# Patient Record
Sex: Male | Born: 1940 | Race: White | Hispanic: No | Marital: Married | Smoking: Former smoker
Health system: Southern US, Community
[De-identification: ages and names within clinical notes are randomized; demographics above are authoritative.]

## PROBLEM LIST (undated history)

## (undated) DIAGNOSIS — G8929 Other chronic pain: Secondary | ICD-10-CM

## (undated) DIAGNOSIS — R269 Unspecified abnormalities of gait and mobility: Secondary | ICD-10-CM

## (undated) DIAGNOSIS — G2581 Restless legs syndrome: Secondary | ICD-10-CM

## (undated) DIAGNOSIS — R413 Other amnesia: Secondary | ICD-10-CM

## (undated) DIAGNOSIS — I499 Cardiac arrhythmia, unspecified: Secondary | ICD-10-CM

## (undated) DIAGNOSIS — F0390 Unspecified dementia without behavioral disturbance: Secondary | ICD-10-CM

## (undated) DIAGNOSIS — E7800 Pure hypercholesterolemia, unspecified: Secondary | ICD-10-CM

## (undated) DIAGNOSIS — C9590 Leukemia, unspecified not having achieved remission: Secondary | ICD-10-CM

## (undated) DIAGNOSIS — K635 Polyp of colon: Secondary | ICD-10-CM

## (undated) DIAGNOSIS — I4891 Unspecified atrial fibrillation: Secondary | ICD-10-CM

## (undated) DIAGNOSIS — K219 Gastro-esophageal reflux disease without esophagitis: Secondary | ICD-10-CM

## (undated) DIAGNOSIS — J449 Chronic obstructive pulmonary disease, unspecified: Secondary | ICD-10-CM

## (undated) DIAGNOSIS — I10 Essential (primary) hypertension: Secondary | ICD-10-CM

## (undated) DIAGNOSIS — M545 Low back pain: Secondary | ICD-10-CM

## (undated) DIAGNOSIS — G629 Polyneuropathy, unspecified: Secondary | ICD-10-CM

## (undated) HISTORY — DX: Polyneuropathy, unspecified: G62.9

## (undated) HISTORY — PX: OTHER SURGICAL HISTORY: SHX169

## (undated) HISTORY — DX: Other chronic pain: G89.29

## (undated) HISTORY — DX: Low back pain: M54.5

## (undated) HISTORY — PX: HERNIA REPAIR: SHX51

## (undated) HISTORY — DX: Unspecified abnormalities of gait and mobility: R26.9

## (undated) HISTORY — DX: Unspecified atrial fibrillation: I48.91

## (undated) HISTORY — PX: COLONOSCOPY W/ BIOPSIES: SHX1374

## (undated) HISTORY — PX: APPENDECTOMY: SHX54

## (undated) HISTORY — DX: Restless legs syndrome: G25.81

## (undated) HISTORY — PX: CATARACT EXTRACTION: SUR2

## (undated) HISTORY — DX: Polyp of colon: K63.5

## (undated) HISTORY — PX: TONSILLECTOMY: SUR1361

## (undated) HISTORY — DX: Other amnesia: R41.3

---

## 1970-05-22 HISTORY — PX: OTHER SURGICAL HISTORY: SHX169

## 1988-05-22 HISTORY — PX: BONE MARROW TRANSPLANT: SHX200

## 1998-05-03 ENCOUNTER — Ambulatory Visit (HOSPITAL_BASED_OUTPATIENT_CLINIC_OR_DEPARTMENT_OTHER): Admission: RE | Admit: 1998-05-03 | Discharge: 1998-05-03 | Payer: Self-pay | Admitting: General Surgery

## 1999-09-22 ENCOUNTER — Encounter: Payer: Self-pay | Admitting: Oncology

## 1999-09-22 ENCOUNTER — Inpatient Hospital Stay (HOSPITAL_COMMUNITY): Admission: AD | Admit: 1999-09-22 | Discharge: 1999-09-27 | Payer: Self-pay | Admitting: Oncology

## 1999-09-22 ENCOUNTER — Encounter: Admission: RE | Admit: 1999-09-22 | Discharge: 1999-09-22 | Payer: Self-pay | Admitting: Oncology

## 1999-09-26 ENCOUNTER — Encounter: Payer: Self-pay | Admitting: Oncology

## 1999-11-06 ENCOUNTER — Encounter: Payer: Self-pay | Admitting: Emergency Medicine

## 1999-11-06 ENCOUNTER — Emergency Department (HOSPITAL_COMMUNITY): Admission: EM | Admit: 1999-11-06 | Discharge: 1999-11-06 | Payer: Self-pay | Admitting: Emergency Medicine

## 2000-01-01 ENCOUNTER — Inpatient Hospital Stay (HOSPITAL_COMMUNITY): Admission: EM | Admit: 2000-01-01 | Discharge: 2000-01-06 | Payer: Self-pay | Admitting: Emergency Medicine

## 2000-01-01 ENCOUNTER — Encounter: Payer: Self-pay | Admitting: Emergency Medicine

## 2000-01-02 ENCOUNTER — Encounter: Payer: Self-pay | Admitting: Oncology

## 2000-01-05 ENCOUNTER — Encounter: Payer: Self-pay | Admitting: Oncology

## 2000-01-18 ENCOUNTER — Ambulatory Visit (HOSPITAL_COMMUNITY): Admission: RE | Admit: 2000-01-18 | Discharge: 2000-01-18 | Payer: Self-pay | Admitting: Oncology

## 2000-01-18 ENCOUNTER — Encounter: Payer: Self-pay | Admitting: Emergency Medicine

## 2000-01-18 ENCOUNTER — Observation Stay (HOSPITAL_COMMUNITY): Admission: EM | Admit: 2000-01-18 | Discharge: 2000-01-20 | Payer: Self-pay | Admitting: Emergency Medicine

## 2000-01-18 ENCOUNTER — Encounter: Payer: Self-pay | Admitting: Oncology

## 2000-01-20 ENCOUNTER — Encounter: Payer: Self-pay | Admitting: Oncology

## 2000-02-02 ENCOUNTER — Encounter: Payer: Self-pay | Admitting: Oncology

## 2000-02-02 ENCOUNTER — Ambulatory Visit (HOSPITAL_COMMUNITY): Admission: RE | Admit: 2000-02-02 | Discharge: 2000-02-02 | Payer: Self-pay | Admitting: Oncology

## 2000-08-28 ENCOUNTER — Ambulatory Visit (HOSPITAL_COMMUNITY): Admission: RE | Admit: 2000-08-28 | Discharge: 2000-08-28 | Payer: Self-pay | Admitting: Oncology

## 2000-08-28 ENCOUNTER — Encounter: Payer: Self-pay | Admitting: Oncology

## 2000-12-05 ENCOUNTER — Ambulatory Visit (HOSPITAL_COMMUNITY): Admission: RE | Admit: 2000-12-05 | Discharge: 2000-12-05 | Payer: Self-pay | Admitting: Neurosurgery

## 2000-12-05 ENCOUNTER — Encounter: Payer: Self-pay | Admitting: Neurosurgery

## 2000-12-19 ENCOUNTER — Encounter: Admission: RE | Admit: 2000-12-19 | Discharge: 2000-12-19 | Payer: Self-pay | Admitting: Neurosurgery

## 2000-12-19 ENCOUNTER — Encounter: Payer: Self-pay | Admitting: Neurosurgery

## 2001-01-16 ENCOUNTER — Encounter: Payer: Self-pay | Admitting: Neurosurgery

## 2001-01-16 ENCOUNTER — Encounter: Admission: RE | Admit: 2001-01-16 | Discharge: 2001-01-16 | Payer: Self-pay | Admitting: Neurosurgery

## 2002-06-20 ENCOUNTER — Emergency Department (HOSPITAL_COMMUNITY): Admission: EM | Admit: 2002-06-20 | Discharge: 2002-06-20 | Payer: Self-pay

## 2002-11-27 ENCOUNTER — Ambulatory Visit (HOSPITAL_BASED_OUTPATIENT_CLINIC_OR_DEPARTMENT_OTHER): Admission: RE | Admit: 2002-11-27 | Discharge: 2002-11-27 | Payer: Self-pay | Admitting: General Surgery

## 2003-02-12 ENCOUNTER — Encounter: Payer: Self-pay | Admitting: Cardiology

## 2003-02-12 ENCOUNTER — Encounter: Admission: RE | Admit: 2003-02-12 | Discharge: 2003-02-12 | Payer: Self-pay | Admitting: Cardiology

## 2003-04-20 ENCOUNTER — Inpatient Hospital Stay (HOSPITAL_COMMUNITY): Admission: EM | Admit: 2003-04-20 | Discharge: 2003-04-24 | Payer: Self-pay | Admitting: Emergency Medicine

## 2004-08-29 ENCOUNTER — Ambulatory Visit: Payer: Self-pay | Admitting: Oncology

## 2010-09-20 ENCOUNTER — Other Ambulatory Visit: Payer: Self-pay | Admitting: Cardiology

## 2010-09-22 ENCOUNTER — Ambulatory Visit
Admission: RE | Admit: 2010-09-22 | Discharge: 2010-09-22 | Disposition: A | Discharge status: Home / Self Care | Payer: Medicare Other | Source: Ambulatory Visit | Attending: Cardiology | Admitting: Cardiology

## 2011-07-14 DIAGNOSIS — L723 Sebaceous cyst: Secondary | ICD-10-CM | POA: Diagnosis not present

## 2011-07-14 DIAGNOSIS — D239 Other benign neoplasm of skin, unspecified: Secondary | ICD-10-CM | POA: Diagnosis not present

## 2011-07-14 DIAGNOSIS — Z85828 Personal history of other malignant neoplasm of skin: Secondary | ICD-10-CM | POA: Diagnosis not present

## 2011-07-26 DIAGNOSIS — I10 Essential (primary) hypertension: Secondary | ICD-10-CM | POA: Diagnosis not present

## 2011-07-26 DIAGNOSIS — N401 Enlarged prostate with lower urinary tract symptoms: Secondary | ICD-10-CM | POA: Diagnosis not present

## 2011-07-26 DIAGNOSIS — M542 Cervicalgia: Secondary | ICD-10-CM | POA: Diagnosis not present

## 2011-07-26 DIAGNOSIS — R413 Other amnesia: Secondary | ICD-10-CM | POA: Diagnosis not present

## 2011-08-15 DIAGNOSIS — M542 Cervicalgia: Secondary | ICD-10-CM | POA: Diagnosis not present

## 2011-08-15 DIAGNOSIS — M5412 Radiculopathy, cervical region: Secondary | ICD-10-CM | POA: Diagnosis not present

## 2011-08-16 ENCOUNTER — Other Ambulatory Visit (HOSPITAL_COMMUNITY): Payer: Self-pay | Admitting: Neurosurgery

## 2011-08-16 ENCOUNTER — Other Ambulatory Visit: Payer: Self-pay | Admitting: Neurosurgery

## 2011-08-16 DIAGNOSIS — M542 Cervicalgia: Secondary | ICD-10-CM

## 2011-08-28 DIAGNOSIS — E785 Hyperlipidemia, unspecified: Secondary | ICD-10-CM | POA: Diagnosis not present

## 2011-08-28 DIAGNOSIS — I4891 Unspecified atrial fibrillation: Secondary | ICD-10-CM | POA: Diagnosis not present

## 2011-08-28 DIAGNOSIS — J449 Chronic obstructive pulmonary disease, unspecified: Secondary | ICD-10-CM | POA: Diagnosis not present

## 2011-08-28 DIAGNOSIS — I10 Essential (primary) hypertension: Secondary | ICD-10-CM | POA: Diagnosis not present

## 2011-08-28 DIAGNOSIS — M542 Cervicalgia: Secondary | ICD-10-CM | POA: Diagnosis not present

## 2011-08-30 ENCOUNTER — Ambulatory Visit (HOSPITAL_COMMUNITY)
Admission: RE | Admit: 2011-08-30 | Discharge: 2011-08-30 | Disposition: A | Discharge status: Home / Self Care | Payer: Medicare Other | Source: Ambulatory Visit | Attending: Neurosurgery | Admitting: Neurosurgery

## 2011-08-30 DIAGNOSIS — M5412 Radiculopathy, cervical region: Secondary | ICD-10-CM | POA: Diagnosis not present

## 2011-08-30 DIAGNOSIS — M542 Cervicalgia: Secondary | ICD-10-CM | POA: Diagnosis not present

## 2011-08-30 DIAGNOSIS — M47812 Spondylosis without myelopathy or radiculopathy, cervical region: Secondary | ICD-10-CM | POA: Insufficient documentation

## 2011-08-30 DIAGNOSIS — M5030 Other cervical disc degeneration, unspecified cervical region: Secondary | ICD-10-CM | POA: Diagnosis not present

## 2011-08-30 DIAGNOSIS — R209 Unspecified disturbances of skin sensation: Secondary | ICD-10-CM | POA: Diagnosis not present

## 2011-08-30 MED ORDER — IOHEXOL 300 MG/ML  SOLN
10.0000 mL | Freq: Once | INTRAMUSCULAR | Status: AC | PRN
  Administered 2011-08-30: 10 mL via INTRATHECAL

## 2011-08-30 MED ORDER — OXYCODONE-ACETAMINOPHEN 5-325 MG PO TABS
ORAL_TABLET | ORAL | Status: AC
  Filled 2011-08-30: qty 1

## 2011-08-30 MED ORDER — ONDANSETRON HCL 4 MG/2ML IJ SOLN: 4.0000 mg | Freq: Four times a day (QID) | INTRAMUSCULAR | Status: DC | PRN

## 2011-08-30 MED ORDER — DIAZEPAM 5 MG PO TABS
10.0000 mg | ORAL_TABLET | Freq: Once | ORAL | Status: AC
  Administered 2011-08-30: 10 mg via ORAL

## 2011-08-30 MED ORDER — OXYCODONE-ACETAMINOPHEN 5-325 MG PO TABS
1.0000 | ORAL_TABLET | ORAL | Status: DC | PRN
  Administered 2011-08-30: 1 via ORAL

## 2011-08-30 MED ORDER — DIAZEPAM 5 MG PO TABS
ORAL_TABLET | ORAL | Status: AC
  Filled 2011-08-30: qty 2

## 2011-08-30 MED ORDER — ALBUTEROL SULFATE HFA 108 (90 BASE) MCG/ACT IN AERS: 2.0000 | INHALATION_SPRAY | Freq: Four times a day (QID) | RESPIRATORY_TRACT | Status: DC | PRN
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
## 2011-08-30 NOTE — Discharge Instructions (Signed)
Myelography Care After These instructions give you information on caring for yourself after your procedure. Your doctor may also give you specific instructions. Call your doctor if you have any problems or questions after your procedure. HOME CARE  Lie down for 24 hours. Lie in any position with 1 pillow under your head.   For 24 hours, get up only to eat or use the bathroom. Take only 10 minutes to eat.   For 24 hours, drink enough fluids to keep your pee (urine) clear or pale yellow. No alcohol.   Take all medicine as told by your doctor.   Avoid heavy lifting and activity for 48 hours.   You may take the bandage off the day after your myelography.   Do not take a bath for 24 hours. Ask your doctor if it is okay to take a shower.  Finding out the results of your test Ask your doctor when your test results will be ready. Make sure you follow up and get the test results. GET HELP RIGHT AWAY IF:   Any of the places where the needles were put in:   Are puffy (swollen) or red.   Are sore or hot to the touch.   Are draining yellowish-white fluid (pus).   Are bleeding after 10 minutes of pressing down on the site. Have someone press on any place that is bleeding until seen by a doctor.   You have a lasting headache that is not helped by medicine.   You have a bad headache with a stiff neck or fever.   You have trouble breathing.   You feel sick to your stomach (nauseous) or throw up (vomit).   You have pain or cramping in your belly (abdomen).   You have a fever.  If you go to the emergency room, tell the doctor you had a myelogram. Take this paper with you to show the doctor. MAKE SURE YOU:  Understand these instructions.   Will watch your condition.   Will get help right away if you are not doing well or get worse.  Document Released: 02/15/2008 Document Revised: 04/27/2011 Document Reviewed: 02/15/2008 Exit[employer] Patient Information 2012 [employer]. 

## 2011-08-31 ENCOUNTER — Telehealth (HOSPITAL_COMMUNITY): Payer: Self-pay

## 2011-09-07 DIAGNOSIS — M542 Cervicalgia: Secondary | ICD-10-CM | POA: Diagnosis not present

## 2011-09-15 DIAGNOSIS — M7520 Bicipital tendinitis, unspecified shoulder: Secondary | ICD-10-CM | POA: Diagnosis not present

## 2011-09-19 DIAGNOSIS — E785 Hyperlipidemia, unspecified: Secondary | ICD-10-CM | POA: Diagnosis not present

## 2011-09-19 DIAGNOSIS — I4891 Unspecified atrial fibrillation: Secondary | ICD-10-CM | POA: Diagnosis not present

## 2011-09-19 DIAGNOSIS — I2510 Atherosclerotic heart disease of native coronary artery without angina pectoris: Secondary | ICD-10-CM | POA: Diagnosis not present

## 2011-09-19 DIAGNOSIS — R0789 Other chest pain: Secondary | ICD-10-CM | POA: Diagnosis not present

## 2011-10-07 ENCOUNTER — Other Ambulatory Visit: Payer: Self-pay | Admitting: Cardiology

## 2011-10-09 DIAGNOSIS — M7520 Bicipital tendinitis, unspecified shoulder: Secondary | ICD-10-CM | POA: Diagnosis not present

## 2011-10-10 DIAGNOSIS — R05 Cough: Secondary | ICD-10-CM | POA: Diagnosis not present

## 2011-10-10 DIAGNOSIS — K219 Gastro-esophageal reflux disease without esophagitis: Secondary | ICD-10-CM | POA: Diagnosis not present

## 2011-10-10 DIAGNOSIS — I10 Essential (primary) hypertension: Secondary | ICD-10-CM | POA: Diagnosis not present

## 2011-10-10 DIAGNOSIS — J441 Chronic obstructive pulmonary disease with (acute) exacerbation: Secondary | ICD-10-CM | POA: Diagnosis not present

## 2011-10-20 DIAGNOSIS — M5412 Radiculopathy, cervical region: Secondary | ICD-10-CM | POA: Diagnosis not present

## 2011-11-02 DIAGNOSIS — H01009 Unspecified blepharitis unspecified eye, unspecified eyelid: Secondary | ICD-10-CM | POA: Diagnosis not present

## 2011-11-02 DIAGNOSIS — H31009 Unspecified chorioretinal scars, unspecified eye: Secondary | ICD-10-CM | POA: Diagnosis not present

## 2011-11-02 DIAGNOSIS — H3530 Unspecified macular degeneration: Secondary | ICD-10-CM | POA: Diagnosis not present

## 2011-11-02 DIAGNOSIS — H52209 Unspecified astigmatism, unspecified eye: Secondary | ICD-10-CM | POA: Diagnosis not present

## 2011-11-03 DIAGNOSIS — J441 Chronic obstructive pulmonary disease with (acute) exacerbation: Secondary | ICD-10-CM | POA: Diagnosis not present

## 2011-11-03 DIAGNOSIS — B029 Zoster without complications: Secondary | ICD-10-CM | POA: Diagnosis not present

## 2011-11-03 DIAGNOSIS — B0229 Other postherpetic nervous system involvement: Secondary | ICD-10-CM | POA: Diagnosis not present

## 2011-11-03 DIAGNOSIS — I10 Essential (primary) hypertension: Secondary | ICD-10-CM | POA: Diagnosis not present

## 2011-11-08 DIAGNOSIS — M79609 Pain in unspecified limb: Secondary | ICD-10-CM | POA: Diagnosis not present

## 2011-11-14 DIAGNOSIS — M25539 Pain in unspecified wrist: Secondary | ICD-10-CM | POA: Diagnosis not present

## 2012-01-17 DIAGNOSIS — I10 Essential (primary) hypertension: Secondary | ICD-10-CM | POA: Diagnosis not present

## 2012-01-17 DIAGNOSIS — J449 Chronic obstructive pulmonary disease, unspecified: Secondary | ICD-10-CM | POA: Diagnosis not present

## 2012-01-17 DIAGNOSIS — E785 Hyperlipidemia, unspecified: Secondary | ICD-10-CM | POA: Diagnosis not present

## 2012-01-17 DIAGNOSIS — N401 Enlarged prostate with lower urinary tract symptoms: Secondary | ICD-10-CM | POA: Diagnosis not present

## 2012-03-19 DIAGNOSIS — Z23 Encounter for immunization: Secondary | ICD-10-CM | POA: Diagnosis not present

## 2012-03-25 DIAGNOSIS — M542 Cervicalgia: Secondary | ICD-10-CM | POA: Diagnosis not present

## 2012-03-27 DIAGNOSIS — G5690 Unspecified mononeuropathy of unspecified upper limb: Secondary | ICD-10-CM | POA: Diagnosis not present

## 2012-04-02 DIAGNOSIS — M47812 Spondylosis without myelopathy or radiculopathy, cervical region: Secondary | ICD-10-CM | POA: Diagnosis not present

## 2012-04-02 DIAGNOSIS — M542 Cervicalgia: Secondary | ICD-10-CM | POA: Diagnosis not present

## 2012-04-10 DIAGNOSIS — M79609 Pain in unspecified limb: Secondary | ICD-10-CM | POA: Diagnosis not present

## 2012-04-12 DIAGNOSIS — M79609 Pain in unspecified limb: Secondary | ICD-10-CM | POA: Diagnosis not present

## 2012-04-12 DIAGNOSIS — M25519 Pain in unspecified shoulder: Secondary | ICD-10-CM | POA: Diagnosis not present

## 2012-04-24 DIAGNOSIS — E785 Hyperlipidemia, unspecified: Secondary | ICD-10-CM | POA: Diagnosis not present

## 2012-04-24 DIAGNOSIS — Z125 Encounter for screening for malignant neoplasm of prostate: Secondary | ICD-10-CM | POA: Diagnosis not present

## 2012-04-24 DIAGNOSIS — I10 Essential (primary) hypertension: Secondary | ICD-10-CM | POA: Diagnosis not present

## 2012-05-01 DIAGNOSIS — Z1212 Encounter for screening for malignant neoplasm of rectum: Secondary | ICD-10-CM | POA: Diagnosis not present

## 2012-05-01 DIAGNOSIS — J449 Chronic obstructive pulmonary disease, unspecified: Secondary | ICD-10-CM | POA: Diagnosis not present

## 2012-05-01 DIAGNOSIS — E785 Hyperlipidemia, unspecified: Secondary | ICD-10-CM | POA: Diagnosis not present

## 2012-05-01 DIAGNOSIS — I10 Essential (primary) hypertension: Secondary | ICD-10-CM | POA: Diagnosis not present

## 2012-05-01 DIAGNOSIS — Z0000 Encounter for general adult medical examination without abnormal findings: Secondary | ICD-10-CM | POA: Diagnosis not present

## 2012-05-01 DIAGNOSIS — Z125 Encounter for screening for malignant neoplasm of prostate: Secondary | ICD-10-CM | POA: Diagnosis not present

## 2012-05-03 DIAGNOSIS — M25519 Pain in unspecified shoulder: Secondary | ICD-10-CM | POA: Diagnosis not present

## 2012-05-03 DIAGNOSIS — M79609 Pain in unspecified limb: Secondary | ICD-10-CM | POA: Diagnosis not present

## 2012-05-29 DIAGNOSIS — N400 Enlarged prostate without lower urinary tract symptoms: Secondary | ICD-10-CM | POA: Diagnosis not present

## 2012-05-29 DIAGNOSIS — R350 Frequency of micturition: Secondary | ICD-10-CM | POA: Diagnosis not present

## 2012-05-29 DIAGNOSIS — N529 Male erectile dysfunction, unspecified: Secondary | ICD-10-CM | POA: Diagnosis not present

## 2012-07-08 DIAGNOSIS — L723 Sebaceous cyst: Secondary | ICD-10-CM | POA: Diagnosis not present

## 2012-07-08 DIAGNOSIS — L02219 Cutaneous abscess of trunk, unspecified: Secondary | ICD-10-CM | POA: Diagnosis not present

## 2012-07-08 DIAGNOSIS — L821 Other seborrheic keratosis: Secondary | ICD-10-CM | POA: Diagnosis not present

## 2012-07-08 DIAGNOSIS — D235 Other benign neoplasm of skin of trunk: Secondary | ICD-10-CM | POA: Diagnosis not present

## 2012-07-08 DIAGNOSIS — L578 Other skin changes due to chronic exposure to nonionizing radiation: Secondary | ICD-10-CM | POA: Diagnosis not present

## 2012-07-08 DIAGNOSIS — L820 Inflamed seborrheic keratosis: Secondary | ICD-10-CM | POA: Diagnosis not present

## 2012-07-08 DIAGNOSIS — D1801 Hemangioma of skin and subcutaneous tissue: Secondary | ICD-10-CM | POA: Diagnosis not present

## 2012-07-08 DIAGNOSIS — L819 Disorder of pigmentation, unspecified: Secondary | ICD-10-CM | POA: Diagnosis not present

## 2012-08-02 DIAGNOSIS — H6120 Impacted cerumen, unspecified ear: Secondary | ICD-10-CM | POA: Diagnosis not present

## 2012-08-02 DIAGNOSIS — H902 Conductive hearing loss, unspecified: Secondary | ICD-10-CM | POA: Diagnosis not present

## 2012-08-16 DIAGNOSIS — L821 Other seborrheic keratosis: Secondary | ICD-10-CM | POA: Diagnosis not present

## 2012-08-16 DIAGNOSIS — L910 Hypertrophic scar: Secondary | ICD-10-CM | POA: Diagnosis not present

## 2012-08-16 DIAGNOSIS — L578 Other skin changes due to chronic exposure to nonionizing radiation: Secondary | ICD-10-CM | POA: Diagnosis not present

## 2012-08-16 DIAGNOSIS — L723 Sebaceous cyst: Secondary | ICD-10-CM | POA: Diagnosis not present

## 2012-09-17 ENCOUNTER — Other Ambulatory Visit: Payer: Self-pay | Admitting: Cardiology

## 2012-09-17 ENCOUNTER — Ambulatory Visit
Admission: RE | Admit: 2012-09-17 | Discharge: 2012-09-17 | Disposition: A | Discharge status: Home / Self Care | Payer: Medicare Other | Source: Ambulatory Visit | Attending: Cardiology | Admitting: Cardiology

## 2012-09-17 DIAGNOSIS — E785 Hyperlipidemia, unspecified: Secondary | ICD-10-CM | POA: Diagnosis not present

## 2012-09-17 DIAGNOSIS — R0609 Other forms of dyspnea: Secondary | ICD-10-CM | POA: Diagnosis not present

## 2012-09-17 DIAGNOSIS — R0602 Shortness of breath: Secondary | ICD-10-CM

## 2012-09-17 DIAGNOSIS — R0989 Other specified symptoms and signs involving the circulatory and respiratory systems: Secondary | ICD-10-CM | POA: Diagnosis not present

## 2012-09-17 DIAGNOSIS — I4891 Unspecified atrial fibrillation: Secondary | ICD-10-CM | POA: Diagnosis not present

## 2012-09-17 DIAGNOSIS — I2510 Atherosclerotic heart disease of native coronary artery without angina pectoris: Secondary | ICD-10-CM | POA: Diagnosis not present

## 2012-09-19 DIAGNOSIS — I2510 Atherosclerotic heart disease of native coronary artery without angina pectoris: Secondary | ICD-10-CM | POA: Diagnosis not present

## 2012-09-19 DIAGNOSIS — I4891 Unspecified atrial fibrillation: Secondary | ICD-10-CM | POA: Diagnosis not present

## 2012-09-19 DIAGNOSIS — R0609 Other forms of dyspnea: Secondary | ICD-10-CM | POA: Diagnosis not present

## 2012-09-19 DIAGNOSIS — R0602 Shortness of breath: Secondary | ICD-10-CM | POA: Diagnosis not present

## 2012-09-19 DIAGNOSIS — E785 Hyperlipidemia, unspecified: Secondary | ICD-10-CM | POA: Diagnosis not present

## 2012-09-25 DIAGNOSIS — N400 Enlarged prostate without lower urinary tract symptoms: Secondary | ICD-10-CM | POA: Diagnosis not present

## 2012-09-25 DIAGNOSIS — N529 Male erectile dysfunction, unspecified: Secondary | ICD-10-CM | POA: Diagnosis not present

## 2012-09-25 DIAGNOSIS — R350 Frequency of micturition: Secondary | ICD-10-CM | POA: Diagnosis not present

## 2012-10-09 DIAGNOSIS — R0602 Shortness of breath: Secondary | ICD-10-CM | POA: Diagnosis not present

## 2012-10-30 DIAGNOSIS — N529 Male erectile dysfunction, unspecified: Secondary | ICD-10-CM | POA: Diagnosis not present

## 2012-10-30 DIAGNOSIS — J449 Chronic obstructive pulmonary disease, unspecified: Secondary | ICD-10-CM | POA: Diagnosis not present

## 2012-10-30 DIAGNOSIS — N401 Enlarged prostate with lower urinary tract symptoms: Secondary | ICD-10-CM | POA: Diagnosis not present

## 2012-10-30 DIAGNOSIS — K219 Gastro-esophageal reflux disease without esophagitis: Secondary | ICD-10-CM | POA: Diagnosis not present

## 2012-10-30 DIAGNOSIS — C9211 Chronic myeloid leukemia, BCR/ABL-positive, in remission: Secondary | ICD-10-CM | POA: Diagnosis not present

## 2012-10-30 DIAGNOSIS — Z1331 Encounter for screening for depression: Secondary | ICD-10-CM | POA: Diagnosis not present

## 2012-10-30 DIAGNOSIS — I10 Essential (primary) hypertension: Secondary | ICD-10-CM | POA: Diagnosis not present

## 2012-10-30 DIAGNOSIS — E785 Hyperlipidemia, unspecified: Secondary | ICD-10-CM | POA: Diagnosis not present

## 2012-10-30 DIAGNOSIS — R0602 Shortness of breath: Secondary | ICD-10-CM | POA: Diagnosis not present

## 2012-11-04 DIAGNOSIS — H01009 Unspecified blepharitis unspecified eye, unspecified eyelid: Secondary | ICD-10-CM | POA: Diagnosis not present

## 2012-11-04 DIAGNOSIS — Z961 Presence of intraocular lens: Secondary | ICD-10-CM | POA: Diagnosis not present

## 2012-11-04 DIAGNOSIS — H31009 Unspecified chorioretinal scars, unspecified eye: Secondary | ICD-10-CM | POA: Diagnosis not present

## 2012-11-04 DIAGNOSIS — H3530 Unspecified macular degeneration: Secondary | ICD-10-CM | POA: Diagnosis not present

## 2013-01-22 DIAGNOSIS — N400 Enlarged prostate without lower urinary tract symptoms: Secondary | ICD-10-CM | POA: Diagnosis not present

## 2013-01-22 DIAGNOSIS — N529 Male erectile dysfunction, unspecified: Secondary | ICD-10-CM | POA: Diagnosis not present

## 2013-01-22 DIAGNOSIS — R350 Frequency of micturition: Secondary | ICD-10-CM | POA: Diagnosis not present

## 2013-02-07 DIAGNOSIS — I10 Essential (primary) hypertension: Secondary | ICD-10-CM | POA: Diagnosis not present

## 2013-02-07 DIAGNOSIS — N401 Enlarged prostate with lower urinary tract symptoms: Secondary | ICD-10-CM | POA: Diagnosis not present

## 2013-02-07 DIAGNOSIS — Z23 Encounter for immunization: Secondary | ICD-10-CM | POA: Diagnosis not present

## 2013-02-07 DIAGNOSIS — E785 Hyperlipidemia, unspecified: Secondary | ICD-10-CM | POA: Diagnosis not present

## 2013-02-07 DIAGNOSIS — J449 Chronic obstructive pulmonary disease, unspecified: Secondary | ICD-10-CM | POA: Diagnosis not present

## 2013-02-07 DIAGNOSIS — N529 Male erectile dysfunction, unspecified: Secondary | ICD-10-CM | POA: Diagnosis not present

## 2013-02-07 DIAGNOSIS — C9211 Chronic myeloid leukemia, BCR/ABL-positive, in remission: Secondary | ICD-10-CM | POA: Diagnosis not present

## 2013-02-18 DIAGNOSIS — M47812 Spondylosis without myelopathy or radiculopathy, cervical region: Secondary | ICD-10-CM | POA: Diagnosis not present

## 2013-02-18 DIAGNOSIS — M542 Cervicalgia: Secondary | ICD-10-CM | POA: Diagnosis not present

## 2013-02-27 DIAGNOSIS — IMO0001 Reserved for inherently not codable concepts without codable children: Secondary | ICD-10-CM | POA: Diagnosis not present

## 2013-02-27 DIAGNOSIS — M47812 Spondylosis without myelopathy or radiculopathy, cervical region: Secondary | ICD-10-CM | POA: Diagnosis not present

## 2013-02-27 DIAGNOSIS — M542 Cervicalgia: Secondary | ICD-10-CM | POA: Diagnosis not present

## 2013-04-03 DIAGNOSIS — Z6825 Body mass index (BMI) 25.0-25.9, adult: Secondary | ICD-10-CM | POA: Diagnosis not present

## 2013-04-03 DIAGNOSIS — J029 Acute pharyngitis, unspecified: Secondary | ICD-10-CM | POA: Diagnosis not present

## 2013-04-03 DIAGNOSIS — J449 Chronic obstructive pulmonary disease, unspecified: Secondary | ICD-10-CM | POA: Diagnosis not present

## 2013-04-03 DIAGNOSIS — J111 Influenza due to unidentified influenza virus with other respiratory manifestations: Secondary | ICD-10-CM | POA: Diagnosis not present

## 2013-04-14 DIAGNOSIS — IMO0002 Reserved for concepts with insufficient information to code with codable children: Secondary | ICD-10-CM | POA: Diagnosis not present

## 2013-04-14 DIAGNOSIS — J449 Chronic obstructive pulmonary disease, unspecified: Secondary | ICD-10-CM | POA: Diagnosis not present

## 2013-04-14 DIAGNOSIS — I10 Essential (primary) hypertension: Secondary | ICD-10-CM | POA: Diagnosis not present

## 2013-04-14 DIAGNOSIS — J111 Influenza due to unidentified influenza virus with other respiratory manifestations: Secondary | ICD-10-CM | POA: Diagnosis not present

## 2013-04-14 DIAGNOSIS — R05 Cough: Secondary | ICD-10-CM | POA: Diagnosis not present

## 2013-04-14 DIAGNOSIS — R0602 Shortness of breath: Secondary | ICD-10-CM | POA: Diagnosis not present

## 2013-04-14 DIAGNOSIS — J321 Chronic frontal sinusitis: Secondary | ICD-10-CM | POA: Diagnosis not present

## 2013-04-30 DIAGNOSIS — Z125 Encounter for screening for malignant neoplasm of prostate: Secondary | ICD-10-CM | POA: Diagnosis not present

## 2013-04-30 DIAGNOSIS — I10 Essential (primary) hypertension: Secondary | ICD-10-CM | POA: Diagnosis not present

## 2013-04-30 DIAGNOSIS — E785 Hyperlipidemia, unspecified: Secondary | ICD-10-CM | POA: Diagnosis not present

## 2013-05-06 DIAGNOSIS — Z125 Encounter for screening for malignant neoplasm of prostate: Secondary | ICD-10-CM | POA: Diagnosis not present

## 2013-05-06 DIAGNOSIS — Z0000 Encounter for general adult medical examination without abnormal findings: Secondary | ICD-10-CM | POA: Diagnosis not present

## 2013-05-06 DIAGNOSIS — Z23 Encounter for immunization: Secondary | ICD-10-CM | POA: Diagnosis not present

## 2013-05-06 DIAGNOSIS — J449 Chronic obstructive pulmonary disease, unspecified: Secondary | ICD-10-CM | POA: Diagnosis not present

## 2013-05-06 DIAGNOSIS — C9211 Chronic myeloid leukemia, BCR/ABL-positive, in remission: Secondary | ICD-10-CM | POA: Diagnosis not present

## 2013-05-06 DIAGNOSIS — N401 Enlarged prostate with lower urinary tract symptoms: Secondary | ICD-10-CM | POA: Diagnosis not present

## 2013-05-06 DIAGNOSIS — N529 Male erectile dysfunction, unspecified: Secondary | ICD-10-CM | POA: Diagnosis not present

## 2013-05-06 DIAGNOSIS — E785 Hyperlipidemia, unspecified: Secondary | ICD-10-CM | POA: Diagnosis not present

## 2013-05-06 DIAGNOSIS — K219 Gastro-esophageal reflux disease without esophagitis: Secondary | ICD-10-CM | POA: Diagnosis not present

## 2013-05-06 DIAGNOSIS — I10 Essential (primary) hypertension: Secondary | ICD-10-CM | POA: Diagnosis not present

## 2013-05-07 DIAGNOSIS — Z1212 Encounter for screening for malignant neoplasm of rectum: Secondary | ICD-10-CM | POA: Diagnosis not present

## 2013-06-13 DIAGNOSIS — IMO0002 Reserved for concepts with insufficient information to code with codable children: Secondary | ICD-10-CM | POA: Diagnosis not present

## 2013-06-13 DIAGNOSIS — M545 Low back pain, unspecified: Secondary | ICD-10-CM | POA: Diagnosis not present

## 2013-06-16 DIAGNOSIS — J441 Chronic obstructive pulmonary disease with (acute) exacerbation: Secondary | ICD-10-CM | POA: Diagnosis not present

## 2013-06-16 DIAGNOSIS — R05 Cough: Secondary | ICD-10-CM | POA: Diagnosis not present

## 2013-06-16 DIAGNOSIS — R059 Cough, unspecified: Secondary | ICD-10-CM | POA: Diagnosis not present

## 2013-06-21 DIAGNOSIS — M47817 Spondylosis without myelopathy or radiculopathy, lumbosacral region: Secondary | ICD-10-CM | POA: Diagnosis not present

## 2013-06-23 DIAGNOSIS — M48061 Spinal stenosis, lumbar region without neurogenic claudication: Secondary | ICD-10-CM | POA: Diagnosis not present

## 2013-07-02 DIAGNOSIS — IMO0002 Reserved for concepts with insufficient information to code with codable children: Secondary | ICD-10-CM | POA: Diagnosis not present

## 2013-07-02 DIAGNOSIS — M47817 Spondylosis without myelopathy or radiculopathy, lumbosacral region: Secondary | ICD-10-CM | POA: Diagnosis not present

## 2013-08-06 DIAGNOSIS — J449 Chronic obstructive pulmonary disease, unspecified: Secondary | ICD-10-CM | POA: Diagnosis not present

## 2013-08-06 DIAGNOSIS — I10 Essential (primary) hypertension: Secondary | ICD-10-CM | POA: Diagnosis not present

## 2013-08-06 DIAGNOSIS — Z79899 Other long term (current) drug therapy: Secondary | ICD-10-CM | POA: Diagnosis not present

## 2013-08-06 DIAGNOSIS — K219 Gastro-esophageal reflux disease without esophagitis: Secondary | ICD-10-CM | POA: Diagnosis not present

## 2013-08-06 DIAGNOSIS — C9211 Chronic myeloid leukemia, BCR/ABL-positive, in remission: Secondary | ICD-10-CM | POA: Diagnosis not present

## 2013-08-06 DIAGNOSIS — Z1331 Encounter for screening for depression: Secondary | ICD-10-CM | POA: Diagnosis not present

## 2013-08-06 DIAGNOSIS — R413 Other amnesia: Secondary | ICD-10-CM | POA: Diagnosis not present

## 2013-08-06 DIAGNOSIS — N401 Enlarged prostate with lower urinary tract symptoms: Secondary | ICD-10-CM | POA: Diagnosis not present

## 2013-08-06 DIAGNOSIS — E785 Hyperlipidemia, unspecified: Secondary | ICD-10-CM | POA: Diagnosis not present

## 2013-08-06 DIAGNOSIS — M549 Dorsalgia, unspecified: Secondary | ICD-10-CM | POA: Diagnosis not present

## 2013-08-13 DIAGNOSIS — R350 Frequency of micturition: Secondary | ICD-10-CM | POA: Diagnosis not present

## 2013-08-13 DIAGNOSIS — N400 Enlarged prostate without lower urinary tract symptoms: Secondary | ICD-10-CM | POA: Diagnosis not present

## 2013-08-13 DIAGNOSIS — N529 Male erectile dysfunction, unspecified: Secondary | ICD-10-CM | POA: Diagnosis not present

## 2013-09-04 DIAGNOSIS — I2510 Atherosclerotic heart disease of native coronary artery without angina pectoris: Secondary | ICD-10-CM | POA: Diagnosis not present

## 2013-09-04 DIAGNOSIS — R0609 Other forms of dyspnea: Secondary | ICD-10-CM | POA: Diagnosis not present

## 2013-09-04 DIAGNOSIS — I4891 Unspecified atrial fibrillation: Secondary | ICD-10-CM | POA: Diagnosis not present

## 2013-09-04 DIAGNOSIS — E785 Hyperlipidemia, unspecified: Secondary | ICD-10-CM | POA: Diagnosis not present

## 2013-09-04 DIAGNOSIS — R0602 Shortness of breath: Secondary | ICD-10-CM | POA: Diagnosis not present

## 2013-10-15 DIAGNOSIS — M549 Dorsalgia, unspecified: Secondary | ICD-10-CM | POA: Diagnosis not present

## 2013-10-15 DIAGNOSIS — IMO0002 Reserved for concepts with insufficient information to code with codable children: Secondary | ICD-10-CM | POA: Diagnosis not present

## 2013-10-15 DIAGNOSIS — M169 Osteoarthritis of hip, unspecified: Secondary | ICD-10-CM | POA: Diagnosis not present

## 2013-10-15 DIAGNOSIS — S79919A Unspecified injury of unspecified hip, initial encounter: Secondary | ICD-10-CM | POA: Diagnosis not present

## 2013-10-15 DIAGNOSIS — M5137 Other intervertebral disc degeneration, lumbosacral region: Secondary | ICD-10-CM | POA: Diagnosis not present

## 2013-10-15 DIAGNOSIS — M949 Disorder of cartilage, unspecified: Secondary | ICD-10-CM | POA: Diagnosis not present

## 2013-10-15 DIAGNOSIS — M899 Disorder of bone, unspecified: Secondary | ICD-10-CM | POA: Diagnosis not present

## 2013-10-15 DIAGNOSIS — Z6825 Body mass index (BMI) 25.0-25.9, adult: Secondary | ICD-10-CM | POA: Diagnosis not present

## 2013-10-15 DIAGNOSIS — M1610 Unilateral primary osteoarthritis, unspecified hip: Secondary | ICD-10-CM | POA: Diagnosis not present

## 2013-11-10 DIAGNOSIS — Z961 Presence of intraocular lens: Secondary | ICD-10-CM | POA: Diagnosis not present

## 2013-11-10 DIAGNOSIS — H01009 Unspecified blepharitis unspecified eye, unspecified eyelid: Secondary | ICD-10-CM | POA: Diagnosis not present

## 2013-11-10 DIAGNOSIS — H31009 Unspecified chorioretinal scars, unspecified eye: Secondary | ICD-10-CM | POA: Diagnosis not present

## 2013-11-10 DIAGNOSIS — H3530 Unspecified macular degeneration: Secondary | ICD-10-CM | POA: Diagnosis not present

## 2013-11-11 ENCOUNTER — Encounter (HOSPITAL_COMMUNITY): Payer: Self-pay | Admitting: Emergency Medicine

## 2013-11-11 ENCOUNTER — Observation Stay (HOSPITAL_COMMUNITY)
Admission: EM | Admit: 2013-11-11 | Discharge: 2013-11-14 | Disposition: A | Discharge status: Home / Self Care | Payer: Medicare Other | Attending: Internal Medicine | Admitting: Internal Medicine

## 2013-11-11 ENCOUNTER — Emergency Department (HOSPITAL_COMMUNITY): Payer: Medicare Other

## 2013-11-11 DIAGNOSIS — K219 Gastro-esophageal reflux disease without esophagitis: Secondary | ICD-10-CM | POA: Insufficient documentation

## 2013-11-11 DIAGNOSIS — R270 Ataxia, unspecified: Secondary | ICD-10-CM

## 2013-11-11 DIAGNOSIS — Z79899 Other long term (current) drug therapy: Secondary | ICD-10-CM | POA: Diagnosis not present

## 2013-11-11 DIAGNOSIS — C9590 Leukemia, unspecified not having achieved remission: Secondary | ICD-10-CM | POA: Diagnosis not present

## 2013-11-11 DIAGNOSIS — E538 Deficiency of other specified B group vitamins: Secondary | ICD-10-CM | POA: Diagnosis present

## 2013-11-11 DIAGNOSIS — I10 Essential (primary) hypertension: Secondary | ICD-10-CM | POA: Insufficient documentation

## 2013-11-11 DIAGNOSIS — R269 Unspecified abnormalities of gait and mobility: Secondary | ICD-10-CM | POA: Diagnosis not present

## 2013-11-11 DIAGNOSIS — R5381 Other malaise: Secondary | ICD-10-CM | POA: Diagnosis not present

## 2013-11-11 DIAGNOSIS — J449 Chronic obstructive pulmonary disease, unspecified: Secondary | ICD-10-CM | POA: Diagnosis not present

## 2013-11-11 DIAGNOSIS — J4489 Other specified chronic obstructive pulmonary disease: Secondary | ICD-10-CM | POA: Insufficient documentation

## 2013-11-11 DIAGNOSIS — R5383 Other fatigue: Secondary | ICD-10-CM | POA: Diagnosis not present

## 2013-11-11 DIAGNOSIS — E7800 Pure hypercholesterolemia, unspecified: Secondary | ICD-10-CM | POA: Diagnosis not present

## 2013-11-11 DIAGNOSIS — Z87891 Personal history of nicotine dependence: Secondary | ICD-10-CM | POA: Insufficient documentation

## 2013-11-11 DIAGNOSIS — I499 Cardiac arrhythmia, unspecified: Secondary | ICD-10-CM | POA: Diagnosis not present

## 2013-11-11 DIAGNOSIS — G4700 Insomnia, unspecified: Secondary | ICD-10-CM | POA: Diagnosis present

## 2013-11-11 DIAGNOSIS — R42 Dizziness and giddiness: Secondary | ICD-10-CM

## 2013-11-11 DIAGNOSIS — I4891 Unspecified atrial fibrillation: Secondary | ICD-10-CM

## 2013-11-11 DIAGNOSIS — I482 Chronic atrial fibrillation, unspecified: Secondary | ICD-10-CM | POA: Diagnosis present

## 2013-11-11 DIAGNOSIS — R279 Unspecified lack of coordination: Secondary | ICD-10-CM | POA: Diagnosis not present

## 2013-11-11 HISTORY — DX: Chronic obstructive pulmonary disease, unspecified: J44.9

## 2013-11-11 HISTORY — DX: Pure hypercholesterolemia, unspecified: E78.00

## 2013-11-11 HISTORY — DX: Gastro-esophageal reflux disease without esophagitis: K21.9

## 2013-11-11 HISTORY — DX: Leukemia, unspecified not having achieved remission: C95.90

## 2013-11-11 HISTORY — DX: Essential (primary) hypertension: I10

## 2013-11-11 LAB — CBC WITH DIFFERENTIAL/PLATELET
BASOS ABS: 0 10*3/uL (ref 0.0–0.1)
BASOS PCT: 0 % (ref 0–1)
EOS PCT: 0 % (ref 0–5)
Eosinophils Absolute: 0 10*3/uL (ref 0.0–0.7)
HCT: 45.6 % (ref 39.0–52.0)
Hemoglobin: 15.6 g/dL (ref 13.0–17.0)
LYMPHS PCT: 8 % — AB (ref 12–46)
Lymphs Abs: 0.4 10*3/uL — ABNORMAL LOW (ref 0.7–4.0)
MCH: 32.6 pg (ref 26.0–34.0)
MCHC: 34.2 g/dL (ref 30.0–36.0)
MCV: 95.2 fL (ref 78.0–100.0)
Monocytes Absolute: 1 10*3/uL (ref 0.1–1.0)
Monocytes Relative: 17 % — ABNORMAL HIGH (ref 3–12)
Neutro Abs: 4.4 10*3/uL (ref 1.7–7.7)
Neutrophils Relative %: 75 % (ref 43–77)
PLATELETS: 149 10*3/uL — AB (ref 150–400)
RBC: 4.79 MIL/uL (ref 4.22–5.81)
RDW: 12.2 % (ref 11.5–15.5)
WBC: 5.9 10*3/uL (ref 4.0–10.5)

## 2013-11-11 LAB — COMPREHENSIVE METABOLIC PANEL
ALBUMIN: 4.1 g/dL (ref 3.5–5.2)
ALT: 19 U/L (ref 0–53)
AST: 24 U/L (ref 0–37)
Alkaline Phosphatase: 87 U/L (ref 39–117)
BUN: 14 mg/dL (ref 6–23)
CALCIUM: 10 mg/dL (ref 8.4–10.5)
CO2: 29 meq/L (ref 19–32)
Chloride: 97 mEq/L (ref 96–112)
Creatinine, Ser: 1.19 mg/dL (ref 0.50–1.35)
GFR calc Af Amer: 68 mL/min — ABNORMAL LOW (ref >90)
GFR calc non Af Amer: 59 mL/min — ABNORMAL LOW (ref >90)
Glucose, Bld: 117 mg/dL — ABNORMAL HIGH (ref 70–99)
Potassium: 4.7 mEq/L (ref 3.7–5.3)
SODIUM: 138 meq/L (ref 137–147)
Total Bilirubin: 0.6 mg/dL (ref 0.3–1.2)
Total Protein: 6.9 g/dL (ref 6.0–8.3)

## 2013-11-11 LAB — I-STAT TROPONIN, ED: Troponin i, poc: 0 ng/mL (ref 0.00–0.08)

## 2013-11-11 LAB — CBG MONITORING, ED: GLUCOSE-CAPILLARY: 121 mg/dL — AB (ref 70–99)

## 2013-11-11 NOTE — ED Notes (Signed)
Per patient's family patient has been falling 3 times today. Pt states that he has been stumbling ever since a fall off the ladder 2 weeks ago. Pt states also having abdominal pain around the umbilicus. Pt alert and oriented and able to follow commands and move extremities.

## 2013-11-11 NOTE — ED Provider Notes (Signed)
CSN: [id number]     Arrival date & time 11/11/13  2110 History   First MD Initiated Contact with Patient 11/11/13 2253     Chief Complaint  Patient presents with  . Weakness     (Consider location/radiation/quality/duration/timing/severity/associated sxs/prior Treatment) Patient is a 73 y.o. male presenting with neurologic complaint. The history is provided by the patient.  Neurologic Problem This is a recurrent problem. The current episode started yesterday. The problem occurs constantly. The problem has not changed since onset.Pertinent negatives include no chest pain, no abdominal pain and no shortness of breath. Nothing aggravates the symptoms. Nothing relieves the symptoms. He has tried nothing for the symptoms. The treatment provided no relief.    Past Medical History  Diagnosis Date  . Irregular heart rate   . Hypertension   . Acid reflux   . High cholesterol   . COPD (chronic obstructive pulmonary disease)   . Leukemia    Past Surgical History  Procedure Laterality Date  . Back surgery    . Abdominal surgery    . Cataract extraction     History reviewed. No pertinent family history. History  Substance Use Topics  . Smoking status: Former Research scientist (life sciences)  . Smokeless tobacco: Not on file  . Alcohol Use: No    Review of Systems  Constitutional: Negative for fever and chills.  Respiratory: Negative for cough and shortness of breath.   Cardiovascular: Negative for chest pain.  Gastrointestinal: Negative for vomiting and abdominal pain.  All other systems reviewed and are negative.     Allergies  Review of patient's allergies indicates no known allergies.  Home Medications   Prior to Admission medications   Medication Sig Start Date End Date Taking? Authorizing Provider  albuterol (PROVENTIL HFA;VENTOLIN HFA) 108 (90 BASE) MCG/ACT inhaler Inhale 2 puffs into the lungs every 6 (six) hours as needed. For shortness of breath   Yes Historical Provider, MD   budesonide-formoterol (SYMBICORT) 160-4.5 MCG/ACT inhaler Inhale 2 puffs into the lungs 2 (two) times daily.   Yes Historical Provider, MD  cyclobenzaprine (FLEXERIL) 10 MG tablet Take 10 mg by mouth daily.   Yes Historical Provider, MD  digoxin (LANOXIN) 0.25 MG tablet Take 250 mcg by mouth daily.   Yes Historical Provider, MD  donepezil (ARICEPT) 10 MG tablet Take 10 mg by mouth at bedtime.   Yes Historical Provider, MD  meloxicam (MOBIC) 7.5 MG tablet Take 7.5 mg by mouth 2 (two) times daily as needed for pain.   Yes Historical Provider, MD  mirabegron ER (MYRBETRIQ) 50 MG TB24 tablet Take 50 mg by mouth daily.   Yes Historical Provider, MD  nitroGLYCERIN (NITROSTAT) 0.4 MG SL tablet Place 0.4 mg under the tongue every 5 (five) minutes as needed for chest pain.   Yes Historical Provider, MD  omeprazole (PRILOSEC) 20 MG capsule Take 20 mg by mouth daily.   Yes Historical Provider, MD  simvastatin (ZOCOR) 80 MG tablet Take 40 mg by mouth at bedtime.   Yes Historical Provider, MD  tiotropium (SPIRIVA) 18 MCG inhalation capsule Place 18 mcg into inhaler and inhale daily.   Yes Historical Provider, MD  traZODone (DESYREL) 100 MG tablet Take 300 mg by mouth at bedtime.   Yes Historical Provider, MD   BP 139/89  Pulse 95  Temp(Src) 100 F (37.8 C) (Oral)  Resp 16  Ht 6' (1.829 m)  Wt 184 lb (83.462 kg)  BMI 24.95 kg/m2 Physical Exam  Nursing note and vitals reviewed. Constitutional: He is  oriented to person, place, and time. He appears well-developed and well-nourished. No distress.  HENT:  Head: Normocephalic and atraumatic.  Mouth/Throat: No oropharyngeal exudate.  Eyes: EOM are normal. Pupils are equal, round, and reactive to light.  Neck: Normal range of motion. Neck supple.  Cardiovascular: Normal rate and regular rhythm.  Exam reveals no friction rub.   No murmur heard. Pulmonary/Chest: Effort normal and breath sounds normal. No respiratory distress. He has no wheezes. He has no  rales.  Abdominal: He exhibits no distension. There is no tenderness. There is no rebound.  Musculoskeletal: Normal range of motion. He exhibits no edema.  Neurological: He is alert and oriented to person, place, and time. No cranial nerve deficit or sensory deficit. He exhibits normal muscle tone. Gait (unable to walk, falls backwards and towards the right) abnormal. Coordination normal. GCS eye subscore is 4. GCS verbal subscore is 5. GCS motor subscore is 6.  Skin: He is not diaphoretic.    ED Course  Procedures (including critical care time) Labs Review Labs Reviewed  CBC WITH DIFFERENTIAL - Abnormal; Notable for the following:    Platelets 149 (*)    Lymphocytes Relative 8 (*)    Lymphs Abs 0.4 (*)    Monocytes Relative 17 (*)    All other components within normal limits  COMPREHENSIVE METABOLIC PANEL - Abnormal; Notable for the following:    Glucose, Bld 117 (*)    GFR calc non Af Amer 59 (*)    GFR calc Af Amer 68 (*)    All other components within normal limits  CBG MONITORING, ED - Abnormal; Notable for the following:    Glucose-Capillary 121 (*)    All other components within normal limits  I-STAT TROPOININ, ED    Imaging Review Ct Head Wo Contrast  11/11/2013   CLINICAL DATA:  Weakness.  EXAM: CT HEAD WITHOUT CONTRAST  TECHNIQUE: Contiguous axial images were obtained from the base of the skull through the vertex without intravenous contrast.  COMPARISON:  None.  FINDINGS: Diffuse cerebral atrophy. Ventricular dilatation consistent with central atrophy. Low-attenuation changes in the deep white matter consistent with small vessel ischemia. No mass effect or midline shift. 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 No acute intracranial hemorrhage. Gray-white matter junctions are distinct. Basal cisterns are not effaced. Vascular calcifications. Postoperative changes in the globes. Visualized paranasal sinuses and mastoid air cells are not  opacified. No depressed skull fractures.  IMPRESSION: No evidence of acute intracranial abnormality. Chronic atrophy and small vessel ischemic changes.   Electronically Signed   By: Lucienne Capers M.D.   On: 11/11/2013 22:39     EKG Interpretation   Date/Time:  Tuesday November 11 2013 21:16:30 EDT Ventricular Rate:  114 PR Interval:    QRS Duration: 74 QT Interval:  290 QTC Calculation: 399 R Axis:   -5 Text Interpretation:  Atrial flutter with variable A-V block with  premature ventricular or aberrantly conducted complexes Marked ST  abnormality, possible inferior subendocardial injury Flutter new Confirmed  by Mingo Amber  MD, BLAIR ([id number]) on 11/11/2013 10:55:15 PM      MDM   Final diagnoses:  Ataxia    73 year old male with history of A. fib but not currently on anticoagulants, hypertension results with multiple falls and ataxia. Present for the past 2 days. No alleviating or exacerbating factors. He's feels like the swelling to the right most of the time. No chest pain, shortness of breath. No dizziness, but just feels  off-balance. Here vitals show low-grade temp, otherwise stable. On the monitor and on his EKG he is in atrial flutter with 4:1 conduction. He does not have a history of atrial flutter. On exam he has normal coordination, normal strength and sensation, normal cranial nerves. Walking, he is ataxic falling backwards and to the right. He tries to walk it is normal speed, he requires much assistance. This is new. I am concerned that his atrial flutter lead to clot formation and then he might have a stroke at this time. CT head normal. Labs normal. Will order MRI. Neuro consulted. Plan for admission. Dr. Doy Mince with Neurology stated to hold anticoagulation with heparin/lovenox at this time - in case this is a true posterior circulation stroke.  I spoke with Dr. Forde Dandy, with [facility], who refused to admit the patient since this is Neurologic problem. He's aware  that Neurology doesn't admit and asked for the hospitalist to admit.  Osvaldo Shipper, MD 11/12/13 [phone]

## 2013-11-12 ENCOUNTER — Encounter (HOSPITAL_COMMUNITY): Payer: Self-pay | Admitting: General Practice

## 2013-11-12 ENCOUNTER — Observation Stay (HOSPITAL_COMMUNITY): Payer: Medicare Other

## 2013-11-12 DIAGNOSIS — R42 Dizziness and giddiness: Secondary | ICD-10-CM

## 2013-11-12 DIAGNOSIS — R279 Unspecified lack of coordination: Secondary | ICD-10-CM | POA: Diagnosis not present

## 2013-11-12 DIAGNOSIS — S0990XA Unspecified injury of head, initial encounter: Secondary | ICD-10-CM | POA: Diagnosis not present

## 2013-11-12 DIAGNOSIS — I4891 Unspecified atrial fibrillation: Secondary | ICD-10-CM | POA: Diagnosis not present

## 2013-11-12 DIAGNOSIS — I482 Chronic atrial fibrillation, unspecified: Secondary | ICD-10-CM | POA: Diagnosis present

## 2013-11-12 LAB — PROTIME-INR
INR: 1.08 (ref 0.00–1.49)
Prothrombin Time: 13.8 seconds (ref 11.6–15.2)

## 2013-11-12 LAB — DIGOXIN LEVEL: DIGOXIN LVL: 1.4 ng/mL (ref 0.8–2.0)

## 2013-11-12 LAB — APTT: APTT: 29 s (ref 24–37)

## 2013-11-12 MED ORDER — ALBUTEROL SULFATE (2.5 MG/3ML) 0.083% IN NEBU: 2.5000 mg | INHALATION_SOLUTION | Freq: Four times a day (QID) | RESPIRATORY_TRACT | Status: DC | PRN

## 2013-11-12 MED ORDER — PANTOPRAZOLE SODIUM 40 MG PO TBEC
40.0000 mg | DELAYED_RELEASE_TABLET | Freq: Every day | ORAL | Status: DC
  Administered 2013-11-12 – 2013-11-14 (×3): 40 mg via ORAL
  Filled 2013-11-12 (×3): qty 1

## 2013-11-12 MED ORDER — TIOTROPIUM BROMIDE MONOHYDRATE 18 MCG IN CAPS
18.0000 ug | ORAL_CAPSULE | Freq: Every day | RESPIRATORY_TRACT | Status: DC
  Administered 2013-11-12 – 2013-11-14 (×3): 18 ug via RESPIRATORY_TRACT
  Filled 2013-11-12: qty 5

## 2013-11-12 MED ORDER — DIGOXIN 250 MCG PO TABS
250.0000 ug | ORAL_TABLET | Freq: Every day | ORAL | Status: DC
  Administered 2013-11-12 – 2013-11-14 (×3): 250 ug via ORAL
  Filled 2013-11-12 (×3): qty 1

## 2013-11-12 MED ORDER — CYCLOBENZAPRINE HCL 10 MG PO TABS
10.0000 mg | ORAL_TABLET | Freq: Every day | ORAL | Status: DC
  Administered 2013-11-12: 10 mg via ORAL
  Filled 2013-11-12 (×2): qty 1

## 2013-11-12 MED ORDER — MELOXICAM 7.5 MG PO TABS
7.5000 mg | ORAL_TABLET | Freq: Two times a day (BID) | ORAL | Status: DC | PRN
  Administered 2013-11-13: 7.5 mg via ORAL
  Filled 2013-11-12: qty 1

## 2013-11-12 MED ORDER — ACETAMINOPHEN 325 MG PO TABS: 650.0000 mg | ORAL_TABLET | Freq: Four times a day (QID) | ORAL | Status: DC | PRN

## 2013-11-12 MED ORDER — HEPARIN SODIUM (PORCINE) 5000 UNIT/ML IJ SOLN: 5000.0000 [IU] | Freq: Three times a day (TID) | INTRAMUSCULAR | Status: DC

## 2013-11-12 MED ORDER — MIRABEGRON ER 50 MG PO TB24
50.0000 mg | ORAL_TABLET | Freq: Every day | ORAL | Status: DC
  Administered 2013-11-12 – 2013-11-14 (×3): 50 mg via ORAL
  Filled 2013-11-12 (×3): qty 1

## 2013-11-12 MED ORDER — NITROGLYCERIN 0.4 MG SL SUBL: 0.4000 mg | SUBLINGUAL_TABLET | SUBLINGUAL | Status: DC | PRN

## 2013-11-12 MED ORDER — TRAZODONE HCL 100 MG PO TABS
200.0000 mg | ORAL_TABLET | Freq: Every day | ORAL | Status: DC
  Administered 2013-11-12: 200 mg via ORAL
  Filled 2013-11-12 (×2): qty 2

## 2013-11-12 MED ORDER — TRAZODONE HCL 150 MG PO TABS
300.0000 mg | ORAL_TABLET | Freq: Every day | ORAL | Status: DC
  Administered 2013-11-12: 300 mg via ORAL
  Filled 2013-11-12 (×2): qty 2

## 2013-11-12 MED ORDER — ACETAMINOPHEN 500 MG PO TABS
1000.0000 mg | ORAL_TABLET | Freq: Once | ORAL | Status: AC
  Administered 2013-11-12: 1000 mg via ORAL
  Filled 2013-11-12: qty 2

## 2013-11-12 MED ORDER — ATORVASTATIN CALCIUM 40 MG PO TABS
40.0000 mg | ORAL_TABLET | Freq: Every day | ORAL | Status: DC
  Administered 2013-11-12 – 2013-11-13 (×2): 40 mg via ORAL
  Filled 2013-11-12 (×3): qty 1

## 2013-11-12 MED ORDER — ALBUTEROL SULFATE HFA 108 (90 BASE) MCG/ACT IN AERS: 2.0000 | INHALATION_SPRAY | Freq: Four times a day (QID) | RESPIRATORY_TRACT | Status: DC | PRN

## 2013-11-12 MED ORDER — BUDESONIDE-FORMOTEROL FUMARATE 160-4.5 MCG/ACT IN AERO
2.0000 | INHALATION_SPRAY | Freq: Two times a day (BID) | RESPIRATORY_TRACT | Status: DC
  Administered 2013-11-12 – 2013-11-14 (×4): 2 via RESPIRATORY_TRACT
  Filled 2013-11-12: qty 6

## 2013-11-12 MED ORDER — SODIUM CHLORIDE 0.9 % IJ SOLN
3.0000 mL | Freq: Two times a day (BID) | INTRAMUSCULAR | Status: DC
  Administered 2013-11-12 – 2013-11-14 (×3): 3 mL via INTRAVENOUS

## 2013-11-12 MED ORDER — DONEPEZIL HCL 10 MG PO TABS
10.0000 mg | ORAL_TABLET | Freq: Every day | ORAL | Status: DC
  Administered 2013-11-12 – 2013-11-13 (×3): 10 mg via ORAL
  Filled 2013-11-12 (×4): qty 1

## 2013-11-12 NOTE — Progress Notes (Signed)
[facility] PROGRESS NOTE  Todd Lloyd ITG:[id number] DOB: May 19, 1941 DOA: 11/11/2013 PCP: Haywood Pao, MD I have seen and examined pt who is a 73 yo admitted this am by Todd Lloyd in with history of hypertension, COPD, GERD, irregular heart rate, presenting with dizziness, generalized weakness and falls worse over the past 2-3 days. Neuro was consulted and CVA workup initiated>> MRI done today negative for infarct. Patient today denies dizziness, still with increased generalized weakness. Patient's PCP is Todd. Osborne Lloyd I discussed patient with Todd Lloyd on call for [facility] given his negative MRI and he agrees for patient to be transferred back to Todd Lloyd. service beginning in a.m. 6/25.    [facility] Pager [phone]. If 7PM-7AM, please contact night-coverage at [email or web address], password [email or web address] 11/12/2013, 11:40 AM  LOS: 1 day

## 2013-11-12 NOTE — H&P (Signed)
[facility] History and Physical  GORMAN SAFI MWN:[id number] DOB: 04-19-1941 DOA: 11/11/2013  Referring physician: EDP PCP: Haywood Pao, MD   Chief Complaint: Falls   HPI: Todd Lloyd is a 73 y.o. male who presents with dizziness and falls worse over the past 2-3 days.  He has been stumbling ever since a fall off of a ladder 2 weeks ago.  He landed flat on his back striking his head during that fall.  No LOC though he states he was somewhat dazed.  Today it has progressed to dizziness such that patient has difficulty ambulating without assistance.  As a result he presented to the ED.  Review of Systems: Subjective chills and night sweats.  No cough, SOB is chronic and baseline he states from COPD.  Systems reviewed.  As above, otherwise negative  Past Medical History  Diagnosis Date  . Irregular heart rate   . Hypertension   . Acid reflux   . High cholesterol   . COPD (chronic obstructive pulmonary disease)   . Leukemia    Past Surgical History  Procedure Laterality Date  . Back surgery    . Abdominal surgery    . Cataract extraction     Social History:  reports that he has quit smoking. He does not have any smokeless tobacco history on file. He reports that he does not drink alcohol or use illicit drugs.  No Known Allergies  History reviewed. No pertinent family history.   Prior to Admission medications   Medication Sig Start Date End Date Taking? Authorizing Provider  albuterol (PROVENTIL HFA;VENTOLIN HFA) 108 (90 BASE) MCG/ACT inhaler Inhale 2 puffs into the lungs every 6 (six) hours as needed. For shortness of breath   Yes Historical Provider, MD  budesonide-formoterol (SYMBICORT) 160-4.5 MCG/ACT inhaler Inhale 2 puffs into the lungs 2 (two) times daily.   Yes Historical Provider, MD  cyclobenzaprine (FLEXERIL) 10 MG tablet Take 10 mg by mouth daily.   Yes Historical Provider, MD  digoxin (LANOXIN) 0.25 MG tablet Take 250 mcg by mouth daily.   Yes  Historical Provider, MD  donepezil (ARICEPT) 10 MG tablet Take 10 mg by mouth at bedtime.   Yes Historical Provider, MD  meloxicam (MOBIC) 7.5 MG tablet Take 7.5 mg by mouth 2 (two) times daily as needed for pain.   Yes Historical Provider, MD  mirabegron ER (MYRBETRIQ) 50 MG TB24 tablet Take 50 mg by mouth daily.   Yes Historical Provider, MD  nitroGLYCERIN (NITROSTAT) 0.4 MG SL tablet Place 0.4 mg under the tongue every 5 (five) minutes as needed for chest pain.   Yes Historical Provider, MD  omeprazole (PRILOSEC) 20 MG capsule Take 20 mg by mouth daily.   Yes Historical Provider, MD  simvastatin (ZOCOR) 80 MG tablet Take 40 mg by mouth at bedtime.   Yes Historical Provider, MD  tiotropium (SPIRIVA) 18 MCG inhalation capsule Place 18 mcg into inhaler and inhale daily.   Yes Historical Provider, MD  traZODone (DESYREL) 100 MG tablet Take 300 mg by mouth at bedtime.   Yes Historical Provider, MD   Physical Exam: Filed Vitals:   11/12/13 0045  BP: 148/81  Pulse:   Temp:   Resp: 15    BP 148/81  Pulse 95  Temp(Src) 100 F (37.8 C) (Oral)  Resp 15  Ht 6' (1.829 m)  Wt 83.462 kg (184 lb)  BMI 24.95 kg/m2  SpO2 94%  General Appearance:    Alert, oriented, no distress, appears stated age  Head:    Normocephalic, atraumatic  Eyes:    PERRL, EOMI, sclera non-icteric        Nose:   Nares without drainage or epistaxis. Mucosa, turbinates normal  Throat:   Moist mucous membranes. Oropharynx without erythema or exudate.  Neck:   Supple. No carotid bruits.  No thyromegaly.  No lymphadenopathy.   Back:     No CVA tenderness, no spinal tenderness  Lungs:     Clear to auscultation bilaterally, without wheezes, rhonchi or rales  Chest wall:    No tenderness to palpitation  Heart:    Regular rate and rhythm without murmurs, gallops, rubs  Abdomen:     Soft, non-tender, nondistended, normal bowel sounds, no organomegaly  Genitalia:    deferred  Rectal:    deferred  Extremities:   No clubbing,  cyanosis or edema.  Pulses:   2+ and symmetric all extremities  Skin:   Skin color, texture, turgor normal, no rashes or lesions  Lymph nodes:   Cervical, supraclavicular, and axillary nodes normal  Neurologic:   CNII-XII intact. Normal strength, sensation and reflexes      Throughout.  Gait abnormal, falls backwards and to the right.    Labs on Admission:  Basic Metabolic Panel:  Recent Labs Lab 11/11/13 2123  NA 138  K 4.7  CL 97  CO2 29  GLUCOSE 117*  BUN 14  CREATININE 1.19  CALCIUM 10.0   Liver Function Tests:  Recent Labs Lab 11/11/13 2123  AST 24  ALT 19  ALKPHOS 87  BILITOT 0.6  PROT 6.9  ALBUMIN 4.1   No results found for this basename: LIPASE, AMYLASE,  in the last 168 hours No results found for this basename: AMMONIA,  in the last 168 hours CBC:  Recent Labs Lab 11/11/13 2123  WBC 5.9  NEUTROABS 4.4  HGB 15.6  HCT 45.6  MCV 95.2  PLT 149*   Cardiac Enzymes: No results found for this basename: CKTOTAL, CKMB, CKMBINDEX, TROPONINI,  in the last 168 hours  BNP (last 3 results) No results found for this basename: PROBNP,  in the last 8760 hours CBG:  Recent Labs Lab 11/11/13 2118  GLUCAP 121*    Radiological Exams on Admission: Ct Head Wo Contrast  11/11/2013   CLINICAL DATA:  Weakness.  EXAM: CT HEAD WITHOUT CONTRAST  TECHNIQUE: Contiguous axial images were obtained from the base of the skull through the vertex without intravenous contrast.  COMPARISON:  None.  FINDINGS: Diffuse cerebral atrophy. Ventricular dilatation consistent with central atrophy. Low-attenuation changes in the deep white matter consistent with small vessel ischemia. No mass effect or midline shift. 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 No acute intracranial hemorrhage. Gray-white matter junctions are distinct. Basal cisterns are not effaced. Vascular calcifications. Postoperative changes in the globes. Visualized paranasal sinuses and  mastoid air cells are not opacified. No depressed skull fractures.  IMPRESSION: No evidence of acute intracranial abnormality. Chronic atrophy and small vessel ischemic changes.   Electronically Signed   By: Lucienne Capers M.D.   On: 11/11/2013 22:39    EKG: Independently reviewed.  Assessment/Plan Principal Problem:   Dizziness Active Problems:   Atrial fibrillation with controlled ventricular response   1. Dizziness - in for MRI to r/o stroke, neurology has seen patient.  Holding anticoagulation at this time per neurology request in case this is a true posterior circulation stroke.  Has h/o chronic A.Fib / Flutter which is rate controlled, but on  no anticoagulants for this.  Admit for obs and MRI for now, full stroke work up if he does indeed have evidence of stroke on MRI (likely source would be his A.fib).   Code Status: Full Code  Family Communication: Family at bedside Disposition Plan: Admit to obs   Time spent: 50 min  GARDNER, JARED M. [facility] Pager [phone]  If 7AM-7PM, please contact the day team taking care of the patient [email or web address] Password [email or web address] 11/12/2013, 1:11 AM

## 2013-11-12 NOTE — Consult Note (Signed)
Referring Physician: Mingo Amber    Chief Complaint: Frequent falls  HPI: Todd Lloyd is an 73 y.o. male who reports that about 3-4 weeks ago he began to notice that he was more off balance.  He was having a few falls but today has had multiple falls and feels that today has been the worst day ever.  He reports that whenever he gets up he has to hold on and is falling particularly to the right.  He does not report any dizziness or feeling of presyncope.  He does not describe any focal weakness or numbness. The patient did have a fall off a ladder about 3 weeks ago but reports that it was not due to feeling off balance.    Date last known well: Unable to determine Time last known well: Unable to determine tPA Given: No: Unable to determine LKW  Past Medical History  Diagnosis Date  . Irregular heart rate   . Hypertension   . Acid reflux   . High cholesterol   . COPD (chronic obstructive pulmonary disease)   . Leukemia     Past Surgical History  Procedure Laterality Date  . Back surgery    . Abdominal surgery    . Cataract extraction     Family history:  Father deceased from a MI.  His mother died today of Alzheimers.  He had a sister that is deceased from [place] and a brother deceased from lung cancer.    Social History:  reports that he has quit smoking. He does not have any smokeless tobacco history on file. He reports that he does not drink alcohol or use illicit drugs.  Allergies: No Known Allergies  Medications: I have reviewed the patient's current medications. Prior to Admission:  Current facility-administered medications:albuterol (PROVENTIL HFA;VENTOLIN HFA) 108 (90 BASE) MCG/ACT inhaler 2 puff, 2 puff, Inhalation, Q6H PRN, Etta Quill, DO;  atorvastatin (LIPITOR) tablet 40 mg, 40 mg, Oral, q1800, Etta Quill, DO;  budesonide-formoterol (SYMBICORT) 160-4.5 MCG/ACT inhaler 2 puff, 2 puff, Inhalation, BID, Jared M Gardner, DO;  cyclobenzaprine (FLEXERIL) tablet 10 mg,  10 mg, Oral, Daily, Jared M Gardner, DO digoxin (LANOXIN) tablet 250 mcg, 250 mcg, Oral, Daily, Jared M Gardner, DO;  donepezil (ARICEPT) tablet 10 mg, 10 mg, Oral, QHS, Jared M Gardner, DO;  heparin injection 5,000 Units, 5,000 Units, Subcutaneous, 3 times per day, Jared M Gardner, DO;  meloxicam [facility]) tablet 7.5 mg, 7.5 mg, Oral, BID PRN, Etta Quill, DO;  mirabegron ER [facility]) tablet 50 mg, 50 mg, Oral, Daily, Jared M Gardner, DO nitroGLYCERIN (NITROSTAT) SL tablet 0.4 mg, 0.4 mg, Sublingual, Q5 min PRN, Jared M Gardner, DO;  pantoprazole (PROTONIX) EC tablet 40 mg, 40 mg, Oral, Daily, Jared M Gardner, DO;  sodium chloride 0.9 % injection 3 mL, 3 mL, Intravenous, Q12H, Jared M Gardner, DO;  tiotropium [facility]) inhalation capsule 18 mcg, 18 mcg, Inhalation, Daily, Jared M Gardner, DO;  traZODone (DESYREL) tablet 300 mg, 300 mg, Oral, QHS, Jared M Gardner, DO Current outpatient prescriptions:albuterol (PROVENTIL HFA;VENTOLIN HFA) 108 (90 BASE) MCG/ACT inhaler, Inhale 2 puffs into the lungs every 6 (six) hours as needed. For shortness of breath, Disp: , Rfl: ;  budesonide-formoterol (SYMBICORT) 160-4.5 MCG/ACT inhaler, Inhale 2 puffs into the lungs 2 (two) times daily., Disp: , Rfl: ;  cyclobenzaprine (FLEXERIL) 10 MG tablet, Take 10 mg by mouth daily., Disp: , Rfl:  digoxin (LANOXIN) 0.25 MG tablet, Take 250 mcg by mouth daily., Disp: , Rfl: ;  donepezil (ARICEPT) 10 MG tablet, Take 10 mg by mouth at bedtime., Disp: , Rfl: ;  meloxicam (MOBIC) 7.5 MG tablet, Take 7.5 mg by mouth 2 (two) times daily as needed for pain., Disp: , Rfl: ;  mirabegron ER (MYRBETRIQ) 50 MG TB24 tablet, Take 50 mg by mouth daily., Disp: , Rfl:  nitroGLYCERIN (NITROSTAT) 0.4 MG SL tablet, Place 0.4 mg under the tongue every 5 (five) minutes as needed for chest pain., Disp: , Rfl: ;  omeprazole (PRILOSEC) 20 MG capsule, Take 20 mg by mouth daily., Disp: , Rfl: ;  simvastatin (ZOCOR) 80 MG tablet, Take 40 mg by mouth at  bedtime., Disp: , Rfl: ;  tiotropium (SPIRIVA) 18 MCG inhalation capsule, Place 18 mcg into inhaler and inhale daily., Disp: , Rfl:  traZODone (DESYREL) 100 MG tablet, Take 300 mg by mouth at bedtime., Disp: , Rfl:   ROS: History obtained from the patient  General ROS: negative for - chills, fatigue, fever, night sweats, weight gain or weight loss Psychological ROS: negative for - behavioral disorder, hallucinations, memory difficulties, mood swings or suicidal ideation Ophthalmic ROS: negative for - blurry vision, double vision, eye pain or loss of vision ENT ROS: negative for - epistaxis, nasal discharge, oral lesions, sore throat, tinnitus or vertigo Allergy and Immunology ROS: negative for - hives or itchy/watery eyes Hematological and Lymphatic ROS: multiple bruises from falls Endocrine ROS: negative for - galactorrhea, hair pattern changes, polydipsia/polyuria or temperature intolerance Respiratory ROS: shortness of breath  Cardiovascular ROS:  irregular heartbeat Gastrointestinal ROS: negative for - abdominal pain, diarrhea, hematemesis, nausea/vomiting or stool incontinence Genito-Urinary ROS: negative for - dysuria, hematuria, incontinence or urinary frequency/urgency Musculoskeletal ROS: back and neck pain Neurological ROS: as noted in HPI Dermatological ROS: negative for rash and skin lesion changes  Physical Examination: Blood pressure 148/81, pulse 95, temperature 100 F (37.8 C), temperature source Oral, resp. rate 15, height 6' (1.829 m), weight 83.462 kg (184 lb).  Neurologic Examination: Mental Status: Alert, oriented, easily confused about timeline.  Speech fluent without evidence of aphasia.  Able to follow 3 step commands without difficulty. Cranial Nerves: II: Discs flat bilaterally; Visual fields grossly normal, pupils irregular and poorly reactive III,IV, VI: left ptosis, extra-ocular motions intact bilaterally V,VII: smile symmetric, facial light touch sensation  normal bilaterally VIII: hearing normal bilaterally IX,X: gag reflex present XI: bilateral shoulder shrug XII: midline tongue extension Motor: Right : Upper extremity   5/5         Left:     Upper extremity   5/5  Lower extremity   4+/5 with external rotation noted at rest     Lower extremity   5-/5 Tone and bulk:normal tone throughout; no atrophy noted Sensory: Pinprick and light touch intact throughout, bilaterally Deep Tendon Reflexes: 2+ and symmetric with absent AJ's bilaterally Plantars: Right: downgoing   Left: downgoing Cerebellar: normal finger-to-nose and normal heel-to-shin test Gait: patient falls when gait attempted CV: pulses palpable throughout     Laboratory Studies:  Basic Metabolic Panel:  Recent Labs Lab 11/11/13 2123  NA 138  K 4.7  CL 97  CO2 29  GLUCOSE 117*  BUN 14  CREATININE 1.19  CALCIUM 10.0    Liver Function Tests:  Recent Labs Lab 11/11/13 2123  AST 24  ALT 19  ALKPHOS 87  BILITOT 0.6  PROT 6.9  ALBUMIN 4.1   No results found for this basename: LIPASE, AMYLASE,  in the last 168 hours No results found for this basename:  AMMONIA,  in the last 168 hours  CBC:  Recent Labs Lab 11/11/13 2123  WBC 5.9  NEUTROABS 4.4  HGB 15.6  HCT 45.6  MCV 95.2  PLT 149*    Cardiac Enzymes: No results found for this basename: CKTOTAL, CKMB, CKMBINDEX, TROPONINI,  in the last 168 hours  BNP: No components found with this basename: POCBNP,   CBG:  Recent Labs Lab 11/11/13 2118  GLUCAP 121*    Microbiology: No results found for this or any previous visit.  Coagulation Studies: No results found for this basename: LABPROT, INR,  in the last 72 hours  Urinalysis: No results found for this basename: COLORURINE, APPERANCEUR, LABSPEC, PHURINE, GLUCOSEU, HGBUR, BILIRUBINUR, KETONESUR, PROTEINUR, UROBILINOGEN, NITRITE, LEUKOCYTESUR,  in the last 168 hours  Lipid Panel: No results found for this basename: chol, trig, hdl, cholhdl,  vldl, ldlcalc    HgbA1C:  No results found for this basename: HGBA1C    Urine Drug Screen:   No results found for this basename: labopia, cocainscrnur, labbenz, amphetmu, thcu, labbarb    Alcohol Level: No results found for this basename: ETH,  in the last 168 hours  Other results: EKG: aflutter with variable AV block at 114 bpm.  Imaging: Ct Head Wo Contrast  11/11/2013   CLINICAL DATA:  Weakness.  EXAM: CT HEAD WITHOUT CONTRAST  TECHNIQUE: Contiguous axial images were obtained from the base of the skull through the vertex without intravenous contrast.  COMPARISON:  None.  FINDINGS: Diffuse cerebral atrophy. Ventricular dilatation consistent with central atrophy. Low-attenuation changes in the deep white matter consistent with small vessel ischemia. No mass effect or midline shift. 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 No acute intracranial hemorrhage. Gray-white matter junctions are distinct. Basal cisterns are not effaced. Vascular calcifications. Postoperative changes in the globes. Visualized paranasal sinuses and mastoid air cells are not opacified. No depressed skull fractures.  IMPRESSION: No evidence of acute intracranial abnormality. Chronic atrophy and small vessel ischemic changes.   Electronically Signed   By: Lucienne Capers M.D.   On: 11/11/2013 22:39    Assessment: 73 y.o. male presenting with gait imbalance.  Although his symptoms clearly worsened today, it is unclear when his symptoms initially began.  Patient with a history of atrial fibrillation, on no anticoagulation.  Can not rule out the possibility of an infarct but patient may also be symptomatic from his heart rhythm.  Head CT reviewed and shows no acute changes.  Further work up recommended.    Stroke Risk Factors - atrial fibrillation, hyperlipidemia and hypertension  Plan: 1. HgbA1c, fasting lipid panel 2. MRI, MRA  of the brain without contrast with full stroke work up to  be ordered if evidence of acute ischemic disease noted.   3. PT consult, OT consult 4. Echocardiogram 5. Prophylactic therapy-Antiplatelet med: Aspirin - dose 325mg  daily 6. Telemetry monitoring 7. Frequent neuro checks   Alexis Goodell, MD [facility] [phone] 11/12/2013, 1:08 AM

## 2013-11-12 NOTE — Progress Notes (Signed)
Utilization review completed.  

## 2013-11-12 NOTE — ED Notes (Signed)
Admitting at bedside 

## 2013-11-12 NOTE — Progress Notes (Signed)
INITIAL NUTRITION ASSESSMENT  DOCUMENTATION CODES Per approved criteria  -Not Applicable   INTERVENTION: -Patient educated on menu options. Patient encouraged to choose foods per preference.  -Patient encouraged to request snacks as desired.  -RD to continue to monitor.   NUTRITION DIAGNOSIS: Predicted suboptimal energy intake related to dizziness as evidenced by recent history of poor intake.   Goal: Patient will meet >/=90% of estimated nutrition needs  Monitor:  PO intake, weight, labs, I/Os  Reason for Assessment: Malnutrition screening tool  73 y.o. male  Admitting Dx: Dizziness  ASSESSMENT: 73 year old male who presents with dizziness and falls since falling off a ladder 2 weeks ago. This has worsened over the past 2-3 days. Admitted for MRI to r/o stroke.  Patient reports that his appetite is usually good. However, he barely ate anything over the last 1-2 days. He is eating at snack at time of RD visit and states that he is very hungry. He does report that he doesn't like chicken or fish, which has limited intake. However, reported intake of breakfast was about 100%. He denies recent weight loss. No muscle or subcutaneous fat wasting noted.  Reviewed menu options with patient. He was encouraged to order foods per preference to improve PO intake.   Height: Ht Readings from Last 1 Encounters:  11/12/13 6' (1.829 m)    Weight: Wt Readings from Last 1 Encounters:  11/11/13 184 lb (83.462 kg)    Ideal Body Weight: 178 pounds  % Ideal Body Weight: 103%  Wt Readings from Last 10 Encounters:  11/11/13 184 lb (83.462 kg)  08/30/11 197 lb (89.359 kg)    Usual Body Weight: Unknown  % Usual Body Weight:   BMI:  Body mass index is 24.95 kg/(m^2). Patient is normal weight.   Estimated Nutritional Needs: Kcal: 1950-2100 kcal Protein: 85-100 g Fluid: >2.5 L/day  Skin: Intact  Diet Order: Cardiac  EDUCATION NEEDS: -Education needs addressed   Intake/Output  Summary (Last 24 hours) at 11/12/13 1145 Last data filed at 11/12/13 0900  Gross per 24 hour  Intake    240 ml  Output      0 ml  Net    240 ml    Last BM: PTA   Labs:   Recent Labs Lab 11/11/13 2123  NA 138  K 4.7  CL 97  CO2 29  BUN 14  CREATININE 1.19  CALCIUM 10.0  GLUCOSE 117*    CBG (last 3)   Recent Labs  11/11/13 2118  GLUCAP 121*    Scheduled Meds: . atorvastatin  40 mg Oral q1800  . budesonide-formoterol  2 puff Inhalation BID  . cyclobenzaprine  10 mg Oral Daily  . digoxin  250 mcg Oral Daily  . donepezil  10 mg Oral QHS  . mirabegron ER  50 mg Oral Daily  . pantoprazole  40 mg Oral Daily  . sodium chloride  3 mL Intravenous Q12H  . tiotropium  18 mcg Inhalation Daily  . traZODone  300 mg Oral QHS    Continuous Infusions:   Past Medical History  Diagnosis Date  . Irregular heart rate   . Hypertension   . Acid reflux   . High cholesterol   . COPD (chronic obstructive pulmonary disease)   . Leukemia     Past Surgical History  Procedure Laterality Date  . Back surgery    . Abdominal surgery    . Cataract extraction      Larey Seat, RD, LDN Pager #:  [place] Pager #: [phone]

## 2013-11-13 DIAGNOSIS — R270 Ataxia, unspecified: Secondary | ICD-10-CM | POA: Diagnosis present

## 2013-11-13 DIAGNOSIS — D531 Other megaloblastic anemias, not elsewhere classified: Secondary | ICD-10-CM | POA: Diagnosis not present

## 2013-11-13 DIAGNOSIS — I4891 Unspecified atrial fibrillation: Secondary | ICD-10-CM | POA: Diagnosis not present

## 2013-11-13 DIAGNOSIS — E236 Other disorders of pituitary gland: Secondary | ICD-10-CM | POA: Diagnosis not present

## 2013-11-13 DIAGNOSIS — G4700 Insomnia, unspecified: Secondary | ICD-10-CM | POA: Diagnosis not present

## 2013-11-13 DIAGNOSIS — I359 Nonrheumatic aortic valve disorder, unspecified: Secondary | ICD-10-CM | POA: Diagnosis not present

## 2013-11-13 DIAGNOSIS — G3281 Cerebellar ataxia in diseases classified elsewhere: Secondary | ICD-10-CM | POA: Diagnosis not present

## 2013-11-13 DIAGNOSIS — R279 Unspecified lack of coordination: Secondary | ICD-10-CM | POA: Diagnosis not present

## 2013-11-13 LAB — CK: Total CK: 80 U/L (ref 7–232)

## 2013-11-13 LAB — URINALYSIS, ROUTINE W REFLEX MICROSCOPIC
Bilirubin Urine: NEGATIVE
GLUCOSE, UA: NEGATIVE mg/dL
HGB URINE DIPSTICK: NEGATIVE
Ketones, ur: NEGATIVE mg/dL
LEUKOCYTES UA: NEGATIVE
Nitrite: NEGATIVE
PH: 5.5 (ref 5.0–8.0)
PROTEIN: NEGATIVE mg/dL
Specific Gravity, Urine: 1.018 (ref 1.005–1.030)
Urobilinogen, UA: 0.2 mg/dL (ref 0.0–1.0)

## 2013-11-13 LAB — BASIC METABOLIC PANEL
BUN: 20 mg/dL (ref 6–23)
CHLORIDE: 97 meq/L (ref 96–112)
CO2: 28 mEq/L (ref 19–32)
CREATININE: 1.2 mg/dL (ref 0.50–1.35)
Calcium: 9.1 mg/dL (ref 8.4–10.5)
GFR, EST AFRICAN AMERICAN: 67 mL/min — AB (ref >90)
GFR, EST NON AFRICAN AMERICAN: 58 mL/min — AB (ref >90)
Glucose, Bld: 106 mg/dL — ABNORMAL HIGH (ref 70–99)
POTASSIUM: 4.3 meq/L (ref 3.7–5.3)
Sodium: 138 mEq/L (ref 137–147)

## 2013-11-13 LAB — SEDIMENTATION RATE: Sed Rate: 4 mm/hr (ref 0–16)

## 2013-11-13 LAB — RPR

## 2013-11-13 LAB — VITAMIN B12: Vitamin B-12: 330 pg/mL (ref 211–911)

## 2013-11-13 MED ORDER — TRAZODONE HCL 100 MG PO TABS
100.0000 mg | ORAL_TABLET | Freq: Every day | ORAL | Status: DC
  Administered 2013-11-13: 100 mg via ORAL
  Filled 2013-11-13 (×2): qty 1

## 2013-11-13 NOTE — Progress Notes (Signed)
[facility] assuming primary care per [facility] request given lack of CVA on MRI.  Subjective: History reviewed with patient- describes 1-2 month history of gait instability that has progressively worsened to the point that he is unable to ambulate without consistently falling to the right.  This preceded his recent fall off the ladder (unclear if that fall was due to unsteadiness or not).  Now he is unable to stand or sometimes sit on the side of the bed without leaning to the right and then becoming very unsteady.  Describes generalized weakness, no focal weakness or speech changes.  Has had chronic left sided facial tingling that tends to improve following Cervical spine injections that he has received from Dr. Ernestina Patches.  Objective: Vital signs in last 24 hours: Temp:  [98.4 F (36.9 C)-99.3 F (37.4 C)] 98.5 F (36.9 C) (06/25 0400) Pulse Rate:  [87-100] 100 (06/25 0400) Resp:  [16-18] 18 (06/25 0400) BP: (122-166)/(61-94) 122/71 mmHg (06/25 0400) SpO2:  [93 %-97 %] 95 % (06/25 0400) Weight change:     CBG (last 3)   Recent Labs  11/11/13 2118  GLUCAP 121*    Intake/Output from previous day: 06/24 0701 - 06/25 0700 In: 480 [P.O.:480] Out: 225 [Urine:225] Intake/Output this shift:    General appearance: alert and no distress Eyes: no scleral icterus Throat: oropharynx moist without erythema Resp: clear to auscultation bilaterally Cardio: irregularly irregular rhythm GI: soft, non-tender; bowel sounds normal; no masses,  no organomegaly Extremities: no clubbing, cyanosis or edema Neuro: generalized weakness bilateral lower extremities.  DTRs 1+ bilaterally.  Sensation grossly intact.  Able to sit without ataxia but upon standing he progressively became more ataxic.  Romberg positive with leaning to the right followed by ataxia.  Finger to nose and heel to shin intact.  Toes upgoing.     Lab Results:  Recent Labs  11/11/13 2123 11/13/13 0343  NA 138 138  K 4.7 4.3  CL  97 97  CO2 29 28  GLUCOSE 117* 106*  BUN 14 20  CREATININE 1.19 1.20  CALCIUM 10.0 9.1    Recent Labs  11/11/13 2123  AST 24  ALT 19  ALKPHOS 87  BILITOT 0.6  PROT 6.9  ALBUMIN 4.1    Recent Labs  11/11/13 2123  WBC 5.9  NEUTROABS 4.4  HGB 15.6  HCT 45.6  MCV 95.2  PLT 149*   Lab Results  Component Value Date   INR 1.08 11/12/2013    Studies/Results: Ct Head Wo Contrast  11/11/2013   CLINICAL DATA:  Weakness.  EXAM: CT HEAD WITHOUT CONTRAST  TECHNIQUE: Contiguous axial images were obtained from the base of the skull through the vertex without intravenous contrast.  COMPARISON:  None.  FINDINGS: Diffuse cerebral atrophy. Ventricular dilatation consistent with central atrophy. Low-attenuation changes in the deep white matter consistent with small vessel ischemia. No mass effect or midline shift. 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 No acute intracranial hemorrhage. Gray-white matter junctions are distinct. Basal cisterns are not effaced. Vascular calcifications. Postoperative changes in the globes. Visualized paranasal sinuses and mastoid air cells are not opacified. No depressed skull fractures.  IMPRESSION: No evidence of acute intracranial abnormality. Chronic atrophy and small vessel ischemic changes.   Electronically Signed   By: Lucienne Capers M.D.   On: 11/11/2013 22:39   Mr Brain Wo Contrast  11/12/2013   CLINICAL DATA:  Dizziness and falls.  EXAM: MRI HEAD WITHOUT CONTRAST  TECHNIQUE: Multiplanar, multiecho pulse  sequences of the brain and surrounding structures were obtained without intravenous contrast.  COMPARISON:  CT head 11/11/2013.  MRI cervical spine 02/17/2011.  FINDINGS: Prominent retrocerebellar cisterna magna is stable from 2012 MR. There is generalized cerebral and cerebellar atrophy with prominence of the ventricles, cisterns, and sulci. There are scattered areas of subcortical and periventricular T2 and FLAIR  hyperintensity consistent with chronic microvascular ischemic change of a mild-to-moderate degree.  There is no acute stroke, acute hemorrhage, mass lesion, or extra-axial fluid. Mild ventricular enlargement fell secondary to atrophy. Calvarium intact. No subdural or epidural hematoma. No interhemispheric fluid collection.  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 This is a nonspecific abnormality.  Flow voids are maintained in the internal carotid, basilar, and vertebral arteries. Right vertebral is dominant with left vertebral ending in PICA. No tonsillar herniation. No acute sinus or mastoid disease. Negative orbits. No significant scalp hematoma.  IMPRESSION: Chronic changes as described. Atrophy and small vessel disease. No acute intracranial findings.  Empty sella, uncertain clinical significance.   Electronically Signed   By: Rolla Flatten M.D.   On: 11/12/2013 08:29     Medications: Scheduled: . atorvastatin  40 mg Oral q1800  . budesonide-formoterol  2 puff Inhalation BID  . cyclobenzaprine  10 mg Oral Daily  . digoxin  250 mcg Oral Daily  . donepezil  10 mg Oral QHS  . mirabegron ER  50 mg Oral Daily  . pantoprazole  40 mg Oral Daily  . sodium chloride  3 mL Intravenous Q12H  . tiotropium  18 mcg Inhalation Daily  . traZODone  200 mg Oral QHS   Continuous:   Assessment/Plan: 1. 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  Does not appear to be vertigo.  MRI negative for CVA.  Will obtain orthostatics.  Proceed with carotid dopplers to rule out vertebrobasilar insufficiency and Echocardiogram (likely low yield).  Discontinue Flexeril and decrease Trazodone.  Check B12, RPR, and heavy metals screen.  Discussed with Neurology- would appreciate guidance as patient is very high fall risk if discharged.  PT/OT evaluation. 2. Urinary Frequency- check urinalysis.  Continue Mybetriq. 3. COPD- continue Spiriva, Symbicort.  Well  controlled. 4. Afib- consider discontinue Digoxin.  No anticoagulation due to fall risk. 5. Disposition- change to inpatient status due to intensity of evaluation and medication changes.  Anticipate discharge tomorrow pending neurology recommendations and PT/OT evaluations.   LOS: 2 days   Marton Redwood 11/13/2013, 7:33 AM

## 2013-11-13 NOTE — Progress Notes (Signed)
Echocardiogram 2D Echocardiogram has been performed.  Todd Lloyd 11/13/2013, 11:11 AM

## 2013-11-13 NOTE — Progress Notes (Signed)
Subjective: Patient had no complaints. Instability on sitting and standing is unchanged.  Objective: Current vital signs: BP 121/72  Pulse 91  Temp(Src) 98 F (36.7 C) (Oral)  Resp 18  Ht 6' (1.829 m)  Wt 83.462 kg (184 lb)  BMI 24.95 kg/m2  SpO2 91%  Neurologic Exam: Alert and in no acute distress. Mental status was normal. Bilateral eyelid ptosis noted. Patient had no clear fatigue of the eyelids on sustained upgaze. Extraocular movements were full and conjugate with no diplopia in any field of gaze. No facial weakness noted. Mild neck flexor weakness noted. Mild shoulder girdle weakness noted as well as slight hip flexor weakness bilaterally.  Medications: I have reviewed the patient's current medications.  Assessment/Plan: Etiology for patient's instability on sitting and standing, as well as mild proximal weakness is unclear. MRI of his brain showed no acute intracranial abnormality. Stroke is unlikely. B12 deficiency will need to be ruled out, as well as possible myopathy.  Recommendations: 1. Vitamin B12 level 2. ESR and serum CK 3. Myasthenia gravis panel 4. Physical therapy intervention  We will continue to follow this patient closely with you.  C.R. Nicole Kindred, MD [facility] [phone]  11/13/2013  9:32 AM

## 2013-11-13 NOTE — Evaluation (Signed)
Physical Therapy Evaluation Patient Details Name: Todd Lloyd MRN: [id number] DOB: 05/16/1941 Today's Date: 11/13/2013   History of Present Illness  73 yo with 1-2 month history of gait instability that has progressively worsened to the point that he is unable to ambulate without consistently falling to the right.  Clinical Impression  Pt pleasant but with decreased safety awareness and decreased ability with balance and gait. Pt will benefit from acute therapy to maximize balance, gait and independence to decrease burden of care. REcommend pt use RW with supervision acutely for all ambulation. Will follow.     Follow Up Recommendations Home health PT    Equipment Recommendations  None recommended by PT    Recommendations for Other Services       Precautions / Restrictions Precautions Precautions: Fall      Mobility  Bed Mobility Overal bed mobility: Modified Independent                Transfers Overall transfer level: Needs assistance Equipment used: Rolling walker (2 wheeled) Transfers: Sit to/from Stand Sit to Stand: Min guard Stand pivot transfers: Min assist       General transfer comment: cues for hand placement with very close guarding for safety with LOB multiple times during session with pushing walker away  Ambulation/Gait Ambulation/Gait assistance: Min guard Ambulation Distance (Feet): 250 Feet Assistive device: Rolling walker (2 wheeled) Gait Pattern/deviations: Step-through pattern;Decreased stride length   Gait velocity interpretation: Below normal speed for age/gender General Gait Details: pt with multiple LOB with cues throughout for posture and position in RW  Stairs            Wheelchair Mobility    Modified Rankin (Stroke Patients Only)       Balance Overall balance assessment: Needs assistance Sitting-balance support: Feet supported;No upper extremity supported Sitting balance-Leahy Scale: Good       Standing  balance-Leahy Scale: Poor                               Pertinent Vitals/Pain No pain HR 104 sats 95% on RA with ambulation    Home Living Family/patient expects to be discharged to:: Private residence Living Arrangements: Spouse/significant other Available Help at Discharge: Available 24 hours/day;Family (grandchuildren can be there if needed) Type of Home: House Home Access: Stairs to enter Entrance Stairs-Rails: None Entrance Stairs-Number of Steps: 1 Home Layout: One level Home Equipment: Walker - 2 wheels;Cane - single point      Prior Function Level of Independence: Independent               Hand Dominance   Dominant Hand: Right    Extremity/Trunk Assessment   Upper Extremity Assessment: Defer to OT evaluation           Lower Extremity Assessment: Overall WFL for tasks assessed      Cervical / Trunk Assessment: Kyphotic  Communication   Communication: No difficulties  Cognition Arousal/Alertness: Awake/alert Behavior During Therapy: WFL for tasks assessed/performed Overall Cognitive Status: Impaired/Different from baseline Area of Impairment: Safety/judgement         Safety/Judgement: Decreased awareness of safety;Decreased awareness of deficits          General Comments      Exercises        Assessment/Plan    PT Assessment Patient needs continued PT services  PT Diagnosis Difficulty walking   PT Problem List Decreased strength;Decreased activity tolerance;Decreased balance;Decreased knowledge of use  of DME  PT Treatment Interventions DME instruction;Gait training;Functional mobility training;Therapeutic activities;Therapeutic exercise;Patient/family education;Balance training   PT Goals (Current goals can be found in the Care Plan section) Acute Rehab PT Goals Patient Stated Goal: be able to take care of my grandson PT Goal Formulation: With patient Time For Goal Achievement: 11/27/13 Potential to Achieve Goals:  Good    Frequency Min 3X/week   Barriers to discharge        Co-evaluation               End of Session Equipment Utilized During Treatment: Gait belt Activity Tolerance: Patient tolerated treatment well Patient left: in chair;with call bell/phone within reach           Time: 0931-0948 PT Time Calculation (min): 17 min   Charges:   PT Evaluation $Initial PT Evaluation Tier I: 1 Procedure PT Treatments $Gait Training: 8-22 mins   PT G CodesMelford Aase 11/13/2013, 9:57 AM Elwyn Reach, [place]

## 2013-11-13 NOTE — Progress Notes (Signed)
Occupational Therapy Evaluation Patient Details Name: Todd Lloyd MRN: [id number] DOB: 07/15/1940 Today's Date: 11/13/2013    History of Present Illness 73 yo with 1-2 month history of gait instability that has progressively worsened to the point that he is unable to ambulate without consistently falling to the right.   Clinical Impression   PTA, pt independent with ADL. Pt reports recent balance deficits that became worse this past Tuesday after he learned about his mother passing away. No ataxia noted with BUE or trunk. Coordination and strength WFL. Standing balance impairment, especially with fatigue - not consistent. Pt discussed feeling of loss and depression and stated "my entire family  is gone". Of note, pt's mother became ill @ 2 months ago and passed away this past 12-12-2022. At this time recommend psych consult. Also recommend D/C home with HHOT and 24/7 S. Will follow acutely.    Follow Up Recommendations  Home health OT;Supervision/Assistance - 24 hour    Equipment Recommendations  3 in 1 bedside comode;Other (comment) (RW)    Recommendations for Other Services  Psych consult     Precautions / Restrictions Precautions Precautions: Fall      Mobility Bed Mobility Overal bed mobility: Modified Independent                Transfers Overall transfer level: Needs assistance Equipment used: Rolling walker (2 wheeled) Transfers: Sit to/from Stand Sit to Stand: Min guard Stand pivot transfers: Min assist       General transfer comment: cues for hand placement with very close guarding for safety with LOB multiple times during session with pushing walker away    Balance Overall balance assessment: Needs assistance Sitting-balance support: Feet supported;No upper extremity supported Sitting balance-Leahy Scale: Good     Standing balance support: Bilateral upper extremity supported Standing balance-Leahy Scale: Poor                               ADL Overall ADL's : Needs assistance/impaired Eating/Feeding: Modified independent   Grooming: Supervision/safety;Set up   Upper Body Bathing: Supervision/ safety;Set up   Lower Body Bathing: Sit to/from stand;Min guard   Upper Body Dressing : Supervision/safety;Set up;Sitting   Lower Body Dressing: Min guard;Sit to/from stand   Toilet Transfer: Min guard;Ambulation   Toileting- Clothing Manipulation and Hygiene: Min guard;Sit to/from stand       Functional mobility during ADLs: Minimal assistance General ADL Comments: Pt able to sit EOB and complete simple tasks. Fatigued easily and asked to lie down. Able to ambulate with min guard. Discussing recent loss of mother.     Vision                     Perception     Praxis Praxis Praxis tested?: Within functional limits    Pertinent Vitals/Pain O2 97-100 RA Orthostatic BPs  Supine 111/78  Sitting 94/76  Sitting after  min   Standing   Standing after 1 min 112/54       Hand Dominance Right   Extremity/Trunk Assessment Upper Extremity Assessment Upper Extremity Assessment: Defer to OT evaluation   Lower Extremity Assessment Lower Extremity Assessment: Overall WFL for tasks assessed   Cervical / Trunk Assessment Cervical / Trunk Assessment: Kyphotic   Communication Communication Communication: No difficulties   Cognition Arousal/Alertness: Awake/alert Behavior During Therapy: WFL for tasks assessed/performed Overall Cognitive Status: no family available to determine baseline Area of Impairment: Safety/judgement  Safety/Judgement: Decreased awareness of safety;Decreased awareness of deficits     General Comments: Increased SOB when talking about his mother's recent death   General Comments       Exercises       Shoulder Instructions      Home Living Family/patient expects to be discharged to:: Private residence Living Arrangements: Spouse/significant other Available  Help at Discharge: Available 24 hours/day;Family (grandchildren can be there if needed) Type of Home: House Home Access: Stairs to enter [employer] of Steps: 1 Entrance Stairs-Rails: None Home Layout: One level     Bathroom Shower/Tub: Tub/shower unit (garden tub) Shower/tub characteristics: Architectural technologist: Standard Bathroom Accessibility: Yes How Accessible: Accessible via walker Home Equipment: Environmental consultant - 2 wheels;Cane - single point          Prior Functioning/Environment Level of Independence: Independent             OT Diagnosis: Generalized weakness   OT Problem List: Decreased activity tolerance;Impaired balance (sitting and/or standing);Decreased safety awareness;Decreased knowledge of use of DME or AE   OT Treatment/Interventions: Self-care/ADL training;Therapeutic exercise;Energy conservation;DME and/or AE instruction;Therapeutic activities;Patient/family education;Balance training    OT Goals(Current goals can be found in the care plan section) Acute Rehab OT Goals Patient Stated Goal: be able to take care of my grandson OT Goal Formulation: With patient Time For Goal Achievement: 11/27/13 Potential to Achieve Goals: Good  OT Frequency: Min 2X/week   Barriers to D/C:            Co-evaluation              End of Session Equipment Utilized During Treatment: Gait belt;Rolling walker Nurse Communication: Mobility status;Other (comment) (life situation)  Activity Tolerance: Patient tolerated treatment well Patient left: in bed;with call bell/phone within reach;with bed alarm set   Time: [phone] OT Time Calculation (min): 32 min Charges:  OT General Charges $OT Visit: 1 Procedure OT Evaluation $Initial OT Evaluation Tier I: 1 Procedure OT Treatments $Self Care/Home Management : 8-22 mins G-Codes:    WARD,HILLARY December 02, 2013, 10:52 AM   Maurie Boettcher, OTR/L  [phone] 2013-12-02

## 2013-11-13 NOTE — Progress Notes (Signed)
Utilization review completed.  

## 2013-11-14 DIAGNOSIS — G4700 Insomnia, unspecified: Secondary | ICD-10-CM | POA: Diagnosis present

## 2013-11-14 DIAGNOSIS — G3281 Cerebellar ataxia in diseases classified elsewhere: Secondary | ICD-10-CM | POA: Diagnosis not present

## 2013-11-14 DIAGNOSIS — I4891 Unspecified atrial fibrillation: Secondary | ICD-10-CM | POA: Diagnosis not present

## 2013-11-14 DIAGNOSIS — E538 Deficiency of other specified B group vitamins: Secondary | ICD-10-CM | POA: Diagnosis present

## 2013-11-14 DIAGNOSIS — R42 Dizziness and giddiness: Secondary | ICD-10-CM

## 2013-11-14 DIAGNOSIS — D531 Other megaloblastic anemias, not elsewhere classified: Secondary | ICD-10-CM | POA: Diagnosis not present

## 2013-11-14 DIAGNOSIS — E236 Other disorders of pituitary gland: Secondary | ICD-10-CM | POA: Diagnosis not present

## 2013-11-14 DIAGNOSIS — R279 Unspecified lack of coordination: Secondary | ICD-10-CM | POA: Diagnosis not present

## 2013-11-14 LAB — VITAMIN B12: VITAMIN B 12: 323 pg/mL (ref 211–911)

## 2013-11-14 MED ORDER — TRAZODONE HCL 100 MG PO TABS: 100.0000 mg | ORAL_TABLET | Freq: Every day | ORAL | Status: DC

## 2013-11-14 NOTE — Care Management Note (Signed)
    Page 1 of 1   11/14/2013     11:39:09 AM CARE MANAGEMENT NOTE 11/14/2013  Patient:  WOODS, GANGEMI   Account Number:  [id number]  Date Initiated:  11/14/2013  Documentation initiated by:  GRAVES-BIGELOW,BRENDA  Subjective/Objective Assessment:   Pt in with ataxia. Plan for d/c today. Pt states his mother just passed away and he will be driving to [place] for funeral preparation.     Action/Plan:   CM did not set any services up due to pt not being homebound at this time. Pt to contact office once back from visit. CM did make RN aware of no services.   Anticipated DC Date:  11/14/2013   Anticipated DC Plan:  [place]  CM consult      Choice offered to / List presented to:             Status of service:  Completed, signed off Medicare Important Message given?  NO (If response is "NO", the following Medicare IM given date fields will be blank) Date Medicare IM given:   Date Additional Medicare IM given:    Discharge Disposition:  HOME/SELF CARE  Per UR Regulation:  Reviewed for med. necessity/level of care/duration of stay  If discussed at Many of Stay Meetings, dates discussed:    Comments:

## 2013-11-14 NOTE — Progress Notes (Signed)
Occupational Therapy Treatment Patient Details Name: Todd Lloyd MRN: [id number] DOB: 09/28/1940 Today's Date: 11/14/2013    History of present illness 73 yo with 1-2 month history of gait instability that has progressively worsened to the point that he is unable to ambulate without consistently falling to the right.   OT comments  Pt with improved balance during functional mobility today.  OT instructed on energy conservation and home safety awareness.  Follow Up Recommendations  Home health OT;Supervision/Assistance - 24 hour    Equipment Recommendations  3 in 1 bedside comode (RW)    Recommendations for Other Services      Precautions / Restrictions Precautions Precautions: Fall       Mobility Bed Mobility Overal bed mobility: Modified Independent                Transfers Overall transfer level: Needs assistance Equipment used: Rolling walker (2 wheeled) Transfers: Sit to/from Stand Sit to Stand: Supervision              Balance                                   ADL Overall ADL's : Needs assistance/impaired     Grooming: Supervision/safety;Standing                   Toilet Transfer: Min guard;Ambulation           Functional mobility during ADLs: Min guard;Rolling walker General ADL Comments: Pt with no c/o fatigue during session. OT instructed on energy conservation and home safety awareness, emphasizing on pacing himself with activities.  Pt determined to drive to [place] ("to bury his mother") but verbalized understanding after OT recommended assist from family members.  Pt reporting at end of session that he plans to rest at home for several days before travelling. Pt requesting to ambulate around unit. No LOB or lean to right side noted during ambulation. Pt stopping several times to talk with OT with UEs unsupported.        Vision                     Perception     Praxis      Cognition   Behavior  During Therapy: [facility] for tasks assessed/performed Overall Cognitive Status: Impaired/Different from baseline Area of Impairment: Safety/judgement                General Comments: Pt initially telling therapist he plans to drive to [place] by himself when discharged in spite of hospital admission.    Extremity/Trunk Assessment               Exercises     Shoulder Instructions       General Comments      Pertinent Vitals/ Pain       No c/o pain  Home Living                                          Prior Functioning/Environment              Frequency Min 2X/week     Progress Toward Goals  OT Goals(current goals can now be found in the care plan section)  Progress towards OT goals: Progressing toward goals  Acute Rehab OT Goals Patient Stated Goal:  to go home OT Goal Formulation: With patient Time For Goal Achievement: 11/27/13 Potential to Achieve Goals: Good ADL Goals Pt Will Perform Lower Body Bathing: with modified independence;sit to/from stand Pt Will Perform Lower Body Dressing: with modified independence;sit to/from stand Pt Will Transfer to Toilet: with modified independence;bedside commode;ambulating Pt Will Perform Toileting - Clothing Manipulation and hygiene: with modified independence;sit to/from stand  Plan Discharge plan remains appropriate    Co-evaluation                 End of Session Equipment Utilized During Treatment: Gait belt;Rolling walker   Activity Tolerance Patient tolerated treatment well   Patient Left in bed;with call bell/phone within reach   Nurse Communication          Time: 1115-5208 OT Time Calculation (min): 27 min  Charges: OT General Charges $OT Visit: 1 Procedure OT Treatments $Self Care/Home Management : 8-22 mins $Therapeutic Activity: 8-22 mins  Darrol Jump 11/14/2013, 11:39 AM  11/14/2013 Darrol Jump OTR/L Pager [phone] Office [phone]

## 2013-11-14 NOTE — Discharge Instructions (Signed)
Ataxia You have an unsteady walk called ataxia. Your condition may require further tests. Ataxia can be caused by:  Neurological conditions.  Infections.  Physical exhaustion.  Internal bleeding.  Alcohol intoxication, or medication side effects.  Problems with circulation, blood pressure, and heart disease can also make you unsteady. Laboratory and X-ray tests may be needed.  Treatment for now:  Get plenty of rest and eat a nutritious diet over the next weeks.  Avoid alcohol.  If you become very unsteady, dizzy, nauseated, or feel like you are going to faint, lie down flat right away.  Wait until all your symptoms pass before you get up again. SEEK IMMEDIATE MEDICAL CARE IF:  You develop severe unsteadiness, headache, chest pain, or abdominal pain.  You have weakness or numbness on one side of your body.  You have problems with your vision.  You develop confusion or difficulty speaking.  You have a fever, chills, or an irregular heartbeat or a very fast pulse. MAKE SURE YOU:   Understand these instructions.  Will watch your condition.  Will get help right away if you are not doing well or get worse. Document Released: 05/08/2005 Document Revised: 07/31/2011 Document Reviewed: 10/25/2006 [employer] Patient Information 2015 [place], [place]. This information is not intended to replace advice given to you by your health care provider. Make sure you discuss any questions you have with your health care provider.

## 2013-11-14 NOTE — Discharge Summary (Signed)
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 Irregular heart rate   . Hypertension   . Acid reflux   . High cholesterol   . COPD (chronic obstructive pulmonary disease)   . Leukemia     Past Surgical History  Procedure Laterality Date  . Back surgery    . Abdominal surgery    . Cataract extraction      Discharge Medications:   Medication List    STOP taking these medications       cyclobenzaprine 10 MG tablet  Commonly known as:  FLEXERIL      TAKE these medications       albuterol 108 (90 BASE) MCG/ACT inhaler  Commonly known as:  PROVENTIL HFA;VENTOLIN HFA  Inhale 2 puffs into the lungs every 6 (six) hours as needed. For shortness of breath     budesonide-formoterol 160-4.5 MCG/ACT inhaler  Commonly known as:  SYMBICORT  Inhale 2 puffs into the lungs 2 (two) times daily.     digoxin 0.25 MG tablet  Commonly known as:  LANOXIN  Take 250 mcg by mouth daily.     donepezil 10 MG tablet  Commonly known as:  ARICEPT  Take 10 mg by mouth at bedtime.     meloxicam 7.5 MG tablet  Commonly known as:  MOBIC  Take 7.5 mg by mouth 2 (two) times daily as needed for pain.     MYRBETRIQ 50 MG Tb24 tablet  Generic drug:  mirabegron ER  Take 50 mg by mouth daily.     nitroGLYCERIN 0.4 MG SL tablet  Commonly known as:  NITROSTAT  Place 0.4 mg under the tongue every 5 (five) minutes as needed for chest pain.     omeprazole 20 MG capsule  Commonly known as:  PRILOSEC  Take 20 mg by mouth daily.     simvastatin 80 MG tablet  Commonly known as:  ZOCOR  Take 40 mg by mouth at bedtime.      tiotropium 18 MCG inhalation capsule  Commonly known as:  SPIRIVA  Place 18 mcg into inhaler and inhale daily.     traZODone 100 MG tablet  Commonly known as:  DESYREL  Take 1 tablet (100 mg total) by mouth at bedtime.        Hospital Procedures: Ct Head Wo Contrast  11/11/2013   CLINICAL DATA:  Weakness.  EXAM: CT HEAD WITHOUT CONTRAST  TECHNIQUE: Contiguous axial images were obtained from the base of the skull through the vertex without intravenous contrast.  COMPARISON:  None.  FINDINGS: Diffuse cerebral atrophy. Ventricular dilatation consistent with central atrophy. Low-attenuation changes in the deep white matter consistent with small vessel ischemia. No mass effect or midline shift. 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 No acute intracranial hemorrhage. Gray-white matter junctions are distinct. Basal cisterns are not effaced. Vascular calcifications. Postoperative changes in the globes. Visualized paranasal sinuses and mastoid air cells are not opacified. No depressed skull fractures.  IMPRESSION: No evidence of acute intracranial abnormality. Chronic atrophy and small vessel ischemic changes.   Electronically Signed   By: Lucienne Capers M.D.   On: 11/11/2013  22:39   Todd Lloyd Contrast  11/12/2013   CLINICAL DATA:  Dizziness and falls.  EXAM: MRI HEAD WITHOUT CONTRAST  TECHNIQUE: Multiplanar, multiecho pulse sequences of the brain and surrounding structures were obtained without intravenous contrast.  COMPARISON:  CT head 11/11/2013.  MRI cervical spine 02/17/2011.  FINDINGS: Prominent retrocerebellar cisterna magna is stable from 2012 Todd. There is generalized cerebral and cerebellar atrophy with prominence of the ventricles, cisterns, and sulci. There are scattered areas of subcortical and periventricular T2 and FLAIR hyperintensity consistent with chronic microvascular ischemic change of a mild-to-moderate degree.  There is no acute stroke, acute  hemorrhage, mass lesion, or extra-axial fluid. Mild ventricular enlargement fell secondary to atrophy. Calvarium intact. No subdural or epidural hematoma. No interhemispheric fluid collection.  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 This is a nonspecific abnormality.  Flow voids are maintained in the internal carotid, basilar, and vertebral arteries. Right vertebral is dominant with left vertebral ending in PICA. No tonsillar herniation. No acute sinus or mastoid disease. Negative orbits. No significant scalp hematoma.  IMPRESSION: Chronic changes as described. Atrophy and small vessel disease. No acute intracranial findings.  Empty sella, uncertain clinical significance.   Electronically Signed   By: Rolla Flatten M.D.   On: 11/12/2013 08:29   Echocardiogram (6/25)- Study Conclusions  - Left ventricle: The cavity size was normal. There was moderate focal basal and mild concentric hypertrophy. Systolic function was normal. The estimated ejection fraction was in the range of 60% to 65%. Wall motion was normal; there were no regional wall motion abnormalities. - Aortic valve: There was mild regurgitation. - Aorta: Ascending aortic diameter: 41 mm (S). - Ascending aorta: The ascending aorta was mildly dilated. - Mitral valve: There was trivial regurgitation.  Carotid Dopplers- ordered but not yet completed  History of Present Illness: Todd Lloyd is a 73 year old white male with a history of atrial fibrillation (not on anticoagulation), COPD, and hypertension who presented to the emergency department with the complaint of gait instability. He reports a one to two-month history of gait instability that is progressively worsened to the point that he is unable to cannulate without consistently falling to the right. He had a recent fall off a ladder but his symptoms preceded that. On the day of admission he was having difficulty sitting or standing without leaning to the  right and felt like he was bobbing or bumping into walls when he was trying to walk. He had no focal neurologic changes, weakness, or speech changes. He's had intermittent left-sided facial tingling which is improved in the past following cervical spine injections. No other neurologic changes. In the emergency department he had a head CT that showed no acute intracranial abnormalities. He was admitted for further evaluation.  Hospital Course: Patient was admitted initially to a telemetry bed on the hospitalist service for possible posterior circulation stroke. He underwent an MRI of the brain that showed no acute findings or evidence of stroke. Empty Sella of cunclear significance was noted. Transfer to the [facility] primary care service was requested. 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 Medication changes were made including discontinuation of Flexeril and decreasing his trazodone to 100 mg daily. Case was discussed with neurology. B12 level was obtained which is on the low end of normal. He will be provided at B12 injection prior to discharge and a methylmalonic acid was obtained and is pending.  Urine heavy metals were obtained and are pending at this time. An echocardiogram was performed and showed mild dilation of the descending aorta, mild aortic regurgitation, and normal ejection fraction. None of these are clinically significant. Carotid Dopplers were ordered and have not been obtained at this time. They cannot be done prior to discharge can be obtained as an outpatient if needed. On the morning of discharge the patient is feeling much better. He is able to sit and ambulate without significant unsteadiness. He does describe depression/grief related to the death of his mother last week. He plans a trip to [place] to bury her as soon as possible. With this, he is concerned about worsening insomnia as we have decreased  his trazodone dose. However, I think it is important to limit any medications that may increase his fall risk and I believe he understands this. At this point, the patient is stable for discharge home.  If no improvement in his ataxia will refer for outpatient evaluation. He was evaluated by physical therapy and occupational therapy with no equipment recommendations other than a 3 in one commode which will be ordered. I will arrange home health safety evaluation. Consider outpatient physical therapy as well.  Day of Discharge Exam BP 165/81  Pulse 82  Temp(Src) 98.1 F (36.7 C) (Oral)  Resp 20  Ht 6' (1.829 m)  Wt 81.829 kg (180 lb 6.4 oz)  BMI 24.46 kg/m2  SpO2 94%  Physical Exam: General appearance: alert and no distress Eyes: no scleral icterus Throat: oropharynx moist without erythema Resp: clear to auscultation bilaterally Cardio: irregularly irregular rhythm GI: soft, non-tender; bowel sounds normal; no masses,  no organomegaly Extremities: no clubbing, cyanosis or edema Neurologic: no focal neurologic deficits; negative Romberg today; able to ambulate and walk without unsteadiness though still shuffles slightly.  Discharge Labs:  Recent Labs  11/11/13 2123 11/13/13 0343  NA 138 138  K 4.7 4.3  CL 97 97  CO2 29 28  GLUCOSE 117* 106*  BUN 14 20  CREATININE 1.19 1.20  CALCIUM 10.0 9.1    Recent Labs  11/11/13 2123  AST 24  ALT 19  ALKPHOS 87  BILITOT 0.6  PROT 6.9  ALBUMIN 4.1    Recent Labs  11/11/13 2123  WBC 5.9  NEUTROABS 4.4  HGB 15.6  HCT 45.6  MCV 95.2  PLT 149*   Lab Results  Component Value Date   INR 1.08 11/12/2013    Recent Labs  11/13/13 1001  CKTOTAL 80    Recent Labs  11/13/13 0805  VITAMINB12 330    Discharge instructions:     Discharge Instructions   Diet - low sodium heart healthy    Complete by:  As directed      Discharge instructions    Complete by:  As directed   Fall precautions-use walker for the next  couple of days.  Call if you have more balance issues.     Increase activity slowly    Complete by:  As directed            Disposition: to home  Follow-up Appts: Follow-up with Dr. Osborne Casco at [facility] in 1 week.  Our office will call for TOC visit.  Condition on Discharge: to home with [facility] Evaluation and 24 hour monitoring  Tests Needing Follow-up:  Methylmalonic Acid  Time with discharge activities: 30 minutes  Signed: Marton Lloyd 11/14/2013, 7:21 AM

## 2013-11-14 NOTE — Progress Notes (Signed)
Subjective: Patient had no complaints. Gait was stable per physical therapy with use of walker.  Objective: Current vital signs: BP 150/85  Pulse 95  Temp(Src) 97.9 F (36.6 C) (Oral)  Resp 18  Ht 6' (1.829 m)  Wt 81.829 kg (180 lb 6.4 oz)  BMI 24.46 kg/m2  SpO2 98%  Neurologic Exam: Alert and in no acute distress. Mental status was normal. No ptosis of eyelids noted today. Speech was normal.  Laboratory studies:  Vitamin B12 level was normal. ESR and serum CK were normal. Myasthenia gravis panel results are pending.  Medications: I have reviewed the patient's current medications.  Assessment/Plan: Etiology for mild proximal weakness and gait instability remains unclear. There was no sign of stroke per MRI study. He also has no signs of myopathy. Vitamin B12 level was normal. He has no clinical indications of cerebellar degeneration nor signs on imaging study of his brain.  No objection to discharge to home since patient is ambulating stably with a walker. Followup with primary care doctor recommended. If myasthenia gravis panel results are abnormal, recommend outpatient neurology referral for management.  C.R. Nicole Kindred, MD [facility] [phone]  11/14/2013  10:12 AM

## 2013-11-14 NOTE — Progress Notes (Signed)
VASCULAR LAB PRELIMINARY  PRELIMINARY  PRELIMINARY  PRELIMINARY  Carotid duplex completed.    Preliminary report: Mild plaque in the left proximal ICA.  1-39% stenosis bilaterally.  Vertebral artery flow  Antegrade.  Todd Lloyd, RVT 11/14/2013, 10:19 AM

## 2013-11-14 NOTE — Progress Notes (Signed)
Pt discharged home with family.  Assessment unchanged from earlier.  Reviewed discharge education and information, all questions answered.

## 2013-11-16 LAB — HEAVY METALS, RANDOM URINE
Arsenic Random, Urine: 2 mcg/g creat (ref <51)
CREATRANDUR: 157.5 mg/dL (ref 20.0–370.0)

## 2013-11-16 LAB — METHYLMALONIC ACID, SERUM: METHYLMALONIC ACID, QUANTITATIVE: 164 nmol/L (ref 87–318)

## 2013-11-17 NOTE — Progress Notes (Signed)
Progress NOted addendum for 11-30-2013  30-Nov-2013 0957  PT G-Codes **NOT FOR INPATIENT CLASS**  Functional Assessment Tool Used clinical judgement  Functional Limitation Mobility: Walking and moving around  Mobility: Walking and Moving Around Current Status [phone]) CJ  Mobility: Walking and Moving Around Goal Status (S8110) CI  Elwyn Reach, [place]

## 2013-11-18 LAB — HEAVY METALS SCREEN, URINE

## 2013-11-22 LAB — MYASTHENIA GRAVIS PANEL 2: Acetylcholine Modulat Ab: 6 %

## 2013-12-12 NOTE — Progress Notes (Signed)
OT Note -Addendum  11/19/13 1110  OT Visit Information  Last OT Received On 11/19/2013  OT G-codes **NOT FOR INPATIENT CLASS**  Functional Assessment Tool Used clinical judgement  Functional Limitation Self care  Self Care Current Status [phone]) CI  Self Care Goal Status [phone]) CI  [facility], OTR/L  [phone] 12/12/2013

## 2014-02-09 DIAGNOSIS — E538 Deficiency of other specified B group vitamins: Secondary | ICD-10-CM | POA: Diagnosis not present

## 2014-02-09 DIAGNOSIS — B0229 Other postherpetic nervous system involvement: Secondary | ICD-10-CM | POA: Diagnosis not present

## 2014-02-09 DIAGNOSIS — J449 Chronic obstructive pulmonary disease, unspecified: Secondary | ICD-10-CM | POA: Diagnosis not present

## 2014-02-09 DIAGNOSIS — R413 Other amnesia: Secondary | ICD-10-CM | POA: Diagnosis not present

## 2014-02-09 DIAGNOSIS — K219 Gastro-esophageal reflux disease without esophagitis: Secondary | ICD-10-CM | POA: Diagnosis not present

## 2014-02-09 DIAGNOSIS — I10 Essential (primary) hypertension: Secondary | ICD-10-CM | POA: Diagnosis not present

## 2014-02-09 DIAGNOSIS — Z23 Encounter for immunization: Secondary | ICD-10-CM | POA: Diagnosis not present

## 2014-02-09 DIAGNOSIS — N401 Enlarged prostate with lower urinary tract symptoms: Secondary | ICD-10-CM | POA: Diagnosis not present

## 2014-02-09 DIAGNOSIS — N529 Male erectile dysfunction, unspecified: Secondary | ICD-10-CM | POA: Diagnosis not present

## 2014-02-09 DIAGNOSIS — E785 Hyperlipidemia, unspecified: Secondary | ICD-10-CM | POA: Diagnosis not present

## 2014-02-18 DIAGNOSIS — IMO0002 Reserved for concepts with insufficient information to code with codable children: Secondary | ICD-10-CM | POA: Diagnosis not present

## 2014-02-18 DIAGNOSIS — M47817 Spondylosis without myelopathy or radiculopathy, lumbosacral region: Secondary | ICD-10-CM | POA: Diagnosis not present

## 2014-02-18 DIAGNOSIS — M48061 Spinal stenosis, lumbar region without neurogenic claudication: Secondary | ICD-10-CM | POA: Diagnosis not present

## 2014-02-27 DIAGNOSIS — N529 Male erectile dysfunction, unspecified: Secondary | ICD-10-CM | POA: Diagnosis not present

## 2014-02-27 DIAGNOSIS — N400 Enlarged prostate without lower urinary tract symptoms: Secondary | ICD-10-CM | POA: Diagnosis not present

## 2014-02-27 DIAGNOSIS — R350 Frequency of micturition: Secondary | ICD-10-CM | POA: Diagnosis not present

## 2014-03-27 DIAGNOSIS — H60331 Swimmer's ear, right ear: Secondary | ICD-10-CM | POA: Diagnosis not present

## 2014-03-27 DIAGNOSIS — H6123 Impacted cerumen, bilateral: Secondary | ICD-10-CM | POA: Diagnosis not present

## 2014-04-07 DIAGNOSIS — M5136 Other intervertebral disc degeneration, lumbar region: Secondary | ICD-10-CM | POA: Diagnosis not present

## 2014-04-07 DIAGNOSIS — M5137 Other intervertebral disc degeneration, lumbosacral region: Secondary | ICD-10-CM | POA: Diagnosis not present

## 2014-04-07 DIAGNOSIS — M549 Dorsalgia, unspecified: Secondary | ICD-10-CM | POA: Diagnosis not present

## 2014-04-07 DIAGNOSIS — Z6825 Body mass index (BMI) 25.0-25.9, adult: Secondary | ICD-10-CM | POA: Diagnosis not present

## 2014-05-05 DIAGNOSIS — E785 Hyperlipidemia, unspecified: Secondary | ICD-10-CM | POA: Diagnosis not present

## 2014-05-05 DIAGNOSIS — Z125 Encounter for screening for malignant neoplasm of prostate: Secondary | ICD-10-CM | POA: Diagnosis not present

## 2014-05-05 DIAGNOSIS — D539 Nutritional anemia, unspecified: Secondary | ICD-10-CM | POA: Diagnosis not present

## 2014-05-05 DIAGNOSIS — I10 Essential (primary) hypertension: Secondary | ICD-10-CM | POA: Diagnosis not present

## 2014-05-12 DIAGNOSIS — N529 Male erectile dysfunction, unspecified: Secondary | ICD-10-CM | POA: Diagnosis not present

## 2014-05-12 DIAGNOSIS — B0229 Other postherpetic nervous system involvement: Secondary | ICD-10-CM | POA: Diagnosis not present

## 2014-05-12 DIAGNOSIS — Z0000 Encounter for general adult medical examination without abnormal findings: Secondary | ICD-10-CM | POA: Diagnosis not present

## 2014-05-12 DIAGNOSIS — E785 Hyperlipidemia, unspecified: Secondary | ICD-10-CM | POA: Diagnosis not present

## 2014-05-12 DIAGNOSIS — K219 Gastro-esophageal reflux disease without esophagitis: Secondary | ICD-10-CM | POA: Diagnosis not present

## 2014-05-12 DIAGNOSIS — I10 Essential (primary) hypertension: Secondary | ICD-10-CM | POA: Diagnosis not present

## 2014-05-12 DIAGNOSIS — Z6825 Body mass index (BMI) 25.0-25.9, adult: Secondary | ICD-10-CM | POA: Diagnosis not present

## 2014-05-12 DIAGNOSIS — N401 Enlarged prostate with lower urinary tract symptoms: Secondary | ICD-10-CM | POA: Diagnosis not present

## 2014-05-12 DIAGNOSIS — R413 Other amnesia: Secondary | ICD-10-CM | POA: Diagnosis not present

## 2014-05-27 DIAGNOSIS — R350 Frequency of micturition: Secondary | ICD-10-CM | POA: Diagnosis not present

## 2014-06-03 DIAGNOSIS — R350 Frequency of micturition: Secondary | ICD-10-CM | POA: Diagnosis not present

## 2014-06-10 DIAGNOSIS — R350 Frequency of micturition: Secondary | ICD-10-CM | POA: Diagnosis not present

## 2014-06-24 DIAGNOSIS — R350 Frequency of micturition: Secondary | ICD-10-CM | POA: Diagnosis not present

## 2014-07-01 DIAGNOSIS — R350 Frequency of micturition: Secondary | ICD-10-CM | POA: Diagnosis not present

## 2014-07-08 DIAGNOSIS — R350 Frequency of micturition: Secondary | ICD-10-CM | POA: Diagnosis not present

## 2014-07-08 DIAGNOSIS — N401 Enlarged prostate with lower urinary tract symptoms: Secondary | ICD-10-CM | POA: Diagnosis not present

## 2014-07-08 DIAGNOSIS — R3916 Straining to void: Secondary | ICD-10-CM | POA: Diagnosis not present

## 2014-07-15 DIAGNOSIS — R350 Frequency of micturition: Secondary | ICD-10-CM | POA: Diagnosis not present

## 2014-07-29 DIAGNOSIS — R350 Frequency of micturition: Secondary | ICD-10-CM | POA: Diagnosis not present

## 2014-08-05 DIAGNOSIS — R350 Frequency of micturition: Secondary | ICD-10-CM | POA: Diagnosis not present

## 2014-08-12 DIAGNOSIS — R350 Frequency of micturition: Secondary | ICD-10-CM | POA: Diagnosis not present

## 2014-08-19 DIAGNOSIS — R350 Frequency of micturition: Secondary | ICD-10-CM | POA: Diagnosis not present

## 2014-09-02 DIAGNOSIS — R350 Frequency of micturition: Secondary | ICD-10-CM | POA: Diagnosis not present

## 2014-09-10 DIAGNOSIS — E785 Hyperlipidemia, unspecified: Secondary | ICD-10-CM | POA: Diagnosis not present

## 2014-09-10 DIAGNOSIS — I482 Chronic atrial fibrillation: Secondary | ICD-10-CM | POA: Diagnosis not present

## 2014-09-10 DIAGNOSIS — I2510 Atherosclerotic heart disease of native coronary artery without angina pectoris: Secondary | ICD-10-CM | POA: Diagnosis not present

## 2014-09-14 DIAGNOSIS — I482 Chronic atrial fibrillation: Secondary | ICD-10-CM | POA: Diagnosis not present

## 2014-09-24 DIAGNOSIS — R197 Diarrhea, unspecified: Secondary | ICD-10-CM | POA: Diagnosis not present

## 2014-09-24 DIAGNOSIS — I4891 Unspecified atrial fibrillation: Secondary | ICD-10-CM | POA: Diagnosis not present

## 2014-09-24 DIAGNOSIS — I480 Paroxysmal atrial fibrillation: Secondary | ICD-10-CM | POA: Diagnosis not present

## 2014-09-24 DIAGNOSIS — Z6826 Body mass index (BMI) 26.0-26.9, adult: Secondary | ICD-10-CM | POA: Diagnosis not present

## 2014-09-30 DIAGNOSIS — R350 Frequency of micturition: Secondary | ICD-10-CM | POA: Diagnosis not present

## 2014-10-08 DIAGNOSIS — I482 Chronic atrial fibrillation: Secondary | ICD-10-CM | POA: Diagnosis not present

## 2014-10-08 DIAGNOSIS — E785 Hyperlipidemia, unspecified: Secondary | ICD-10-CM | POA: Diagnosis not present

## 2014-10-08 DIAGNOSIS — I2510 Atherosclerotic heart disease of native coronary artery without angina pectoris: Secondary | ICD-10-CM | POA: Diagnosis not present

## 2014-10-21 DIAGNOSIS — R350 Frequency of micturition: Secondary | ICD-10-CM | POA: Diagnosis not present

## 2014-11-10 DIAGNOSIS — N3281 Overactive bladder: Secondary | ICD-10-CM | POA: Diagnosis not present

## 2014-11-10 DIAGNOSIS — E538 Deficiency of other specified B group vitamins: Secondary | ICD-10-CM | POA: Diagnosis not present

## 2014-11-10 DIAGNOSIS — J449 Chronic obstructive pulmonary disease, unspecified: Secondary | ICD-10-CM | POA: Diagnosis not present

## 2014-11-10 DIAGNOSIS — Z79899 Other long term (current) drug therapy: Secondary | ICD-10-CM | POA: Diagnosis not present

## 2014-11-10 DIAGNOSIS — C92Z1 Other myeloid leukemia, in remission: Secondary | ICD-10-CM | POA: Diagnosis not present

## 2014-11-10 DIAGNOSIS — E785 Hyperlipidemia, unspecified: Secondary | ICD-10-CM | POA: Diagnosis not present

## 2014-11-10 DIAGNOSIS — D692 Other nonthrombocytopenic purpura: Secondary | ICD-10-CM | POA: Diagnosis not present

## 2014-11-10 DIAGNOSIS — Z6826 Body mass index (BMI) 26.0-26.9, adult: Secondary | ICD-10-CM | POA: Diagnosis not present

## 2014-11-10 DIAGNOSIS — I480 Paroxysmal atrial fibrillation: Secondary | ICD-10-CM | POA: Diagnosis not present

## 2014-11-10 DIAGNOSIS — Z7901 Long term (current) use of anticoagulants: Secondary | ICD-10-CM | POA: Diagnosis not present

## 2014-11-10 DIAGNOSIS — F0390 Unspecified dementia without behavioral disturbance: Secondary | ICD-10-CM | POA: Diagnosis not present

## 2014-11-10 DIAGNOSIS — I10 Essential (primary) hypertension: Secondary | ICD-10-CM | POA: Diagnosis not present

## 2014-11-11 DIAGNOSIS — R350 Frequency of micturition: Secondary | ICD-10-CM | POA: Diagnosis not present

## 2014-11-12 DIAGNOSIS — H35371 Puckering of macula, right eye: Secondary | ICD-10-CM | POA: Diagnosis not present

## 2014-11-12 DIAGNOSIS — H3531 Nonexudative age-related macular degeneration: Secondary | ICD-10-CM | POA: Diagnosis not present

## 2014-11-12 DIAGNOSIS — H4912 Fourth [trochlear] nerve palsy, left eye: Secondary | ICD-10-CM | POA: Diagnosis not present

## 2014-11-12 DIAGNOSIS — H31003 Unspecified chorioretinal scars, bilateral: Secondary | ICD-10-CM | POA: Diagnosis not present

## 2014-11-13 DIAGNOSIS — C44319 Basal cell carcinoma of skin of other parts of face: Secondary | ICD-10-CM | POA: Diagnosis not present

## 2014-11-13 DIAGNOSIS — Z85828 Personal history of other malignant neoplasm of skin: Secondary | ICD-10-CM | POA: Diagnosis not present

## 2014-11-13 DIAGNOSIS — L570 Actinic keratosis: Secondary | ICD-10-CM | POA: Diagnosis not present

## 2014-11-13 DIAGNOSIS — D485 Neoplasm of uncertain behavior of skin: Secondary | ICD-10-CM | POA: Diagnosis not present

## 2014-11-13 DIAGNOSIS — C44612 Basal cell carcinoma of skin of right upper limb, including shoulder: Secondary | ICD-10-CM | POA: Diagnosis not present

## 2014-11-13 DIAGNOSIS — D692 Other nonthrombocytopenic purpura: Secondary | ICD-10-CM | POA: Diagnosis not present

## 2014-11-13 DIAGNOSIS — C44519 Basal cell carcinoma of skin of other part of trunk: Secondary | ICD-10-CM | POA: Diagnosis not present

## 2014-11-13 DIAGNOSIS — L821 Other seborrheic keratosis: Secondary | ICD-10-CM | POA: Diagnosis not present

## 2014-11-13 DIAGNOSIS — D3617 Benign neoplasm of peripheral nerves and autonomic nervous system of trunk, unspecified: Secondary | ICD-10-CM | POA: Diagnosis not present

## 2014-11-13 DIAGNOSIS — L723 Sebaceous cyst: Secondary | ICD-10-CM | POA: Diagnosis not present

## 2014-11-13 DIAGNOSIS — D1801 Hemangioma of skin and subcutaneous tissue: Secondary | ICD-10-CM | POA: Diagnosis not present

## 2014-11-24 DIAGNOSIS — M25552 Pain in left hip: Secondary | ICD-10-CM | POA: Diagnosis not present

## 2014-11-24 DIAGNOSIS — M170 Bilateral primary osteoarthritis of knee: Secondary | ICD-10-CM | POA: Diagnosis not present

## 2014-11-26 DIAGNOSIS — Z85828 Personal history of other malignant neoplasm of skin: Secondary | ICD-10-CM | POA: Diagnosis not present

## 2014-11-26 DIAGNOSIS — C44519 Basal cell carcinoma of skin of other part of trunk: Secondary | ICD-10-CM | POA: Diagnosis not present

## 2014-12-02 DIAGNOSIS — R350 Frequency of micturition: Secondary | ICD-10-CM | POA: Diagnosis not present

## 2014-12-08 DIAGNOSIS — M5030 Other cervical disc degeneration, unspecified cervical region: Secondary | ICD-10-CM | POA: Diagnosis not present

## 2014-12-08 DIAGNOSIS — M25512 Pain in left shoulder: Secondary | ICD-10-CM | POA: Diagnosis not present

## 2014-12-08 DIAGNOSIS — M25511 Pain in right shoulder: Secondary | ICD-10-CM | POA: Diagnosis not present

## 2014-12-10 DIAGNOSIS — C44319 Basal cell carcinoma of skin of other parts of face: Secondary | ICD-10-CM | POA: Diagnosis not present

## 2014-12-10 DIAGNOSIS — Z85828 Personal history of other malignant neoplasm of skin: Secondary | ICD-10-CM | POA: Diagnosis not present

## 2014-12-14 DIAGNOSIS — M25552 Pain in left hip: Secondary | ICD-10-CM | POA: Diagnosis not present

## 2014-12-15 DIAGNOSIS — M542 Cervicalgia: Secondary | ICD-10-CM | POA: Diagnosis not present

## 2014-12-23 DIAGNOSIS — R350 Frequency of micturition: Secondary | ICD-10-CM | POA: Diagnosis not present

## 2014-12-28 DIAGNOSIS — M170 Bilateral primary osteoarthritis of knee: Secondary | ICD-10-CM | POA: Diagnosis not present

## 2014-12-28 DIAGNOSIS — M542 Cervicalgia: Secondary | ICD-10-CM | POA: Diagnosis not present

## 2014-12-28 DIAGNOSIS — M25552 Pain in left hip: Secondary | ICD-10-CM | POA: Diagnosis not present

## 2015-01-04 DIAGNOSIS — M170 Bilateral primary osteoarthritis of knee: Secondary | ICD-10-CM | POA: Diagnosis not present

## 2015-01-06 DIAGNOSIS — M47812 Spondylosis without myelopathy or radiculopathy, cervical region: Secondary | ICD-10-CM | POA: Diagnosis not present

## 2015-01-06 DIAGNOSIS — M542 Cervicalgia: Secondary | ICD-10-CM | POA: Diagnosis not present

## 2015-01-07 DIAGNOSIS — I482 Chronic atrial fibrillation: Secondary | ICD-10-CM | POA: Diagnosis not present

## 2015-01-07 DIAGNOSIS — I2510 Atherosclerotic heart disease of native coronary artery without angina pectoris: Secondary | ICD-10-CM | POA: Diagnosis not present

## 2015-01-07 DIAGNOSIS — J449 Chronic obstructive pulmonary disease, unspecified: Secondary | ICD-10-CM | POA: Diagnosis not present

## 2015-01-07 DIAGNOSIS — E785 Hyperlipidemia, unspecified: Secondary | ICD-10-CM | POA: Diagnosis not present

## 2015-01-08 DIAGNOSIS — I482 Chronic atrial fibrillation: Secondary | ICD-10-CM | POA: Diagnosis not present

## 2015-01-11 DIAGNOSIS — M170 Bilateral primary osteoarthritis of knee: Secondary | ICD-10-CM | POA: Diagnosis not present

## 2015-01-12 DIAGNOSIS — H4912 Fourth [trochlear] nerve palsy, left eye: Secondary | ICD-10-CM | POA: Diagnosis not present

## 2015-01-12 DIAGNOSIS — M479 Spondylosis, unspecified: Secondary | ICD-10-CM | POA: Diagnosis not present

## 2015-01-12 DIAGNOSIS — M542 Cervicalgia: Secondary | ICD-10-CM | POA: Diagnosis not present

## 2015-01-13 DIAGNOSIS — R350 Frequency of micturition: Secondary | ICD-10-CM | POA: Diagnosis not present

## 2015-01-15 DIAGNOSIS — S80861A Insect bite (nonvenomous), right lower leg, initial encounter: Secondary | ICD-10-CM | POA: Diagnosis not present

## 2015-01-15 DIAGNOSIS — W57XXXA Bitten or stung by nonvenomous insect and other nonvenomous arthropods, initial encounter: Secondary | ICD-10-CM | POA: Diagnosis not present

## 2015-01-15 DIAGNOSIS — Z6826 Body mass index (BMI) 26.0-26.9, adult: Secondary | ICD-10-CM | POA: Diagnosis not present

## 2015-01-18 DIAGNOSIS — C44519 Basal cell carcinoma of skin of other part of trunk: Secondary | ICD-10-CM | POA: Diagnosis not present

## 2015-01-18 DIAGNOSIS — Z85828 Personal history of other malignant neoplasm of skin: Secondary | ICD-10-CM | POA: Diagnosis not present

## 2015-02-01 DIAGNOSIS — L0889 Other specified local infections of the skin and subcutaneous tissue: Secondary | ICD-10-CM | POA: Diagnosis not present

## 2015-02-03 DIAGNOSIS — R350 Frequency of micturition: Secondary | ICD-10-CM | POA: Diagnosis not present

## 2015-02-09 DIAGNOSIS — M47812 Spondylosis without myelopathy or radiculopathy, cervical region: Secondary | ICD-10-CM | POA: Diagnosis not present

## 2015-02-09 DIAGNOSIS — M542 Cervicalgia: Secondary | ICD-10-CM | POA: Diagnosis not present

## 2015-02-09 DIAGNOSIS — M5412 Radiculopathy, cervical region: Secondary | ICD-10-CM | POA: Diagnosis not present

## 2015-03-03 DIAGNOSIS — R350 Frequency of micturition: Secondary | ICD-10-CM | POA: Diagnosis not present

## 2015-03-05 DIAGNOSIS — R350 Frequency of micturition: Secondary | ICD-10-CM | POA: Diagnosis not present

## 2015-03-05 DIAGNOSIS — N401 Enlarged prostate with lower urinary tract symptoms: Secondary | ICD-10-CM | POA: Diagnosis not present

## 2015-03-05 DIAGNOSIS — R3916 Straining to void: Secondary | ICD-10-CM | POA: Diagnosis not present

## 2015-03-18 DIAGNOSIS — R0602 Shortness of breath: Secondary | ICD-10-CM | POA: Diagnosis not present

## 2015-03-18 DIAGNOSIS — Z6825 Body mass index (BMI) 25.0-25.9, adult: Secondary | ICD-10-CM | POA: Diagnosis not present

## 2015-03-18 DIAGNOSIS — J449 Chronic obstructive pulmonary disease, unspecified: Secondary | ICD-10-CM | POA: Diagnosis not present

## 2015-03-18 DIAGNOSIS — I480 Paroxysmal atrial fibrillation: Secondary | ICD-10-CM | POA: Diagnosis not present

## 2015-03-18 DIAGNOSIS — I10 Essential (primary) hypertension: Secondary | ICD-10-CM | POA: Diagnosis not present

## 2015-03-18 DIAGNOSIS — R0609 Other forms of dyspnea: Secondary | ICD-10-CM | POA: Diagnosis not present

## 2015-03-19 ENCOUNTER — Other Ambulatory Visit (INDEPENDENT_AMBULATORY_CARE_PROVIDER_SITE_OTHER): Payer: Medicare Other

## 2015-03-19 ENCOUNTER — Encounter: Payer: Self-pay | Admitting: Emergency Medicine

## 2015-03-19 ENCOUNTER — Ambulatory Visit (INDEPENDENT_AMBULATORY_CARE_PROVIDER_SITE_OTHER): Payer: Medicare Other | Admitting: Emergency Medicine

## 2015-03-19 VITALS — BP 110/70 | HR 80 | Ht 72.0 in | Wt 186.0 lb

## 2015-03-19 DIAGNOSIS — E538 Deficiency of other specified B group vitamins: Secondary | ICD-10-CM

## 2015-03-19 DIAGNOSIS — I482 Chronic atrial fibrillation: Secondary | ICD-10-CM | POA: Diagnosis not present

## 2015-03-19 DIAGNOSIS — R0600 Dyspnea, unspecified: Secondary | ICD-10-CM | POA: Diagnosis not present

## 2015-03-19 DIAGNOSIS — I2510 Atherosclerotic heart disease of native coronary artery without angina pectoris: Secondary | ICD-10-CM | POA: Diagnosis not present

## 2015-03-19 DIAGNOSIS — J449 Chronic obstructive pulmonary disease, unspecified: Secondary | ICD-10-CM

## 2015-03-19 DIAGNOSIS — E785 Hyperlipidemia, unspecified: Secondary | ICD-10-CM | POA: Diagnosis not present

## 2015-03-19 DIAGNOSIS — R0602 Shortness of breath: Secondary | ICD-10-CM | POA: Diagnosis not present

## 2015-03-19 DIAGNOSIS — R0609 Other forms of dyspnea: Secondary | ICD-10-CM

## 2015-03-19 LAB — CBC WITH DIFFERENTIAL/PLATELET
BASOS PCT: 0.2 % (ref 0.0–3.0)
Basophils Absolute: 0 10*3/uL (ref 0.0–0.1)
Eosinophils Absolute: 0 10*3/uL (ref 0.0–0.7)
Eosinophils Relative: 0 % (ref 0.0–5.0)
HEMATOCRIT: 45.5 % (ref 39.0–52.0)
HEMOGLOBIN: 14.7 g/dL (ref 13.0–17.0)
LYMPHS PCT: 9.6 % — AB (ref 12.0–46.0)
Lymphs Abs: 1.2 10*3/uL (ref 0.7–4.0)
MCHC: 32.3 g/dL (ref 30.0–36.0)
MCV: 99.8 fl (ref 78.0–100.0)
Monocytes Absolute: 1.2 10*3/uL — ABNORMAL HIGH (ref 0.1–1.0)
Monocytes Relative: 9.9 % (ref 3.0–12.0)
Neutro Abs: 9.9 10*3/uL — ABNORMAL HIGH (ref 1.4–7.7)
Neutrophils Relative %: 80.3 % — ABNORMAL HIGH (ref 43.0–77.0)
Platelets: 214 10*3/uL (ref 150.0–400.0)
RBC: 4.56 Mil/uL (ref 4.22–5.81)
RDW: 13.3 % (ref 11.5–15.5)
WBC: 12.4 10*3/uL — AB (ref 4.0–10.5)

## 2015-03-19 LAB — VITAMIN B12: Vitamin B-12: 304 pg/mL (ref 211–911)

## 2015-03-19 LAB — TSH: TSH: 2.15 u[IU]/mL (ref 0.35–4.50)

## 2015-03-19 NOTE — Assessment & Plan Note (Signed)
Suspect that there is a significant component progressive COPD but must also rule out other contributors. His chest x-ray from 10/27 was reported as clear so suspicion for interstitial lung disease is low. He does have a history of coronary disease and atrial fibrillation either which could be a contributor. He also has a history of a hematological malignancy - I will check his CBC today. Also will check his B-12 level and TSH. Walking oximetry today to screen for hypoxemia. He has an appointment with [name] to pursue any potential cardiac contribution

## 2015-03-19 NOTE — Progress Notes (Signed)
Subjective:    Patient ID: Todd Lloyd, male    DOB: Dec 31, 1940, 74 y.o.   MRN: [id number]  HPI 74 year old man, former smoker (50+    pack years), with a history of CAD, hypertension, atrial fibrillation, GERD, CML post bone marrow transplant in 1990, and COPD that was diagnosed approximately 10 yrs ago and has been managed on Spiriva and Symbicort. He is referred today by Dr. Osborne Casco for progressive exertional dyspnea. 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 He can walk about 1 block before stopping to rest which is a change. No cough. He does hear wheeze, both w exertion and at rest. No CP. He just had a reassuring cards eval by Dr Wynonia Lawman 2 months, but is planning to see him again today.  CXR 10/27 w Dr Osborne Casco reported as clear. He is using SABA with some freq when doing yard work.        He was just treated with a prednisone taper beginning yesterday 03/18/15, first dose yesterday, hasn't noticed any changes.                                              Review of Systems  Constitutional: Negative for fever and unexpected weight change.  HENT: Negative for congestion, dental problem, ear pain, nosebleeds, postnasal drip, rhinorrhea, sinus pressure, sneezing, sore throat and trouble swallowing.   Eyes: Negative for redness and itching.  Respiratory: Positive for shortness of breath. Negative for cough, chest tightness and wheezing.   Cardiovascular: Negative for palpitations and leg swelling.  Gastrointestinal: Negative for nausea and vomiting.  Genitourinary: Negative for dysuria.  Musculoskeletal: Negative for joint swelling.  Skin: Negative for rash.  Neurological: Negative for headaches.  Hematological: Does not bruise/bleed easily.  Psychiatric/Behavioral: Negative for dysphoric mood. The patient is not nervous/anxious.    Past Medical  History  Diagnosis Date  . Irregular heart rate   . Hypertension   . Acid reflux   . High cholesterol   . COPD (chronic obstructive pulmonary disease) ([place])   . Leukemia ([place])      No family history on file.   Social History   Social History  . Marital Status: Married    Spouse Name: N/A  . Number of Children: N/A  . Years of Education: N/A   Occupational History  . Not on file.   Social History Main Topics  . Smoking status: Former Smoker -- 2.00 packs/day    Types: Cigarettes    Quit date: 05/22/1998  . Smokeless tobacco: Not on file  . Alcohol Use: No  . Drug Use: No  . Sexual Activity: Not on file   Other Topics Concern  . 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 albuterol (PROVENTIL HFA;VENTOLIN HFA) 108 (90 BASE) MCG/ACT inhaler Inhale 2 puffs into the lungs every 6 (six) hours as needed. For shortness of breath    . budesonide-formoterol (SYMBICORT) 160-4.5 MCG/ACT inhaler Inhale 2 puffs into the lungs 2 (two) times daily.    Marland Kitchen  digoxin (LANOXIN) 0.25 MG tablet Take 250 mcg by mouth daily.    Marland Kitchen donepezil (ARICEPT) 10 MG tablet Take 10 mg by mouth at bedtime.    Marland Kitchen omeprazole (PRILOSEC) 20 MG capsule Take 20 mg by mouth daily.    . simvastatin (ZOCOR) 80 MG tablet Take 40 mg by mouth at bedtime.    Marland Kitchen tiotropium (SPIRIVA) 18 MCG inhalation capsule Place 18 mcg into inhaler and inhale daily.    . traZODone (DESYREL) 100 MG tablet Take 1 tablet (100 mg total) by mouth at bedtime. 30 tablet 6  . meloxicam (MOBIC) 7.5 MG tablet Take 7.5 mg by mouth 2 (two) times daily as needed for pain.    . mirabegron ER (MYRBETRIQ) 50 MG TB24 tablet Take 50 mg by mouth daily.    . nitroGLYCERIN (NITROSTAT) 0.4 MG SL tablet Place 0.4 mg under the tongue every 5 (five) minutes as needed  for chest pain.     No facility-administered medications prior to visit.         Objective:   Physical Exam Filed Vitals:   03/19/15 0853 03/19/15 0855  BP:  110/70  Pulse:  80  Height: 6' (1.829 m)   Weight: 186 lb (84.369 kg)   SpO2:  97%   Gen: Pleasant, well-nourished, in no distress,  normal affect  ENT: No lesions,  mouth clear,  oropharynx clear, no postnasal drip  Neck: No JVD, no TMG, no carotid bruits  Lungs: No use of accessory muscles, distant, clear without rales or rhonchi  Cardiovascular: RRR, heart sounds normal, no murmur or gallops, no peripheral edema  Musculoskeletal: No deformities, no cyanosis or clubbing  Neuro: alert, non focal  Skin: Warm, no lesions or rashes      Assessment & Plan:  Dyspnea on exertion Suspect that there is a significant component progressive COPD but must also rule out other contributors. His chest x-ray from 10/27 was reported as clear so suspicion for interstitial lung disease is low. He does have a history of coronary disease and atrial fibrillation either which could be a contributor. He also has a history of a hematological malignancy - I will check his CBC today. Also will check his B-12 level and TSH. Walking oximetry today to screen for hypoxemia. He has an appointment with [name] to pursue any potential cardiac contribution  COPD (chronic obstructive pulmonary disease) ([place]) Walking oximetry today. Full PFT to quantify the degree of his obstruction. He is on a good bronchodilator regimen. I do not believe he has any evidence for acute exacerbation and I will stop his prednisone for now.

## 2015-03-19 NOTE — Assessment & Plan Note (Signed)
Walking oximetry today. Full PFT to quantify the degree of his obstruction. He is on a good bronchodilator regimen. I do not believe he has any evidence for acute exacerbation and I will stop his prednisone for now.

## 2015-03-19 NOTE — Patient Instructions (Signed)
Please continue your Spiriva and Symbicort as you have been taking them Take albuterol 2 puffs up to every 4 hours if needed for shortness of breath.  We will perform full pulmonary function testing We will obtain your chest x-ray from 03/18/15 for review Stop prednisone Walking oximetry today on room air Lab work today Follow with [name] as planned to ensure no contribution of heart disease to shortness of breath Follow with Dr Lamonte Sakai next available with full PFT

## 2015-03-22 DIAGNOSIS — D485 Neoplasm of uncertain behavior of skin: Secondary | ICD-10-CM | POA: Diagnosis not present

## 2015-03-22 DIAGNOSIS — D692 Other nonthrombocytopenic purpura: Secondary | ICD-10-CM | POA: Diagnosis not present

## 2015-03-22 DIAGNOSIS — C4441 Basal cell carcinoma of skin of scalp and neck: Secondary | ICD-10-CM | POA: Diagnosis not present

## 2015-03-22 DIAGNOSIS — D1801 Hemangioma of skin and subcutaneous tissue: Secondary | ICD-10-CM | POA: Diagnosis not present

## 2015-03-22 DIAGNOSIS — L218 Other seborrheic dermatitis: Secondary | ICD-10-CM | POA: Diagnosis not present

## 2015-03-22 DIAGNOSIS — Z85828 Personal history of other malignant neoplasm of skin: Secondary | ICD-10-CM | POA: Diagnosis not present

## 2015-03-22 DIAGNOSIS — D3617 Benign neoplasm of peripheral nerves and autonomic nervous system of trunk, unspecified: Secondary | ICD-10-CM | POA: Diagnosis not present

## 2015-03-23 DIAGNOSIS — Z23 Encounter for immunization: Secondary | ICD-10-CM | POA: Diagnosis not present

## 2015-03-24 DIAGNOSIS — R350 Frequency of micturition: Secondary | ICD-10-CM | POA: Diagnosis not present

## 2015-04-01 DIAGNOSIS — R0602 Shortness of breath: Secondary | ICD-10-CM | POA: Diagnosis not present

## 2015-04-01 DIAGNOSIS — E785 Hyperlipidemia, unspecified: Secondary | ICD-10-CM | POA: Diagnosis not present

## 2015-04-01 DIAGNOSIS — J449 Chronic obstructive pulmonary disease, unspecified: Secondary | ICD-10-CM | POA: Diagnosis not present

## 2015-04-01 DIAGNOSIS — I482 Chronic atrial fibrillation: Secondary | ICD-10-CM | POA: Diagnosis not present

## 2015-04-01 DIAGNOSIS — I2510 Atherosclerotic heart disease of native coronary artery without angina pectoris: Secondary | ICD-10-CM | POA: Diagnosis not present

## 2015-04-29 ENCOUNTER — Encounter: Payer: Self-pay | Admitting: Emergency Medicine

## 2015-04-29 ENCOUNTER — Other Ambulatory Visit: Payer: Self-pay | Admitting: Acute Care

## 2015-04-29 ENCOUNTER — Ambulatory Visit (INDEPENDENT_AMBULATORY_CARE_PROVIDER_SITE_OTHER): Payer: Medicare Other | Admitting: Emergency Medicine

## 2015-04-29 VITALS — BP 144/84 | HR 80 | Ht 70.5 in | Wt 188.0 lb

## 2015-04-29 DIAGNOSIS — R350 Frequency of micturition: Secondary | ICD-10-CM | POA: Diagnosis not present

## 2015-04-29 DIAGNOSIS — F17200 Nicotine dependence, unspecified, uncomplicated: Secondary | ICD-10-CM

## 2015-04-29 DIAGNOSIS — Z87891 Personal history of nicotine dependence: Secondary | ICD-10-CM

## 2015-04-29 DIAGNOSIS — J449 Chronic obstructive pulmonary disease, unspecified: Secondary | ICD-10-CM

## 2015-04-29 DIAGNOSIS — Z122 Encounter for screening for malignant neoplasm of respiratory organs: Secondary | ICD-10-CM

## 2015-04-29 DIAGNOSIS — R0600 Dyspnea, unspecified: Secondary | ICD-10-CM

## 2015-04-29 LAB — PULMONARY FUNCTION TEST
DL/VA % pred: 73 %
DL/VA: 3.43 ml/min/mmHg/L
DLCO UNC % PRED: 45 %
DLCO UNC: 15.67 ml/min/mmHg
FEF 25-75 PRE: 0.61 L/s
FEF 25-75 Post: 0.96 L/sec
FEF2575-%Change-Post: 58 %
FEF2575-%PRED-POST: 40 %
FEF2575-%PRED-PRE: 25 %
FEV1-%CHANGE-POST: 14 %
FEV1-%PRED-POST: 51 %
FEV1-%Pred-Pre: 44 %
FEV1-Post: 1.67 L
FEV1-Pre: 1.45 L
FEV1FVC-%CHANGE-POST: 4 %
FEV1FVC-%PRED-PRE: 78 %
FEV6-%Change-Post: 10 %
FEV6-%Pred-Post: 65 %
FEV6-%Pred-Pre: 59 %
FEV6-POST: 2.77 L
FEV6-Pre: 2.5 L
FEV6FVC-%CHANGE-POST: 0 %
FEV6FVC-%PRED-POST: 104 %
FEV6FVC-%Pred-Pre: 103 %
FVC-%Change-Post: 10 %
FVC-%PRED-PRE: 57 %
FVC-%Pred-Post: 63 %
FVC-Post: 2.82 L
FVC-Pre: 2.56 L
Post FEV1/FVC ratio: 59 %
Post FEV6/FVC ratio: 98 %
Pre FEV1/FVC ratio: 57 %
Pre FEV6/FVC Ratio: 97 %
RV % PRED: 127 %
RV: 3.37 L
TLC % pred: 90 %
TLC: 6.62 L

## 2015-04-29 NOTE — Progress Notes (Signed)
Subjective:    Patient ID: Todd Lloyd, male    DOB: 05/15/1941, 74 y.o.   MRN: [id number]  Shortness of Breath Pertinent negatives include no ear pain, fever, headaches, leg swelling, rash, rhinorrhea, sore throat, vomiting or wheezing.   74 year old man, former smoker (50+    pack years), with a history of CAD, hypertension, atrial fibrillation, GERD, CML post bone marrow transplant in 1990, and COPD that was diagnosed approximately 10 yrs ago and has been managed on Spiriva and Symbicort. He is referred today by Dr. Osborne Casco for progressive exertional dyspnea. 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 He can walk about 1 block before stopping to rest which is a change. No cough. He does hear wheeze, both w exertion and at rest. No CP. He just had a reassuring cards eval by Dr Wynonia Lawman 2 months, but is planning to see him again today.  CXR 10/27 w Dr Osborne Casco reported as clear. He is using SABA with some freq when doing yard work.        He was just treated with a prednisone taper beginning yesterday 03/18/15, first dose yesterday, hasn't noticed any changes.   ROV 04/29/15 -- follow up for dyspnea, tobacco use, COPD.  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 CBC and TSH last time were reassuring.  He has been on spiriva and symbicort.  He saw Dr Wynonia Lawman, got a reassuring report after a stress test. He still feels very limited, has dyspnea with working in the yard and splitting wood, cleaning house. He quit smoking 14 yrs ago.                                             Review of Systems  Constitutional: Negative for fever and unexpected weight change.  HENT: Negative for congestion, dental problem, ear pain, nosebleeds, postnasal drip, rhinorrhea, sinus pressure, sneezing, sore throat and trouble swallowing.     Eyes: Negative for redness and itching.  Respiratory: Positive for shortness of breath. Negative for cough, chest tightness and wheezing.   Cardiovascular: Negative for palpitations and leg swelling.  Gastrointestinal: Negative for nausea and vomiting.  Genitourinary: Negative for dysuria.  Musculoskeletal: Negative for joint swelling.  Skin: Negative for rash.  Neurological: Negative for headaches.  Hematological: Does not bruise/bleed easily.  Psychiatric/Behavioral: Negative for dysphoric mood. The patient is not nervous/anxious.    Past Medical History  Diagnosis Date  . Irregular heart rate   . Hypertension   . Acid reflux   . High cholesterol   . COPD (chronic obstructive pulmonary disease) ([place])   . Leukemia ([place])      No family history on file.   Social History   Social History  . Marital Status: Married    Spouse Name: N/A  . Number of Children: N/A  . Years of Education: N/A   Occupational History  . Not on file.   Social History Main Topics  . Smoking status: Former Smoker -- 2.00 packs/day    Types: Cigarettes    Quit date: 05/22/1998  . Smokeless tobacco: Not on file  . Alcohol Use: No  . Drug Use: No  .  Sexual Activity: Not on file   Other Topics Concern  . 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 albuterol (PROVENTIL HFA;VENTOLIN HFA) 108 (90 BASE) MCG/ACT inhaler Inhale 2 puffs into the lungs every 6 (six) hours as needed. For shortness of breath    . budesonide-formoterol (SYMBICORT) 160-4.5 MCG/ACT inhaler Inhale 2 puffs into the lungs 2 (two) times daily.    . digoxin (LANOXIN) 0.25 MG tablet Take 250 mcg by mouth daily.    Marland Kitchen donepezil (ARICEPT) 10 MG tablet Take 10 mg by mouth at bedtime.    . hyoscyamine (LEVSIN, ANASPAZ) 0.125 MG tablet Take  0.125 mg by mouth every 4 (four) hours as needed.    Marland Kitchen omeprazole (PRILOSEC) 20 MG capsule Take 20 mg by mouth daily.    . simvastatin (ZOCOR) 80 MG tablet Take 40 mg by mouth at bedtime.    Marland Kitchen tiotropium (SPIRIVA) 18 MCG inhalation capsule Place 18 mcg into inhaler and inhale daily.    . traZODone (DESYREL) 100 MG tablet Take 1 tablet (100 mg total) by mouth at bedtime. 30 tablet 6  . apixaban (ELIQUIS) 5 MG TABS tablet Take 5 mg by mouth 2 (two) times daily.     No facility-administered medications prior to visit.         Objective:   Physical Exam Filed Vitals:   04/29/15 1355 04/29/15 1357  BP:  144/84  Pulse:  80  Height: 5' 10.5" (1.791 m)   Weight: 188 lb (85.276 kg)   SpO2:  98%   Gen: Pleasant, well-nourished, in no distress,  normal affect  ENT: No lesions,  mouth clear,  oropharynx clear, no postnasal drip  Neck: No JVD, no TMG, no carotid bruits  Lungs: No use of accessory muscles, distant, clear without rales or rhonchi  Cardiovascular: RRR, heart sounds normal, no murmur or gallops, no peripheral edema  Musculoskeletal: No deformities, no cyanosis or clubbing  Neuro: alert, non focal  Skin: Warm, no lesions or rashes      Assessment & Plan:  COPD (chronic obstructive pulmonary disease) ([place]) He is compensated but is limited. Very concerned that he cannot do the same activities that he used to. I have explained that his disease is limiting him and that there is a ceiling on his functional capacity.   We will arrange for lung cancer screening with a CT scan chest  Please continue your Spiriva and Symbicort as you are taking them  Use albuterol 2 puffs as needed.  Follow with Dr Lamonte Sakai in 6 months or sooner if you have any problems   Tobacco use disorder Discussed cancer screening with him. He is interested, I will refer him today

## 2015-04-29 NOTE — Assessment & Plan Note (Addendum)
He is compensated but is limited. Very concerned that he cannot do the same activities that he used to. I have explained that his disease is limiting him and that there is a ceiling on his functional capacity.   We will arrange for lung cancer screening with a CT scan chest  Please continue your Spiriva and Symbicort as you are taking them  Use albuterol 2 puffs as needed.  Follow with Dr Lamonte Sakai in 6 months or sooner if you have any problems

## 2015-04-29 NOTE — Progress Notes (Signed)
PFT done today. 

## 2015-04-29 NOTE — Patient Instructions (Addendum)
We will arrange for lung cancer screening with a CT scan chest  Please continue your Spiriva and Symbicort as you are taking them  Use albuterol 2 puffs as needed.  Follow with Dr Lamonte Sakai in 6 months or sooner if you have any problems

## 2015-04-29 NOTE — Assessment & Plan Note (Signed)
Discussed cancer screening with him. He is interested, I will refer him today

## 2015-05-03 DIAGNOSIS — M47816 Spondylosis without myelopathy or radiculopathy, lumbar region: Secondary | ICD-10-CM | POA: Diagnosis not present

## 2015-05-03 DIAGNOSIS — M25552 Pain in left hip: Secondary | ICD-10-CM | POA: Diagnosis not present

## 2015-05-03 DIAGNOSIS — M5412 Radiculopathy, cervical region: Secondary | ICD-10-CM | POA: Diagnosis not present

## 2015-05-03 DIAGNOSIS — M25551 Pain in right hip: Secondary | ICD-10-CM | POA: Diagnosis not present

## 2015-05-06 ENCOUNTER — Ambulatory Visit (INDEPENDENT_AMBULATORY_CARE_PROVIDER_SITE_OTHER): Payer: Medicare Other | Admitting: Acute Care

## 2015-05-06 ENCOUNTER — Ambulatory Visit
Admission: RE | Admit: 2015-05-06 | Discharge: 2015-05-06 | Disposition: A | Discharge status: Home / Self Care | Payer: Medicare Other | Source: Ambulatory Visit | Attending: Acute Care | Admitting: Acute Care

## 2015-05-06 ENCOUNTER — Encounter: Payer: Self-pay | Admitting: Acute Care

## 2015-05-06 DIAGNOSIS — Z87891 Personal history of nicotine dependence: Secondary | ICD-10-CM

## 2015-05-06 NOTE — Progress Notes (Signed)
Shared Decision Making Visit Lung Cancer Screening Program [phone])   Eligibility:  Age 74 y.o.  Pack Years Smoking History Calculation 86 pack year smoker (# packs/per year x # years smoked)  Recent History of coughing up blood  no  Unexplained weight loss? no ( >Than 15 pounds within the last 6 months )  Prior History Lung / other cancer no (Diagnosis within the last 5 years already requiring surveillance chest CT Scans).  Smoking Status Former Smoker  Former Smokers: Years since quit: 14 years  Quit Date: 2002  Visit Components:  Discussion included one or more decision making aids. yes  Discussion included risk/benefits of screening. yes  Discussion included potential follow up diagnostic testing for abnormal scans. yes  Discussion included meaning and risk of over diagnosis. yes  Discussion included meaning and risk of False Positives. yes  Discussion included meaning of total radiation exposure. yes  Counseling Included:  Importance of adherence to annual lung cancer LDCT screening. yes  Impact of comorbidities on ability to participate in the program. yes  Ability and willingness to under diagnostic treatment. yes  Smoking Cessation Counseling:  Current Smokers:   Discussed importance of smoking cessation. Former Smoker  Information about tobacco cessation classes and interventions provided to patient. yes  Patient provided with "ticket" for LDCT Scan. yes  Symptomatic Patient. no  Counseling: NA  Diagnosis Code: Tobacco Use Z72.0  Asymptomatic Patient yes  Counseling NA, non-smoker  Former Smokers:   Discussed the importance of maintaining cigarette abstinence. yes  Diagnosis Code: Personal History of Nicotine Dependence. M19.622  Information about tobacco cessation classes and interventions provided to patient. Yes  Patient provided with "ticket" for LDCT Scan. yes  Written Order for Lung Cancer Screening with LDCT placed in Epic.  Yes (CT Chest Lung Cancer Screening Low Dose W/O CM) [id number] Z12.2-Screening of respiratory organs Z87.891-Personal history of nicotine dependence  I spent 15 minutes of face to face time with Todd Lloyd discussing the risks and benefits of lung cancer screening. We viewed a power point together that explained in detail the above noted topics. We took the time to pause the power point at intervals to allow for questions to be asked and answered to ensure understanding. We discussed that Todd Lloyd  had taken the single most powerful action possible to decrease his risk of developing lung cancer when he  quit smoking. I counseled him to remain smoke free, and to contact me if he ever had the desire to smoke again so that I can provide resources and tools to help support the effort to remain smoke free. We discussed the time and location of the scan, and that either [place] or I will call with the results within  24-48 hours of receiving them. He has my card and contact information in the event he needs to speak with me, in addition to a copy of the power point we reviewed as a resource. Todd Lloyd, and his wife verbalized understanding of all of the above and had no further questions upon leaving the office.   Magdalen Spatz, NP

## 2015-05-13 DIAGNOSIS — I10 Essential (primary) hypertension: Secondary | ICD-10-CM | POA: Diagnosis not present

## 2015-05-13 DIAGNOSIS — Z125 Encounter for screening for malignant neoplasm of prostate: Secondary | ICD-10-CM | POA: Diagnosis not present

## 2015-05-13 DIAGNOSIS — E784 Other hyperlipidemia: Secondary | ICD-10-CM | POA: Diagnosis not present

## 2015-05-13 DIAGNOSIS — E538 Deficiency of other specified B group vitamins: Secondary | ICD-10-CM | POA: Diagnosis not present

## 2015-05-20 DIAGNOSIS — R350 Frequency of micturition: Secondary | ICD-10-CM | POA: Diagnosis not present

## 2015-05-21 DIAGNOSIS — Z0000 Encounter for general adult medical examination without abnormal findings: Secondary | ICD-10-CM | POA: Diagnosis not present

## 2015-05-21 DIAGNOSIS — Z1389 Encounter for screening for other disorder: Secondary | ICD-10-CM | POA: Diagnosis not present

## 2015-05-21 DIAGNOSIS — I10 Essential (primary) hypertension: Secondary | ICD-10-CM | POA: Diagnosis not present

## 2015-05-21 DIAGNOSIS — B0229 Other postherpetic nervous system involvement: Secondary | ICD-10-CM | POA: Diagnosis not present

## 2015-05-21 DIAGNOSIS — K219 Gastro-esophageal reflux disease without esophagitis: Secondary | ICD-10-CM | POA: Diagnosis not present

## 2015-05-21 DIAGNOSIS — C92Z1 Other myeloid leukemia, in remission: Secondary | ICD-10-CM | POA: Diagnosis not present

## 2015-05-21 DIAGNOSIS — E538 Deficiency of other specified B group vitamins: Secondary | ICD-10-CM | POA: Diagnosis not present

## 2015-05-21 DIAGNOSIS — N401 Enlarged prostate with lower urinary tract symptoms: Secondary | ICD-10-CM | POA: Diagnosis not present

## 2015-05-21 DIAGNOSIS — Z6824 Body mass index (BMI) 24.0-24.9, adult: Secondary | ICD-10-CM | POA: Diagnosis not present

## 2015-05-21 DIAGNOSIS — N529 Male erectile dysfunction, unspecified: Secondary | ICD-10-CM | POA: Diagnosis not present

## 2015-05-21 DIAGNOSIS — J449 Chronic obstructive pulmonary disease, unspecified: Secondary | ICD-10-CM | POA: Diagnosis not present

## 2015-05-21 DIAGNOSIS — M549 Dorsalgia, unspecified: Secondary | ICD-10-CM | POA: Diagnosis not present

## 2015-05-27 DIAGNOSIS — M545 Low back pain: Secondary | ICD-10-CM | POA: Diagnosis not present

## 2015-05-27 DIAGNOSIS — M47816 Spondylosis without myelopathy or radiculopathy, lumbar region: Secondary | ICD-10-CM | POA: Diagnosis not present

## 2015-06-03 DIAGNOSIS — Z1212 Encounter for screening for malignant neoplasm of rectum: Secondary | ICD-10-CM | POA: Diagnosis not present

## 2015-06-10 DIAGNOSIS — M47816 Spondylosis without myelopathy or radiculopathy, lumbar region: Secondary | ICD-10-CM | POA: Diagnosis not present

## 2015-06-10 DIAGNOSIS — M545 Low back pain: Secondary | ICD-10-CM | POA: Diagnosis not present

## 2015-06-10 DIAGNOSIS — R350 Frequency of micturition: Secondary | ICD-10-CM | POA: Diagnosis not present

## 2015-06-16 DIAGNOSIS — M47816 Spondylosis without myelopathy or radiculopathy, lumbar region: Secondary | ICD-10-CM | POA: Diagnosis not present

## 2015-06-16 DIAGNOSIS — M545 Low back pain: Secondary | ICD-10-CM | POA: Diagnosis not present

## 2015-06-17 DIAGNOSIS — M545 Low back pain: Secondary | ICD-10-CM | POA: Diagnosis not present

## 2015-06-17 DIAGNOSIS — M47816 Spondylosis without myelopathy or radiculopathy, lumbar region: Secondary | ICD-10-CM | POA: Diagnosis not present

## 2015-06-23 DIAGNOSIS — M47816 Spondylosis without myelopathy or radiculopathy, lumbar region: Secondary | ICD-10-CM | POA: Diagnosis not present

## 2015-06-23 DIAGNOSIS — M545 Low back pain: Secondary | ICD-10-CM | POA: Diagnosis not present

## 2015-07-01 DIAGNOSIS — M47816 Spondylosis without myelopathy or radiculopathy, lumbar region: Secondary | ICD-10-CM | POA: Diagnosis not present

## 2015-07-06 DIAGNOSIS — I2510 Atherosclerotic heart disease of native coronary artery without angina pectoris: Secondary | ICD-10-CM | POA: Diagnosis not present

## 2015-07-06 DIAGNOSIS — J449 Chronic obstructive pulmonary disease, unspecified: Secondary | ICD-10-CM | POA: Diagnosis not present

## 2015-07-06 DIAGNOSIS — I482 Chronic atrial fibrillation: Secondary | ICD-10-CM | POA: Diagnosis not present

## 2015-07-06 DIAGNOSIS — E785 Hyperlipidemia, unspecified: Secondary | ICD-10-CM | POA: Diagnosis not present

## 2015-07-06 DIAGNOSIS — R0602 Shortness of breath: Secondary | ICD-10-CM | POA: Diagnosis not present

## 2015-07-08 DIAGNOSIS — R350 Frequency of micturition: Secondary | ICD-10-CM | POA: Diagnosis not present

## 2015-07-13 DIAGNOSIS — M47816 Spondylosis without myelopathy or radiculopathy, lumbar region: Secondary | ICD-10-CM | POA: Diagnosis not present

## 2015-07-27 ENCOUNTER — Ambulatory Visit (INDEPENDENT_AMBULATORY_CARE_PROVIDER_SITE_OTHER): Payer: Medicare Other | Admitting: Emergency Medicine

## 2015-07-27 ENCOUNTER — Encounter: Payer: Self-pay | Admitting: Emergency Medicine

## 2015-07-27 VITALS — BP 126/88 | HR 79 | Ht 71.0 in | Wt 181.0 lb

## 2015-07-27 DIAGNOSIS — J449 Chronic obstructive pulmonary disease, unspecified: Secondary | ICD-10-CM | POA: Diagnosis not present

## 2015-07-27 DIAGNOSIS — F17200 Nicotine dependence, unspecified, uncomplicated: Secondary | ICD-10-CM

## 2015-07-27 NOTE — Assessment & Plan Note (Signed)
RADS 2 LDCT in 12/16. Needs repeat in 12/17

## 2015-07-27 NOTE — Addendum Note (Signed)
Addended by: Desmond Dike C on: 07/27/2015 02:42 PM   Modules accepted: Medications

## 2015-07-27 NOTE — Patient Instructions (Addendum)
We will temporarily change your Spiriva and Symbicort to Anoro once a day. Take the samples and then call us to let us know if you have benefited. If you prefer the Anoro then we will work on changing permanently.  We will perform a walking oximetry today.  You would benefit from slowly increasing your exercise to build up your stamina.  Follow with Dr Lamonte Sakai in 6 months or sooner if you have any problems

## 2015-07-27 NOTE — Progress Notes (Signed)
Subjective:    Patient ID: Todd Lloyd, male    DOB: 18-Nov-1940, 75 y.o.   MRN: [id number]  Shortness of Breath Pertinent negatives include no ear pain, fever, headaches, leg swelling, rash, rhinorrhea, sore throat, vomiting or wheezing.   75 year old man, former smoker (50+    pack years), with a history of CAD, hypertension, atrial fibrillation, GERD, CML post bone marrow transplant in 1990, and COPD that was diagnosed approximately 10 yrs ago and has been managed on Spiriva and Symbicort. He is referred today by Dr. Osborne Casco for progressive exertional dyspnea. 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 He can walk about 1 block before stopping to rest which is a change. No cough. He does hear wheeze, both w exertion and at rest. No CP. He just had a reassuring cards eval by Dr Wynonia Lawman 2 months, but is planning to see him again today.  CXR 10/27 w Dr Osborne Casco reported as clear. He is using SABA with some freq when doing yard work.        He was just treated with a prednisone taper beginning yesterday 03/18/15, first dose yesterday, hasn't noticed any changes.   ROV 04/29/15 -- follow up for dyspnea, tobacco use, COPD.  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 CBC and TSH last time were reassuring.  He has been on spiriva and symbicort.  He saw Dr Wynonia Lawman, got a reassuring report after a stress test. He still feels very limited, has dyspnea with working in the yard and splitting wood, cleaning house. He quit smoking 14 yrs ago.   ROV 07/28/15 -- follow-up visit for dyspnea in the setting of severe obstructive lung disease and COPD. He has a positive bronchodilator response on spirometry. He has been treated with Spiriva and Symbicort. He did not desaturate on ambulation 03/19/15 office visit. He returns today  describing continued exertional SOB, doing house chores, etc. He doesn't really see any improvement with SABA when he has these episodes.     Review of Systems  Constitutional: Negative for fever and unexpected weight change.  HENT: Negative for congestion, dental problem, ear pain, nosebleeds, postnasal drip, rhinorrhea, sinus pressure, sneezing, sore throat and trouble swallowing.   Eyes: Negative for redness and itching.  Respiratory: Positive for shortness of breath. Negative for cough, chest tightness and wheezing.   Cardiovascular: Negative for palpitations and leg swelling.  Gastrointestinal: Negative for nausea and vomiting.  Genitourinary: Negative for dysuria.  Musculoskeletal: Negative for joint swelling.  Skin: Negative for rash.  Neurological: Negative for headaches.  Hematological: Does not bruise/bleed easily.  Psychiatric/Behavioral: Negative for dysphoric mood. The patient is not nervous/anxious.    Past Medical History  Diagnosis Date  . Irregular heart rate   . Hypertension   . Acid reflux   . High cholesterol   . COPD (chronic obstructive pulmonary disease) ([place])   . Leukemia ([place])      No family history on file.   Social History   Social History  . Marital Status: Married    Spouse Name: N/A  . Number of Children: N/A  . Years of Education: N/A   Occupational History  . Not on file.   Social History Main Topics  . Smoking status: Former Smoker -- 2.00 packs/day for 43 years  Types: Cigarettes    Quit date: 05/22/2000  . Smokeless tobacco: Not on file     Comment: Counseled to remain smoke free  . Alcohol Use: No  . Drug Use: No  . Sexual Activity: Not on file   Other Topics Concern  . 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 albuterol  (PROVENTIL HFA;VENTOLIN HFA) 108 (90 BASE) MCG/ACT inhaler Inhale 2 puffs into the lungs every 6 (six) hours as needed. For shortness of breath    . budesonide-formoterol (SYMBICORT) 160-4.5 MCG/ACT inhaler Inhale 2 puffs into the lungs 2 (two) times daily.    . digoxin (LANOXIN) 0.25 MG tablet Take 250 mcg by mouth daily.    Marland Kitchen donepezil (ARICEPT) 10 MG tablet Take 10 mg by mouth at bedtime.    . hyoscyamine (LEVSIN, ANASPAZ) 0.125 MG tablet Take 0.125 mg by mouth every 4 (four) hours as needed.    Marland Kitchen omeprazole (PRILOSEC) 20 MG capsule Take 20 mg by mouth daily.    . rivaroxaban (XARELTO) 20 MG TABS tablet Take 20 mg by mouth daily with supper.    . simvastatin (ZOCOR) 80 MG tablet Take 40 mg by mouth at bedtime.    Marland Kitchen tiotropium (SPIRIVA) 18 MCG inhalation capsule Place 18 mcg into inhaler and inhale daily.    . traZODone (DESYREL) 100 MG tablet Take 1 tablet (100 mg total) by mouth at bedtime. 30 tablet 6   No facility-administered medications prior to visit.         Objective:   Physical Exam Filed Vitals:   07/27/15 1411  BP: 126/88  Pulse: 79  Height: '5\' 11"'$  (1.803 m)  Weight: 181 lb (82.101 kg)  SpO2: 95%   Gen: Pleasant, well-nourished, in no distress,  normal affect  ENT: No lesions,  mouth clear,  oropharynx clear, no postnasal drip  Neck: No JVD, no TMG, no carotid bruits  Lungs: No use of accessory muscles, distant, clear without rales or rhonchi  Cardiovascular: RRR, heart sounds normal, no murmur or gallops, no peripheral edema  Musculoskeletal: No deformities, no cyanosis or clubbing  Neuro: alert, non focal  Skin: Warm, no lesions or rashes      Assessment & Plan:  COPD (chronic obstructive pulmonary disease) ([facility]) We will temporarily change your Spiriva and Symbicort to Anoro once a day. Take the samples and then call us to let us know if you have benefited. If you prefer the Anoro then we will work on changing permanently.  We will perform a walking  oximetry today.  You would benefit from slowly increasing your exercise to build up your stamina.  Follow with Dr Lamonte Sakai in 6 months or sooner if you have any problems  Tobacco use disorder RADS 2 LDCT in 12/16. Needs repeat in 12/17

## 2015-07-27 NOTE — Assessment & Plan Note (Signed)
We will temporarily change your Spiriva and Symbicort to Anoro once a day. Take the samples and then call us to let us know if you have benefited. If you prefer the Anoro then we will work on changing permanently.  We will perform a walking oximetry today.  You would benefit from slowly increasing your exercise to build up your stamina.  Follow with Dr Lamonte Sakai in 6 months or sooner if you have any problems

## 2015-08-05 DIAGNOSIS — R350 Frequency of micturition: Secondary | ICD-10-CM | POA: Diagnosis not present

## 2015-08-11 ENCOUNTER — Telehealth: Payer: Self-pay | Admitting: Emergency Medicine

## 2015-08-11 ENCOUNTER — Other Ambulatory Visit: Payer: Self-pay | Admitting: Acute Care

## 2015-08-11 DIAGNOSIS — M542 Cervicalgia: Secondary | ICD-10-CM | POA: Diagnosis not present

## 2015-08-11 DIAGNOSIS — M25512 Pain in left shoulder: Secondary | ICD-10-CM | POA: Diagnosis not present

## 2015-08-11 DIAGNOSIS — Z87891 Personal history of nicotine dependence: Secondary | ICD-10-CM

## 2015-08-11 NOTE — Telephone Encounter (Signed)
Spoke with the pt  He states that he prefers the [employer] over Symbicort and [facility] He needs 90 day written rx and also letter stating why we needs to take this vs spiriva and symbicort  He will pick up when ready and take to the [place]  I have left him 2 wks supply Anoro so he does not run out  Pt aware RB out of the office this wk  Please advise, thanks!

## 2015-08-16 NOTE — Telephone Encounter (Signed)
RB back in office today. RB - please advise.

## 2015-08-17 MED ORDER — UMECLIDINIUM-VILANTEROL 62.5-25 MCG/INH IN AEPB: 1.0000 | INHALATION_SPRAY | Freq: Every day | RESPIRATORY_TRACT | Status: DC

## 2015-08-17 NOTE — Telephone Encounter (Signed)
Letter written, signed and left up front with prescription for patient to pick up. Called and advised patient. Nothing further needed.

## 2015-08-17 NOTE — Telephone Encounter (Signed)
OK with me to write letter that states that his symptoms are poorly controlled on Spiriva + Symbicort and that this regimen is a therapeutic failure. Ok to write for Anoro 3 month supply if approved.

## 2015-08-25 ENCOUNTER — Telehealth: Payer: Self-pay | Admitting: Emergency Medicine

## 2015-08-25 MED ORDER — UMECLIDINIUM-VILANTEROL 62.5-25 MCG/INH IN AEPB: 1.0000 | INHALATION_SPRAY | Freq: Every day | RESPIRATORY_TRACT | Status: DC

## 2015-08-25 NOTE — Telephone Encounter (Signed)
1 sample of Anoro given to patient. Nothing more needed at this time.

## 2015-08-26 DIAGNOSIS — M5010 Cervical disc disorder with radiculopathy, unspecified cervical region: Secondary | ICD-10-CM | POA: Diagnosis not present

## 2015-08-26 DIAGNOSIS — M5412 Radiculopathy, cervical region: Secondary | ICD-10-CM | POA: Diagnosis not present

## 2015-08-31 ENCOUNTER — Telehealth: Payer: Self-pay | Admitting: Emergency Medicine

## 2015-08-31 MED ORDER — UMECLIDINIUM-VILANTEROL 62.5-25 MCG/INH IN AEPB: 1.0000 | INHALATION_SPRAY | Freq: Every day | RESPIRATORY_TRACT | Status: AC

## 2015-08-31 NOTE — Telephone Encounter (Signed)
Called and spoke with pt. Informed him that the samples have been placed at the front of the office and are ready for pick up. He voiced understanding and had no further questions. Samples have been placed at the front of the office. Nothing further needed.

## 2015-09-02 DIAGNOSIS — R350 Frequency of micturition: Secondary | ICD-10-CM | POA: Diagnosis not present

## 2015-09-23 DIAGNOSIS — L720 Epidermal cyst: Secondary | ICD-10-CM | POA: Diagnosis not present

## 2015-09-23 DIAGNOSIS — D1801 Hemangioma of skin and subcutaneous tissue: Secondary | ICD-10-CM | POA: Diagnosis not present

## 2015-09-23 DIAGNOSIS — Z85828 Personal history of other malignant neoplasm of skin: Secondary | ICD-10-CM | POA: Diagnosis not present

## 2015-09-23 DIAGNOSIS — D3617 Benign neoplasm of peripheral nerves and autonomic nervous system of trunk, unspecified: Secondary | ICD-10-CM | POA: Diagnosis not present

## 2015-09-23 DIAGNOSIS — D485 Neoplasm of uncertain behavior of skin: Secondary | ICD-10-CM | POA: Diagnosis not present

## 2015-09-23 DIAGNOSIS — L570 Actinic keratosis: Secondary | ICD-10-CM | POA: Diagnosis not present

## 2015-09-23 DIAGNOSIS — D235 Other benign neoplasm of skin of trunk: Secondary | ICD-10-CM | POA: Diagnosis not present

## 2015-09-23 DIAGNOSIS — D216 Benign neoplasm of connective and other soft tissue of trunk, unspecified: Secondary | ICD-10-CM | POA: Diagnosis not present

## 2015-09-23 DIAGNOSIS — C44519 Basal cell carcinoma of skin of other part of trunk: Secondary | ICD-10-CM | POA: Diagnosis not present

## 2015-09-23 DIAGNOSIS — L821 Other seborrheic keratosis: Secondary | ICD-10-CM | POA: Diagnosis not present

## 2015-09-23 DIAGNOSIS — D225 Melanocytic nevi of trunk: Secondary | ICD-10-CM | POA: Diagnosis not present

## 2015-09-28 DIAGNOSIS — Z6825 Body mass index (BMI) 25.0-25.9, adult: Secondary | ICD-10-CM | POA: Diagnosis not present

## 2015-09-28 DIAGNOSIS — M791 Myalgia: Secondary | ICD-10-CM | POA: Diagnosis not present

## 2015-09-28 DIAGNOSIS — M549 Dorsalgia, unspecified: Secondary | ICD-10-CM | POA: Diagnosis not present

## 2015-09-28 DIAGNOSIS — M4692 Unspecified inflammatory spondylopathy, cervical region: Secondary | ICD-10-CM | POA: Diagnosis not present

## 2015-09-30 DIAGNOSIS — R350 Frequency of micturition: Secondary | ICD-10-CM | POA: Diagnosis not present

## 2015-11-03 DIAGNOSIS — H353122 Nonexudative age-related macular degeneration, left eye, intermediate dry stage: Secondary | ICD-10-CM | POA: Diagnosis not present

## 2015-11-03 DIAGNOSIS — H4912 Fourth [trochlear] nerve palsy, left eye: Secondary | ICD-10-CM | POA: Diagnosis not present

## 2015-11-03 DIAGNOSIS — H5213 Myopia, bilateral: Secondary | ICD-10-CM | POA: Diagnosis not present

## 2015-11-03 DIAGNOSIS — H01001 Unspecified blepharitis right upper eyelid: Secondary | ICD-10-CM | POA: Diagnosis not present

## 2015-11-04 DIAGNOSIS — R350 Frequency of micturition: Secondary | ICD-10-CM | POA: Diagnosis not present

## 2015-11-18 DIAGNOSIS — I10 Essential (primary) hypertension: Secondary | ICD-10-CM | POA: Diagnosis not present

## 2015-11-18 DIAGNOSIS — M549 Dorsalgia, unspecified: Secondary | ICD-10-CM | POA: Diagnosis not present

## 2015-11-18 DIAGNOSIS — C92Z1 Other myeloid leukemia, in remission: Secondary | ICD-10-CM | POA: Diagnosis not present

## 2015-11-18 DIAGNOSIS — I480 Paroxysmal atrial fibrillation: Secondary | ICD-10-CM | POA: Diagnosis not present

## 2015-11-18 DIAGNOSIS — E7800 Pure hypercholesterolemia, unspecified: Secondary | ICD-10-CM | POA: Diagnosis not present

## 2015-11-18 DIAGNOSIS — Z6825 Body mass index (BMI) 25.0-25.9, adult: Secondary | ICD-10-CM | POA: Diagnosis not present

## 2015-11-18 DIAGNOSIS — D692 Other nonthrombocytopenic purpura: Secondary | ICD-10-CM | POA: Diagnosis not present

## 2015-11-18 DIAGNOSIS — E538 Deficiency of other specified B group vitamins: Secondary | ICD-10-CM | POA: Diagnosis not present

## 2015-11-18 DIAGNOSIS — N401 Enlarged prostate with lower urinary tract symptoms: Secondary | ICD-10-CM | POA: Diagnosis not present

## 2015-11-18 DIAGNOSIS — J449 Chronic obstructive pulmonary disease, unspecified: Secondary | ICD-10-CM | POA: Diagnosis not present

## 2015-11-18 DIAGNOSIS — N529 Male erectile dysfunction, unspecified: Secondary | ICD-10-CM | POA: Diagnosis not present

## 2015-11-18 DIAGNOSIS — K219 Gastro-esophageal reflux disease without esophagitis: Secondary | ICD-10-CM | POA: Diagnosis not present

## 2015-12-02 DIAGNOSIS — R350 Frequency of micturition: Secondary | ICD-10-CM | POA: Diagnosis not present

## 2015-12-13 DIAGNOSIS — M545 Low back pain: Secondary | ICD-10-CM | POA: Diagnosis not present

## 2015-12-13 DIAGNOSIS — R030 Elevated blood-pressure reading, without diagnosis of hypertension: Secondary | ICD-10-CM | POA: Diagnosis not present

## 2015-12-13 DIAGNOSIS — M542 Cervicalgia: Secondary | ICD-10-CM | POA: Diagnosis not present

## 2015-12-13 DIAGNOSIS — M25561 Pain in right knee: Secondary | ICD-10-CM | POA: Diagnosis not present

## 2015-12-13 DIAGNOSIS — M25511 Pain in right shoulder: Secondary | ICD-10-CM | POA: Diagnosis not present

## 2015-12-28 DIAGNOSIS — R350 Frequency of micturition: Secondary | ICD-10-CM | POA: Diagnosis not present

## 2016-01-05 DIAGNOSIS — M545 Low back pain: Secondary | ICD-10-CM | POA: Diagnosis not present

## 2016-01-05 DIAGNOSIS — M25511 Pain in right shoulder: Secondary | ICD-10-CM | POA: Diagnosis not present

## 2016-01-05 DIAGNOSIS — M542 Cervicalgia: Secondary | ICD-10-CM | POA: Diagnosis not present

## 2016-01-05 DIAGNOSIS — M25561 Pain in right knee: Secondary | ICD-10-CM | POA: Diagnosis not present

## 2016-01-11 DIAGNOSIS — E785 Hyperlipidemia, unspecified: Secondary | ICD-10-CM | POA: Diagnosis not present

## 2016-01-11 DIAGNOSIS — Z7901 Long term (current) use of anticoagulants: Secondary | ICD-10-CM | POA: Diagnosis not present

## 2016-01-11 DIAGNOSIS — I482 Chronic atrial fibrillation: Secondary | ICD-10-CM | POA: Diagnosis not present

## 2016-01-11 DIAGNOSIS — I2510 Atherosclerotic heart disease of native coronary artery without angina pectoris: Secondary | ICD-10-CM | POA: Diagnosis not present

## 2016-01-11 DIAGNOSIS — R0602 Shortness of breath: Secondary | ICD-10-CM | POA: Diagnosis not present

## 2016-01-11 DIAGNOSIS — J449 Chronic obstructive pulmonary disease, unspecified: Secondary | ICD-10-CM | POA: Diagnosis not present

## 2016-01-12 ENCOUNTER — Encounter: Payer: Self-pay | Admitting: Emergency Medicine

## 2016-01-12 ENCOUNTER — Encounter: Payer: Self-pay | Admitting: Physician Assistant

## 2016-01-12 ENCOUNTER — Ambulatory Visit (INDEPENDENT_AMBULATORY_CARE_PROVIDER_SITE_OTHER): Payer: Medicare Other | Admitting: Emergency Medicine

## 2016-01-12 DIAGNOSIS — Z86010 Personal history of colonic polyps: Secondary | ICD-10-CM | POA: Diagnosis not present

## 2016-01-12 DIAGNOSIS — J449 Chronic obstructive pulmonary disease, unspecified: Secondary | ICD-10-CM | POA: Diagnosis not present

## 2016-01-12 NOTE — Patient Instructions (Addendum)
Please continue your Anoro as you are taking it Take albuterol 2 puffs up to every 4 hours if needed for shortness of breath.  Get the flu shot this Fall.  Have Dr Osborne Casco tell you when you had your pneumonia shots so we can get that information.  We will refer you to gastroenterology since you have a history of colon polyps.  Follow with Dr Lamonte Sakai in 6 months or sooner if you have any problems

## 2016-01-12 NOTE — Assessment & Plan Note (Signed)
We will continue Anoro Albuterol prn Flu shot  Will get info regarding his PNA immunizations.

## 2016-01-12 NOTE — Progress Notes (Signed)
Subjective:    Patient ID: Todd Lloyd, male    DOB: 1941-05-03, 75 y.o.   MRN: [id number]  Shortness of Breath  Pertinent negatives include no ear pain, fever, headaches, leg swelling, rash, rhinorrhea, sore throat, vomiting or wheezing.   75 year old man, former smoker (50+    pack years), with a history of CAD, hypertension, atrial fibrillation, GERD, CML post bone marrow transplant in 1990, and COPD that was diagnosed approximately 10 yrs ago and has been managed on Spiriva and Symbicort. He is referred today by Dr. Osborne Casco for progressive exertional dyspnea. 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 He can walk about 1 block before stopping to rest which is a change. No cough. He does hear wheeze, both w exertion and at rest. No CP. He just had a reassuring cards eval by Dr Wynonia Lawman 2 months, but is planning to see him again today.  CXR 10/27 w Dr Osborne Casco reported as clear. He is using SABA with some freq when doing yard work.        He was just treated with a prednisone taper beginning yesterday 03/18/15, first dose yesterday, hasn't noticed any changes.   ROV 04/29/15 -- follow up for dyspnea, tobacco use, COPD.  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 CBC and TSH last time were reassuring.  He has been on spiriva and symbicort.  He saw Dr Wynonia Lawman, got a reassuring report after a stress test. He still feels very limited, has dyspnea with working in the yard and splitting wood, cleaning house. He quit smoking 14 yrs ago.   ROV 07/28/15 -- follow-up visit for dyspnea in the setting of severe obstructive lung disease and COPD. He has a positive bronchodilator response on spirometry. He has been treated with Spiriva and Symbicort. He did not desaturate on ambulation 03/19/15 office visit. He returns today  describing continued exertional SOB, doing house chores, etc. He doesn't really see any improvement with SABA when he has these episodes.   ROV 8/231/7 -- follow up visit for COPD, severe obstruction. At his last visit we changed symbicort and spiriva to Anoro. His breathing is much better, his exercise tolerance is much improved. His albuterol use has gone down. No flares since last time. He has been out working in the yard.     Review of Systems  Constitutional: Negative for fever and unexpected weight change.  HENT: Negative for congestion, dental problem, ear pain, nosebleeds, postnasal drip, rhinorrhea, sinus pressure, sneezing, sore throat and trouble swallowing.   Eyes: Negative for redness and itching.  Respiratory: Positive for shortness of breath. Negative for cough, chest tightness and wheezing.   Cardiovascular: Negative for palpitations and leg swelling.  Gastrointestinal: Negative for nausea and vomiting.  Genitourinary: Negative for dysuria.  Musculoskeletal: Negative for joint swelling.  Skin: Negative for rash.  Neurological: Negative for headaches.  Hematological: Does not bruise/bleed easily.  Psychiatric/Behavioral: Negative for dysphoric mood. The patient is not nervous/anxious.    Past Medical History:  Diagnosis Date  . Acid reflux   . COPD (chronic obstructive pulmonary disease) ([place])   . High cholesterol   . Hypertension   . Irregular heart rate   . Leukemia ([place])      No family history on file.   Social History  Social History  . Marital status: Married    Spouse name: N/A  . Number of children: N/A  . Years of education: N/A   Occupational History  . Not on file.   Social History Main Topics  . Smoking status: Former Smoker    Packs/day: 2.00    Years: 43.00    Types: Cigarettes    Quit date: 05/22/2000  . Smokeless tobacco: Not on file     Comment: Counseled to remain smoke free  . Alcohol use No  . Drug use: No  . Sexual activity: Not on  file   Other Topics Concern  . Not on file   Social History Narrative  . 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 albuterol (PROVENTIL HFA;VENTOLIN HFA) 108 (90 BASE) MCG/ACT inhaler Inhale 2 puffs into the lungs every 6 (six) hours as needed. For shortness of breath    . digoxin (LANOXIN) 0.25 MG tablet Take 250 mcg by mouth daily.    Marland Kitchen donepezil (ARICEPT) 10 MG tablet Take 10 mg by mouth at bedtime.    . hyoscyamine (LEVSIN, ANASPAZ) 0.125 MG tablet Take 0.125 mg by mouth every 4 (four) hours as needed.    Marland Kitchen omeprazole (PRILOSEC) 20 MG capsule Take 20 mg by mouth daily.    . rivaroxaban (XARELTO) 20 MG TABS tablet Take 20 mg by mouth daily with supper.    . simvastatin (ZOCOR) 80 MG tablet Take 40 mg by mouth at bedtime.    . traZODone (DESYREL) 100 MG tablet Take 1 tablet (100 mg total) by mouth at bedtime. 30 tablet 6  . umeclidinium-vilanterol (ANORO ELLIPTA) 62.5-25 MCG/INH AEPB Inhale 1 puff into the lungs daily. 1 each 0  . budesonide-formoterol (SYMBICORT) 160-4.5 MCG/ACT inhaler Inhale 2 puffs into the lungs 2 (two) times daily.    Marland Kitchen tiotropium (SPIRIVA) 18 MCG inhalation capsule Place 18 mcg into inhaler and inhale daily.    Marland Kitchen umeclidinium-vilanterol (ANORO ELLIPTA) 62.5-25 MCG/INH AEPB Inhale 1 puff into the lungs daily.    Marland Kitchen umeclidinium-vilanterol (ANORO ELLIPTA) 62.5-25 MCG/INH AEPB Inhale 1 puff into the lungs daily. 3 each 3   No facility-administered medications prior to visit.          Objective:   Physical Exam Vitals:   01/12/16 1355  BP: 122/82  Pulse: 76  SpO2: 95%  Weight: 179 lb (81.2 kg)  Height: '5\' 11"'$  (1.803 m)   Gen: Pleasant, well-nourished, in no distress,  normal affect  ENT: No lesions,  mouth clear,  oropharynx clear, no postnasal drip  Neck: No JVD, no TMG, no  carotid bruits  Lungs: No use of accessory muscles, distant, clear without rales or rhonchi  Cardiovascular: RRR, heart sounds normal, no murmur or gallops, no peripheral edema  Musculoskeletal: No deformities, no cyanosis or clubbing  Neuro: alert, non focal  Skin: Warm, no lesions or rashes      Assessment & Plan:  COPD (chronic obstructive pulmonary disease) (HCC) We will continue Anoro Albuterol prn Flu shot  Will get info regarding his PNA immunizations.   History of colonic polyps Has a hx of polyps, had a GIB after resection of polyps once in the past. He would like to follow with [facility], I will make referral.   [name], MD, PhD 01/12/2016, 2:21  PM [facility] [phone] or if no answer [phone]

## 2016-01-12 NOTE — Assessment & Plan Note (Signed)
Has a hx of polyps, had a GIB after resection of polyps once in the past. He would like to follow with [facility], I will make referral.

## 2016-01-26 ENCOUNTER — Telehealth: Payer: Self-pay | Admitting: *Deleted

## 2016-01-26 ENCOUNTER — Encounter (INDEPENDENT_AMBULATORY_CARE_PROVIDER_SITE_OTHER): Payer: Self-pay

## 2016-01-26 ENCOUNTER — Encounter: Payer: Self-pay | Admitting: Physician Assistant

## 2016-01-26 ENCOUNTER — Ambulatory Visit (INDEPENDENT_AMBULATORY_CARE_PROVIDER_SITE_OTHER): Payer: Medicare Other | Admitting: Physician Assistant

## 2016-01-26 VITALS — BP 130/80 | HR 88 | Ht 71.0 in | Wt 176.0 lb

## 2016-01-26 DIAGNOSIS — Z86010 Personal history of colonic polyps: Secondary | ICD-10-CM | POA: Diagnosis not present

## 2016-01-26 DIAGNOSIS — Z7901 Long term (current) use of anticoagulants: Secondary | ICD-10-CM

## 2016-01-26 DIAGNOSIS — Z1211 Encounter for screening for malignant neoplasm of colon: Secondary | ICD-10-CM | POA: Diagnosis not present

## 2016-01-26 MED ORDER — NA SULFATE-K SULFATE-MG SULF 17.5-3.13-1.6 GM/177ML PO SOLN: 1.0000 | Freq: Once | ORAL | 0 refills | Status: AC

## 2016-01-26 NOTE — Telephone Encounter (Signed)
  01/26/2016   RE: Todd Lloyd DOB: 11-14-1940 MRN: [id number]   Dear Dr. Tollie Eth,    We have scheduled the above patient for an endoscopic procedure. Our records show that he is on anticoagulation therapy.   Please advise as to how long the patient may come off his therapy of Xarelto prior to the procedure, which is scheduled for 02-15-2016. We may opt to  hold the Xarelto one week after the procedure if he requires polypectomy, because of previous history of major post polypectomy bleed.   Please  or route the completed form to [place] .   Sincerely,    Amy Esterwood PA-C

## 2016-01-26 NOTE — Progress Notes (Signed)
I agree with the above note, plans
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
## 2016-01-26 NOTE — Patient Instructions (Addendum)
You have been scheduled for a colonoscopy. Please follow written instructions given to you at your visit today.  Please pick up your prep supplies at the pharmacy : [facility], [place]  If you use inhalers (even only as needed), please bring them with you on the day of your procedure. Your physician has requested that you go to www.startemmi.com and enter the access code given to you at your visit today. This web site gives a general overview about your procedure. However, you should still follow specific instructions given to you by our office regarding your preparation for the procedure.

## 2016-02-04 ENCOUNTER — Telehealth: Payer: Self-pay | Admitting: Gastroenterology

## 2016-02-04 NOTE — Telephone Encounter (Signed)
Colonoscopy Dr. Earlean Shawl November 2004 done for "undergoing initial colonoscopy for neoplasia surveillance and for diagnostic evaluation". Multiple colon polyps were removed by snare and hot biopsy resection, internal hemorrhoids were noted. The looks like 8-10 polyps were removed on my review. 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 He had post polypectomy bleeding for which Dr. Earlean Shawl repeated colonoscopy 1-2 days later. If out ulcer at the sigmoid colon and an Endo Clip was placed. He also removed 2 more polyps. Colonoscopy Dr. Earlean Shawl 2008 done for "3 year interval for adenomatous polyps". This was a normal colonoscopy and Dr. Earlean Shawl recommended repeat colonoscopy at five-year interval

## 2016-02-08 NOTE — Telephone Encounter (Signed)
Pt wants to know when he needs to stop his bloodthinner

## 2016-02-08 NOTE — Telephone Encounter (Signed)
Called the patient to advise I have not heard from Dr. Thurman Coyer office yet. I routed him a letter on 9-6.  Advised patient that I will call them today to get an answer on when he can stop his Xarelto.  I did call that office and faxed them the letter today for an urgent answer.

## 2016-02-09 NOTE — Telephone Encounter (Signed)
Called patient to advise he can hold the Xarelto on  The 9-24th, 25th, and 26th.  He can resume it on 9-27. The patient verbalized understanding of the instructions.

## 2016-02-15 ENCOUNTER — Encounter: Payer: Self-pay | Admitting: Gastroenterology

## 2016-02-15 ENCOUNTER — Ambulatory Visit (AMBULATORY_SURGERY_CENTER): Payer: Medicare Other | Admitting: Gastroenterology

## 2016-02-15 VITALS — BP 128/91 | HR 70 | Temp 96.9°F | Resp 29 | Ht 71.0 in | Wt 176.0 lb

## 2016-02-15 DIAGNOSIS — D123 Benign neoplasm of transverse colon: Secondary | ICD-10-CM

## 2016-02-15 DIAGNOSIS — D125 Benign neoplasm of sigmoid colon: Secondary | ICD-10-CM

## 2016-02-15 DIAGNOSIS — Z1211 Encounter for screening for malignant neoplasm of colon: Secondary | ICD-10-CM | POA: Diagnosis not present

## 2016-02-15 DIAGNOSIS — Z86010 Personal history of colonic polyps: Secondary | ICD-10-CM | POA: Diagnosis not present

## 2016-02-15 DIAGNOSIS — D122 Benign neoplasm of ascending colon: Secondary | ICD-10-CM | POA: Diagnosis not present

## 2016-02-15 DIAGNOSIS — D124 Benign neoplasm of descending colon: Secondary | ICD-10-CM | POA: Diagnosis not present

## 2016-02-15 DIAGNOSIS — I4891 Unspecified atrial fibrillation: Secondary | ICD-10-CM | POA: Diagnosis not present

## 2016-02-15 DIAGNOSIS — J449 Chronic obstructive pulmonary disease, unspecified: Secondary | ICD-10-CM | POA: Diagnosis not present

## 2016-02-15 MED ORDER — SODIUM CHLORIDE 0.9 % IV SOLN: 500.0000 mL | INTRAVENOUS | Status: DC

## 2016-02-15 NOTE — Patient Instructions (Signed)
YOU HAD AN ENDOSCOPIC PROCEDURE TODAY AT [facility]:   Refer to the procedure report that was given to you for any specific questions about what was found during the examination.  If the procedure report does not answer your questions, please call your gastroenterologist to clarify.  If you requested that your care partner not be given the details of your procedure findings, then the procedure report has been included in a sealed envelope for you to review at your convenience later.  YOU SHOULD EXPECT: Some feelings of bloating in the abdomen. Passage of more gas than usual.  Walking can help get rid of the air that was put into your GI tract during the procedure and reduce the bloating. If you had a lower endoscopy (such as a colonoscopy or flexible sigmoidoscopy) you may notice spotting of blood in your stool or on the toilet paper. If you underwent a bowel prep for your procedure, you may not have a normal bowel movement for a few days.  Please Note:  You might notice some irritation and congestion in your nose or some drainage.  This is from the oxygen used during your procedure.  There is no need for concern and it should clear up in a day or so.  SYMPTOMS TO REPORT IMMEDIATELY:   Following lower endoscopy (colonoscopy or flexible sigmoidoscopy):  Excessive amounts of blood in the stool  Significant tenderness or worsening of abdominal pains  Swelling of the abdomen that is new, acute  Fever of 100F or higher  For urgent or emergent issues, a gastroenterologist can be reached at any hour by calling [phone].   DIET:  We do recommend a small meal at first, but then you may proceed to your regular diet.  Drink plenty of fluids but you should avoid alcoholic beverages for 24 hours.  ACTIVITY:  You should plan to take it easy for the rest of today and you should NOT DRIVE or use heavy machinery until tomorrow (because of the sedation medicines used during the test).     FOLLOW UP: Our staff will call the number listed on your records the next business day following your procedure to check on you and address any questions or concerns that you may have regarding the information given to you following your procedure. If we do not reach you, we will leave a message.  However, if you are feeling well and you are not experiencing any problems, there is no need to return our call.  We will assume that you have returned to your regular daily activities without incident.  If any biopsies were taken you will be contacted by phone or by letter within the next 1-3 weeks.  Please call us at [phone] if you have not heard about the biopsies in 3 weeks.    SIGNATURES/CONFIDENTIALITY: You and/or your care partner have signed paperwork which will be entered into your electronic medical record.  These signatures attest to the fact that that the information above on your After Visit Summary has been reviewed and is understood.  Full responsibility of the confidentiality of this discharge information lies with you and/or your care-partner.  RESTART [place] to resume all over medications today  Please read over handout about polyps  Await pathology

## 2016-02-15 NOTE — Progress Notes (Signed)
To recovery, report to [place], RN, VSS 

## 2016-02-15 NOTE — Op Note (Signed)
[facility] Patient Name: Todd Lloyd Procedure Date: 02/15/2016 7:33 AM MRN: [id number] Endoscopist: Milus Banister , MD Age: 75 Referring MD:  Date of Birth: 1940-07-07 Gender: Male Account #: [id number] Procedure:                Colonoscopy Indications:              High risk colon cancer surveillance: Personal                            history of colonic polyps (colonoscopy Dr. Earlean Shawl                            2004, 8-10 TAs, SSA; repeat colonoscopy Dr. Earlean Shawl                            2008, no polyps) Medicines:                Monitored Anesthesia Care Procedure:                Pre-Anesthesia Assessment:                           - Prior to the procedure, a History and Physical                            was performed, and patient medications and                            allergies were reviewed. The patient's tolerance of                            previous anesthesia was also reviewed. The risks                            and benefits of the procedure and the sedation                            options and risks were discussed with the patient.                            All questions were answered, and informed consent                            was obtained. Prior Anticoagulants: The patient has                            taken Xarelto (rivaroxaban), last dose was 3 days                            prior to procedure. ASA Grade Assessment: II - A                            patient with mild systemic disease. After reviewing  the risks and benefits, the patient was deemed in                            satisfactory condition to undergo the procedure.                           After obtaining informed consent, the colonoscope                            was passed under direct vision. Throughout the                            procedure, the patient's blood pressure, pulse, and                            oxygen saturations were  monitored continuously. The                            Model CF-HQ190L [id number]) scope was introduced                            through the anus and advanced to the the cecum,                            identified by appendiceal orifice and ileocecal                            valve. The colonoscopy was performed without                            difficulty. The patient tolerated the procedure                            well. The quality of the bowel preparation was                            excellent. The ileocecal valve, appendiceal                            orifice, and rectum were photographed. Scope In: 7:49:15 AM Scope Out: 8:04:16 AM Scope Withdrawal Time: 0 hours 13 minutes 6 seconds  Total Procedure Duration: 0 hours 15 minutes 1 second  Findings:                 Eleven sessile polyps were found in the sigmoid                            colon, descending colon, transverse colon and                            ascending colon. The polyps were 3 to 10 mm in                            size. These polyps were removed with a  cold snare.                            Resection and retrieval were complete.                           The exam was otherwise without abnormality on                            direct and retroflexion views. Complications:            No immediate complications. Estimated blood loss:                            None. Estimated Blood Loss:     Estimated blood loss: none. Impression:               - Eleven 3 to 10 mm polyps in the sigmoid colon, in                            the descending colon, in the transverse colon and                            in the ascending colon, removed with a cold snare.                            Resected and retrieved.                           - The examination was otherwise normal on direct                            and retroflexion views. Recommendation:           - Patient has a contact number available for                             emergencies. The signs and symptoms of potential                            delayed complications were discussed with the                            patient. Return to normal activities tomorrow.                            Written discharge instructions were provided to the                            patient.                           - Resume previous diet.                           - Continue present medications. Restart your  xarelto tomorrow.                           You will receive a letter within 2-3 weeks with the                            pathology results and my final recommendations.                           If the polyp(s) is proven to be 'pre-cancerous' on                            pathology, you will need repeat colonoscopy in 1                            year given the large number of polyps removed today                            and also previously. Milus Banister, MD 02/15/2016 8:11:46 AM This report has been signed electronically.

## 2016-02-16 ENCOUNTER — Telehealth: Payer: Self-pay | Admitting: *Deleted

## 2016-02-16 NOTE — Telephone Encounter (Signed)
  Follow up Call-  Call back number 02/15/2016  Post procedure Call Back phone  # [phone]  Permission to leave phone message Yes  Some recent data might be hidden     Patient questions:  Do you have a fever, pain , or abdominal swelling? No. Pain Score  0 *  Have you tolerated food without any problems? Yes.    Have you been able to return to your normal activities? Yes.    Do you have any questions about your discharge instructions: Diet   No. Medications  No. Follow up visit  No.  Do you have questions or concerns about your Care? No.  Actions: * If pain score is 4 or above: No action needed, pain <4.

## 2016-02-18 DIAGNOSIS — R350 Frequency of micturition: Secondary | ICD-10-CM | POA: Diagnosis not present

## 2016-02-21 ENCOUNTER — Encounter: Payer: Self-pay | Admitting: Gastroenterology

## 2016-03-25 DIAGNOSIS — Z23 Encounter for immunization: Secondary | ICD-10-CM | POA: Diagnosis not present

## 2016-03-31 DIAGNOSIS — F332 Major depressive disorder, recurrent severe without psychotic features: Secondary | ICD-10-CM | POA: Diagnosis not present

## 2016-03-31 DIAGNOSIS — M549 Dorsalgia, unspecified: Secondary | ICD-10-CM | POA: Diagnosis not present

## 2016-03-31 DIAGNOSIS — Z1389 Encounter for screening for other disorder: Secondary | ICD-10-CM | POA: Diagnosis not present

## 2016-03-31 DIAGNOSIS — R634 Abnormal weight loss: Secondary | ICD-10-CM | POA: Diagnosis not present

## 2016-03-31 DIAGNOSIS — Z6821 Body mass index (BMI) 21.0-21.9, adult: Secondary | ICD-10-CM | POA: Diagnosis not present

## 2016-04-19 DIAGNOSIS — M4692 Unspecified inflammatory spondylopathy, cervical region: Secondary | ICD-10-CM | POA: Diagnosis not present

## 2016-04-19 DIAGNOSIS — R634 Abnormal weight loss: Secondary | ICD-10-CM | POA: Diagnosis not present

## 2016-04-19 DIAGNOSIS — F332 Major depressive disorder, recurrent severe without psychotic features: Secondary | ICD-10-CM | POA: Diagnosis not present

## 2016-04-19 DIAGNOSIS — Z6823 Body mass index (BMI) 23.0-23.9, adult: Secondary | ICD-10-CM | POA: Diagnosis not present

## 2016-04-19 DIAGNOSIS — J449 Chronic obstructive pulmonary disease, unspecified: Secondary | ICD-10-CM | POA: Diagnosis not present

## 2016-05-09 ENCOUNTER — Inpatient Hospital Stay: Admission: RE | Admit: 2016-05-09 | Payer: Medicare Other | Source: Ambulatory Visit

## 2016-05-09 ENCOUNTER — Other Ambulatory Visit: Payer: Self-pay | Admitting: Acute Care

## 2016-05-09 ENCOUNTER — Ambulatory Visit
Admission: RE | Admit: 2016-05-09 | Discharge: 2016-05-09 | Disposition: A | Discharge status: Home / Self Care | Payer: Medicare Other | Source: Ambulatory Visit | Attending: Acute Care | Admitting: Acute Care

## 2016-05-09 DIAGNOSIS — Z87891 Personal history of nicotine dependence: Secondary | ICD-10-CM | POA: Diagnosis not present

## 2016-05-12 ENCOUNTER — Telehealth: Payer: Self-pay | Admitting: Acute Care

## 2016-05-12 NOTE — Telephone Encounter (Signed)
I have called [name] with the results of his low-dose screening CT. I explained his scan was read as a lung RADS 2 indicating nodules that are benign in appearance or behavior. I explained to the patient that since he has quit smoking for 15 years he no longer qualifies per CMS guidelines for future scanning through the lung cancer screening program. We did discuss his options to continue being scanned by his primary care physician. Additional findings included aortic atherosclerosis and coronary artery disease. The patient is currently taking simvastatin per his primary care physician, and states that he has his cholesterol and triglycerides checked on a regular basis by his PCP. He verbalized understanding that this is the last year that he will receive his scan to the lung cancer screening program, and states that he will follow-up with his primary care physician Dr. Domenick Gong at his 05/25/2016 appointment.

## 2016-05-18 DIAGNOSIS — E7800 Pure hypercholesterolemia, unspecified: Secondary | ICD-10-CM | POA: Diagnosis not present

## 2016-05-18 DIAGNOSIS — E538 Deficiency of other specified B group vitamins: Secondary | ICD-10-CM | POA: Diagnosis not present

## 2016-05-18 DIAGNOSIS — Z125 Encounter for screening for malignant neoplasm of prostate: Secondary | ICD-10-CM | POA: Diagnosis not present

## 2016-05-18 DIAGNOSIS — I10 Essential (primary) hypertension: Secondary | ICD-10-CM | POA: Diagnosis not present

## 2016-05-25 DIAGNOSIS — Z6824 Body mass index (BMI) 24.0-24.9, adult: Secondary | ICD-10-CM | POA: Diagnosis not present

## 2016-05-25 DIAGNOSIS — R634 Abnormal weight loss: Secondary | ICD-10-CM | POA: Diagnosis not present

## 2016-05-25 DIAGNOSIS — F332 Major depressive disorder, recurrent severe without psychotic features: Secondary | ICD-10-CM | POA: Diagnosis not present

## 2016-05-25 DIAGNOSIS — C92Z1 Other myeloid leukemia, in remission: Secondary | ICD-10-CM | POA: Diagnosis not present

## 2016-05-25 DIAGNOSIS — I480 Paroxysmal atrial fibrillation: Secondary | ICD-10-CM | POA: Diagnosis not present

## 2016-05-25 DIAGNOSIS — E538 Deficiency of other specified B group vitamins: Secondary | ICD-10-CM | POA: Diagnosis not present

## 2016-05-25 DIAGNOSIS — I10 Essential (primary) hypertension: Secondary | ICD-10-CM | POA: Diagnosis not present

## 2016-05-25 DIAGNOSIS — M4692 Unspecified inflammatory spondylopathy, cervical region: Secondary | ICD-10-CM | POA: Diagnosis not present

## 2016-05-25 DIAGNOSIS — N529 Male erectile dysfunction, unspecified: Secondary | ICD-10-CM | POA: Diagnosis not present

## 2016-05-25 DIAGNOSIS — N401 Enlarged prostate with lower urinary tract symptoms: Secondary | ICD-10-CM | POA: Diagnosis not present

## 2016-05-25 DIAGNOSIS — Z0000 Encounter for general adult medical examination without abnormal findings: Secondary | ICD-10-CM | POA: Diagnosis not present

## 2016-05-25 DIAGNOSIS — E7800 Pure hypercholesterolemia, unspecified: Secondary | ICD-10-CM | POA: Diagnosis not present

## 2016-07-13 DIAGNOSIS — E785 Hyperlipidemia, unspecified: Secondary | ICD-10-CM | POA: Diagnosis not present

## 2016-07-13 DIAGNOSIS — I482 Chronic atrial fibrillation: Secondary | ICD-10-CM | POA: Diagnosis not present

## 2016-07-13 DIAGNOSIS — I2510 Atherosclerotic heart disease of native coronary artery without angina pectoris: Secondary | ICD-10-CM | POA: Diagnosis not present

## 2016-07-13 DIAGNOSIS — J449 Chronic obstructive pulmonary disease, unspecified: Secondary | ICD-10-CM | POA: Diagnosis not present

## 2016-07-13 DIAGNOSIS — R0602 Shortness of breath: Secondary | ICD-10-CM | POA: Diagnosis not present

## 2016-07-13 DIAGNOSIS — Z7901 Long term (current) use of anticoagulants: Secondary | ICD-10-CM | POA: Diagnosis not present

## 2016-07-19 DIAGNOSIS — R030 Elevated blood-pressure reading, without diagnosis of hypertension: Secondary | ICD-10-CM | POA: Diagnosis not present

## 2016-07-19 DIAGNOSIS — M545 Low back pain: Secondary | ICD-10-CM | POA: Diagnosis not present

## 2016-07-19 DIAGNOSIS — M25511 Pain in right shoulder: Secondary | ICD-10-CM | POA: Diagnosis not present

## 2016-07-19 DIAGNOSIS — M542 Cervicalgia: Secondary | ICD-10-CM | POA: Diagnosis not present

## 2016-08-17 DIAGNOSIS — M545 Low back pain: Secondary | ICD-10-CM | POA: Diagnosis not present

## 2016-08-17 DIAGNOSIS — M542 Cervicalgia: Secondary | ICD-10-CM | POA: Diagnosis not present

## 2016-08-17 DIAGNOSIS — M25561 Pain in right knee: Secondary | ICD-10-CM | POA: Diagnosis not present

## 2016-08-17 DIAGNOSIS — R269 Unspecified abnormalities of gait and mobility: Secondary | ICD-10-CM | POA: Diagnosis not present

## 2016-08-17 DIAGNOSIS — R030 Elevated blood-pressure reading, without diagnosis of hypertension: Secondary | ICD-10-CM | POA: Diagnosis not present

## 2016-08-22 DIAGNOSIS — M170 Bilateral primary osteoarthritis of knee: Secondary | ICD-10-CM | POA: Diagnosis not present

## 2016-09-06 DIAGNOSIS — J449 Chronic obstructive pulmonary disease, unspecified: Secondary | ICD-10-CM | POA: Diagnosis not present

## 2016-09-06 DIAGNOSIS — Z6823 Body mass index (BMI) 23.0-23.9, adult: Secondary | ICD-10-CM | POA: Diagnosis not present

## 2016-09-06 DIAGNOSIS — N3281 Overactive bladder: Secondary | ICD-10-CM | POA: Diagnosis not present

## 2016-09-06 DIAGNOSIS — D692 Other nonthrombocytopenic purpura: Secondary | ICD-10-CM | POA: Diagnosis not present

## 2016-09-06 DIAGNOSIS — I480 Paroxysmal atrial fibrillation: Secondary | ICD-10-CM | POA: Diagnosis not present

## 2016-09-06 DIAGNOSIS — I10 Essential (primary) hypertension: Secondary | ICD-10-CM | POA: Diagnosis not present

## 2016-09-06 DIAGNOSIS — C92Z1 Other myeloid leukemia, in remission: Secondary | ICD-10-CM | POA: Diagnosis not present

## 2016-09-06 DIAGNOSIS — Z7901 Long term (current) use of anticoagulants: Secondary | ICD-10-CM | POA: Diagnosis not present

## 2016-09-06 DIAGNOSIS — K219 Gastro-esophageal reflux disease without esophagitis: Secondary | ICD-10-CM | POA: Diagnosis not present

## 2016-09-06 DIAGNOSIS — E7800 Pure hypercholesterolemia, unspecified: Secondary | ICD-10-CM | POA: Diagnosis not present

## 2016-09-06 DIAGNOSIS — E538 Deficiency of other specified B group vitamins: Secondary | ICD-10-CM | POA: Diagnosis not present

## 2016-09-12 ENCOUNTER — Ambulatory Visit (INDEPENDENT_AMBULATORY_CARE_PROVIDER_SITE_OTHER): Payer: Medicare Other | Admitting: Neurology

## 2016-09-12 ENCOUNTER — Encounter: Payer: Self-pay | Admitting: Neurology

## 2016-09-12 VITALS — BP 121/62 | HR 74 | Ht 71.0 in | Wt 177.5 lb

## 2016-09-12 DIAGNOSIS — R269 Unspecified abnormalities of gait and mobility: Secondary | ICD-10-CM

## 2016-09-12 DIAGNOSIS — E538 Deficiency of other specified B group vitamins: Secondary | ICD-10-CM | POA: Diagnosis not present

## 2016-09-12 DIAGNOSIS — R413 Other amnesia: Secondary | ICD-10-CM | POA: Diagnosis not present

## 2016-09-12 HISTORY — DX: Other amnesia: R41.3

## 2016-09-12 HISTORY — DX: Unspecified abnormalities of gait and mobility: R26.9
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
## 2016-09-12 NOTE — Patient Instructions (Signed)
   We will check blood work today and get MRI of the brain.

## 2016-09-14 ENCOUNTER — Telehealth: Payer: Self-pay | Admitting: *Deleted

## 2016-09-14 LAB — SEDIMENTATION RATE: Sed Rate: 2 mm/hr (ref 0–30)

## 2016-09-14 LAB — VITAMIN B12: Vitamin B-12: 354 pg/mL (ref 232–1245)

## 2016-09-14 LAB — COPPER, SERUM: COPPER: 89 ug/dL (ref 72–166)

## 2016-09-14 LAB — RPR: RPR Ser Ql: NONREACTIVE

## 2016-09-14 NOTE — Telephone Encounter (Signed)
Called and spoke with wife/patient about unremarkable labs per CW,MD note. They verbalized understanding.

## 2016-09-14 NOTE — Telephone Encounter (Signed)
-----   Message from Kathrynn Ducking, MD sent at 09/14/2016  7:14 AM EDT -----   The blood work results are unremarkable. Please call the patient.  ----- Message ----- From: Lavone Neri Lab Results In Sent: 09/13/2016   7:43 AM To: Kathrynn Ducking, MD

## 2016-09-26 ENCOUNTER — Ambulatory Visit
Admission: RE | Admit: 2016-09-26 | Discharge: 2016-09-26 | Disposition: A | Discharge status: Home / Self Care | Payer: Medicare Other | Source: Ambulatory Visit | Attending: Neurology | Admitting: Neurology

## 2016-09-26 DIAGNOSIS — R413 Other amnesia: Secondary | ICD-10-CM

## 2016-09-26 DIAGNOSIS — R269 Unspecified abnormalities of gait and mobility: Secondary | ICD-10-CM | POA: Diagnosis not present

## 2016-09-28 ENCOUNTER — Telehealth: Payer: Self-pay | Admitting: Neurology

## 2016-09-28 DIAGNOSIS — R269 Unspecified abnormalities of gait and mobility: Secondary | ICD-10-CM

## 2016-09-28 NOTE — Telephone Encounter (Signed)
  I called the patient. The patient has minimal white matter changes on the brain, no change from 2015. The patient does have some memory issues, we may consider medications for memory the future.  Patient does have a balance issue, this study is not explain that. The patient does report some numbness in the feet, restless leg symptoms with the right leg. We will do nerve conduction studies on both legs, EMG on the right leg.  MRI brain 09/28/16:  IMPRESSION:  Abnormal MRI brain (without) demonstrating: 1. Mild periventricular and subcortical foci of non-specific gliosis. 2. Mild perisylvian atrophy.  3. No acute findings. 4. No significant change from MRI on 11/12/13.

## 2016-10-18 ENCOUNTER — Encounter (INDEPENDENT_AMBULATORY_CARE_PROVIDER_SITE_OTHER): Payer: Self-pay

## 2016-10-18 ENCOUNTER — Ambulatory Visit (INDEPENDENT_AMBULATORY_CARE_PROVIDER_SITE_OTHER): Payer: Self-pay | Admitting: Neurology

## 2016-10-18 ENCOUNTER — Encounter: Payer: Self-pay | Admitting: Neurology

## 2016-10-18 ENCOUNTER — Ambulatory Visit (INDEPENDENT_AMBULATORY_CARE_PROVIDER_SITE_OTHER): Payer: Medicare Other | Admitting: Neurology

## 2016-10-18 DIAGNOSIS — G2581 Restless legs syndrome: Secondary | ICD-10-CM

## 2016-10-18 DIAGNOSIS — R269 Unspecified abnormalities of gait and mobility: Secondary | ICD-10-CM | POA: Diagnosis not present

## 2016-10-18 DIAGNOSIS — G609 Hereditary and idiopathic neuropathy, unspecified: Secondary | ICD-10-CM

## 2016-10-18 DIAGNOSIS — G629 Polyneuropathy, unspecified: Secondary | ICD-10-CM

## 2016-10-18 HISTORY — DX: Restless legs syndrome: G25.81

## 2016-10-18 HISTORY — DX: Polyneuropathy, unspecified: G62.9

## 2016-10-18 MED ORDER — PRAMIPEXOLE DIHYDROCHLORIDE 0.25 MG PO TABS: 0.2500 mg | ORAL_TABLET | Freq: Every day | ORAL | 3 refills | Status: DC

## 2016-10-18 NOTE — Procedures (Signed)
     HISTORY:  Todd Lloyd is a 76 year old gentleman with a history of numbness in the feet, right greater than left, and some gait instability. He also reports some back pain. The patient is being evaluated for possible neuropathy or a lumbosacral radiculopathy.  NERVE CONDUCTION STUDIES:  Nerve conduction studies were done on both lower extremities. The distal motor latencies for the peroneal and posterior tibial nerves were within normal limits bilaterally with low motor amplitudes seen for the right peroneal nerve, normal for the left peroneal nerve and for the posterior tibial nerves bilaterally. The nerve conduction velocities for the peroneal and posterior tibial nerves were slowed bilaterally. The sensory latencies for the sural nerves were slightly prolonged bilaterally and unobtainable for the peroneal nerves bilaterally. The F wave latencies for the posterior tibial nerves were prolonged bilaterally.  EMG STUDIES:  EMG study was performed on the right lower extremity:  The tibialis anterior muscle reveals 2 to 4K motor units with full recruitment. No fibrillations or positive waves were seen. The peroneus tertius muscle reveals 2 to 4K motor units with slightly decreased recruitment. No fibrillations or positive waves were seen. The medial gastrocnemius muscle reveals 1 to 3K motor units with full recruitment. No fibrillations or positive waves were seen. The vastus lateralis muscle reveals 2 to 4K motor units with full recruitment. No fibrillations or positive waves were seen. The iliopsoas muscle reveals 2 to 4K motor units with full recruitment. No fibrillations or positive waves were seen. The biceps femoris muscle (long head) reveals 2 to 4K motor units with full recruitment. No fibrillations or positive waves were seen. The lumbosacral paraspinal muscles were tested at 3 levels, and revealed no abnormalities of insertional activity at all 3 levels tested. There was good  relaxation.   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 EMG evaluation of the right lower extremity shows very minimal distal chronic stable signs of neuropathic denervation, there is no evidence of an overlying lumbosacral radiculopathy.  Jill Alexanders MD 10/18/2016 4:57 PM  [facility] [address] [place], [place] [address]  Phone [phone] Fax [phone]

## 2016-10-18 NOTE — Progress Notes (Signed)
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  Further blood work will be done today, he reports symptoms of restless leg syndrome and a prescription for Mirapex was called in. He will call for any dose adjustments.

## 2016-10-18 NOTE — Progress Notes (Signed)
Please refer to EMG and nerve conduction study procedure note. 

## 2016-10-22 LAB — MULTIPLE MYELOMA PANEL, SERUM
ALBUMIN/GLOB SERPL: 1.7 (ref 0.7–1.7)
ALPHA2 GLOB SERPL ELPH-MCNC: 0.6 g/dL (ref 0.4–1.0)
Albumin SerPl Elph-Mcnc: 3.7 g/dL (ref 2.9–4.4)
Alpha 1: 0.3 g/dL (ref 0.0–0.4)
B-Globulin SerPl Elph-Mcnc: 1 g/dL (ref 0.7–1.3)
Gamma Glob SerPl Elph-Mcnc: 0.5 g/dL (ref 0.4–1.8)
Globulin, Total: 2.3 g/dL (ref 2.2–3.9)
IGA/IMMUNOGLOBULIN A, SERUM: 182 mg/dL (ref 61–437)
IGM (IMMUNOGLOBULIN M), SRM: 34 mg/dL (ref 15–143)
IgG (Immunoglobin G), Serum: 393 mg/dL — ABNORMAL LOW (ref 700–1600)
TOTAL PROTEIN: 6 g/dL (ref 6.0–8.5)

## 2016-10-22 LAB — ANA W/REFLEX: Anti Nuclear Antibody(ANA): NEGATIVE

## 2016-10-22 LAB — B. BURGDORFI ANTIBODIES

## 2016-10-22 LAB — ANGIOTENSIN CONVERTING ENZYME: ANGIO CONVERT ENZYME: 72 U/L (ref 14–82)

## 2016-10-22 LAB — RHEUMATOID FACTOR: Rhuematoid fact SerPl-aCnc: <10 IU/mL (ref 0.0–13.9)

## 2016-10-23 ENCOUNTER — Telehealth: Payer: Self-pay | Admitting: *Deleted

## 2016-10-23 NOTE — Telephone Encounter (Signed)
-----   Message from Kathrynn Ducking, MD sent at 10/22/2016  6:08 PM EDT -----  The blood work results are unremarkable. Please call the patient.  ----- Message ----- From: Lavone Neri Lab Results In Sent: 10/19/2016   7:44 AM To: Kathrynn Ducking, MD

## 2016-10-23 NOTE — Telephone Encounter (Signed)
I called patient. The patient still has some balance issues, I recommended physical therapy, he does not wish to do this at this time. He has fidgety movements with his hands, I'm not clear what this is all about, he was placed on Mirapex for restless leg syndrome, this seems to help some.  The patient does have a mild peripheral neuropathy.

## 2016-10-23 NOTE — Telephone Encounter (Signed)
Called and spoke with patient/wife about unremarkable lab results. Pt wants to know next steps d/t him still having balance problems. He wants to know what can be done about this. Advised I will send message to CW,MD

## 2016-11-16 ENCOUNTER — Telehealth: Payer: Self-pay | Admitting: *Deleted

## 2016-11-16 MED ORDER — PRAMIPEXOLE DIHYDROCHLORIDE 0.25 MG PO TABS: 0.2500 mg | ORAL_TABLET | Freq: Every day | ORAL | 3 refills | Status: DC

## 2016-11-16 NOTE — Telephone Encounter (Signed)
90 day rx. for Pramipexole escribed to [facility] per faxed request/fim

## 2016-11-29 DIAGNOSIS — M25561 Pain in right knee: Secondary | ICD-10-CM | POA: Diagnosis not present

## 2016-11-29 DIAGNOSIS — M542 Cervicalgia: Secondary | ICD-10-CM | POA: Diagnosis not present

## 2016-11-29 DIAGNOSIS — M545 Low back pain: Secondary | ICD-10-CM | POA: Diagnosis not present

## 2017-01-04 DIAGNOSIS — E7800 Pure hypercholesterolemia, unspecified: Secondary | ICD-10-CM | POA: Diagnosis not present

## 2017-01-04 DIAGNOSIS — B0229 Other postherpetic nervous system involvement: Secondary | ICD-10-CM | POA: Diagnosis not present

## 2017-01-04 DIAGNOSIS — Z6823 Body mass index (BMI) 23.0-23.9, adult: Secondary | ICD-10-CM | POA: Diagnosis not present

## 2017-01-04 DIAGNOSIS — I10 Essential (primary) hypertension: Secondary | ICD-10-CM | POA: Diagnosis not present

## 2017-01-04 DIAGNOSIS — F0390 Unspecified dementia without behavioral disturbance: Secondary | ICD-10-CM | POA: Diagnosis not present

## 2017-01-04 DIAGNOSIS — C92Z1 Other myeloid leukemia, in remission: Secondary | ICD-10-CM | POA: Diagnosis not present

## 2017-01-04 DIAGNOSIS — E538 Deficiency of other specified B group vitamins: Secondary | ICD-10-CM | POA: Diagnosis not present

## 2017-01-04 DIAGNOSIS — J449 Chronic obstructive pulmonary disease, unspecified: Secondary | ICD-10-CM | POA: Diagnosis not present

## 2017-01-04 DIAGNOSIS — I480 Paroxysmal atrial fibrillation: Secondary | ICD-10-CM | POA: Diagnosis not present

## 2017-01-04 DIAGNOSIS — Z7901 Long term (current) use of anticoagulants: Secondary | ICD-10-CM | POA: Diagnosis not present

## 2017-01-04 DIAGNOSIS — D692 Other nonthrombocytopenic purpura: Secondary | ICD-10-CM | POA: Diagnosis not present

## 2017-01-04 DIAGNOSIS — N401 Enlarged prostate with lower urinary tract symptoms: Secondary | ICD-10-CM | POA: Diagnosis not present

## 2017-01-16 DIAGNOSIS — I2510 Atherosclerotic heart disease of native coronary artery without angina pectoris: Secondary | ICD-10-CM | POA: Diagnosis not present

## 2017-01-19 ENCOUNTER — Ambulatory Visit (INDEPENDENT_AMBULATORY_CARE_PROVIDER_SITE_OTHER): Payer: Medicare Other

## 2017-01-19 ENCOUNTER — Other Ambulatory Visit: Payer: Self-pay | Admitting: Podiatry

## 2017-01-19 ENCOUNTER — Ambulatory Visit (INDEPENDENT_AMBULATORY_CARE_PROVIDER_SITE_OTHER): Payer: Medicare Other | Admitting: Podiatry

## 2017-01-19 ENCOUNTER — Encounter: Payer: Self-pay | Admitting: Podiatry

## 2017-01-19 DIAGNOSIS — M79672 Pain in left foot: Principal | ICD-10-CM

## 2017-01-19 DIAGNOSIS — M779 Enthesopathy, unspecified: Secondary | ICD-10-CM

## 2017-01-19 DIAGNOSIS — G629 Polyneuropathy, unspecified: Secondary | ICD-10-CM

## 2017-01-19 DIAGNOSIS — M79671 Pain in right foot: Secondary | ICD-10-CM | POA: Diagnosis not present

## 2017-01-19 MED ORDER — TRIAMCINOLONE ACETONIDE 10 MG/ML IJ SUSP
10.0000 mg | Freq: Once | INTRAMUSCULAR | Status: AC
  Administered 2017-01-19: 10 mg

## 2017-01-19 NOTE — Progress Notes (Signed)
   Subjective:    Patient ID: Todd Lloyd, male    DOB: Jun 16, 1940, 76 y.o.   MRN: [id number]  HPI Chief Complaint  Patient presents with  . Foot Pain    Bilateral; dorsal and toes; pt stated, "Has had numbness and cramping and makes it hard to walk"; x2 yrs    Review of Systems  Gastrointestinal: Positive for constipation.  Musculoskeletal: Positive for back pain and myalgias.  All other systems reviewed and are negative.      Objective:   Physical Exam        Assessment & Plan:

## 2017-01-23 ENCOUNTER — Encounter: Payer: Self-pay | Admitting: Neurology

## 2017-01-24 NOTE — Progress Notes (Signed)
Subjective:    Patient ID: Todd Lloyd, male   DOB: 76 y.o.   MRN: [id number]   HPI patient states she's had cramping and numbness in his feet for the last couple years and at times it makes it difficult for him to walk comfortably    Review of Systems  All other systems reviewed and are negative.       Objective:  Physical Exam  Constitutional: He appears well-developed and well-nourished.  Cardiovascular: Intact distal pulses.   Musculoskeletal: Normal range of motion.  Neurological: He is alert.  Skin: Skin is warm.  Nursing note and vitals reviewed.  neurovascular status was found to be intact with diminishment of just sharp dull and vibratory and DTR reflexes bilateral. Patient's noted to have good digital perfusion is well oriented 3 and does have moderate depression of the arch upon gait analysis. Patient's found to have a relative shuffling type gait but not significant     Assessment:   Moderate neuropathic symptoms bilateral with no apparent organic cause      Plan:    H&P x-rays reviewed and today advised on softer cushion shoes wide-type shoes and anti-inflammatories. Reappoint to recheck as needed  X-rays indicate no signs stress fracture advanced arthritis
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
## 2017-02-19 ENCOUNTER — Ambulatory Visit (INDEPENDENT_AMBULATORY_CARE_PROVIDER_SITE_OTHER): Payer: Medicare Other | Admitting: Neurology

## 2017-02-19 ENCOUNTER — Encounter: Payer: Self-pay | Admitting: Neurology

## 2017-02-19 VITALS — BP 100/70 | HR 80 | Ht 72.0 in | Wt 175.0 lb

## 2017-02-19 DIAGNOSIS — Z9481 Bone marrow transplant status: Secondary | ICD-10-CM

## 2017-02-19 DIAGNOSIS — G255 Other chorea: Secondary | ICD-10-CM | POA: Diagnosis not present

## 2017-02-19 DIAGNOSIS — M542 Cervicalgia: Secondary | ICD-10-CM

## 2017-02-19 DIAGNOSIS — R296 Repeated falls: Secondary | ICD-10-CM | POA: Diagnosis not present

## 2017-02-19 DIAGNOSIS — G609 Hereditary and idiopathic neuropathy, unspecified: Secondary | ICD-10-CM | POA: Diagnosis not present

## 2017-02-19 DIAGNOSIS — D72829 Elevated white blood cell count, unspecified: Secondary | ICD-10-CM | POA: Diagnosis not present

## 2017-02-19 DIAGNOSIS — R413 Other amnesia: Secondary | ICD-10-CM | POA: Diagnosis not present

## 2017-02-19 DIAGNOSIS — Z856 Personal history of leukemia: Secondary | ICD-10-CM | POA: Diagnosis not present

## 2017-02-19 DIAGNOSIS — R269 Unspecified abnormalities of gait and mobility: Secondary | ICD-10-CM

## 2017-02-19 DIAGNOSIS — R251 Tremor, unspecified: Secondary | ICD-10-CM

## 2017-02-19 NOTE — Patient Instructions (Signed)
1. We will order labs for you to have drawn when you leave the office today.   2. We have sent a referral to [facility] for your MRI and they will call you directly to schedule your appt. They are located at [place]. If you need to contact them directly please call [phone].

## 2017-02-22 LAB — LUPUS ANTICOAGULANT EVAL W/ REFLEX
PTT LA SCREEN: 38 s (ref <=40)
dRVVT Screen: 59 s — ABNORMAL HIGH (ref <=45)

## 2017-02-22 LAB — GLIADIN ANTIBODIES, SERUM
GLIADIN IGA: 6 U
Gliadin IgG: 2 Units

## 2017-02-22 LAB — CBC WITH DIFFERENTIAL/PLATELET
BASOS ABS: 17 {cells}/uL (ref 0–200)
Basophils Relative: 0.3 %
EOS PCT: 0.9 %
Eosinophils Absolute: 51 cells/uL (ref 15–500)
HCT: 39.9 % (ref 38.5–50.0)
Hemoglobin: 13.5 g/dL (ref 13.2–17.1)
Lymphs Abs: 1117 cells/uL (ref 850–3900)
MCH: 32.3 pg (ref 27.0–33.0)
MCHC: 33.8 g/dL (ref 32.0–36.0)
MCV: 95.5 fL (ref 80.0–100.0)
MPV: 10.3 fL (ref 7.5–12.5)
Monocytes Relative: 10.5 %
NEUTROS PCT: 68.7 %
Neutro Abs: 3916 cells/uL (ref 1500–7800)
Platelets: 189 10*3/uL (ref 140–400)
RBC: 4.18 10*6/uL — AB (ref 4.20–5.80)
RDW: 12.4 % (ref 11.0–15.0)
TOTAL LYMPHOCYTE: 19.6 %
WBC: 5.7 10*3/uL (ref 3.8–10.8)
WBCMIX: 599 {cells}/uL (ref 200–950)

## 2017-02-22 LAB — COMPREHENSIVE METABOLIC PANEL
AG Ratio: 2.5 (calc) (ref 1.0–2.5)
ALT: 12 U/L (ref 9–46)
AST: 15 U/L (ref 10–35)
Albumin: 4.2 g/dL (ref 3.6–5.1)
Alkaline phosphatase (APISO): 43 U/L (ref 40–115)
BUN: 12 mg/dL (ref 7–25)
CO2: 32 mmol/L (ref 20–32)
CREATININE: 1.15 mg/dL (ref 0.70–1.18)
Calcium: 9 mg/dL (ref 8.6–10.3)
Chloride: 101 mmol/L (ref 98–110)
GLUCOSE: 87 mg/dL (ref 65–99)
Globulin: 1.7 g/dL (calc) — ABNORMAL LOW (ref 1.9–3.7)
Potassium: 4.5 mmol/L (ref 3.5–5.3)
SODIUM: 138 mmol/L (ref 135–146)
TOTAL PROTEIN: 5.9 g/dL — AB (ref 6.1–8.1)
Total Bilirubin: 0.7 mg/dL (ref 0.2–1.2)

## 2017-02-22 LAB — HEPATIC FUNCTION PANEL
AG RATIO: 2.5 (calc) (ref 1.0–2.5)
ALBUMIN MSPROF: 4.2 g/dL (ref 3.6–5.1)
ALT: 12 U/L (ref 9–46)
AST: 15 U/L (ref 10–35)
Alkaline phosphatase (APISO): 43 U/L (ref 40–115)
BILIRUBIN TOTAL: 0.7 mg/dL (ref 0.2–1.2)
Bilirubin, Direct: 0.2 mg/dL (ref 0.0–0.2)
Globulin: 1.7 g/dL (calc) — ABNORMAL LOW (ref 1.9–3.7)
Indirect Bilirubin: 0.5 mg/dL (calc) (ref 0.2–1.2)
Total Protein: 5.9 g/dL — ABNORMAL LOW (ref 6.1–8.1)

## 2017-02-22 LAB — ANTIPHOSPHOLOPID AB PANEL
Beta2-Glycoprotein I (IgG): <9 SGU (ref <=20)
Beta2-Glycoprotein I (IgM): <9 SMU (ref <=20)
PHOSPHATIDYLSERINE AB (IGA): <20 U/mL (ref <20)
Phosphatidylserine (IgG): <10 U/mL (ref <10)
Phosphatidylserine (IgM): <25 U/mL (ref <25)

## 2017-02-22 LAB — IRON,TIBC AND FERRITIN PANEL
%SAT: 24 % (ref 15–60)
FERRITIN: 22 ng/mL (ref 20–380)
Iron: 86 ug/dL (ref 50–180)
TIBC: 352 mcg/dL (calc) (ref 250–425)

## 2017-02-22 LAB — RFLX DRVVT CONFRIM: dRVVT Confirm: NEGATIVE

## 2017-02-22 LAB — SEDIMENTATION RATE: Sed Rate: 2 mm/h (ref 0–20)

## 2017-02-22 LAB — PARATHYROID HORMONE, INTACT (NO CA): PTH: 76 pg/mL — ABNORMAL HIGH (ref 14–64)

## 2017-02-22 LAB — TSH: TSH: 2.71 m[IU]/L (ref 0.40–4.50)

## 2017-02-22 LAB — CERULOPLASMIN: CERULOPLASMIN: 20 mg/dL (ref 18–36)

## 2017-02-26 ENCOUNTER — Encounter: Payer: Self-pay | Admitting: Gastroenterology

## 2017-02-26 ENCOUNTER — Telehealth: Payer: Self-pay | Admitting: Neurology

## 2017-02-26 NOTE — Telephone Encounter (Signed)
Patient made aware labs okay. 

## 2017-02-26 NOTE — Telephone Encounter (Signed)
-----   Message from [place], DO sent at 02/26/2017  8:01 AM EDT ----- Tell patient labs are okay.  Will awaiting imaging

## 2017-02-28 DIAGNOSIS — M542 Cervicalgia: Secondary | ICD-10-CM | POA: Diagnosis not present

## 2017-03-06 ENCOUNTER — Other Ambulatory Visit: Payer: Medicare Other

## 2017-03-08 ENCOUNTER — Ambulatory Visit
Admission: RE | Admit: 2017-03-08 | Discharge: 2017-03-08 | Disposition: A | Discharge status: Home / Self Care | Payer: Medicare Other | Source: Ambulatory Visit | Attending: Neurology | Admitting: Neurology

## 2017-03-08 DIAGNOSIS — R296 Repeated falls: Secondary | ICD-10-CM | POA: Diagnosis not present

## 2017-03-08 DIAGNOSIS — G255 Other chorea: Secondary | ICD-10-CM

## 2017-03-08 DIAGNOSIS — M542 Cervicalgia: Secondary | ICD-10-CM

## 2017-03-08 DIAGNOSIS — Z9481 Bone marrow transplant status: Secondary | ICD-10-CM

## 2017-03-08 DIAGNOSIS — R251 Tremor, unspecified: Secondary | ICD-10-CM

## 2017-03-08 DIAGNOSIS — R269 Unspecified abnormalities of gait and mobility: Secondary | ICD-10-CM

## 2017-03-08 DIAGNOSIS — Z856 Personal history of leukemia: Secondary | ICD-10-CM

## 2017-03-08 MED ORDER — GADOBENATE DIMEGLUMINE 529 MG/ML IV SOLN
15.0000 mL | Freq: Once | INTRAVENOUS | Status: AC | PRN
  Administered 2017-03-08: 15 mL via INTRAVENOUS

## 2017-03-09 ENCOUNTER — Telehealth: Payer: Self-pay | Admitting: Neurology

## 2017-03-09 NOTE — Telephone Encounter (Signed)
Patient made aware of results.  Referral faxed to [facility] at [phone] with confirmation received. They will contact the patient to schedule.

## 2017-03-09 NOTE — Telephone Encounter (Signed)
-----   Message from [place], DO sent at 03/09/2017 11:59 AM EDT ----- Let pt know that has some significant degenerative change especially at the C4-5 level and I would like him to be eval by neurosx.  If agreeable, refer to Dr. Vertell Limber

## 2017-03-19 DIAGNOSIS — Z23 Encounter for immunization: Secondary | ICD-10-CM | POA: Diagnosis not present

## 2017-03-20 ENCOUNTER — Telehealth: Payer: Self-pay | Admitting: Neurology

## 2017-03-20 NOTE — Telephone Encounter (Signed)
Note received from [facility] that patient has an appt with Dr. Vertell Limber on 05/02/17 at 3:30 pm.

## 2017-03-22 ENCOUNTER — Encounter: Payer: Self-pay | Admitting: Neurology

## 2017-03-22 ENCOUNTER — Ambulatory Visit (INDEPENDENT_AMBULATORY_CARE_PROVIDER_SITE_OTHER): Payer: Medicare Other | Admitting: Neurology

## 2017-03-22 ENCOUNTER — Encounter (INDEPENDENT_AMBULATORY_CARE_PROVIDER_SITE_OTHER): Payer: Self-pay

## 2017-03-22 VITALS — BP 147/63 | HR 61 | Ht 72.0 in | Wt 178.5 lb

## 2017-03-22 DIAGNOSIS — G2581 Restless legs syndrome: Secondary | ICD-10-CM

## 2017-03-22 DIAGNOSIS — R269 Unspecified abnormalities of gait and mobility: Secondary | ICD-10-CM

## 2017-03-22 DIAGNOSIS — G609 Hereditary and idiopathic neuropathy, unspecified: Secondary | ICD-10-CM

## 2017-03-22 DIAGNOSIS — R413 Other amnesia: Secondary | ICD-10-CM

## 2017-03-22 MED ORDER — PRAMIPEXOLE DIHYDROCHLORIDE 0.5 MG PO TABS: 0.5000 mg | ORAL_TABLET | Freq: Every day | ORAL | 5 refills | Status: DC
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
## 2017-03-22 NOTE — Patient Instructions (Signed)
   We will increase the Mirapex dose at night to 0.5 mg at night.

## 2017-05-23 DIAGNOSIS — E538 Deficiency of other specified B group vitamins: Secondary | ICD-10-CM | POA: Diagnosis not present

## 2017-05-23 DIAGNOSIS — I10 Essential (primary) hypertension: Secondary | ICD-10-CM | POA: Diagnosis not present

## 2017-05-23 DIAGNOSIS — Z125 Encounter for screening for malignant neoplasm of prostate: Secondary | ICD-10-CM | POA: Diagnosis not present

## 2017-05-23 DIAGNOSIS — E7800 Pure hypercholesterolemia, unspecified: Secondary | ICD-10-CM | POA: Diagnosis not present

## 2017-05-23 DIAGNOSIS — R82998 Other abnormal findings in urine: Secondary | ICD-10-CM | POA: Diagnosis not present

## 2017-05-30 DIAGNOSIS — I10 Essential (primary) hypertension: Secondary | ICD-10-CM | POA: Diagnosis not present

## 2017-05-30 DIAGNOSIS — C92Z1 Other myeloid leukemia, in remission: Secondary | ICD-10-CM | POA: Diagnosis not present

## 2017-05-30 DIAGNOSIS — K219 Gastro-esophageal reflux disease without esophagitis: Secondary | ICD-10-CM | POA: Diagnosis not present

## 2017-05-30 DIAGNOSIS — G2581 Restless legs syndrome: Secondary | ICD-10-CM | POA: Diagnosis not present

## 2017-05-30 DIAGNOSIS — E7800 Pure hypercholesterolemia, unspecified: Secondary | ICD-10-CM | POA: Diagnosis not present

## 2017-05-30 DIAGNOSIS — J449 Chronic obstructive pulmonary disease, unspecified: Secondary | ICD-10-CM | POA: Diagnosis not present

## 2017-05-30 DIAGNOSIS — F332 Major depressive disorder, recurrent severe without psychotic features: Secondary | ICD-10-CM | POA: Diagnosis not present

## 2017-05-30 DIAGNOSIS — N401 Enlarged prostate with lower urinary tract symptoms: Secondary | ICD-10-CM | POA: Diagnosis not present

## 2017-05-30 DIAGNOSIS — Z23 Encounter for immunization: Secondary | ICD-10-CM | POA: Diagnosis not present

## 2017-05-30 DIAGNOSIS — Z6823 Body mass index (BMI) 23.0-23.9, adult: Secondary | ICD-10-CM | POA: Diagnosis not present

## 2017-05-30 DIAGNOSIS — F0390 Unspecified dementia without behavioral disturbance: Secondary | ICD-10-CM | POA: Diagnosis not present

## 2017-05-30 DIAGNOSIS — Z1389 Encounter for screening for other disorder: Secondary | ICD-10-CM | POA: Diagnosis not present

## 2017-05-30 DIAGNOSIS — Z0000 Encounter for general adult medical examination without abnormal findings: Secondary | ICD-10-CM | POA: Diagnosis not present

## 2017-06-05 DIAGNOSIS — M545 Low back pain: Secondary | ICD-10-CM | POA: Diagnosis not present

## 2017-06-05 DIAGNOSIS — Z1212 Encounter for screening for malignant neoplasm of rectum: Secondary | ICD-10-CM | POA: Diagnosis not present

## 2017-06-11 DIAGNOSIS — M542 Cervicalgia: Secondary | ICD-10-CM | POA: Diagnosis not present

## 2017-06-11 DIAGNOSIS — M4802 Spinal stenosis, cervical region: Secondary | ICD-10-CM | POA: Diagnosis not present

## 2017-06-11 DIAGNOSIS — M545 Low back pain: Secondary | ICD-10-CM | POA: Diagnosis not present

## 2017-06-11 DIAGNOSIS — G959 Disease of spinal cord, unspecified: Secondary | ICD-10-CM | POA: Diagnosis not present

## 2017-06-11 DIAGNOSIS — R269 Unspecified abnormalities of gait and mobility: Secondary | ICD-10-CM | POA: Diagnosis not present

## 2017-07-02 DIAGNOSIS — H0100A Unspecified blepharitis right eye, upper and lower eyelids: Secondary | ICD-10-CM | POA: Diagnosis not present

## 2017-07-02 DIAGNOSIS — H353122 Nonexudative age-related macular degeneration, left eye, intermediate dry stage: Secondary | ICD-10-CM | POA: Diagnosis not present

## 2017-07-02 DIAGNOSIS — H31003 Unspecified chorioretinal scars, bilateral: Secondary | ICD-10-CM | POA: Diagnosis not present

## 2017-07-02 DIAGNOSIS — H5213 Myopia, bilateral: Secondary | ICD-10-CM | POA: Diagnosis not present

## 2017-07-19 DIAGNOSIS — G4761 Periodic limb movement disorder: Secondary | ICD-10-CM | POA: Diagnosis not present

## 2017-07-19 DIAGNOSIS — E7849 Other hyperlipidemia: Secondary | ICD-10-CM | POA: Diagnosis not present

## 2017-07-19 DIAGNOSIS — I482 Chronic atrial fibrillation: Secondary | ICD-10-CM | POA: Diagnosis not present

## 2017-07-19 DIAGNOSIS — J449 Chronic obstructive pulmonary disease, unspecified: Secondary | ICD-10-CM | POA: Diagnosis not present

## 2017-07-19 DIAGNOSIS — Z7901 Long term (current) use of anticoagulants: Secondary | ICD-10-CM | POA: Diagnosis not present

## 2017-07-19 DIAGNOSIS — I2510 Atherosclerotic heart disease of native coronary artery without angina pectoris: Secondary | ICD-10-CM | POA: Diagnosis not present

## 2017-08-07 DIAGNOSIS — M545 Low back pain: Secondary | ICD-10-CM | POA: Diagnosis not present

## 2017-09-24 ENCOUNTER — Encounter: Payer: Self-pay | Admitting: Neurology

## 2017-09-24 ENCOUNTER — Ambulatory Visit (INDEPENDENT_AMBULATORY_CARE_PROVIDER_SITE_OTHER): Payer: Medicare Other | Admitting: Neurology

## 2017-09-24 VITALS — BP 146/74 | HR 76 | Ht 72.0 in | Wt 174.0 lb

## 2017-09-24 DIAGNOSIS — R413 Other amnesia: Secondary | ICD-10-CM

## 2017-09-24 DIAGNOSIS — G2581 Restless legs syndrome: Secondary | ICD-10-CM

## 2017-09-24 DIAGNOSIS — R269 Unspecified abnormalities of gait and mobility: Secondary | ICD-10-CM

## 2017-09-24 DIAGNOSIS — G609 Hereditary and idiopathic neuropathy, unspecified: Secondary | ICD-10-CM | POA: Diagnosis not present

## 2017-09-24 MED ORDER — PRAMIPEXOLE DIHYDROCHLORIDE 0.75 MG PO TABS: 0.7500 mg | ORAL_TABLET | Freq: Every day | ORAL | 1 refills | Status: DC
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
## 2017-09-24 NOTE — Patient Instructions (Signed)
   We will go up on the Mirapex 0.75 mg at night. Call if this is not effective.

## 2017-11-07 DIAGNOSIS — M545 Low back pain: Secondary | ICD-10-CM | POA: Diagnosis not present

## 2017-11-15 ENCOUNTER — Encounter: Payer: Self-pay | Admitting: Emergency Medicine

## 2017-11-15 ENCOUNTER — Ambulatory Visit (INDEPENDENT_AMBULATORY_CARE_PROVIDER_SITE_OTHER): Payer: Medicare Other | Admitting: Emergency Medicine

## 2017-11-15 DIAGNOSIS — F17200 Nicotine dependence, unspecified, uncomplicated: Secondary | ICD-10-CM | POA: Diagnosis not present

## 2017-11-15 DIAGNOSIS — J449 Chronic obstructive pulmonary disease, unspecified: Secondary | ICD-10-CM

## 2017-11-15 MED ORDER — TIOTROPIUM BROMIDE-OLODATEROL 2.5-2.5 MCG/ACT IN AERS: 2.0000 | INHALATION_SPRAY | Freq: Every day | RESPIRATORY_TRACT | 0 refills | Status: DC

## 2017-11-15 NOTE — Progress Notes (Signed)
Subjective:    Patient ID: Todd Lloyd, male    DOB: 03-24-1941, 77 y.o.   MRN: [id number]  Shortness of Breath  Pertinent negatives include no ear pain, fever, headaches, leg swelling, rash, rhinorrhea, sore throat, vomiting or wheezing.   77 year old man, former smoker (50+    pack years), with a history of CAD, hypertension, atrial fibrillation, GERD, CML post bone marrow transplant in 1990, and COPD that was diagnosed approximately 10 yrs ago and has been managed on Spiriva and Symbicort. He is referred today by Dr. Osborne Casco for progressive exertional dyspnea. 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 He can walk about 1 block before stopping to rest which is a change. No cough. He does hear wheeze, both w exertion and at rest. No CP. He just had a reassuring cards eval by Dr Wynonia Lawman 2 months, but is planning to see him again today.  CXR 10/27 w Dr Osborne Casco reported as clear. He is using SABA with some freq when doing yard work.        He was just treated with a prednisone taper beginning yesterday 03/18/15, first dose yesterday, hasn't noticed any changes.   ROV 04/29/15 -- follow up for dyspnea, tobacco use, COPD.  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 CBC and TSH last time were reassuring.  He has been on spiriva and symbicort.  He saw Dr Wynonia Lawman, got a reassuring report after a stress test. He still feels very limited, has dyspnea with working in the yard and splitting wood, cleaning house. He quit smoking 14 yrs ago.   ROV 07/28/15 -- follow-up visit for dyspnea in the setting of severe obstructive lung disease and COPD. He has a positive bronchodilator response on spirometry. He has been treated with Spiriva and Symbicort. He did not desaturate on ambulation 03/19/15 office visit. He returns today  describing continued exertional SOB, doing house chores, etc. He doesn't really see any improvement with SABA when he has these episodes.   ROV 8/231/7 -- follow up visit for COPD, severe obstruction. At his last visit we changed symbicort and spiriva to Anoro. His breathing is much better, his exercise tolerance is much improved. His albuterol use has gone down. No flares since last time. He has been out working in the yard.   Acute OV 11/15/17 --Mr. Richard presents today for an acute office visit.  I followed him for severe obstructive lung disease, last seen about 2 years ago.  He is been managed with Anoro. He reports today that his breathing has been worse for the last 6 months. He can exert for 5 minutes and then has to stop. He is using his albuterol 3-4 x a day. Occasional wheeze.  FEV1 2016 44% predicted. No CP. Follow annually w Dr Wynonia Lawman.  Missed his LD-CT in December, wants to get it done.     Review of Systems  Constitutional: Negative for fever and unexpected weight change.  HENT: Negative for congestion, dental problem, ear pain, nosebleeds, postnasal drip, rhinorrhea, sinus pressure, sneezing, sore throat and trouble swallowing.   Eyes: Negative for redness and itching.  Respiratory: Positive for shortness of breath. Negative for cough, chest tightness and wheezing.   Cardiovascular: Negative for palpitations and leg swelling.  Gastrointestinal: Negative for nausea and  vomiting.  Genitourinary: Negative for dysuria.  Musculoskeletal: Negative for joint swelling.  Skin: Negative for rash.  Neurological: Negative for headaches.  Hematological: Does not bruise/bleed easily.  Psychiatric/Behavioral: Negative for dysphoric mood. The patient is not nervous/anxious.    Past Medical History:  Diagnosis Date  . Acid reflux   . Atrial fibrillation ([place])   . Colon polyps   . COPD (chronic obstructive pulmonary disease) ([place])   . Gait abnormality 09/12/2016  . High cholesterol   .  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 Memory difficulties 09/12/2016  . Peripheral neuropathy 10/18/2016  . RLS (restless legs syndrome) 10/18/2016     Family History  Problem Relation Age of Onset  . Colon cancer Father   . Heart disease Father   . Leukemia Maternal Uncle   . Melanoma Mother   . Dementia Mother   . ALS Sister   . Lung cancer Brother      Social History   Socioeconomic History  . Marital status: Married    Spouse name: Hoyle Sauer  . Number of children: 0  . Years of education: 80  . Highest education level: Not on file  Occupational History  . Occupation: Retired    Comment: Chartered loss adjuster  Social Needs  . Financial resource strain: Not on file  . Food insecurity:    Worry: Not on file    Inability: Not on file  . Transportation needs:    Medical: Not on file    Non-medical: Not on file  Tobacco Use  . Smoking status: Former Smoker    Packs/day: 2.00    Years: 43.00    Pack years: 86.00    Types: Cigarettes    Last attempt to quit: 05/22/2000    Years since quitting: 17.4  . Smokeless tobacco: Never Used  . Tobacco comment: Counseled to remain smoke free  Substance and Sexual Activity  . Alcohol use: No    Alcohol/week: 0.0 oz  . Drug use: No  . Sexual activity: Not on file  Lifestyle  . Physical activity:    Days per week: Not on file    Minutes per session: Not on file  . Stress: Not on file  Relationships  . Social connections:    Talks on phone: Not on file    Gets together: Not on file    Attends religious service: Not on file    Active member of club or organization: Not on file    Attends meetings of clubs or organizations: Not on file    Relationship status: Not on file  . Intimate partner violence:    Fear of current or ex partner: Not on file    Emotionally abused: Not on file    Physically abused: Not on file    Forced sexual activity: Not on file  Other Topics Concern  . 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 Lyrica [Pregabalin]     confusion  . Morphine And Related     confusion     Outpatient Medications Prior to Visit  Medication Sig Dispense Refill  . albuterol (PROVENTIL HFA;VENTOLIN HFA) 108 (90 BASE) MCG/ACT inhaler Inhale 2 puffs into the lungs every 6 (  six) hours as needed. For shortness of breath    . digoxin (LANOXIN) 0.25 MG tablet Take 250 mcg by mouth daily.    Marland Kitchen donepezil (ARICEPT) 10 MG tablet Take 10 mg by mouth at bedtime.    Marland Kitchen ezetimibe (ZETIA) 10 MG tablet Take 1 tablet by mouth daily.    Marland Kitchen omeprazole (PRILOSEC) 20 MG capsule Take 20 mg by mouth daily.    Marland Kitchen oxyCODONE-acetaminophen (PERCOCET) 10-325 MG tablet Take 1 tablet by mouth 2 (two) times daily.    . pramipexole (MIRAPEX) 0.75 MG tablet Take 1 tablet (0.75 mg total) by mouth at bedtime. 90 tablet 1  . rivaroxaban (XARELTO) 20 MG TABS tablet Take 20 mg by mouth daily with supper.    . traZODone (DESYREL) 150 MG tablet Take 300 mg by mouth at bedtime.    Marland Kitchen umeclidinium-vilanterol (ANORO ELLIPTA) 62.5-25 MCG/INH AEPB Inhale 1 puff into the lungs daily. 1 each 0   Facility-Administered Medications Prior to Visit  Medication Dose Route Frequency Provider Last Rate Last Dose  . 0.9 %  sodium chloride infusion  500 mL Intravenous Continuous Milus Banister, MD             Objective:   Physical Exam Vitals:   11/15/17 1505  BP: 114/70  Pulse: 78  Temp: (!) 97.4 F (36.3 C)  TempSrc: Oral  SpO2: 94%  Weight: 173 lb 9.6 oz (78.7 kg)  Height: 6' (1.829 m)   Gen: Pleasant, well-nourished, in no distress,  normal affect  ENT: No lesions,  mouth clear,  oropharynx clear, no postnasal drip  Neck: No JVD, no stridor  Lungs: No use of accessory muscles, distant, no wheeze on a  normal respiratory cycle, he does wheeze on forced expiration.  Cardiovascular: RRR, heart sounds normal, no murmur or gallops, no peripheral edema  Musculoskeletal: No deformities, no cyanosis or clubbing  Neuro: alert, non focal  Skin: Warm, no lesions or rashes      Assessment & Plan:  COPD (chronic obstructive pulmonary disease) (HCC) Slowly progressive dyspnea over several months.  I am concerned that this reflects some evolving hypoxemia.  He was well saturated at rest here we will check it with ambulation.  He knows that he may need oxygen at some point.  He is on Anoro and initially felt that he benefited.  We can do a trial change in Stiolto but I suspect that both will be similar.  He needs repeat pulmonary function testing to assess for interval decline.  Tobacco use disorder He was in the low-dose CT scan screening program and was supposed to be scanned in December 2018.  This was not done.  We will try to get him back into the system, get a scan done soon  [name], MD, PhD 11/15/2017, 3:33 PM [facility] [phone] or if no answer [phone]

## 2017-11-15 NOTE — Assessment & Plan Note (Signed)
He was in the low-dose CT scan screening program and was supposed to be scanned in December 2018.  This was not done.  We will try to get him back into the system, get a scan done soon

## 2017-11-15 NOTE — Patient Instructions (Signed)
We will repeat your breathing tests at your next office visit.  Walking oximetry today on room air.  If your oxygen level drops then we may be able to benefit you by ordering oxygen for you to use with exertion at home. Temporarily stop Anoro. We will start Stiolto 2 puffs once a day to see if you get more benefit.  Take this for 1 month and if it helps you more than we will order through your pharmacy and continue. We will set you back up for the low-dose CT scan lung cancer screening program.  You were due for a scan back in December 2018. Follow with Dr Lamonte Sakai in 1 month or next available

## 2017-11-15 NOTE — Addendum Note (Signed)
Addended by: Vivia Ewing on: 11/15/2017 04:29 PM   Modules accepted: Orders

## 2017-11-15 NOTE — Assessment & Plan Note (Signed)
Slowly progressive dyspnea over several months.  I am concerned that this reflects some evolving hypoxemia.  He was well saturated at rest here we will check it with ambulation.  He knows that he may need oxygen at some point.  He is on Anoro and initially felt that he benefited.  We can do a trial change in Stiolto but I suspect that both will be similar.  He needs repeat pulmonary function testing to assess for interval decline.

## 2017-11-26 DIAGNOSIS — G2581 Restless legs syndrome: Secondary | ICD-10-CM | POA: Diagnosis not present

## 2017-11-26 DIAGNOSIS — F0390 Unspecified dementia without behavioral disturbance: Secondary | ICD-10-CM | POA: Diagnosis not present

## 2017-11-26 DIAGNOSIS — I480 Paroxysmal atrial fibrillation: Secondary | ICD-10-CM | POA: Diagnosis not present

## 2017-11-26 DIAGNOSIS — I10 Essential (primary) hypertension: Secondary | ICD-10-CM | POA: Diagnosis not present

## 2017-11-26 DIAGNOSIS — D692 Other nonthrombocytopenic purpura: Secondary | ICD-10-CM | POA: Diagnosis not present

## 2017-11-26 DIAGNOSIS — M5489 Other dorsalgia: Secondary | ICD-10-CM | POA: Diagnosis not present

## 2017-11-26 DIAGNOSIS — E538 Deficiency of other specified B group vitamins: Secondary | ICD-10-CM | POA: Diagnosis not present

## 2017-11-26 DIAGNOSIS — E7800 Pure hypercholesterolemia, unspecified: Secondary | ICD-10-CM | POA: Diagnosis not present

## 2017-11-26 DIAGNOSIS — N3281 Overactive bladder: Secondary | ICD-10-CM | POA: Diagnosis not present

## 2017-11-26 DIAGNOSIS — Z7901 Long term (current) use of anticoagulants: Secondary | ICD-10-CM | POA: Diagnosis not present

## 2017-11-26 DIAGNOSIS — M4692 Unspecified inflammatory spondylopathy, cervical region: Secondary | ICD-10-CM | POA: Diagnosis not present

## 2017-11-26 DIAGNOSIS — Z6823 Body mass index (BMI) 23.0-23.9, adult: Secondary | ICD-10-CM | POA: Diagnosis not present

## 2017-11-30 ENCOUNTER — Telehealth: Payer: Self-pay | Admitting: Emergency Medicine

## 2017-11-30 DIAGNOSIS — J449 Chronic obstructive pulmonary disease, unspecified: Secondary | ICD-10-CM

## 2017-11-30 MED ORDER — TIOTROPIUM BROMIDE-OLODATEROL 2.5-2.5 MCG/ACT IN AERS: 2.0000 | INHALATION_SPRAY | Freq: Every day | RESPIRATORY_TRACT | 3 refills | Status: DC

## 2017-11-30 NOTE — Telephone Encounter (Signed)
Called and spoke with patient, he states the medication was working and he will use the prescription. Prescription sent. Nothing further needed.

## 2017-12-17 DIAGNOSIS — R269 Unspecified abnormalities of gait and mobility: Secondary | ICD-10-CM | POA: Diagnosis not present

## 2017-12-17 DIAGNOSIS — M4802 Spinal stenosis, cervical region: Secondary | ICD-10-CM | POA: Diagnosis not present

## 2017-12-17 DIAGNOSIS — G959 Disease of spinal cord, unspecified: Secondary | ICD-10-CM | POA: Diagnosis not present

## 2017-12-17 DIAGNOSIS — I10 Essential (primary) hypertension: Secondary | ICD-10-CM | POA: Diagnosis not present

## 2017-12-17 DIAGNOSIS — R413 Other amnesia: Secondary | ICD-10-CM | POA: Diagnosis not present

## 2017-12-25 ENCOUNTER — Ambulatory Visit (INDEPENDENT_AMBULATORY_CARE_PROVIDER_SITE_OTHER): Payer: Medicare Other | Admitting: Emergency Medicine

## 2017-12-25 ENCOUNTER — Ambulatory Visit (INDEPENDENT_AMBULATORY_CARE_PROVIDER_SITE_OTHER)
Admission: RE | Admit: 2017-12-25 | Discharge: 2017-12-25 | Disposition: A | Discharge status: Home / Self Care | Payer: Medicare Other | Source: Ambulatory Visit | Attending: Emergency Medicine | Admitting: Emergency Medicine

## 2017-12-25 ENCOUNTER — Encounter: Payer: Self-pay | Admitting: Emergency Medicine

## 2017-12-25 DIAGNOSIS — F17200 Nicotine dependence, unspecified, uncomplicated: Secondary | ICD-10-CM

## 2017-12-25 DIAGNOSIS — J449 Chronic obstructive pulmonary disease, unspecified: Secondary | ICD-10-CM | POA: Diagnosis not present

## 2017-12-25 NOTE — Patient Instructions (Signed)
Please continue Stiolto 2 puffs once daily. Keep albuterol available to use 2 puffs if needed for shortness of breath, chest tightness, wheezing. We will perform spirometry today to compare with your prior pulmonary function testing. We will perform a chest x-ray today. Follow with Dr Lamonte Sakai in 6 months or sooner if you have any problems

## 2017-12-25 NOTE — Progress Notes (Signed)
Subjective:    Patient ID: Todd Lloyd, male    DOB: 10-10-1940, 77 y.o.   MRN: [id number]   Shortness of Breath   Pertinent negatives include no ear pain, fever, headaches, leg swelling, rash, rhinorrhea, sore throat, vomiting or wheezing.   77 y.o. man, former smoker (50+    pack years), with a history of CAD, hypertension, atrial fibrillation, GERD, CML post bone marrow transplant in 1990, and COPD that was diagnosed approximately 10 yrs ago and has been managed on Spiriva and Symbicort. He is referred today by Dr. Osborne Lloyd for progressive exertional dyspnea. 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 He can walk about 1 block before stopping to rest which is a change. No cough. He does hear wheeze, both w exertion and at rest. No CP. He just had a reassuring cards eval by Dr Todd Lloyd 2 months, but is planning to see him again today.  CXR 10/27 w Dr Todd Lloyd reported as clear. He is using SABA with some freq when doing yard work.        He was just treated with a prednisone taper beginning yesterday 03/18/15, first dose yesterday, hasn't noticed any changes.   ROV 04/29/15 -- follow up for dyspnea, tobacco use, COPD.  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 CBC and TSH last time were reassuring.  He has been on spiriva and symbicort.  He saw Dr Todd Lloyd, got a reassuring report after a stress test. He still feels very limited, has dyspnea with working in the yard and splitting wood, cleaning house. He quit smoking 14 yrs ago.   ROV 07/28/15 -- follow-up visit for dyspnea in the setting of severe obstructive lung disease and COPD. He has a positive bronchodilator response on spirometry. He has been treated with Spiriva and Symbicort. He did not desaturate on ambulation 03/19/15 office visit. He returns today  describing continued exertional SOB, doing house chores, etc. He doesn't really see any improvement with SABA when he has these episodes.   ROV 8/231/7 -- follow up visit for COPD, severe obstruction. At his last visit we changed symbicort and spiriva to Anoro. His breathing is much better, his exercise tolerance is much improved. His albuterol use has gone down. No flares since last time. He has been out working in the yard.   Acute OV 11/15/17 --[name] presents today for an acute office visit.  I followed him for severe obstructive lung disease, last seen about 2 years ago.  He is been managed with Anoro. He reports today that his breathing has been worse for the last 6 months. He can exert for 5 minutes and then has to stop. He is using his albuterol 3-4 x a day. Occasional wheeze.  FEV1 2016 44% predicted. No CP. Follow annually w Dr Todd Lloyd.  Missed his LD-CT in December, wants to get it done.   ROV 12/25/17 --follow-up visit today for 77 year old gentleman with severe obstructive lung disease. I saw him just over 1 month ago.  At his visit I changed his Anoro to Stiolto to see if he'd get more benefit. He believes that it has helped him some, although he is still experiencing some exertional dyspnea. He very rarely uses his albuterol. He hasn't restarted LDCT program.    Review of  Systems  Constitutional: Negative for fever and unexpected weight change.  HENT: Negative for congestion, dental problem, ear pain, nosebleeds, postnasal drip, rhinorrhea, sinus pressure, sneezing, sore throat and trouble swallowing.   Eyes: Negative for redness and itching.  Respiratory: Positive for shortness of breath. Negative for cough, chest tightness and wheezing.   Cardiovascular: Negative for palpitations and leg swelling.  Gastrointestinal: Negative for nausea and vomiting.  Genitourinary: Negative for dysuria.  Musculoskeletal: Negative for joint swelling.  Skin: Negative for rash.  Neurological:  Negative for headaches.  Hematological: Does not bruise/bleed easily.  Psychiatric/Behavioral: Negative for dysphoric mood. The patient is not nervous/anxious.    Past Medical History:  Diagnosis Date  . Acid reflux   . Atrial fibrillation ([place])   . Colon polyps   . COPD (chronic obstructive pulmonary disease) ([place])   . Gait abnormality 09/12/2016  . High cholesterol   . Leukemia ([place])    s/p bone marrow transplant in 1990  . Memory difficulties 09/12/2016  . Peripheral neuropathy 10/18/2016  . RLS (restless legs syndrome) 10/18/2016     Family History  Problem Relation Age of Onset  . Colon cancer Father   . Heart disease Father   . Leukemia Maternal Uncle   . Melanoma Mother   . Dementia Mother   . ALS Sister   . Lung cancer Brother      Social History   Socioeconomic History  . Marital status: Married    Spouse name: Todd Lloyd  . Number of children: 0  . Years of education: 28  . Highest education level: Not on file  Occupational History  . Occupation: Retired    Comment: Chartered loss adjuster  Social Needs  . Financial resource strain: Not on file  . Food insecurity:    Worry: Not on file    Inability: Not on file  . Transportation needs:    Medical: Not on file    Non-medical: Not on file  Tobacco Use  . Smoking status: Former Smoker    Packs/day: 2.00    Years: 43.00    Pack years: 86.00    Types: Cigarettes    Last attempt to quit: 05/22/2000    Years since quitting: 17.6  . Smokeless tobacco: Never Used  . Tobacco comment: Counseled to remain smoke free  Substance and Sexual Activity  . Alcohol use: No    Alcohol/week: 0.0 oz  . Drug use: No  . Sexual activity: Not on file  Lifestyle  . Physical activity:    Days per week: Not on file    Minutes per session: Not on file  . Stress: Not on file  Relationships  . Social connections:    Talks on phone: Not on file    Gets together: Not on file    Attends religious service: Not on file    Active  member of club or organization: Not on file    Attends meetings of clubs or organizations: Not on file    Relationship status: Not on file  . Intimate partner violence:    Fear of current or ex partner: Not on file    Emotionally abused: Not on file    Physically abused: Not on file    Forced sexual activity: Not on file  Other Topics Concern  . 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 Lyrica [Pregabalin]     confusion  . Morphine And Related     confusion     Outpatient Medications Prior to Visit  Medication Sig Dispense Refill  . albuterol (PROVENTIL HFA;VENTOLIN HFA) 108 (90 BASE) MCG/ACT inhaler Inhale 2 puffs into the lungs every 6 (six) hours as needed. For shortness of breath    . digoxin (LANOXIN) 0.25 MG tablet Take 250 mcg by mouth daily.    Marland Kitchen donepezil (ARICEPT) 10 MG tablet Take 10 mg by mouth at bedtime.    Marland Kitchen ezetimibe (ZETIA) 10 MG tablet Take 1 tablet by mouth daily.    Marland Kitchen omeprazole (PRILOSEC) 20 MG capsule Take 20 mg by mouth daily.    Marland Kitchen oxyCODONE-acetaminophen (PERCOCET) 10-325 MG tablet Take 1 tablet by mouth 2 (two) times daily.    . pramipexole (MIRAPEX) 0.75 MG tablet Take 1 tablet (0.75 mg total) by mouth at bedtime. 90 tablet 1  . rivaroxaban (XARELTO) 20 MG TABS tablet Take 20 mg by mouth daily with supper.    . Tiotropium Bromide-Olodaterol (STIOLTO RESPIMAT) 2.5-2.5 MCG/ACT AERS Inhale 2 puffs into the lungs daily. 1 Inhaler 3  . traZODone (DESYREL) 150 MG tablet Take 300 mg by mouth at bedtime.    Marland Kitchen umeclidinium-vilanterol (ANORO ELLIPTA) 62.5-25 MCG/INH AEPB Inhale 1 puff into the lungs daily. 1 each 0   Facility-Administered Medications Prior to Visit  Medication Dose Route Frequency Provider Last Rate Last Dose  . 0.9 %  sodium  chloride infusion  500 mL Intravenous Continuous Milus Banister, MD             Objective:   Physical Exam Vitals:   12/25/17 1449  BP: 134/90  Pulse: 85  SpO2: 95%  Weight: 179 lb (81.2 kg)   Gen: Pleasant, well-nourished, in no distress,  normal affect  ENT: No lesions,  mouth clear,  oropharynx clear, no postnasal drip  Neck: No JVD, no stridor  Lungs: No use of accessory muscles, distant, no wheeze   Cardiovascular: RRR, heart sounds normal, no murmur or gallops, no peripheral edema  Musculoskeletal: No deformities, no cyanosis or clubbing  Neuro: alert, non focal  Skin: Warm, no lesions or rashes      Assessment & Plan:  Tobacco use disorder He has reached age 28, his prior CT scans of the chest showed stable nodular disease and his smoking is remote.  He can now be considered to be low risk.  He does not qualify for low-dose CT scan screening anymore.  I will perform a chest x-ray today to ensure no significant interval change and to reassure him.  COPD (chronic obstructive pulmonary disease) ([place]) He believes that he has improved some on the Stiolto although he does still have some intermittent exertional dyspnea.  He gets some relief from albuterol.  He did not desaturate on ambulation at his last visit.  I'd like to continue the Stiolto for now follow him clinically   [name], MD, PhD 12/25/2017, 3:21 PM [facility] [phone] or if no answer [phone]

## 2017-12-25 NOTE — Addendum Note (Signed)
Addended by: Desmond Dike C on: 12/25/2017 03:23 PM   Modules accepted: Orders

## 2017-12-25 NOTE — Assessment & Plan Note (Signed)
He believes that he has improved some on the Stiolto although he does still have some intermittent exertional dyspnea.  He gets some relief from albuterol.  He did not desaturate on ambulation at his last visit.  I'd like to continue the Stiolto for now follow him clinically

## 2017-12-25 NOTE — Assessment & Plan Note (Signed)
He has reached age 77, his prior CT scans of the chest showed stable nodular disease and his smoking is remote.  He can now be considered to be low risk.  He does not qualify for low-dose CT scan screening anymore.  I will perform a chest x-ray today to ensure no significant interval change and to reassure him.

## 2018-01-16 DIAGNOSIS — Z23 Encounter for immunization: Secondary | ICD-10-CM | POA: Diagnosis not present

## 2018-02-01 DIAGNOSIS — H6123 Impacted cerumen, bilateral: Secondary | ICD-10-CM | POA: Diagnosis not present

## 2018-02-12 DIAGNOSIS — M545 Low back pain: Secondary | ICD-10-CM | POA: Diagnosis not present

## 2018-02-21 DIAGNOSIS — M170 Bilateral primary osteoarthritis of knee: Secondary | ICD-10-CM | POA: Diagnosis not present

## 2018-03-01 DIAGNOSIS — M170 Bilateral primary osteoarthritis of knee: Secondary | ICD-10-CM | POA: Diagnosis not present

## 2018-03-08 DIAGNOSIS — M170 Bilateral primary osteoarthritis of knee: Secondary | ICD-10-CM | POA: Diagnosis not present

## 2018-03-08 DIAGNOSIS — M47816 Spondylosis without myelopathy or radiculopathy, lumbar region: Secondary | ICD-10-CM | POA: Diagnosis not present

## 2018-03-10 DIAGNOSIS — M545 Low back pain: Secondary | ICD-10-CM | POA: Diagnosis not present

## 2018-03-12 ENCOUNTER — Other Ambulatory Visit: Payer: Self-pay | Admitting: Orthopaedic Surgery

## 2018-03-12 DIAGNOSIS — M545 Low back pain, unspecified: Secondary | ICD-10-CM

## 2018-03-15 ENCOUNTER — Ambulatory Visit
Admission: RE | Admit: 2018-03-15 | Discharge: 2018-03-15 | Disposition: A | Discharge status: Home / Self Care | Payer: Medicare Other | Source: Ambulatory Visit | Attending: Orthopaedic Surgery | Admitting: Orthopaedic Surgery

## 2018-03-15 ENCOUNTER — Other Ambulatory Visit: Payer: Self-pay | Admitting: Neurology

## 2018-03-15 DIAGNOSIS — M545 Low back pain, unspecified: Secondary | ICD-10-CM

## 2018-04-01 ENCOUNTER — Other Ambulatory Visit: Payer: Self-pay | Admitting: Neurology

## 2018-04-02 ENCOUNTER — Other Ambulatory Visit: Payer: Self-pay

## 2018-04-02 ENCOUNTER — Ambulatory Visit (INDEPENDENT_AMBULATORY_CARE_PROVIDER_SITE_OTHER): Payer: Medicare Other | Admitting: Neurology

## 2018-04-02 ENCOUNTER — Encounter: Payer: Self-pay | Admitting: Neurology

## 2018-04-02 VITALS — BP 165/94 | HR 75 | Resp 18 | Ht 72.0 in | Wt 179.0 lb

## 2018-04-02 DIAGNOSIS — R413 Other amnesia: Secondary | ICD-10-CM

## 2018-04-02 DIAGNOSIS — G609 Hereditary and idiopathic neuropathy, unspecified: Secondary | ICD-10-CM

## 2018-04-02 DIAGNOSIS — G2581 Restless legs syndrome: Secondary | ICD-10-CM | POA: Diagnosis not present

## 2018-04-02 DIAGNOSIS — R269 Unspecified abnormalities of gait and mobility: Secondary | ICD-10-CM | POA: Diagnosis not present

## 2018-04-02 MED ORDER — MEMANTINE HCL 5 MG PO TABS: ORAL_TABLET | ORAL | 0 refills | Status: DC

## 2018-04-02 MED ORDER — PRAMIPEXOLE DIHYDROCHLORIDE 0.75 MG PO TABS: 0.7500 mg | ORAL_TABLET | Freq: Every day | ORAL | 1 refills | Status: DC
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
## 2018-04-06 ENCOUNTER — Other Ambulatory Visit: Payer: Self-pay | Admitting: Emergency Medicine

## 2018-04-06 DIAGNOSIS — J449 Chronic obstructive pulmonary disease, unspecified: Secondary | ICD-10-CM

## 2018-04-24 ENCOUNTER — Other Ambulatory Visit: Payer: Self-pay | Admitting: Neurology

## 2018-04-24 NOTE — Telephone Encounter (Signed)
[facility] and will see if he is tolerating the Namenda.  If so, will send in the 10mg  dose if ok with CKW/fim

## 2018-04-28 ENCOUNTER — Other Ambulatory Visit: Payer: Self-pay | Admitting: Neurology

## 2018-07-08 ENCOUNTER — Telehealth: Payer: Self-pay | Admitting: Neurology

## 2018-07-08 ENCOUNTER — Telehealth: Payer: Self-pay | Admitting: *Deleted

## 2018-07-08 NOTE — Telephone Encounter (Signed)
FYI

## 2018-07-08 NOTE — Telephone Encounter (Signed)
I contacted the pt and advised we would be able to see him on 07/12/18 at 12 pm with a check in time of 11:30. Pt was agreeable.

## 2018-07-08 NOTE — Telephone Encounter (Signed)
Pt  Has called stating he is having leg pain that even prevents him from walking from time to time.  Pt states for all he knows it could be a blood clot. Pt was encouraged to go to local ED if pain is that concerning.  Pt declined because of having to dit there all day.  Pt has accepted 1st available and is on wait list.  Pt is asking for a call so he can be advised by Dr Todd Lloyd what to do about this leg pain

## 2018-07-08 NOTE — Telephone Encounter (Signed)
I called the patient.  The patient is having significant increase in pain in the back and down the right leg to the ankle.  This has been going on for several months but has gotten much worse recently.  The patient will need a revisit for an evaluation.  He did have a recent CT scan of the lumbar spine done.  He is on anticoagulant therapy.

## 2018-07-08 NOTE — Telephone Encounter (Signed)
Joyce Copa records over to Dr. Leone Brand office per [facility] request. Pt has appt in April 2020

## 2018-07-11 ENCOUNTER — Telehealth: Payer: Self-pay

## 2018-07-11 NOTE — Telephone Encounter (Signed)
I contacted the Todd Lloyd and was able to advise we would be closed tomorrow (07/12/18) due to the possibilty of bad weather and we would need reschedule his 12 pm f/u with Dr. [name]. Todd Lloyd was offered at 12 pm f/u on 07/16/18 and was agreeable.

## 2018-07-12 ENCOUNTER — Ambulatory Visit: Payer: Self-pay | Admitting: Neurology

## 2018-07-16 ENCOUNTER — Ambulatory Visit (INDEPENDENT_AMBULATORY_CARE_PROVIDER_SITE_OTHER): Payer: Medicare Other | Admitting: Neurology

## 2018-07-16 ENCOUNTER — Encounter: Payer: Self-pay | Admitting: Neurology

## 2018-07-16 VITALS — BP 152/84 | HR 79 | Ht 72.0 in | Wt 184.3 lb

## 2018-07-16 DIAGNOSIS — G8929 Other chronic pain: Secondary | ICD-10-CM | POA: Diagnosis not present

## 2018-07-16 DIAGNOSIS — M545 Low back pain, unspecified: Secondary | ICD-10-CM

## 2018-07-16 DIAGNOSIS — G609 Hereditary and idiopathic neuropathy, unspecified: Secondary | ICD-10-CM | POA: Diagnosis not present

## 2018-07-16 DIAGNOSIS — R269 Unspecified abnormalities of gait and mobility: Secondary | ICD-10-CM | POA: Diagnosis not present

## 2018-07-16 DIAGNOSIS — M5441 Lumbago with sciatica, right side: Secondary | ICD-10-CM | POA: Diagnosis not present

## 2018-07-16 DIAGNOSIS — R413 Other amnesia: Secondary | ICD-10-CM

## 2018-07-16 DIAGNOSIS — G2581 Restless legs syndrome: Secondary | ICD-10-CM

## 2018-07-16 HISTORY — DX: Low back pain, unspecified: M54.50

## 2018-07-16 HISTORY — DX: Other chronic pain: G89.29

## 2018-07-16 MED ORDER — PREDNISONE 5 MG PO TABS: ORAL_TABLET | ORAL | 0 refills | Status: DC

## 2018-07-16 MED ORDER — GABAPENTIN 100 MG PO CAPS: 100.0000 mg | ORAL_CAPSULE | Freq: Three times a day (TID) | ORAL | 3 refills | Status: DC

## 2018-07-16 MED ORDER — PRAMIPEXOLE DIHYDROCHLORIDE 1 MG PO TABS: 1.0000 mg | ORAL_TABLET | Freq: Every day | ORAL | 1 refills | Status: DC
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
## 2018-08-07 DIAGNOSIS — Z79899 Other long term (current) drug therapy: Secondary | ICD-10-CM | POA: Diagnosis not present

## 2018-08-07 DIAGNOSIS — R030 Elevated blood-pressure reading, without diagnosis of hypertension: Secondary | ICD-10-CM | POA: Diagnosis not present

## 2018-08-07 DIAGNOSIS — Z6825 Body mass index (BMI) 25.0-25.9, adult: Secondary | ICD-10-CM | POA: Diagnosis not present

## 2018-08-12 ENCOUNTER — Telehealth: Payer: Self-pay

## 2018-08-12 ENCOUNTER — Encounter: Payer: Medicare Other | Admitting: Neurology

## 2018-08-12 NOTE — Telephone Encounter (Signed)
I contacted the pt and was able to speak with him. I advised due to the covid 19 concerns our office will be canceling nerve conduction studies for the next two weeks. Pt was advised we would be in touch to reschedule once we are cleared to complete the studies again. Pt was agreeable and voiced understanding.

## 2018-09-18 ENCOUNTER — Telehealth: Payer: Self-pay | Admitting: Cardiovascular Disease

## 2018-09-19 ENCOUNTER — Telehealth: Payer: Self-pay | Admitting: Cardiovascular Disease

## 2018-09-19 ENCOUNTER — Other Ambulatory Visit: Payer: Self-pay

## 2018-09-19 ENCOUNTER — Ambulatory Visit (INDEPENDENT_AMBULATORY_CARE_PROVIDER_SITE_OTHER): Payer: Medicare Other | Admitting: Cardiovascular Disease

## 2018-09-19 ENCOUNTER — Encounter: Payer: Self-pay | Admitting: Cardiovascular Disease

## 2018-09-19 VITALS — BP 143/81 | HR 82

## 2018-09-19 DIAGNOSIS — R0602 Shortness of breath: Secondary | ICD-10-CM | POA: Diagnosis not present

## 2018-09-19 DIAGNOSIS — I712 Thoracic aortic aneurysm, without rupture: Secondary | ICD-10-CM | POA: Diagnosis not present

## 2018-09-19 DIAGNOSIS — Z79899 Other long term (current) drug therapy: Secondary | ICD-10-CM

## 2018-09-19 DIAGNOSIS — Z5181 Encounter for therapeutic drug level monitoring: Secondary | ICD-10-CM | POA: Diagnosis not present

## 2018-09-19 DIAGNOSIS — I10 Essential (primary) hypertension: Secondary | ICD-10-CM

## 2018-09-19 DIAGNOSIS — I7121 Aneurysm of the ascending aorta, without rupture: Secondary | ICD-10-CM

## 2018-09-19 MED ORDER — LOSARTAN POTASSIUM 25 MG PO TABS: 25.0000 mg | ORAL_TABLET | Freq: Every day | ORAL | 3 refills | Status: DC

## 2018-09-19 NOTE — Telephone Encounter (Signed)
Left message to call back to go over patient and wife's AVS and schedule 1 month follow up with Doreene Burke PA
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
## 2018-09-19 NOTE — Patient Instructions (Addendum)
Medication Instructions:  START LOSARTAN 25 MG DAILY   If you need a refill on your cardiac medications before your next appointment, please call your pharmacy.   Lab work: FASTING LP/CMET/DIG LEVEL/TSH IN 1 WEEK  DO NOT TAKE YOUR DIGOXIN THAT MORNING   If you have labs (blood work) drawn today and your tests are completely normal, you will receive your results only by: Marland Kitchen MyChart Message (if you have MyChart) OR . A paper copy in the mail If you have any lab test that is abnormal or we need to change your treatment, we will call you to review the results.  Testing/Procedures: Your physician has requested that you have an echocardiogram. Echocardiography is a painless test that uses sound waves to create images of your heart. It provides your doctor with information about the size and shape of your heart and how well your heart's chambers and valves are working. This procedure takes approximately one hour. There are no restrictions for this procedure. [address]  Follow-Up: Your physician recommends that you schedule a follow-up appointment in: [place]  10/21/18 at 1:30  Your physician wants you to follow-up in: 4 months  You will receive a reminder letter in the mail two months in advance. If you don't receive a letter, please call our office to schedule the follow-up appointment.   Any Other Special Instructions Will Be Listed Below (If Applicable).  MONITOR AND LOG YOUR BLOOD PRESSURE AT HOME     Echocardiogram An echocardiogram is a procedure that uses painless sound waves (ultrasound) to produce an image of the heart. Images from an echocardiogram can provide important information about:  Signs of coronary artery disease (CAD).  Aneurysm detection. An aneurysm is a weak or damaged part of an artery wall that bulges out from the normal force of blood pumping through the body.  Heart size and shape. Changes in the size or shape of the  heart can be associated with certain conditions, including heart failure, aneurysm, and CAD.  Heart muscle function.  Heart valve function.  Signs of a past heart attack.  Fluid buildup around the heart.  Thickening of the heart muscle.  A tumor or infectious growth around the heart valves. Tell a health care provider about:  Any allergies you have.  All medicines you are taking, including vitamins, herbs, eye drops, creams, and over-the-counter medicines.  Any blood disorders you have.  Any surgeries you have had.  Any medical conditions you have.  Whether you are pregnant or may be pregnant. What are the risks? Generally, this is a safe procedure. However, problems may occur, including:  Allergic reaction to dye (contrast) that may be used during the procedure. What happens before the procedure? No specific preparation is needed. You may eat and drink normally. What happens during the procedure?   An IV tube may be inserted into one of your veins.  You may receive contrast through this tube. A contrast is an injection that improves the quality of the pictures from your heart.  A gel will be applied to your chest.  A wand-like tool (transducer) will be moved over your chest. The gel will help to transmit the sound waves from the transducer.  The sound waves will harmlessly bounce off of your heart to allow the heart images to be captured in real-time motion. The images will be recorded on a computer. The procedure may vary among health care providers  and hospitals. What happens after the procedure?  You may return to your normal, everyday life, including diet, activities, and medicines, unless your health care provider tells you not to do that. Summary  An echocardiogram is a procedure that uses painless sound waves (ultrasound) to produce an image of the heart.  Images from an echocardiogram can provide important information about the size and shape of your  heart, heart muscle function, heart valve function, and fluid buildup around your heart.  You do not need to do anything to prepare before this procedure. You may eat and drink normally.  After the echocardiogram is completed, you may return to your normal, everyday life, unless your health care provider tells you not to do that. This information is not intended to replace advice given to you by your health care provider. Make sure you discuss any questions you have with your health care provider. Document Released: 05/05/2000 Document Revised: 06/10/2016 Document Reviewed: 06/10/2016 Elsevier Interactive Patient Education  2019 [employer].

## 2018-09-19 NOTE — Telephone Encounter (Signed)
New message   Patient states that he is returning a couple of phone calls from [place]. Please call.

## 2018-09-20 ENCOUNTER — Encounter: Payer: Self-pay | Admitting: Cardiovascular Disease

## 2018-09-25 NOTE — Telephone Encounter (Signed)
Called and reminded patient needs is labs, stated he did receive paperwork and will get it.

## 2018-09-27 ENCOUNTER — Ambulatory Visit (INDEPENDENT_AMBULATORY_CARE_PROVIDER_SITE_OTHER): Payer: Medicare Other | Admitting: Primary Care

## 2018-09-27 ENCOUNTER — Other Ambulatory Visit: Payer: Self-pay

## 2018-09-27 ENCOUNTER — Telehealth: Payer: Self-pay | Admitting: Emergency Medicine

## 2018-09-27 ENCOUNTER — Encounter: Payer: Self-pay | Admitting: Primary Care

## 2018-09-27 DIAGNOSIS — J441 Chronic obstructive pulmonary disease with (acute) exacerbation: Secondary | ICD-10-CM | POA: Diagnosis not present

## 2018-09-27 MED ORDER — PREDNISONE 10 MG PO TABS: ORAL_TABLET | ORAL | 0 refills | Status: DC

## 2018-09-27 MED ORDER — FLUTICASONE-UMECLIDIN-VILANT 100-62.5-25 MCG/INH IN AEPB: 1.0000 | INHALATION_SPRAY | Freq: Every day | RESPIRATORY_TRACT | 0 refills | Status: DC

## 2018-09-27 NOTE — Addendum Note (Signed)
Addended by: Joella Prince on: 09/27/2018 02:30 PM   Modules accepted: Orders

## 2018-09-27 NOTE — Patient Instructions (Addendum)
  Stop [place]- take 1 puff daily (given two samples today, if you find beneficial please call and we will send RX to [place])  Back up - Albuterol rescue inhaler 2 puffs every 4-6 hours for shortness of breath/wheezing  Sending in additional prednisone taper (40mg  x 3 days; 30mg  x 3 days; 20mg  x 3 days; 10mg  x 3 days; stop)   Follow up in 2 weeks televisit

## 2018-09-27 NOTE — Progress Notes (Signed)
Virtual Visit via Video Note  I connected with Todd Lloyd on 09/27/18 at  2:00 PM EDT by a video enabled telemedicine application and verified that I am speaking with the correct person using two identifiers.  Location: Patient: Home Provider: Office   I discussed the limitations of evaluation and management by telemedicine and the availability of in person appointments. The patient expressed understanding and agreed to proceed.  History of Present Illness: 78 year old male, former smoker. PMH afib, COPD, RLS, tobacco use disorder. Patient Dr. Lamonte Sakai, last seen 12/25/17. CT chest showed stable nodular disease and smoking is remote, he is considered low risk. Maintained on Stiolto 2 puffs daily, prn albuterol.   09/27/2018 Patient called today for acute visit with complaints of increased labored breathing over the last couple of weeks. States that he is sob with slight activity. Associated wheezing. Continues Stiolto as prescribed, using Albuterol rescue inhaler more. No significant cough, URI or allergy symptoms. Declined nebulizer machine. No sick contact. Afebrile.   Observations/Objective:  - No significant shortness of breath, wheezing or cough noted during phone conversation  Assessment and Plan:  COPD - Increased symptom burden  - Change stiolto to TRELEGY (1 puff daily) - Continue Albuterol hfa, 2 puffs every 4-6 hours prn sob/wheezing - NO signs of URI, abx not indicated at this time - RX prednisone taper (40mg  x 3 days; 30mg  x 3 days; 20mg  x 3 days; 10mg  x 3 days) - Follow up in 2 weeks or sooner if needed    Follow Up Instructions:  2 week televisit   I discussed the assessment and treatment plan with the patient. The patient was provided an opportunity to ask questions and all were answered. The patient agreed with the plan and demonstrated an understanding of the instructions.   The patient was advised to call back or seek an in-person evaluation if the symptoms  worsen or if the condition fails to improve as anticipated.  I provided 22 minutes of non-face-to-face time during this encounter.   Martyn Ehrich, NP

## 2018-09-27 NOTE — Telephone Encounter (Signed)
Spoke with pt and pt stated he was SOB and wheezing x3 weeks.  Getting worse.  Televisit scheduled with BW at 2pm.  Call on [phone].  Nothing further is needed.

## 2018-10-02 DIAGNOSIS — Z79899 Other long term (current) drug therapy: Secondary | ICD-10-CM | POA: Diagnosis not present

## 2018-10-02 DIAGNOSIS — I10 Essential (primary) hypertension: Secondary | ICD-10-CM | POA: Diagnosis not present

## 2018-10-02 DIAGNOSIS — Z5181 Encounter for therapeutic drug level monitoring: Secondary | ICD-10-CM | POA: Diagnosis not present

## 2018-10-02 DIAGNOSIS — R0602 Shortness of breath: Secondary | ICD-10-CM | POA: Diagnosis not present

## 2018-10-03 ENCOUNTER — Telehealth: Payer: Self-pay

## 2018-10-03 LAB — COMPREHENSIVE METABOLIC PANEL
ALT: 13 IU/L (ref 0–44)
AST: 16 IU/L (ref 0–40)
Albumin/Globulin Ratio: 2.3 — ABNORMAL HIGH (ref 1.2–2.2)
Albumin: 4.4 g/dL (ref 3.7–4.7)
Alkaline Phosphatase: 54 IU/L (ref 39–117)
BUN/Creatinine Ratio: 14 (ref 10–24)
BUN: 16 mg/dL (ref 8–27)
Bilirubin Total: 0.7 mg/dL (ref 0.0–1.2)
CO2: 29 mmol/L (ref 20–29)
Calcium: 9.2 mg/dL (ref 8.6–10.2)
Chloride: 102 mmol/L (ref 96–106)
Creatinine, Ser: 1.18 mg/dL (ref 0.76–1.27)
GFR calc Af Amer: 68 mL/min/{1.73_m2} (ref >59)
GFR calc non Af Amer: 59 mL/min/{1.73_m2} — ABNORMAL LOW (ref >59)
Globulin, Total: 1.9 g/dL (ref 1.5–4.5)
Glucose: 87 mg/dL (ref 65–99)
Potassium: 4.6 mmol/L (ref 3.5–5.2)
Sodium: 145 mmol/L — ABNORMAL HIGH (ref 134–144)
Total Protein: 6.3 g/dL (ref 6.0–8.5)

## 2018-10-03 LAB — CBC WITH DIFFERENTIAL/PLATELET
Basophils Absolute: 0 10*3/uL (ref 0.0–0.2)
Basos: 1 %
EOS (ABSOLUTE): 0.1 10*3/uL (ref 0.0–0.4)
Eos: 1 %
Hematocrit: 39.3 % (ref 37.5–51.0)
Hemoglobin: 13.4 g/dL (ref 13.0–17.7)
Immature Grans (Abs): 0 10*3/uL (ref 0.0–0.1)
Immature Granulocytes: 0 %
Lymphocytes Absolute: 0.9 10*3/uL (ref 0.7–3.1)
Lymphs: 14 %
MCH: 31.8 pg (ref 26.6–33.0)
MCHC: 34.1 g/dL (ref 31.5–35.7)
MCV: 93 fL (ref 79–97)
Monocytes Absolute: 0.8 10*3/uL (ref 0.1–0.9)
Monocytes: 13 %
Neutrophils Absolute: 4.5 10*3/uL (ref 1.4–7.0)
Neutrophils: 71 %
Platelets: 201 10*3/uL (ref 150–450)
RBC: 4.22 x10E6/uL (ref 4.14–5.80)
RDW: 12 % (ref 11.6–15.4)
WBC: 6.3 10*3/uL (ref 3.4–10.8)

## 2018-10-03 LAB — LIPID PANEL
Chol/HDL Ratio: 2.5 ratio (ref 0.0–5.0)
Cholesterol, Total: 172 mg/dL (ref 100–199)
HDL: 69 mg/dL (ref >39)
LDL Calculated: 90 mg/dL (ref 0–99)
Triglycerides: 63 mg/dL (ref 0–149)
VLDL Cholesterol Cal: 13 mg/dL (ref 5–40)

## 2018-10-03 LAB — TSH: TSH: 4.41 u[IU]/mL (ref 0.450–4.500)

## 2018-10-03 LAB — DIGOXIN LEVEL: Digoxin, Serum: 0.5 ng/mL (ref 0.5–0.9)

## 2018-10-03 NOTE — Telephone Encounter (Signed)
Pt has called back, he is scheduled for 05-19 with a check in of 2:15pm

## 2018-10-03 NOTE — Telephone Encounter (Signed)
I called and left a voicemail for patient asking that he give [place] a call back at his convenience to reschedule his nerve conduction with Dr. [name].

## 2018-10-08 ENCOUNTER — Ambulatory Visit (INDEPENDENT_AMBULATORY_CARE_PROVIDER_SITE_OTHER): Payer: Medicare Other | Admitting: Neurology

## 2018-10-08 ENCOUNTER — Encounter: Payer: Self-pay | Admitting: Neurology

## 2018-10-08 ENCOUNTER — Other Ambulatory Visit: Payer: Self-pay

## 2018-10-08 DIAGNOSIS — G609 Hereditary and idiopathic neuropathy, unspecified: Secondary | ICD-10-CM

## 2018-10-08 DIAGNOSIS — G8929 Other chronic pain: Secondary | ICD-10-CM

## 2018-10-08 MED ORDER — GABAPENTIN 300 MG PO CAPS: 300.0000 mg | ORAL_CAPSULE | Freq: Two times a day (BID) | ORAL | 2 refills | Status: DC

## 2018-10-08 NOTE — Procedures (Signed)
     HISTORY:  Todd Lloyd is a 78 year old gentleman with a history of pain beginning in the right hip and going down behind the right knee and discomfort from the knee to the foot.  This may be associated with a sensation of collapse of the right knee.  The pain is worsened over time.  He has had a recent MRI of the lumbar spine in October 2019 that did not show definite nerve root impingement.  He is being evaluated for the pain.  He has a history of a peripheral neuropathy.  NERVE CONDUCTION STUDIES:  Nerve conduction studies were performed on both lower extremities.  The distal motor latencies for the peroneal and posterior tibial nerves were within normal limits bilaterally with normal motor amplitudes for these nerves bilaterally.  Slowing was seen for the peroneal and posterior tibial nerves bilaterally.  Prolongation of the peroneal sensory latencies were seen bilaterally, normal sural sensory latencies were seen bilaterally.  The F-wave latencies for the posterior tibial nerves were prolonged bilaterally.  EMG STUDIES:  EMG study was performed on the right lower extremity:  The tibialis anterior muscle reveals 2 to 4K motor units with full recruitment. No fibrillations or positive waves were seen. The peroneus tertius muscle reveals 2 to 5K motor units with decreased recruitment. No fibrillations or positive waves were seen. The medial gastrocnemius muscle reveals 1 to 4K motor units with decresed recruitment. No fibrillations or positive waves were seen. The vastus lateralis muscle reveals 2 to 4K motor units with full recruitment. No fibrillations or positive waves were seen. The iliopsoas muscle reveals 2 to 5K motor units with full recruitment. No fibrillations or positive waves were seen. The biceps femoris muscle (long head) reveals 2 to 4K motor units with full recruitment. No fibrillations or positive waves were seen. The lumbosacral paraspinal muscles were tested at 3  levels, and revealed no abnormalities of insertional activity at all 3 levels tested. There was good relaxation.   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  This is similar to the prior study done in 2018.  The EMG study of the right lower extremity shows primarily distal chronic stable signs of denervation again consistent with the diagnosis of peripheral neuropathy.  There is no clear evidence of an overlying lumbosacral radiculopathy.  Jill Alexanders MD 10/08/2018 4:29 PM  [facility] [address] [place], [place] [address]  Phone [phone] Fax [phone]

## 2018-10-08 NOTE — Progress Notes (Signed)
Please refer to EMG and nerve conduction procedure note.  

## 2018-10-08 NOTE — Progress Notes (Addendum)
The patient comes in for EMG nerve conduction study evaluation today.  Nerve conduction study again shows evidence of a peripheral neuropathy.  EMG of the right leg does not clearly show evidence of an overlying lumbosacral radiculopathy.  The patient is still having a lot of pain behind the right knee and pain going down the leg from the knee as well as pain from the hip level down the leg.  The pain may be worse with weightbearing.  He feels as if the right knee may collapse on him.  He apparently had an MRI done of the lumbar spine in October 2019, the patient may have the disc at home for me to look at.  The patient is on blood thinners, cannot have epidural steroid injection unless this is discontinued.  The patient will be started back on gabapentin, starting on the 300 mg capsule twice daily.       [place]    Nerve / Sites Muscle Latency Ref. Amplitude Ref. Rel Amp Segments Distance Velocity Ref. Area    ms ms mV mV %  cm m/s m/s mVms  L Peroneal - EDB     Ankle EDB 6.0 ?6.5 2.4 ?2.0 100 Ankle - EDB 9   7.1     Fib head EDB 14.1  1.7  71.2 Fib head - Ankle 31 38 ?44 6.9     Pop fossa EDB 16.7  1.8  108 Pop fossa - Fib head 10 38 ?44 7.8         Pop fossa - Ankle      R Peroneal - EDB     Ankle EDB 4.9 ?6.5 2.2 ?2.0 100 Ankle - EDB 9   7.2     Fib head EDB 13.6  1.9  84.8 Fib head - Ankle 33 38 ?44 5.9     Pop fossa EDB 16.3  1.8  92.2 Pop fossa - Fib head 10 38 ?44 5.7         Pop fossa - Ankle      L Tibial - AH     Ankle AH 4.1 ?5.8 4.0 ?4.0 100 Ankle - AH 9   18.3     Pop fossa AH 15.4  3.3  83.5 Pop fossa - Ankle 39 35 ?41 17.0  R Tibial - AH     Ankle AH 4.6 ?5.8 7.8 ?4.0 100 Ankle - AH 9   17.5     Pop fossa AH 16.3  4.4  56 Pop fossa - Ankle 39 34 ?41 14.9             SNC    Nerve / Sites Rec. Site Peak Lat Ref.  Amp Ref. Segments Distance    ms ms V V  cm  L Sural - Ankle (Calf)     Calf Ankle 4.2 ?4.4 5 ?6 Calf - Ankle 14  R Sural - Ankle (Calf)     Calf Ankle 4.0  ?4.4 4 ?6 Calf - Ankle 14  L Superficial peroneal - Ankle     Lat leg Ankle 5.5 ?4.4 2 ?6 Lat leg - Ankle 14  R Superficial peroneal - Ankle     Lat leg Ankle 4.9 ?4.4 1 ?6 Lat leg - Ankle 14              F  Wave    Nerve F Lat Ref.   ms ms  L Tibial - AH 65.2 ?56.0  R Tibial - AH 64.9 ?56.0

## 2018-10-09 ENCOUNTER — Telehealth: Payer: Self-pay | Admitting: *Deleted

## 2018-10-09 DIAGNOSIS — E7800 Pure hypercholesterolemia, unspecified: Secondary | ICD-10-CM

## 2018-10-09 DIAGNOSIS — I10 Essential (primary) hypertension: Secondary | ICD-10-CM

## 2018-10-09 DIAGNOSIS — Z5181 Encounter for therapeutic drug level monitoring: Secondary | ICD-10-CM

## 2018-10-09 MED ORDER — ROSUVASTATIN CALCIUM 10 MG PO TABS: 10.0000 mg | ORAL_TABLET | Freq: Every day | ORAL | 5 refills | Status: DC

## 2018-10-09 NOTE — Telephone Encounter (Signed)
Advised patient of lab results and adding of Crestor. Patient verbalized understanding

## 2018-10-09 NOTE — Telephone Encounter (Signed)
-----   Message from Skeet Latch, MD sent at 10/07/2018  3:46 PM EDT ----- Labs are normal with the exception of the LDL cholesterol, which should be <70.   Recommend rosuvastatin 10mg .  Repeat lipids/CMP in 6-8 weeks.

## 2018-10-11 ENCOUNTER — Other Ambulatory Visit: Payer: Self-pay

## 2018-10-11 ENCOUNTER — Encounter: Payer: Self-pay | Admitting: Primary Care

## 2018-10-11 ENCOUNTER — Telehealth: Payer: Self-pay | Admitting: Primary Care

## 2018-10-11 ENCOUNTER — Ambulatory Visit (INDEPENDENT_AMBULATORY_CARE_PROVIDER_SITE_OTHER): Payer: Medicare Other | Admitting: Primary Care

## 2018-10-11 DIAGNOSIS — J449 Chronic obstructive pulmonary disease, unspecified: Secondary | ICD-10-CM

## 2018-10-11 DIAGNOSIS — R0609 Other forms of dyspnea: Secondary | ICD-10-CM | POA: Diagnosis not present

## 2018-10-11 MED ORDER — TIOTROPIUM BROMIDE-OLODATEROL 2.5-2.5 MCG/ACT IN AERS: 2.0000 | INHALATION_SPRAY | Freq: Every day | RESPIRATORY_TRACT | 5 refills | Status: AC

## 2018-10-11 NOTE — Progress Notes (Deleted)
Virtual Visit via Telephone Note  I connected with JALEEL ALLEN on 10/11/18 at  2:00 PM EDT by telephone and verified that I am speaking with the correct person using two identifiers.  Location: Patient: Home Provider: Office   I discussed the limitations, risks, security and privacy concerns of performing an evaluation and management service by telephone and the availability of in person appointments. I also discussed with the patient that there may be a patient responsible charge related to this service. The patient expressed understanding and agreed to proceed.   History of Present Illness: 78 year old male, former smoker. PMH afib, COPD, RLS, tobacco use disorder. Patient Dr. Lamonte Sakai, last seen 12/25/17. CT chest showed stable nodular disease and smoking is remote, he is considered low risk. Maintained on Stiolto 2 puffs daily, prn albuterol.   09/27/2018 Patient called today for acute visit with complaints of increased labored breathing over the last couple of weeks. States that he is sob with slight activity. Associated wheezing. Continues Stiolto as prescribed, using Albuterol rescue inhaler more. No significant cough, URI or allergy symptoms. Declined nebulizer machine. No sick contact. Afebrile.   10/11/2018 Patient called for follow-up visit. During last visit Stiolto was changed to Trelegy and he was given additional prednisone taper.    Observations/Objective:   Assessment and Plan:   Follow Up Instructions:    I discussed the assessment and treatment plan with the patient. The patient was provided an opportunity to ask questions and all were answered. The patient agreed with the plan and demonstrated an understanding of the instructions.   The patient was advised to call back or seek an in-person evaluation if the symptoms worsen or if the condition fails to improve as anticipated.  I provided *** minutes of non-face-to-face time during this encounter.   Martyn Ehrich, NP

## 2018-10-11 NOTE — Progress Notes (Signed)
Virtual Visit via Telephone Note  I connected with Todd Lloyd on 10/11/18 at  4:00 PM EDT by telephone and verified that I am speaking with the correct person using two identifiers.  Location: Patient: Home Provider: Office   I discussed the limitations, risks, security and privacy concerns of performing an evaluation and management service by telephone and the availability of in person appointments. I also discussed with the patient that there may be a patient responsible charge related to this service. The patient expressed understanding and agreed to proceed.  History of Present Illness: 78 year old male, former smoker. PMH afib, COPD, RLS. Patient Dr. Lamonte Sakai, last seen 12/25/17. CT chest showed stable nodular disease and smoking is remote, he is considered low risk. Maintained on Stiolto 2 puffs daily, prn albuterol.   09/27/2018 Patient called today for acute visit with complaints of increased labored breathing over the last couple of weeks. States that he is sob with slight activity. Associated wheezing. Continues Stiolto as prescribed, using Albuterol rescue inhaler more. No significant cough, URI or allergy symptoms. Declined nebulizer machine. No sick contact. Afebrile.   10/11/2018 Patient called today for 2 week follow-up. Reports no improvement with addition of trelegy or from steroid taper. Still has a lot of wheezing for the last 2-3 months. No cough. Reports increase in abdominal weight. Denies leg swelling or GERD symptoms. Recently had labs done; Eos absolute 100, WBC normal, CMET ok.   Observations/Objective:  - No audible shortness of breath, wheezing or cough noted   Assessment and Plan:  COPD - Complains of sob/wheezing x 2-3 months. No cough.  - No improvement with Trelegy or prednisone taper - Checking CXR and BNP - Advised patient to finish Trelegy sample and then return to Stiolto   GERD - No active GERD symptoms - Continue Omeprazole 20mg  daily   Follow  Up Instructions:  2-4 week fu with NP or Dr. Lamonte Sakai   I discussed the assessment and treatment plan with the patient. The patient was provided an opportunity to ask questions and all were answered. The patient agreed with the plan and demonstrated an understanding of the instructions.   The patient was advised to call back or seek an in-person evaluation if the symptoms worsen or if the condition fails to improve as anticipated.  I provided 22 minutes of non-face-to-face time during this encounter.   Martyn Ehrich, NP

## 2018-10-11 NOTE — Patient Instructions (Signed)
Orders: CXR and labs (Tuesday 5/26)  You can finish up the Trelegy and then return to using Stiolto if you didn't notice any improvement in your breathing  Continue omeprazole 20mg  daily  May consider ENT referral- will discuss with Dr. Lamonte Sakai   Follow up in 2-4 weeks with NP or Dr. Lamonte Sakai next available   If symptoms worsen present to ED

## 2018-10-16 ENCOUNTER — Telehealth: Payer: Self-pay | Admitting: Cardiology

## 2018-10-16 NOTE — Telephone Encounter (Signed)
Will close encounter as it was listed as an error and no content in message.

## 2018-10-16 NOTE — Telephone Encounter (Signed)
Phone visit/call home #/no my chart, sent text to set up/pre reg complete/consent obtained -- ttf

## 2018-10-21 ENCOUNTER — Telehealth: Payer: Self-pay

## 2018-10-21 ENCOUNTER — Telehealth (INDEPENDENT_AMBULATORY_CARE_PROVIDER_SITE_OTHER): Payer: Medicare Other | Admitting: Cardiology

## 2018-10-21 ENCOUNTER — Encounter: Payer: Self-pay | Admitting: Cardiology

## 2018-10-21 VITALS — BP 142/80 | HR 83 | Temp 97.6°F | Ht 72.0 in | Wt 180.0 lb

## 2018-10-21 DIAGNOSIS — J449 Chronic obstructive pulmonary disease, unspecified: Secondary | ICD-10-CM

## 2018-10-21 DIAGNOSIS — Z7901 Long term (current) use of anticoagulants: Secondary | ICD-10-CM

## 2018-10-21 DIAGNOSIS — I4891 Unspecified atrial fibrillation: Secondary | ICD-10-CM | POA: Diagnosis not present

## 2018-10-21 DIAGNOSIS — I10 Essential (primary) hypertension: Secondary | ICD-10-CM | POA: Insufficient documentation

## 2018-10-21 DIAGNOSIS — I77810 Thoracic aortic ectasia: Secondary | ICD-10-CM

## 2018-10-21 MED ORDER — LOSARTAN POTASSIUM 50 MG PO TABS: 50.0000 mg | ORAL_TABLET | Freq: Every day | ORAL | 1 refills | Status: DC
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
## 2018-10-21 NOTE — Patient Instructions (Signed)
Medication Instructions:  INCREASE Losartan to 50mg  once a day If you need a refill on your cardiac medications before your next appointment, please call your pharmacy.   Lab work: None  If you have labs (blood work) drawn today and your tests are completely normal, you will receive your results only by: Marland Kitchen MyChart Message (if you have MyChart) OR . A paper copy in the mail If you have any lab test that is abnormal or we need to change your treatment, we will call you to review the results.  Testing/Procedures: A scheduler will be in contact with you to schedule your ECHO  Follow-Up: At [facility], you and your health needs are our priority.  As part of our continuing mission to provide you with exceptional heart care, we have created designated Provider Care Teams.  These Care Teams include your primary Cardiologist (physician) and Advanced Practice Providers (APPs -  Physician Assistants and Nurse Practitioners) who all work together to provide you with the care you need, when you need it.  . Your physician recommends that you schedule a follow-up appointment in: 2-3 weeks with Kerin Ransom, PA-C  Any Other Special Instructions Will Be Listed Below (If Applicable).

## 2018-10-21 NOTE — Telephone Encounter (Signed)
Contacted patient to discuss AVS instructions. Patient agreeable with Luke's recommendations and voiced understanding. AVS will be mailed to patient by medical records.

## 2018-10-21 NOTE — Telephone Encounter (Signed)
VERBAL CONSENT GIVEN ON 10/16/2018 TO Todd Lloyd AT 12:46PM.      Virtual Visit Pre-Appointment Phone Call  "[name], I am calling you today to discuss your upcoming appointment. We are currently trying to limit exposure to the virus that causes COVID-19 by seeing patients at home rather than in the office."  1. "What is the BEST phone number to call the day of the visit?" - include this in appointment notes  2. "Do you have or have access to (through a family member/friend) a smartphone with video capability that we can use for your visit?" a. If yes - list this number in appt notes as "cell" (if different from BEST phone #) and list the appointment type as a VIDEO visit in appointment notes b. If no - list the appointment type as a PHONE visit in appointment notes  3. Confirm consent - "In the setting of the current Covid19 crisis, you are scheduled for a (phone or video) visit with your provider on (date) at (time).  Just as we do with many in-office visits, in order for you to participate in this visit, we must obtain consent.  If you'd like, I can send this to your mychart (if signed up) or email for you to review.  Otherwise, I can obtain your verbal consent now.  All virtual visits are billed to your insurance company just like a normal visit would be.  By agreeing to a virtual visit, we'd like you to understand that the technology does not allow for your provider to perform an examination, and thus may limit your provider's ability to fully assess your condition. If your provider identifies any concerns that need to be evaluated in person, we will make arrangements to do so.  Finally, though the technology is pretty good, we cannot assure that it will always work on either your or our end, and in the setting of a video visit, we may have to convert it to a phone-only visit.  In either situation, we cannot ensure that we have a secure connection.  Are you willing to proceed?" STAFF: Did  the patient verbally acknowledge consent to telehealth visit? Document YES/NO here: YES  4. Advise patient to be prepared - "Two hours prior to your appointment, go ahead and check your blood pressure, pulse, oxygen saturation, and your weight (if you have the equipment to check those) and write them all down. When your visit starts, your provider will ask you for this information. If you have an Apple Watch or Kardia device, please plan to have heart rate information ready on the day of your appointment. Please have a pen and paper handy nearby the day of the visit as well."  5. Give patient instructions for MyChart download to smartphone OR Doximity/[email or web address] as below if video visit (depending on what platform provider is using)  6. Inform patient they will receive a phone call 15 minutes prior to their appointment time (may be from unknown caller ID) so they should be prepared to answer    TELEPHONE CALL NOTE  Todd Lloyd has been deemed a candidate for a follow-up tele-health visit to limit community exposure during the Covid-19 pandemic. I spoke with the patient via phone to ensure availability of phone/video source, confirm preferred email & phone number, and discuss instructions and expectations.  I reminded Todd Lloyd to be prepared with any vital sign and/or heart rhythm information that could potentially be obtained via home monitoring, at the time of  his visit. I reminded Todd Lloyd to expect a phone call prior to his visit.  Harold Hedge, CMA 10/21/2018 8:43 AM   INSTRUCTIONS FOR DOWNLOADING THE MYCHART APP TO SMARTPHONE  - The patient must first make sure to have activated MyChart and know their login information - If Apple, go to [employer] and type in MyChart in the search bar and download the app. If Android, ask patient to go to [employer] and type in [place] in the search bar and download the app. The app is free but as with any other app  downloads, their phone may require them to verify saved payment information or Apple/Android password.  - The patient will need to then log into the app with their MyChart username and password, and select [facility] as their healthcare provider to link the account. When it is time for your visit, go to the MyChart app, find appointments, and click Begin Video Visit. Be sure to Select Allow for your device to access the Microphone and Camera for your visit. You will then be connected, and your provider will be with you shortly.  **If they have any issues connecting, or need assistance please contact MyChart service desk [phone])**  **If using a computer, in order to ensure the best quality for their visit they will need to use either of the following Internet Browsers: [employer], or Google Chrome**  IF USING DOXIMITY or DOXY.ME - The patient will receive a link just prior to their visit by text.     FULL LENGTH CONSENT FOR TELE-HEALTH VISIT   I hereby voluntarily request, consent and authorize [facility] and its employed or contracted physicians, physician assistants, nurse practitioners or other licensed health care professionals (the Practitioner), to provide me with telemedicine health care services (the "Services") as deemed necessary by the treating Practitioner. I acknowledge and consent to receive the Services by the Practitioner via telemedicine. I understand that the telemedicine visit will involve communicating with the Practitioner through live audiovisual communication technology and the disclosure of certain medical information by electronic transmission. I acknowledge that I have been given the opportunity to request an in-person assessment or other available alternative prior to the telemedicine visit and am voluntarily participating in the telemedicine visit.  I understand that I have the right to withhold or withdraw my consent to the use of telemedicine  in the course of my care at any time, without affecting my right to future care or treatment, and that the Practitioner or I may terminate the telemedicine visit at any time. I understand that I have the right to inspect all information obtained and/or recorded in the course of the telemedicine visit and may receive copies of available information for a reasonable fee.  I understand that some of the potential risks of receiving the Services via telemedicine include:  Marland Kitchen Delay or interruption in medical evaluation due to technological equipment failure or disruption; . Information transmitted may not be sufficient (e.g. poor resolution of images) to allow for appropriate medical decision making by the Practitioner; and/or  . In rare instances, security protocols could fail, causing a breach of personal health information.  Furthermore, I acknowledge that it is my responsibility to provide information about my medical history, conditions and care that is complete and accurate to the best of my ability. I acknowledge that Practitioner's advice, recommendations, and/or decision may be based on factors not within their control, such as incomplete or inaccurate data provided by  me or distortions of diagnostic images or specimens that may result from electronic transmissions. I understand that the practice of medicine is not an exact science and that Practitioner makes no warranties or guarantees regarding treatment outcomes. I acknowledge that I will receive a copy of this consent concurrently upon execution via email to the email address I last provided but may also request a printed copy by calling the office of [facility].    I understand that my insurance will be billed for this visit.   I have read or had this consent read to me. . I understand the contents of this consent, which adequately explains the benefits and risks of the Services being provided via telemedicine.  . I have been provided ample  opportunity to ask questions regarding this consent and the Services and have had my questions answered to my satisfaction. . I give my informed consent for the services to be provided through the use of telemedicine in my medical care  By participating in this telemedicine visit I agree to the above.

## 2018-10-28 ENCOUNTER — Other Ambulatory Visit: Payer: Self-pay

## 2018-10-28 ENCOUNTER — Ambulatory Visit (INDEPENDENT_AMBULATORY_CARE_PROVIDER_SITE_OTHER): Payer: Medicare Other | Admitting: Primary Care

## 2018-10-28 ENCOUNTER — Encounter: Payer: Self-pay | Admitting: Primary Care

## 2018-10-28 ENCOUNTER — Telehealth: Payer: Self-pay | Admitting: Primary Care

## 2018-10-28 DIAGNOSIS — R0609 Other forms of dyspnea: Secondary | ICD-10-CM | POA: Diagnosis not present

## 2018-10-28 MED ORDER — MOMETASONE FUROATE 100 MCG/ACT IN AERO: 2.0000 | INHALATION_SPRAY | Freq: Two times a day (BID) | RESPIRATORY_TRACT | 0 refills | Status: DC

## 2018-10-28 NOTE — Patient Instructions (Addendum)
Orders: CXR Tuesday Labs (BNP and d-dimer)   Continue Stiolto daily  Trial Asmanex 2 puffs twice daily   Follow-up After testing results or 2 weeks with Dr. Lamonte Sakai

## 2018-10-28 NOTE — Telephone Encounter (Signed)
Called and spoke to patient. Patient reports increased shortness of breath. Patient stated that he had a televisit with NP in May but did not come in for chest xray. Patient reports he is not feeling better and would like another televisit. Scheduled televisit with Derl Barrow, NP at 2:30pm today.  Nothing further needed at this time.

## 2018-10-28 NOTE — Progress Notes (Signed)
Virtual Visit via Telephone Note  I connected with Todd Lloyd on 10/28/18 at  2:30 PM EDT by telephone and verified that I am speaking with the correct person using two identifiers.  Location: Patient: Home Provider: Office   I discussed the limitations, risks, security and privacy concerns of performing an evaluation and management service by telephone and the availability of in person appointments. I also discussed with the patient that there may be a patient responsible charge related to this service. The patient expressed understanding and agreed to proceed.   History of Present Illness: 78 year old male, former smoker. PMH afib, COPD, RLS. Patient Dr. Lamonte Sakai, last seen 12/25/17. CT chest showed stable nodular disease and smoking is remote, he is considered low risk. Maintained on Stiolto 2 puffs daily, prn albuterol.   Previous [place] encounters: 09/27/2018 Patient called today for acute visit with complaints of increased labored breathing over the last couple of weeks. States that he is sob with slight activity. Associated wheezing. Continues Stiolto as prescribed, using Albuterol rescue inhaler more. No significant cough, URI or allergy symptoms. Declined nebulizer machine. No sick contact. Afebrile.   10/11/2018 Patient called today for 2 week follow-up. Reports no improvement with addition of trelegy or from steroid taper. Still has a lot of wheezing for the last 2-3 months. No cough. Reports increase in abdominal weight. Denies leg swelling or GERD symptoms. Recently had labs done; Eos absolute 100, WBC normal, CMET ok.    10/28/2018 Patient called today for tele-visit.  Reports continued SOB and wheezing since last visit. He did not have CXR or BNP as previously ordered.  He has had no improvement with prednisone course or trial trelegy. Continues taking Stiolto 2 puffs daily. Denies fever, cough, flu-like symptoms or sick contact.  Observations/Objective:  -No significant  shortness of breath, wheezing or cough noted during phone conversation  Assessment and Plan:  COPD: - Reports moderate dyspnea and wheezing, no improvement with oral steriods - Continue Stiolto respimat (LABA/LAMA)- 2 puffs once daily - Trial Asmanex hfa (ICS)- 2 puffs twice daily - Needs CXR and BNP/D-dimer (patient will come in tomorrow 6/9 to have completed) - Low suspicion covid   Follow Up Instructions:  - Will follow up with patient after testing is resulted  - 2 weeks with Dr. Lamonte Sakai    I discussed the assessment and treatment plan with the patient. The patient was provided an opportunity to ask questions and all were answered. The patient agreed with the plan and demonstrated an understanding of the instructions.   The patient was advised to call back or seek an in-person evaluation if the symptoms worsen or if the condition fails to improve as anticipated.  I provided 25 minutes of non-face-to-face time during this encounter.   Martyn Ehrich, NP

## 2018-10-29 ENCOUNTER — Ambulatory Visit (INDEPENDENT_AMBULATORY_CARE_PROVIDER_SITE_OTHER): Payer: Medicare Other

## 2018-10-29 DIAGNOSIS — R0609 Other forms of dyspnea: Secondary | ICD-10-CM

## 2018-10-30 ENCOUNTER — Other Ambulatory Visit (INDEPENDENT_AMBULATORY_CARE_PROVIDER_SITE_OTHER): Payer: Medicare Other

## 2018-10-30 DIAGNOSIS — R0609 Other forms of dyspnea: Secondary | ICD-10-CM | POA: Diagnosis not present

## 2018-10-30 LAB — D-DIMER, QUANTITATIVE: D-Dimer, Quant: 0.23 mcg/mL FEU (ref <0.50)

## 2018-10-30 LAB — BRAIN NATRIURETIC PEPTIDE: Pro B Natriuretic peptide (BNP): 177 pg/mL — ABNORMAL HIGH (ref 0.0–100.0)

## 2018-10-30 MED ORDER — FUROSEMIDE 20 MG PO TABS: 20.0000 mg | ORAL_TABLET | Freq: Every day | ORAL | 0 refills | Status: DC

## 2018-10-30 NOTE — Addendum Note (Signed)
Addended by: Karmen Stabs on: 10/30/2018 05:02 PM   Modules accepted: Orders

## 2018-10-30 NOTE — Progress Notes (Signed)
BNP was elevated meaning he could be holding on to some extra fluid causing his shortness of breath. Will send in a diuretic for a couple of days. Needs to follow up with cardiology, recommend he has a echocardiogram that was ordered back in April 2020.

## 2018-11-04 ENCOUNTER — Telehealth (INDEPENDENT_AMBULATORY_CARE_PROVIDER_SITE_OTHER): Payer: Medicare Other | Admitting: Cardiology

## 2018-11-04 ENCOUNTER — Telehealth: Payer: Self-pay

## 2018-11-04 ENCOUNTER — Telehealth (HOSPITAL_COMMUNITY): Payer: Self-pay | Admitting: *Deleted

## 2018-11-04 ENCOUNTER — Encounter: Payer: Self-pay | Admitting: Cardiology

## 2018-11-04 VITALS — BP 137/80 | HR 83 | Temp 96.7°F | Ht 72.0 in | Wt 180.0 lb

## 2018-11-04 DIAGNOSIS — I10 Essential (primary) hypertension: Secondary | ICD-10-CM

## 2018-11-04 DIAGNOSIS — Z7901 Long term (current) use of anticoagulants: Secondary | ICD-10-CM

## 2018-11-04 DIAGNOSIS — R0609 Other forms of dyspnea: Secondary | ICD-10-CM | POA: Diagnosis not present

## 2018-11-04 DIAGNOSIS — R0600 Dyspnea, unspecified: Secondary | ICD-10-CM

## 2018-11-04 DIAGNOSIS — J449 Chronic obstructive pulmonary disease, unspecified: Secondary | ICD-10-CM

## 2018-11-04 NOTE — Patient Instructions (Signed)
Medication Instructions:  Your physician recommends that you continue on your current medications as directed. Please refer to the Current Medication list given to you today. If you need a refill on your cardiac medications before your next appointment, please call your pharmacy.   Lab work: None  If you have labs (blood work) drawn today and your tests are completely normal, you will receive your results only by: Marland Kitchen MyChart Message (if you have MyChart) OR . A paper copy in the mail If you have any lab test that is abnormal or we need to change your treatment, we will call you to review the results.  Testing/Procedures: none  Follow-Up: At [facility], you and your health needs are our priority.  As part of our continuing mission to provide you with exceptional heart care, we have created designated Provider Care Teams.  These Care Teams include your primary Cardiologist (physician) and Advanced Practice Providers (APPs -  Physician Assistants and Nurse Practitioners) who all work together to provide you with the care you need, when you need it. You will need a follow up appointment in 6 months.  Please call our office 2 months in advance to schedule this appointment.  You may see Skeet Latch, MD or one of the following Advanced Practice Providers on your designated Care Team:   Kerin Ransom, PA-C Roby Lofts, [place] . Sande Rives, PA-C  Any Other Special Instructions Will Be Listed Below (If Applicable).

## 2018-11-04 NOTE — Telephone Encounter (Signed)
COVID-19 Pre-Screening Questions:  . Do you currently have a fever? NO (yes = cancel and refer to pcp for e-visit) . Have you recently travelled on a cruise, internationally, or to [place], [place], [place], [place], [place], or [place], [place] [employer]) ? NO (yes = cancel, stay home, monitor symptoms, and contact pcp or initiate e-visit if symptoms develop) . Have you been in contact with someone that is currently pending confirmation of Covid19 testing or has been confirmed to have the [place] virus?  NO (yes = cancel, stay home, away from tested individual, monitor symptoms, and contact pcp or initiate e-visit if symptoms develop) . Are you currently experiencing fatigue or cough? No (yes = pt should be prepared to have a mask placed at the time of their visit).   . Reiterated no additional visitors. Eartha Inch no earlier than 15 minutes before appointment time. . Please bring own mask.
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
## 2018-11-04 NOTE — Telephone Encounter (Signed)
Contacted patient to discuss AVS Instructions. Gave him Luke's recommendations from today's office visit. Informed patient that someone from the scheduling dept will be contacting him to schedule his follow up appt. He voiced understanding and AVS mailed to patient.    

## 2018-11-05 ENCOUNTER — Ambulatory Visit (HOSPITAL_COMMUNITY): Payer: Medicare Other | Attending: Internal Medicine

## 2018-11-05 ENCOUNTER — Other Ambulatory Visit: Payer: Self-pay

## 2018-11-05 DIAGNOSIS — I7121 Aneurysm of the ascending aorta, without rupture: Secondary | ICD-10-CM

## 2018-11-05 DIAGNOSIS — R0602 Shortness of breath: Secondary | ICD-10-CM | POA: Insufficient documentation

## 2018-11-05 DIAGNOSIS — M542 Cervicalgia: Secondary | ICD-10-CM | POA: Diagnosis not present

## 2018-11-05 DIAGNOSIS — M4802 Spinal stenosis, cervical region: Secondary | ICD-10-CM | POA: Diagnosis not present

## 2018-11-05 DIAGNOSIS — I712 Thoracic aortic aneurysm, without rupture: Secondary | ICD-10-CM | POA: Insufficient documentation

## 2018-11-11 ENCOUNTER — Telehealth: Payer: Self-pay | Admitting: Primary Care

## 2018-11-11 NOTE — Telephone Encounter (Signed)
Primary Pulmonologist: RB Last office visit and with whom: 10/28/2018 with Derl Barrow What do we see them for (pulmonary problems): DOE/ COPD Last OV assessment/plan:  Assessment and Plan:  COPD: - Reports moderate dyspnea and wheezing, no improvement with oral steriods - Continue Stiolto respimat (LABA/LAMA)- 2 puffs once daily - Trial Asmanex hfa (ICS)- 2 puffs twice daily - Needs CXR and BNP/D-dimer (patient will come in tomorrow 6/9 to have completed) - Low suspicion covid   Follow Up Instructions:  - Will follow up with patient after testing is resulted  - 2 weeks with Dr. Lamonte Sakai   I discussed the assessment and treatment plan with the patient. The patient was provided an opportunity to ask questions and all were answered. The patient agreed with the plan and demonstrated an understanding of the instructions.  The patient was advised to call back or seek an in-person evaluation if the symptoms worsen or if the condition fails to improve as anticipated.  I provided 25 minutes of non-face-to-face time during this encounter.   Martyn Ehrich, NP       Patient Instructions by Martyn Ehrich, NP at 10/28/2018 2:30 PM Author: Martyn Ehrich, NP Author Type: Nurse Practitioner Filed: 10/28/2018 5:49 PM  Note Status: Addendum Cosign: Cosign Not Required Encounter Date: 10/28/2018  Editor: Martyn Ehrich, NP (Nurse Practitioner)  Prior Versions: 1. Martyn Ehrich, NP (Nurse Practitioner) at 10/28/2018 5:48 PM - Addendum   2. Martyn Ehrich, NP (Nurse Practitioner) at 10/28/2018 3:24 PM - Addendum   3. Martyn Ehrich, NP (Nurse Practitioner) at 10/28/2018 3:15 PM - Addendum   4. Martyn Ehrich, NP (Nurse Practitioner) at 10/28/2018 3:11 PM - Signed    Orders: CXR Tuesday Labs (BNP and d-dimer)   Continue Stiolto daily  Trial Asmanex 2 puffs twice daily   Follow-up After testing results or 2 weeks with Dr. Lamonte Sakai      Was appointment offered  to patient (explain)?  Pt wants recommendations   Reason for call: Pt had Rx for lasix sent to pharmacy 10/30/2018 for 20mg  tablet with a quantity of 7 tablets.  Called and spoke with pt who stated he took the lasix Rx that had been prescribed for him to take x7 days. Pt stated while he was on the lasix, the wheezing had stopped but pt stated he was still breathing some heavy.  Pt stated the asmanex has helped some.  Pt stated with all the meds he is on, he is still having problems with breathing and is wanting to know what we could recommend to help with his symptoms. Pt did have echo performed 6/16 after seeing cardiology.  Dr. Lamonte Sakai, please advise on this for pt. Thanks!

## 2018-11-12 NOTE — Telephone Encounter (Signed)
I believe that Todd Lloyd needs to set up an OV with Dr Oval Linsey his Cardiologist since he benefited from the lasix. He may need to have meds adjusted by her. He al;so needs an OV with me to follow up in person.

## 2018-11-13 NOTE — Telephone Encounter (Signed)
Called and spoke with pt letting him know that [place] said he needed to contact cardiologist due to possibly needing meds adjusted since he did benefit from the lasix he was on. Also stated to pt that [place] said we needed to schedule OV for him to see RB to reassess breathing and pt verbalized understanding. Pt has been scheduled OV with RB Friday 6/26 at 11:45. Nothing further needed.

## 2018-11-15 ENCOUNTER — Ambulatory Visit (INDEPENDENT_AMBULATORY_CARE_PROVIDER_SITE_OTHER): Payer: Medicare Other | Admitting: Emergency Medicine

## 2018-11-15 ENCOUNTER — Other Ambulatory Visit: Payer: Self-pay

## 2018-11-15 ENCOUNTER — Encounter: Payer: Self-pay | Admitting: Emergency Medicine

## 2018-11-15 ENCOUNTER — Telehealth: Payer: Self-pay | Admitting: Emergency Medicine

## 2018-11-15 VITALS — BP 136/80 | HR 101 | Temp 97.7°F | Ht 72.0 in | Wt 200.0 lb

## 2018-11-15 DIAGNOSIS — J449 Chronic obstructive pulmonary disease, unspecified: Secondary | ICD-10-CM

## 2018-11-15 DIAGNOSIS — J441 Chronic obstructive pulmonary disease with (acute) exacerbation: Secondary | ICD-10-CM | POA: Diagnosis not present

## 2018-11-15 DIAGNOSIS — R0602 Shortness of breath: Secondary | ICD-10-CM

## 2018-11-15 DIAGNOSIS — R0609 Other forms of dyspnea: Secondary | ICD-10-CM | POA: Diagnosis not present

## 2018-11-15 MED ORDER — ALBUTEROL SULFATE (2.5 MG/3ML) 0.083% IN NEBU: 2.5000 mg | INHALATION_SOLUTION | Freq: Four times a day (QID) | RESPIRATORY_TRACT | 11 refills | Status: DC | PRN

## 2018-11-15 NOTE — Telephone Encounter (Signed)
Order has been updated. Nothing further was needed.

## 2018-11-15 NOTE — Assessment & Plan Note (Signed)
Currently on Stiolto, did not benefit from Trelegy.  Interestingly he did not really seem to benefit recently from a prednisone taper either.  We will assess for occult desaturation as above.  Ongoing and nebulized albuterol to see if he gets benefit from this.  If so may be there is an issue with adequate bronchodilator delivery.  Consider changing him over to nebs altogether.

## 2018-11-15 NOTE — Assessment & Plan Note (Signed)
Progressive dyspnea.  Difficult to tease the symptoms out.  He was given empiric Lasix earlier this month and said it may have made his wheezing better but did not change his overall breathing.  Not clear to me that this is volume overload.  Certainly he is at risk for progression of his COPD.  Also at risk for hypoxemia.  We will walk him today to ensure no occult desaturation.  Question whether he is getting adequate delivery of his bronchodilators.  I will continue his Stiolto for now, add albuterol nebs to see if he notices that these are helpful.  If so then I will consider changing his Stiolto over to nebulized regimen.  He is also concerned that there could be interstitial disease or parenchymal abnormality, also worried about a family history of lung cancer and whether he may have lung cancer.  I think it is reasonable to obtain a CT chest to evaluate further.  We will order this, review next time.  Please continue Stiolto 2 puffs once daily as you have been taking it. Keep your albuterol inhaler available use 2 puffs up to every 4 hours if needed for shortness of breath, chest tightness, wheezing. We will add albuterol through the nebulizer that you can use if needed for shortness of breath, chest tightness, wheezing.  Keep track of whether you feel like this form of the medication helps you more than the simple inhaler. Walking oximetry on room air today.  We will perform a CT scan of the chest Continue your cardiac medications as recommended by Dr Oval Linsey Follow with Dr Lamonte Sakai in 1 month to assess your response to the nebulized medication and to review the CT chest

## 2018-11-15 NOTE — Patient Instructions (Addendum)
Please continue Stiolto 2 puffs once daily as you have been taking it. Keep your albuterol inhaler available use 2 puffs up to every 4 hours if needed for shortness of breath, chest tightness, wheezing. We will add albuterol through the nebulizer that you can use if needed for shortness of breath, chest tightness, wheezing.  Keep track of whether you feel like this form of the medication helps you more than the simple inhaler. Walking oximetry on room air today.  We will perform a CT scan of the chest Continue your cardiac medications as recommended by Dr Oval Linsey Follow with Dr Lamonte Sakai in 1 month to assess your response to the nebulized medication and to review the CT chest

## 2018-11-15 NOTE — Progress Notes (Signed)
Subjective:    Patient ID: Todd Lloyd, male    DOB: January 02, 1941, 78 y.o.   MRN: [id number]  Shortness of Breath Pertinent negatives include no ear pain, fever, headaches, leg swelling, rash, rhinorrhea, sore throat, vomiting or wheezing.   Acute OV 11/15/17 --Todd Lloyd presents today for an acute office visit.  I followed him for severe obstructive lung disease, last seen about 2 years ago.  He is been managed with Anoro. He reports today that his breathing has been worse for the last 6 months. He can exert for 5 minutes and then has to stop. He is using his albuterol 3-4 x a day. Occasional wheeze.  FEV1 2016 44% predicted. No CP. Follow annually w Dr Wynonia Lawman.  Missed his LD-CT in December, wants to get it done.   ROV 12/25/17 --follow-up visit today for 78 year old gentleman with severe obstructive lung disease. I saw him just over 1 month ago.  At his visit I changed his Anoro to Stiolto to see if he'd get more benefit. He believes that it has helped him some, although he is still experiencing some exertional dyspnea. He very rarely uses his albuterol. He hasn't restarted LDCT program.   ROV 11/15/2018 --78 year old man whom I have followed for severe COPD.  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  He is followed also at the [place].  He has been managed on Stiolto.  Recently over the last 3 months he has had progressive dyspnea.  In mid May he was treated with a prednisone taper, changed from [place] to Trelegy to see if you get more benefit.  Part of his evaluation included a BNP which was elevated at 177 on 10/30/2018.  He received several days of Lasix with some improvement in his dyspnea.  He returns today reporting that his exertional tolerance is still poor, having trouble walking to mailbox. 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 He is hearing a lot of wheeze, sounds like it  is throat noise. He doesn't believe the Trelegy helped him, or the prednisone.    TTE report 6/16 reviewed, shows intact LV fxn, some LVH, enlarged RA CXR 6/9 reviewed, no infiltrates.     Review of Systems  Constitutional: Negative for fever and unexpected weight change.  HENT: Negative for congestion, dental problem, ear pain, nosebleeds, postnasal drip, rhinorrhea, sinus pressure, sneezing, sore throat and trouble swallowing.   Eyes: Negative for redness and itching.  Respiratory: Positive for shortness of breath. Negative for cough, chest tightness and wheezing.   Cardiovascular: Negative for palpitations and leg swelling.  Gastrointestinal: Negative for nausea and vomiting.  Genitourinary: Negative for dysuria.  Musculoskeletal: Negative for joint swelling.  Skin: Negative for rash.  Neurological: Negative for headaches.  Hematological: Does not bruise/bleed easily.  Psychiatric/Behavioral: Negative for dysphoric mood. The patient is not nervous/anxious.    Past Medical History:  Diagnosis Date  . Acid reflux   . Atrial fibrillation ([place])   . Chronic low back pain 07/16/2018  . Colon polyps   . COPD (chronic obstructive pulmonary disease) ([place])   . Gait abnormality 09/12/2016  . High cholesterol   . Leukemia ([place])    s/p bone marrow transplant in 1990  . Memory difficulties 09/12/2016  . Peripheral neuropathy 10/18/2016  . RLS (restless legs syndrome) 10/18/2016     Family History  Problem Relation Age of Onset  .  Colon cancer Father   . Heart disease Father   . Leukemia Maternal Uncle   . Melanoma Mother   . Dementia Mother   . ALS Sister   . Lung cancer Brother      Social History   Socioeconomic History  . Marital status: Married    Spouse name: Hoyle Sauer  . Number of children: 0  . Years of education: 50  . Highest education level: Not on file  Occupational History  . Occupation: Retired    Comment: Chartered loss adjuster  Social Needs  . Financial  resource strain: Not on file  . Food insecurity    Worry: Not on file    Inability: Not on file  . Transportation needs    Medical: Not on file    Non-medical: Not on file  Tobacco Use  . Smoking status: Former Smoker    Packs/day: 2.00    Years: 43.00    Pack years: 86.00    Types: Cigarettes    Quit date: 05/22/2000    Years since quitting: 18.4  . Smokeless tobacco: Never Used  . Tobacco comment: Counseled to remain smoke free  Substance and Sexual Activity  . Alcohol use: No    Alcohol/week: 0.0 standard drinks  . Drug use: No  . Sexual activity: Not on file  Lifestyle  . Physical activity    Days per week: Not on file    Minutes per session: Not on file  . Stress: Not on file  Relationships  . Social Herbalist on phone: Not on file    Gets together: Not on file    Attends religious service: Not on file    Active member of club or organization: Not on file    Attends meetings of clubs or organizations: Not on file    Relationship status: Not on file  . Intimate partner violence    Fear of current or ex partner: Not on file    Emotionally abused: Not on file    Physically abused: Not on file    Forced sexual activity: Not on file  Other Topics Concern  . 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 Lyrica [Pregabalin]     confusion  . Morphine And Related     confusion     Outpatient Medications Prior to Visit  Medication Sig Dispense Refill  . albuterol (PROVENTIL HFA;VENTOLIN HFA) 108 (90 BASE) MCG/ACT inhaler Inhale 2 puffs into the lungs every 6 (six) hours as needed. For shortness of breath    . digoxin (LANOXIN) 0.25 MG tablet Take 250 mcg by mouth daily.    Marland Kitchen donepezil (ARICEPT) 10 MG tablet Take 10 mg by mouth at bedtime.    Marland Kitchen  ezetimibe (ZETIA) 10 MG tablet Take 1 tablet by mouth daily.    . furosemide (LASIX) 20 MG tablet Take 1 tablet (20 mg total) by mouth daily. 7 tablet 0  . gabapentin (NEURONTIN) 300 MG capsule Take 1 capsule (300 mg total) by mouth 2 (two) times daily. 60 capsule 2  . losartan (COZAAR) 25 MG tablet Take 25 mg by mouth 2 (two) times a day.    . Mometasone Furoate (ASMANEX HFA) 100 MCG/ACT AERO Inhale 2 puffs  into the lungs 2 (two) times daily. 13 g 0  . omeprazole (PRILOSEC) 20 MG capsule Take 20 mg by mouth daily.    Marland Kitchen oxyCODONE-acetaminophen (PERCOCET) 10-325 MG tablet Take 1 tablet by mouth 2 (two) times daily.    . pramipexole (MIRAPEX) 1 MG tablet Take 1 mg by mouth 3 (three) times daily.    . rivaroxaban (XARELTO) 20 MG TABS tablet Take 20 mg by mouth daily with supper.    . rosuvastatin (CRESTOR) 10 MG tablet Take 1 tablet (10 mg total) by mouth daily. 30 tablet 5  . Tiotropium Bromide-Olodaterol (STIOLTO RESPIMAT) 2.5-2.5 MCG/ACT AERS Inhale 2 puffs into the lungs daily. 1 Inhaler 5   Facility-Administered Medications Prior to Visit  Medication Dose Route Frequency Provider Last Rate Last Dose  . 0.9 %  sodium chloride infusion  500 mL Intravenous Continuous Milus Banister, MD             Objective:   Physical Exam Vitals:   11/15/18 1140  BP: 136/80  Pulse: (!) 101  Temp: 97.7 F (36.5 C)  TempSrc: Oral  SpO2: 98%  Weight: 200 lb (90.7 kg)  Height: 6' (1.829 m)   Gen: Pleasant, elderly man, in no distress,  normal affect  ENT: No lesions,  mouth clear,  oropharynx clear, no postnasal drip  Neck: No JVD, no stridor  Lungs: No use of accessory muscles, distant, no wheeze   Cardiovascular: RRR, heart sounds normal, no murmur or gallops, no peripheral edema  Musculoskeletal: No deformities, no cyanosis or clubbing  Neuro: alert, non focal  Skin: Warm, no lesions or rashes      Assessment & Plan:  Dyspnea on exertion Progressive dyspnea.  Difficult to tease  the symptoms out.  He was given empiric Lasix earlier this month and said it may have made his wheezing better but did not change his overall breathing.  Not clear to me that this is volume overload.  Certainly he is at risk for progression of his COPD.  Also at risk for hypoxemia.  We will walk him today to ensure no occult desaturation.  Question whether he is getting adequate delivery of his bronchodilators.  I will continue his Stiolto for now, add albuterol nebs to see if he notices that these are helpful.  If so then I will consider changing his Stiolto over to nebulized regimen.  He is also concerned that there could be interstitial disease or parenchymal abnormality, also worried about a family history of lung cancer and whether he may have lung cancer.  I think it is reasonable to obtain a CT chest to evaluate further.  We will order this, review next time.  Please continue Stiolto 2 puffs once daily as you have been taking it. Keep your albuterol inhaler available use 2 puffs up to every 4 hours if needed for shortness of breath, chest tightness, wheezing. We will add albuterol through the nebulizer that you can use if needed for shortness of breath, chest tightness, wheezing.  Keep track of whether you feel like this form of the medication helps you more than the simple inhaler. Walking oximetry on room air today.  We will perform a CT scan of the chest Continue your cardiac medications as recommended by Dr Oval Linsey Follow with Dr Lamonte Sakai in 1 month to assess your response to the nebulized medication and to review the CT chest  COPD (chronic obstructive pulmonary disease) ([place]) Currently on Stiolto, did not benefit from Trelegy.  Interestingly he  did not really seem to benefit recently from a prednisone taper either.  We will assess for occult desaturation as above.  Ongoing and nebulized albuterol to see if he gets benefit from this.  If so may be there is an issue with adequate bronchodilator  delivery.  Consider changing him over to nebs altogether.  [name], MD, PhD 11/15/2018, 12:40 PM [facility] [phone] or if no answer [phone]

## 2018-11-18 ENCOUNTER — Telehealth: Payer: Self-pay | Admitting: Emergency Medicine

## 2018-11-18 DIAGNOSIS — Z125 Encounter for screening for malignant neoplasm of prostate: Secondary | ICD-10-CM | POA: Diagnosis not present

## 2018-11-18 DIAGNOSIS — E7800 Pure hypercholesterolemia, unspecified: Secondary | ICD-10-CM | POA: Diagnosis not present

## 2018-11-18 DIAGNOSIS — I10 Essential (primary) hypertension: Secondary | ICD-10-CM | POA: Diagnosis not present

## 2018-11-18 DIAGNOSIS — E538 Deficiency of other specified B group vitamins: Secondary | ICD-10-CM | POA: Diagnosis not present

## 2018-11-18 MED ORDER — ALBUTEROL SULFATE (2.5 MG/3ML) 0.083% IN NEBU: INHALATION_SOLUTION | RESPIRATORY_TRACT | 11 refills | Status: DC

## 2018-11-18 NOTE — Telephone Encounter (Signed)
Opened in error

## 2018-11-18 NOTE — Telephone Encounter (Signed)
Returned call to [place] at [facility]. Rx for albuterol neb solution must not say prn. Rx resubmitted as every 6 hours and as needed. Nothing further needed.

## 2018-11-19 ENCOUNTER — Telehealth: Payer: Self-pay | Admitting: Emergency Medicine

## 2018-11-19 NOTE — Telephone Encounter (Signed)
Order still pending physician signature. Dr. Lamonte Sakai please sign DME order.

## 2018-11-19 NOTE — Telephone Encounter (Signed)
Done

## 2018-11-20 DIAGNOSIS — I4891 Unspecified atrial fibrillation: Secondary | ICD-10-CM | POA: Diagnosis not present

## 2018-11-20 DIAGNOSIS — Z0000 Encounter for general adult medical examination without abnormal findings: Secondary | ICD-10-CM | POA: Diagnosis not present

## 2018-11-20 DIAGNOSIS — Z1331 Encounter for screening for depression: Secondary | ICD-10-CM | POA: Diagnosis not present

## 2018-11-20 DIAGNOSIS — D692 Other nonthrombocytopenic purpura: Secondary | ICD-10-CM | POA: Diagnosis not present

## 2018-11-20 DIAGNOSIS — F0390 Unspecified dementia without behavioral disturbance: Secondary | ICD-10-CM | POA: Diagnosis not present

## 2018-11-20 DIAGNOSIS — Z79899 Other long term (current) drug therapy: Secondary | ICD-10-CM | POA: Diagnosis not present

## 2018-11-20 DIAGNOSIS — M4692 Unspecified inflammatory spondylopathy, cervical region: Secondary | ICD-10-CM | POA: Diagnosis not present

## 2018-11-20 DIAGNOSIS — Z7901 Long term (current) use of anticoagulants: Secondary | ICD-10-CM | POA: Diagnosis not present

## 2018-11-20 DIAGNOSIS — F332 Major depressive disorder, recurrent severe without psychotic features: Secondary | ICD-10-CM | POA: Diagnosis not present

## 2018-11-20 DIAGNOSIS — R634 Abnormal weight loss: Secondary | ICD-10-CM | POA: Diagnosis not present

## 2018-11-20 DIAGNOSIS — G2581 Restless legs syndrome: Secondary | ICD-10-CM | POA: Diagnosis not present

## 2018-11-20 DIAGNOSIS — I10 Essential (primary) hypertension: Secondary | ICD-10-CM | POA: Diagnosis not present

## 2018-11-20 DIAGNOSIS — R82998 Other abnormal findings in urine: Secondary | ICD-10-CM | POA: Diagnosis not present

## 2018-11-20 DIAGNOSIS — J449 Chronic obstructive pulmonary disease, unspecified: Secondary | ICD-10-CM | POA: Diagnosis not present

## 2018-11-20 DIAGNOSIS — N3281 Overactive bladder: Secondary | ICD-10-CM | POA: Diagnosis not present

## 2018-11-25 ENCOUNTER — Other Ambulatory Visit: Payer: Self-pay | Admitting: *Deleted

## 2018-11-25 DIAGNOSIS — I77810 Thoracic aortic ectasia: Secondary | ICD-10-CM

## 2018-12-05 ENCOUNTER — Ambulatory Visit: Payer: Medicare Other | Admitting: Adult Health

## 2018-12-12 ENCOUNTER — Inpatient Hospital Stay: Admission: RE | Admit: 2018-12-12 | Payer: Medicare Other | Source: Ambulatory Visit

## 2018-12-12 ENCOUNTER — Telehealth: Payer: Self-pay | Admitting: *Deleted

## 2018-12-12 NOTE — Telephone Encounter (Signed)

## 2018-12-13 ENCOUNTER — Ambulatory Visit (INDEPENDENT_AMBULATORY_CARE_PROVIDER_SITE_OTHER)
Admission: RE | Admit: 2018-12-13 | Discharge: 2018-12-13 | Disposition: A | Discharge status: Home / Self Care | Payer: Medicare Other | Source: Ambulatory Visit | Attending: Emergency Medicine | Admitting: Emergency Medicine

## 2018-12-13 ENCOUNTER — Other Ambulatory Visit: Payer: Self-pay

## 2018-12-13 DIAGNOSIS — R0602 Shortness of breath: Secondary | ICD-10-CM

## 2018-12-16 ENCOUNTER — Ambulatory Visit (INDEPENDENT_AMBULATORY_CARE_PROVIDER_SITE_OTHER): Payer: Medicare Other | Admitting: Emergency Medicine

## 2018-12-16 ENCOUNTER — Telehealth: Payer: Self-pay | Admitting: Emergency Medicine

## 2018-12-16 ENCOUNTER — Encounter: Payer: Self-pay | Admitting: Emergency Medicine

## 2018-12-16 ENCOUNTER — Other Ambulatory Visit: Payer: Self-pay

## 2018-12-16 VITALS — BP 122/72 | HR 70 | Ht 72.0 in | Wt 201.0 lb

## 2018-12-16 DIAGNOSIS — R911 Solitary pulmonary nodule: Secondary | ICD-10-CM | POA: Insufficient documentation

## 2018-12-16 DIAGNOSIS — J449 Chronic obstructive pulmonary disease, unspecified: Secondary | ICD-10-CM | POA: Diagnosis not present

## 2018-12-16 DIAGNOSIS — J441 Chronic obstructive pulmonary disease with (acute) exacerbation: Secondary | ICD-10-CM

## 2018-12-16 NOTE — Assessment & Plan Note (Signed)
Superior segment on the right.  1 cm in diameter.  We agreed to repeat a CT scan of his chest in 3 months, if enlarging or high suspicion then we will arrange for navigational bronchoscopy.  I doubt he is a surgical resection candidate due to the severity of his obstructive lung disease.

## 2018-12-16 NOTE — Patient Instructions (Signed)
Please continue Stiolto 2 puffs once daily. Use your albuterol either 2 puffs or 1 nebulizer treatment up to every 4 hours if needed for shortness of breath, chest tightness, wheezing. We will work on getting some teaching so you can understand how to use and when to use your nebulizer machine. We will refer you to be assessed for a possible electric scooter to help with your mobility. Your oxygen level did not drop with walking at your last visit. We will repeat your pulmonary function testing to compare with priors. We will repeat your CT scan of the chest in 3 months to look for interval change.  Depending on the results we may decide to arrange a procedure to better evaluate your small pulmonary nodule. Follow with Dr Lamonte Sakai in 1 month

## 2018-12-16 NOTE — Telephone Encounter (Signed)
Received call report from [place] with [facility] in regards to pt's CT:  IMPRESSION: 1. 1.2 cm spiculated pulmonary soft tissue nodule in the superior segment of the right lower lobe, highly suspicious for primary lung malignancy. Consider one of the following in 3 months for both low-risk and high-risk individuals: (a) repeat chest CT, (b) follow-up PET-CT, or (c) tissue sampling. This recommendation follows the consensus statement: Guidelines for Management of Incidental Pulmonary Nodules Detected on CT Images: From the Fleischner Society 2017; Radiology 2017; 284:228-243. 2. Upper lobe predominant emphysema. 3. Question partially visualized left renal mass, versus volume averaging from the renal pelvis. Attention on PET-CT. 4. 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 5. Subcutaneous soft tissue nodules in the upper right back, etiology uncertain.  Routing to Dr. Lamonte Sakai. Dr. Lamonte Sakai, please advise on this and what you recommend. Thanks!

## 2018-12-16 NOTE — Assessment & Plan Note (Signed)
Significant decrease in his functional capacity which is clearly impairing his ability to do his ADL, take care of his wife who had a stroke.  I believe that this is subacute, slow progression of his COPD and exertional dyspnea.  He did not desaturate last visit but he could only walk 1 lap around the office before having to stop due to dyspnea.  He needs repeat pulmonary function testing.  This will help [place] assess the degree of his COPD, assess whether he may be a candidate for bronchoscopy and/or resection of his pulmonary nodule if this should become necessary.  Continue Stiolto, give him teaching on his albuterol nebs.  He wonders if he would benefit from electronic scooter and we will make a referral from DME company to assess.

## 2018-12-16 NOTE — Telephone Encounter (Signed)
Discussed with the patient today

## 2018-12-16 NOTE — Progress Notes (Signed)
Subjective:    Patient ID: Todd Lloyd, male    DOB: 1940/08/21, 78 y.o.   MRN: [id number]  Shortness of Breath Pertinent negatives include no ear pain, fever, headaches, leg swelling, rash, rhinorrhea, sore throat, vomiting or wheezing.   ROV 11/15/2018 --78 year old man whom I have followed for severe COPD.  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  He is followed also at the [place].  He has been managed on Stiolto.  Recently over the last 3 months he has had progressive dyspnea.  In mid May he was treated with a prednisone taper, changed from [place] to Trelegy to see if you get more benefit.  Part of his evaluation included a BNP which was elevated at 177 on 10/30/2018.  He received several days of Lasix with some improvement in his dyspnea.  He returns today reporting that his exertional tolerance is still poor, having trouble walking to mailbox. 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 He is hearing a lot of wheeze, sounds like it is throat noise. He doesn't believe the Trelegy helped him, or the prednisone.    TTE report 6/16 reviewed, shows intact LV fxn, some LVH, enlarged RA CXR 6/9 reviewed, no infiltrates.   ROV 12/16/2018 --Todd Lloyd has severe COPD, history of remote leukemia with BMTx.  At our last visit he was having increased dyspnea, unclear whether this was a progression of his COPD.  We continued him on Stiolto and added nebulized albuterol to see if he would get more benefit.  I performed a CT scan of his chest on 7/24 which I reviewed, this shows a new nodule 1.2 cm in the superior segment of the right lower lobe. He is unsure how to use nebulizer - didn't get much instruction on how to use, when to use.      Review of Systems  Constitutional: Negative for fever and unexpected weight change.  HENT: Negative for congestion, dental problem, ear pain,  nosebleeds, postnasal drip, rhinorrhea, sinus pressure, sneezing, sore throat and trouble swallowing.   Eyes: Negative for redness and itching.  Respiratory: Positive for shortness of breath. Negative for cough, chest tightness and wheezing.   Cardiovascular: Negative for palpitations and leg swelling.  Gastrointestinal: Negative for nausea and vomiting.  Genitourinary: Negative for dysuria.  Musculoskeletal: Negative for joint swelling.  Skin: Negative for rash.  Neurological: Negative for headaches.  Hematological: Does not bruise/bleed easily.  Psychiatric/Behavioral: Negative for dysphoric mood. The patient is not nervous/anxious.    Past Medical History:  Diagnosis Date  . Acid reflux   . Atrial fibrillation ([place])   . Chronic low back pain 07/16/2018  . Colon polyps   . COPD (chronic obstructive pulmonary disease) ([place])   . Gait abnormality 09/12/2016  . High cholesterol   . Leukemia ([place])    s/p bone marrow transplant in 1990  . Memory difficulties 09/12/2016  . Peripheral neuropathy 10/18/2016  . RLS (restless legs syndrome) 10/18/2016     Family History  Problem Relation Age of Onset  . Colon cancer Father   . Heart disease Father   . Leukemia Maternal Uncle   . Melanoma Mother   . Dementia Mother   . ALS Sister   . Lung cancer Brother      Social History   Socioeconomic History  . Marital status: Married    Spouse name: Hoyle Sauer  .  Number of children: 0  . Years of education: 59  . Highest education level: Not on file  Occupational History  . Occupation: Retired    Comment: Chartered loss adjuster  Social Needs  . Financial resource strain: Not on file  . Food insecurity    Worry: Not on file    Inability: Not on file  . Transportation needs    Medical: Not on file    Non-medical: Not on file  Tobacco Use  . Smoking status: Former Smoker    Packs/day: 2.00    Years: 43.00    Pack years: 86.00    Types: Cigarettes    Quit date: 05/22/2000    Years since  quitting: 18.5  . Smokeless tobacco: Never Used  . Tobacco comment: Counseled to remain smoke free  Substance and Sexual Activity  . Alcohol use: No    Alcohol/week: 0.0 standard drinks  . Drug use: No  . Sexual activity: Not on file  Lifestyle  . Physical activity    Days per week: Not on file    Minutes per session: Not on file  . Stress: Not on file  Relationships  . Social Herbalist on phone: Not on file    Gets together: Not on file    Attends religious service: Not on file    Active member of club or organization: Not on file    Attends meetings of clubs or organizations: Not on file    Relationship status: Not on file  . Intimate partner violence    Fear of current or ex partner: Not on file    Emotionally abused: Not on file    Physically abused: Not on file    Forced sexual activity: Not on file  Other Topics Concern  . 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 Lyrica [Pregabalin]     confusion  . Morphine And Related     confusion     Outpatient Medications Prior to Visit  Medication Sig Dispense Refill  . albuterol (PROVENTIL HFA;VENTOLIN HFA) 108 (90 BASE) MCG/ACT inhaler Inhale 2 puffs into the lungs every 6 (six) hours as needed. For shortness of breath    . albuterol (PROVENTIL) (2.5 MG/3ML) 0.083% nebulizer solution Use 2.5 mL by nebulization every 6 hours and as needed J44.9 300 mL 11  . digoxin (LANOXIN) 0.25 MG tablet Take 250 mcg by mouth daily.    Marland Kitchen donepezil (ARICEPT) 10 MG tablet Take 10 mg by mouth at bedtime.    Marland Kitchen ezetimibe (ZETIA) 10 MG tablet Take 1 tablet by mouth daily.    . furosemide (LASIX) 20 MG tablet Take 1 tablet (20 mg total) by mouth daily. 7 tablet 0  . gabapentin (NEURONTIN) 300 MG capsule Take 1 capsule  (300 mg total) by mouth 2 (two) times daily. 60 capsule 2  . losartan (COZAAR) 25 MG tablet Take 25 mg by mouth 2 (two) times a day.    . Mometasone Furoate (ASMANEX HFA) 100 MCG/ACT AERO Inhale 2 puffs into the lungs 2 (two) times daily. 13 g 0  . omeprazole (PRILOSEC) 20 MG capsule Take 20 mg by mouth daily.    Marland Kitchen oxyCODONE-acetaminophen (PERCOCET) 10-325 MG tablet Take 1 tablet by  mouth 2 (two) times daily.    . pramipexole (MIRAPEX) 1 MG tablet Take 1 mg by mouth 3 (three) times daily.    . rivaroxaban (XARELTO) 20 MG TABS tablet Take 20 mg by mouth daily with supper.    . rosuvastatin (CRESTOR) 10 MG tablet Take 1 tablet (10 mg total) by mouth daily. 30 tablet 5  . Tiotropium Bromide-Olodaterol (STIOLTO RESPIMAT) 2.5-2.5 MCG/ACT AERS Inhale 2 puffs into the lungs daily. 1 Inhaler 5   Facility-Administered Medications Prior to Visit  Medication Dose Route Frequency Provider Last Rate Last Dose  . 0.9 %  sodium chloride infusion  500 mL Intravenous Continuous Milus Banister, MD             Objective:   Physical Exam Vitals:   12/16/18 1439  BP: 122/72  Pulse: 70  SpO2: 94%  Weight: 201 lb (91.2 kg)  Height: 6' (1.829 m)   Gen: Pleasant, elderly man, in no distress,  normal affect  ENT: No lesions,  mouth clear,  oropharynx clear, no postnasal drip  Neck: No JVD, no stridor  Lungs: No use of accessory muscles, distant, no wheeze   Cardiovascular: RRR, heart sounds normal, no murmur or gallops, no peripheral edema  Musculoskeletal: No deformities, no cyanosis or clubbing  Neuro: alert, non focal  Skin: Warm, no lesions or rashes      Assessment & Plan:  COPD (chronic obstructive pulmonary disease) ([facility]) Significant decrease in his functional capacity which is clearly impairing his ability to do his ADL, take care of his wife who had a stroke.  I believe that this is subacute, slow progression of his COPD and exertional dyspnea.  He did not desaturate last visit but  he could only walk 1 lap around the office before having to stop due to dyspnea.  He needs repeat pulmonary function testing.  This will help [place] assess the degree of his COPD, assess whether he may be a candidate for bronchoscopy and/or resection of his pulmonary nodule if this should become necessary.  Continue Stiolto, give him teaching on his albuterol nebs.  He wonders if he would benefit from electronic scooter and we will make a referral from DME company to assess.  Solitary pulmonary nodule Superior segment on the right.  1 cm in diameter.  We agreed to repeat a CT scan of his chest in 3 months, if enlarging or high suspicion then we will arrange for navigational bronchoscopy.  I doubt he is a surgical resection candidate due to the severity of his obstructive lung disease.  [name], MD, PhD 12/16/2018, 3:03 PM [facility] [phone] or if no answer [phone]

## 2019-01-01 ENCOUNTER — Other Ambulatory Visit: Payer: Self-pay | Admitting: Neurology

## 2019-01-16 ENCOUNTER — Ambulatory Visit (INDEPENDENT_AMBULATORY_CARE_PROVIDER_SITE_OTHER): Payer: Medicare Other | Admitting: Neurology

## 2019-01-16 ENCOUNTER — Other Ambulatory Visit: Payer: Self-pay

## 2019-01-16 ENCOUNTER — Encounter

## 2019-01-16 ENCOUNTER — Ambulatory Visit: Payer: Medicare Other | Admitting: Emergency Medicine

## 2019-01-16 ENCOUNTER — Encounter: Payer: Self-pay | Admitting: Neurology

## 2019-01-16 VITALS — BP 144/75 | HR 88 | Temp 97.8°F | Ht 72.0 in | Wt 203.0 lb

## 2019-01-16 DIAGNOSIS — M5441 Lumbago with sciatica, right side: Secondary | ICD-10-CM

## 2019-01-16 DIAGNOSIS — R413 Other amnesia: Secondary | ICD-10-CM | POA: Diagnosis not present

## 2019-01-16 DIAGNOSIS — G8929 Other chronic pain: Secondary | ICD-10-CM | POA: Diagnosis not present

## 2019-01-16 DIAGNOSIS — G2581 Restless legs syndrome: Secondary | ICD-10-CM

## 2019-01-16 DIAGNOSIS — R269 Unspecified abnormalities of gait and mobility: Secondary | ICD-10-CM

## 2019-01-16 MED ORDER — DULOXETINE HCL 30 MG PO CPEP: ORAL_CAPSULE | ORAL | 3 refills | Status: DC

## 2019-01-16 MED ORDER — PRAMIPEXOLE DIHYDROCHLORIDE 1 MG PO TABS: 1.0000 mg | ORAL_TABLET | Freq: Two times a day (BID) | ORAL | 5 refills | Status: DC

## 2019-01-16 MED ORDER — BACLOFEN 10 MG PO TABS: 5.0000 mg | ORAL_TABLET | Freq: Two times a day (BID) | ORAL | 3 refills | Status: DC

## 2019-01-16 NOTE — Patient Instructions (Signed)
We can go up on the mirapex to 1 mg twice a day for the restless legs.  We will start Cymbalta 30 mg daily for one week, then take one twice a day.  We will start baclofen 10 mg taking 1/2 tablet twice a day.   Cymbalta (duloxetine) is an antidepressant medication that is commonly used for peripheral neuropathy pain or for fibromyalgia pain. As with any antidepressant medication, worsening depression can be seen. This medication can potentially cause headache, dizziness, sexual dysfunction, or nausea. If any problems are noted on this medication, please contact our office.
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
## 2019-01-17 ENCOUNTER — Other Ambulatory Visit: Payer: Self-pay

## 2019-01-17 ENCOUNTER — Ambulatory Visit (INDEPENDENT_AMBULATORY_CARE_PROVIDER_SITE_OTHER): Payer: Medicare Other | Admitting: Emergency Medicine

## 2019-01-17 ENCOUNTER — Encounter: Payer: Self-pay | Admitting: Emergency Medicine

## 2019-01-17 ENCOUNTER — Telehealth: Payer: Self-pay | Admitting: Emergency Medicine

## 2019-01-17 DIAGNOSIS — R911 Solitary pulmonary nodule: Secondary | ICD-10-CM | POA: Diagnosis not present

## 2019-01-17 DIAGNOSIS — J449 Chronic obstructive pulmonary disease, unspecified: Secondary | ICD-10-CM

## 2019-01-17 DIAGNOSIS — R918 Other nonspecific abnormal finding of lung field: Secondary | ICD-10-CM

## 2019-01-17 MED ORDER — IPRATROPIUM-ALBUTEROL 0.5-2.5 (3) MG/3ML IN SOLN: 3.0000 mL | Freq: Four times a day (QID) | RESPIRATORY_TRACT | 11 refills | Status: DC

## 2019-01-17 NOTE — Telephone Encounter (Signed)
Prescription based on patient visit 01/17/19 with Dr. Lamonte Sakai. Note stated "We had planned to repeat your breathing tests. I would still like to schedule this to compare with your priors.  Please temporarily stop your Stiolto maintenance inhaler. We will start ipratropium/albuterol (DuoNeb) nebulizer treatments 4 times a day on a schedule.  You will take these every day, 4 times a day as your maintenance treatments. You can still use either an albuterol inhaler 2 puffs or your albuterol nebulizer if you need it for shortness of breath in between your scheduled treatments above."  Called back [facility] and spoke with Magda Paganini and advised her of the above from Dr. Lamonte Sakai. She stated they will forward the prescription as directed. Nothing further needed at this time.

## 2019-01-17 NOTE — Progress Notes (Signed)
Subjective:    Patient ID: Todd Lloyd, male    DOB: 16-Mar-1941, 78 y.o.   MRN: [id number]  Shortness of Breath Pertinent negatives include no ear pain, fever, headaches, leg swelling, rash, rhinorrhea, sore throat, vomiting or wheezing.   ROV 12/16/2018 --[name] has severe COPD, history of remote leukemia with BMTx.  At our last visit he was having increased dyspnea, unclear whether this was a progression of his COPD.  We continued him on Stiolto and added nebulized albuterol to see if he would get more benefit.  I performed a CT scan of his chest on 7/24 which I reviewed, this shows a new nodule 1.2 cm in the superior segment of the right lower lobe. He is unsure how to use nebulizer - didn't get much instruction on how to use, when to use.    ROV 01/17/2019 --78 year old man with severe COPD and associated hypoxemic respiratory failure.  Past medical history also significant for remote leukemia with bone marrow transplant.  He reported a significant decrease in his functional capacity when I saw him a month ago.  I continued him on Stiolto and albuterol as needed. Uses 2-3x a day, but he is still very limited w exertion.  We had planned to repeat his pulmonary function testing but this has not been done. He also requested a motorized scooter. We doesn't recall speaking to anyone from [facility] about this. Discussed pulm rehab w him today - he is limited by his need to care for his wife at home.  He has a 1 cm right superior segmental nodule on CT chest.  We plan to repeat his scan in October, consider suitability for bronchoscopy if this is growing.   Review of Systems  Constitutional: Negative for fever and unexpected weight change.  HENT: Negative for congestion, dental problem, ear pain, nosebleeds, postnasal drip, rhinorrhea, sinus pressure, sneezing, sore throat and trouble swallowing.   Eyes: Negative for redness and itching.  Respiratory: Positive for shortness of breath.  Negative for cough, chest tightness and wheezing.   Cardiovascular: Negative for palpitations and leg swelling.  Gastrointestinal: Negative for nausea and vomiting.  Genitourinary: Negative for dysuria.  Musculoskeletal: Negative for joint swelling.  Skin: Negative for rash.  Neurological: Negative for headaches.  Hematological: Does not bruise/bleed easily.  Psychiatric/Behavioral: Negative for dysphoric mood. The patient is not nervous/anxious.    Past Medical History:  Diagnosis Date  . Acid reflux   . Atrial fibrillation ([place])   . Chronic low back pain 07/16/2018  . Colon polyps   . COPD (chronic obstructive pulmonary disease) ([place])   . Gait abnormality 09/12/2016  . High cholesterol   . 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 Memory difficulties 09/12/2016  . Peripheral neuropathy 10/18/2016  . RLS (restless legs syndrome) 10/18/2016     Family History  Problem Relation Age of Onset  . Colon cancer Father   . Heart disease Father   . Leukemia Maternal Uncle   . Melanoma Mother   . Dementia Mother   . ALS Sister   . Lung cancer Brother      Social History   Socioeconomic History  . Marital status: Married    Spouse name: Hoyle Sauer  . Number of children: 0  . Years of education: 25  . Highest education level: Not on file  Occupational History  . Occupation: Retired    Comment: Chartered loss adjuster  Social Needs  . Emergency planning/management officer  strain: Not on file  . Food insecurity    Worry: Not on file    Inability: Not on file  . Transportation needs    Medical: Not on file    Non-medical: Not on file  Tobacco Use  . Smoking status: Former Smoker    Packs/day: 2.00    Years: 43.00    Pack years: 86.00    Types: Cigarettes    Quit date: 05/22/2000    Years since quitting: 18.6  . Smokeless tobacco: Never Used  . Tobacco comment: Counseled to remain smoke free  Substance and Sexual Activity  . Alcohol use: No    Alcohol/week: 0.0 standard drinks   . Drug use: No  . Sexual activity: Not on file  Lifestyle  . Physical activity    Days per week: Not on file    Minutes per session: Not on file  . Stress: Not on file  Relationships  . Social Herbalist on phone: Not on file    Gets together: Not on file    Attends religious service: Not on file    Active member of club or organization: Not on file    Attends meetings of clubs or organizations: Not on file    Relationship status: Not on file  . Intimate partner violence    Fear of current or ex partner: Not on file    Emotionally abused: Not on file    Physically abused: Not on file    Forced sexual activity: Not on file  Other Topics Concern  . 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 Lyrica [Pregabalin]     confusion  . Morphine And Related     confusion     Outpatient Medications Prior to Visit  Medication Sig Dispense Refill  . albuterol (PROVENTIL HFA;VENTOLIN HFA) 108 (90 BASE) MCG/ACT inhaler Inhale 2 puffs into the lungs every 6 (six) hours as needed. For shortness of breath    . albuterol (PROVENTIL) (2.5 MG/3ML) 0.083% nebulizer solution Use 2.5 mL by nebulization every 6 hours and as needed J44.9 300 mL 11  . baclofen (LIORESAL) 10 MG tablet Take 0.5 tablets (5 mg total) by mouth 2 (two) times daily. 30 each 3  . digoxin (LANOXIN) 0.25 MG tablet Take 250 mcg by mouth daily.    Marland Kitchen donepezil (ARICEPT) 10 MG tablet Take 10 mg by mouth at bedtime.    . DULoxetine (CYMBALTA) 30 MG capsule One tablet daily for 1 week, then take 1 tablet twice a day 60 capsule 3  . ezetimibe (ZETIA) 10 MG tablet Take 1 tablet by mouth daily.    . furosemide (LASIX) 20 MG tablet Take 1 tablet (20 mg total) by mouth daily. 7 tablet 0  . losartan (COZAAR) 25 MG  tablet Take 25 mg by mouth 2 (two) times a day.    Marland Kitchen omeprazole (PRILOSEC) 20 MG capsule Take 20 mg by mouth daily.    Marland Kitchen oxyCODONE-acetaminophen (PERCOCET) 10-325 MG tablet Take 1 tablet by mouth 2 (two) times daily.    . pramipexole (MIRAPEX) 1 MG tablet Take 1 tablet (1 mg total) by mouth 2 (two) times daily. 60 tablet 5  . rivaroxaban (XARELTO) 20 MG TABS tablet Take  20 mg by mouth daily with supper.    . rosuvastatin (CRESTOR) 10 MG tablet Take 10 mg by mouth daily.    . Tiotropium Bromide-Olodaterol (STIOLTO RESPIMAT) 2.5-2.5 MCG/ACT AERS Inhale 2 puffs into the lungs daily. 1 Inhaler 5  . Mometasone Furoate (ASMANEX HFA) 100 MCG/ACT AERO Inhale 2 puffs into the lungs 2 (two) times daily. 13 g 0   Facility-Administered Medications Prior to Visit  Medication Dose Route Frequency Provider Last Rate Last Dose  . 0.9 %  sodium chloride infusion  500 mL Intravenous Continuous Milus Banister, MD             Objective:   Physical Exam Vitals:   01/17/19 1420  BP: (!) 146/88  Pulse: 80  SpO2: 95%  Weight: 203 lb (92.1 kg)  Height: 6' (1.829 m)   Gen: Pleasant, elderly man, in no distress,  normal affect  ENT: No lesions,  mouth clear,  oropharynx clear, no postnasal drip  Neck: No JVD, no stridor  Lungs: No use of accessory muscles, distant, no wheeze   Cardiovascular: RRR, heart sounds normal, no murmur or gallops, no peripheral edema  Musculoskeletal: No deformities, no cyanosis or clubbing  Neuro: alert, somewhat flustered with poor memory of his meds, our previous plans.   Skin: Warm, no lesions or rashes      Assessment & Plan:  COPD (chronic obstructive pulmonary disease) ([place]) He has difficulty giving history, difficulty describing his medical regimen.  Unclear to me that he takes the [facility] correctly.  All the same is clear that he is experiencing decreased functional capacity, dyspnea whenever he tries to do significant exertion.  He is the primary caregiver  for his wife.  He has had a reassuring echocardiogram and over the last 4 years a reassuring overall cardiac evaluation.  He does have severe COPD.  I repeated his walking oximetry today and he did not qualify for supplemental oxygen.  Unclear where to go from here but I think it would be reasonable to try changing him to only nebulized medications to see if there is an issue with adequate medication delivery.  I will change him to DuoNeb, stop the Stiolto, see if he benefits.  He is interested in a motorized scooter and I will make the referral.  He did not get repeat pulmonary function testing as we had discussed.  Still may be beneficial to get these going forward.  If they are unchanged and if he does not respond to the change to [facility], then we may have to reconsider possible cardiac contribution.  I can have him do a cardiopulmonary stress test or send him back to review his symptoms with Dr. Oval Linsey.  Solitary pulmonary nodule 1 cm nodule in the superior segment of the right lower lobe that needs follow-up scan in October 2020.  [name], MD, PhD 01/17/2019, 3:01 PM [facility] [phone] or if no answer [phone]

## 2019-01-17 NOTE — Assessment & Plan Note (Addendum)
He has difficulty giving history, difficulty describing his medical regimen.  Unclear to me that he takes the [facility] correctly.  All the same is clear that he is experiencing decreased functional capacity, dyspnea whenever he tries to do significant exertion.  He is the primary caregiver for his wife.  He has had a reassuring echocardiogram and over the last 4 years a reassuring overall cardiac evaluation.  He does have severe COPD.  I repeated his walking oximetry today and he did not qualify for supplemental oxygen.  Unclear where to go from here but I think it would be reasonable to try changing him to only nebulized medications to see if there is an issue with adequate medication delivery.  I will change him to DuoNeb, stop the Stiolto, see if he benefits.  He is interested in a motorized scooter and I will make the referral.  He did not get repeat pulmonary function testing as we had discussed.  Still may be beneficial to get these going forward.  If they are unchanged and if he does not respond to the change to [facility], then we may have to reconsider possible cardiac contribution.  I can have him do a cardiopulmonary stress test or send him back to review his symptoms with Dr. Oval Linsey.

## 2019-01-17 NOTE — Patient Instructions (Addendum)
We had planned to repeat your breathing tests. I would still like to schedule this to compare with your priors.  Please temporarily stop your Stiolto maintenance inhaler. We will start ipratropium/albuterol (DuoNeb) nebulizer treatments 4 times a day on a schedule.  You will take these every day, 4 times a day as your maintenance treatments. You can still use either an albuterol inhaler 2 puffs or your albuterol nebulizer if you need it for shortness of breath in between your scheduled treatments above. We will make another medical equipment referral for assessment for possible electric scooter. We will repeat your CT chest in October 2020 Follow with Dr Lamonte Sakai in October after the scan to review the results together.

## 2019-01-17 NOTE — Assessment & Plan Note (Signed)
1 cm nodule in the superior segment of the right lower lobe that needs follow-up scan in October 2020.

## 2019-01-30 DIAGNOSIS — Z6827 Body mass index (BMI) 27.0-27.9, adult: Secondary | ICD-10-CM | POA: Diagnosis not present

## 2019-01-30 DIAGNOSIS — I10 Essential (primary) hypertension: Secondary | ICD-10-CM | POA: Diagnosis not present

## 2019-01-30 DIAGNOSIS — M545 Low back pain: Secondary | ICD-10-CM | POA: Diagnosis not present

## 2019-01-30 DIAGNOSIS — M4802 Spinal stenosis, cervical region: Secondary | ICD-10-CM | POA: Diagnosis not present

## 2019-01-31 ENCOUNTER — Telehealth: Payer: Self-pay | Admitting: Emergency Medicine

## 2019-01-31 NOTE — Telephone Encounter (Signed)
The order was placed for the electric wheelchair to [facility]. They stated they would send the order to the respiratory department. ATC pt, no answer. Left message for pt to call back.

## 2019-02-04 NOTE — Telephone Encounter (Signed)
Called and spoke to pt. Pt states he still hasnt received a call from [facility], the order was placed late July. Called Melissa with [employer] and was informed [employer] has a dedicated person for power chairs, Andria Rhein. Melissa advised to communicate through community message and she will respond quickly. Message has been sent and copied Melissa on the message. Will await response.

## 2019-02-06 NOTE — Telephone Encounter (Signed)
Dorothyann Gibbs, Zettie Pho, RN; Dixie, Louann Liv, Melissa        Alfred,   Yes I do see the order was placed. I have reached out to our team to please contact patient. Thank you so much for the follow up!   --------------------------------------------------------------- Spoke with pt. He is aware of this information. Nothing further was needed at this time.

## 2019-02-14 ENCOUNTER — Telehealth: Payer: Self-pay | Admitting: Emergency Medicine

## 2019-02-14 DIAGNOSIS — R0609 Other forms of dyspnea: Secondary | ICD-10-CM

## 2019-02-14 DIAGNOSIS — J441 Chronic obstructive pulmonary disease with (acute) exacerbation: Secondary | ICD-10-CM

## 2019-02-14 NOTE — Telephone Encounter (Signed)
Attempted to call patient, he wasn't home, left message for him to call back.

## 2019-02-14 NOTE — Telephone Encounter (Signed)
If he is having side effects from DuoNeb qid, then we could try changing him to Brovana nebs BID, see if he tolerates. If so then I could assess him, consider adding a second neb on as well (LAMA).   OK with me to change the order for electric wheelchair to scooter

## 2019-02-14 NOTE — Telephone Encounter (Signed)
Called and spoke to patient.  Patient reported that he was taking Duoneb as prescribed but that after use he was having increased urination and bowel movements. Patient also stated he was having increased fatigue and chest tightness.  Patient reported that he stopped using the Duoneb a few days ago and has started to feel better.  Patient would like to let Dr. Lamonte Sakai know and see if there is anything else he can use in place of the Duoneb.  Patient also was not happy that order was placed for an electric wheelchair. Patient stated he needs a small electric scooter. Patient is asking if we can place an order for a scooter instead of wheelchair.  Patient will not be available from 12-2:30 this afternoon but stated we can leave a detailed message.  Dr. Lamonte Sakai please advise. Thank you!

## 2019-02-17 ENCOUNTER — Other Ambulatory Visit: Payer: Medicare Other

## 2019-02-17 MED ORDER — ARFORMOTEROL TARTRATE 15 MCG/2ML IN NEBU: 15.0000 ug | INHALATION_SOLUTION | Freq: Two times a day (BID) | RESPIRATORY_TRACT | 5 refills | Status: DC

## 2019-02-17 NOTE — Telephone Encounter (Signed)
LMTCB

## 2019-02-17 NOTE — Telephone Encounter (Signed)
Pt returning call about medication and scooter.  [phone].

## 2019-02-17 NOTE — Telephone Encounter (Signed)
Called spoke with patient and discussed RB's recommendations as stated below.  Patient okay with switching to Brovana BID and will call back to be reassessed.  Brovana sent to verified pharmacy. Patient aware to stop the Duoneb. Order for scooter placed to [facility].

## 2019-03-12 DIAGNOSIS — M4802 Spinal stenosis, cervical region: Secondary | ICD-10-CM | POA: Diagnosis not present

## 2019-03-12 DIAGNOSIS — M542 Cervicalgia: Secondary | ICD-10-CM | POA: Diagnosis not present

## 2019-03-12 DIAGNOSIS — G5623 Lesion of ulnar nerve, bilateral upper limbs: Secondary | ICD-10-CM | POA: Diagnosis not present

## 2019-03-12 DIAGNOSIS — I10 Essential (primary) hypertension: Secondary | ICD-10-CM | POA: Diagnosis not present

## 2019-03-12 DIAGNOSIS — Z6827 Body mass index (BMI) 27.0-27.9, adult: Secondary | ICD-10-CM | POA: Diagnosis not present

## 2019-03-13 ENCOUNTER — Telehealth: Payer: Self-pay

## 2019-03-13 DIAGNOSIS — R911 Solitary pulmonary nodule: Secondary | ICD-10-CM

## 2019-03-13 NOTE — Telephone Encounter (Signed)
-----   Message from Osvaldo Shipper, [place] sent at 03/13/2019 10:28 AM EDT ----- Regarding: CT need New order and to be Resch Please put in new order and reschedule patient at another facility. We are unable to do CT because of insurance. Pt appt was scheduled 10/27.   Pt is aware appt was canceled.  Thank you   Erline Levine

## 2019-03-13 NOTE — Telephone Encounter (Signed)
New order placed due to insurance. Nothing further needed at this time.

## 2019-03-18 ENCOUNTER — Inpatient Hospital Stay: Admission: RE | Admit: 2019-03-18 | Payer: Medicare Other | Source: Ambulatory Visit

## 2019-03-18 ENCOUNTER — Other Ambulatory Visit: Payer: Self-pay

## 2019-03-18 ENCOUNTER — Ambulatory Visit
Admission: RE | Admit: 2019-03-18 | Discharge: 2019-03-18 | Disposition: A | Discharge status: Home / Self Care | Payer: Medicare Other | Source: Ambulatory Visit | Attending: Emergency Medicine | Admitting: Emergency Medicine

## 2019-03-18 DIAGNOSIS — R911 Solitary pulmonary nodule: Secondary | ICD-10-CM

## 2019-03-18 NOTE — Telephone Encounter (Signed)
This has been addressed. See phone note 03/13/19. Nothing further needed at this time.

## 2019-03-19 DIAGNOSIS — Z5181 Encounter for therapeutic drug level monitoring: Secondary | ICD-10-CM | POA: Diagnosis not present

## 2019-03-19 DIAGNOSIS — E7800 Pure hypercholesterolemia, unspecified: Secondary | ICD-10-CM | POA: Diagnosis not present

## 2019-03-19 DIAGNOSIS — I10 Essential (primary) hypertension: Secondary | ICD-10-CM | POA: Diagnosis not present

## 2019-03-20 LAB — LIPID PANEL
Chol/HDL Ratio: 2.2 ratio (ref 0.0–5.0)
Cholesterol, Total: 145 mg/dL (ref 100–199)
HDL: 66 mg/dL (ref >39)
LDL Chol Calc (NIH): 65 mg/dL (ref 0–99)
Triglycerides: 69 mg/dL (ref 0–149)
VLDL Cholesterol Cal: 14 mg/dL (ref 5–40)

## 2019-03-20 LAB — COMPREHENSIVE METABOLIC PANEL
ALT: 12 IU/L (ref 0–44)
AST: 16 IU/L (ref 0–40)
Albumin/Globulin Ratio: 2.5 — ABNORMAL HIGH (ref 1.2–2.2)
Albumin: 4.2 g/dL (ref 3.7–4.7)
Alkaline Phosphatase: 56 IU/L (ref 39–117)
BUN/Creatinine Ratio: 12 (ref 10–24)
BUN: 13 mg/dL (ref 8–27)
Bilirubin Total: 0.4 mg/dL (ref 0.0–1.2)
CO2: 31 mmol/L — ABNORMAL HIGH (ref 20–29)
Calcium: 9.2 mg/dL (ref 8.6–10.2)
Chloride: 101 mmol/L (ref 96–106)
Creatinine, Ser: 1.09 mg/dL (ref 0.76–1.27)
GFR calc Af Amer: 74 mL/min/{1.73_m2} (ref >59)
GFR calc non Af Amer: 64 mL/min/{1.73_m2} (ref >59)
Globulin, Total: 1.7 g/dL (ref 1.5–4.5)
Glucose: 92 mg/dL (ref 65–99)
Potassium: 5.3 mmol/L — ABNORMAL HIGH (ref 3.5–5.2)
Sodium: 144 mmol/L (ref 134–144)
Total Protein: 5.9 g/dL — ABNORMAL LOW (ref 6.0–8.5)

## 2019-03-26 DIAGNOSIS — I4891 Unspecified atrial fibrillation: Secondary | ICD-10-CM | POA: Diagnosis not present

## 2019-03-26 DIAGNOSIS — I10 Essential (primary) hypertension: Secondary | ICD-10-CM | POA: Diagnosis not present

## 2019-03-26 DIAGNOSIS — J449 Chronic obstructive pulmonary disease, unspecified: Secondary | ICD-10-CM | POA: Diagnosis not present

## 2019-03-26 DIAGNOSIS — G2581 Restless legs syndrome: Secondary | ICD-10-CM | POA: Diagnosis not present

## 2019-03-26 DIAGNOSIS — E7800 Pure hypercholesterolemia, unspecified: Secondary | ICD-10-CM | POA: Diagnosis not present

## 2019-03-26 DIAGNOSIS — D6869 Other thrombophilia: Secondary | ICD-10-CM | POA: Diagnosis not present

## 2019-03-26 DIAGNOSIS — Z7901 Long term (current) use of anticoagulants: Secondary | ICD-10-CM | POA: Diagnosis not present

## 2019-03-26 DIAGNOSIS — Z23 Encounter for immunization: Secondary | ICD-10-CM | POA: Diagnosis not present

## 2019-03-26 DIAGNOSIS — F332 Major depressive disorder, recurrent severe without psychotic features: Secondary | ICD-10-CM | POA: Diagnosis not present

## 2019-03-26 DIAGNOSIS — F0390 Unspecified dementia without behavioral disturbance: Secondary | ICD-10-CM | POA: Diagnosis not present

## 2019-04-01 ENCOUNTER — Other Ambulatory Visit: Payer: Self-pay | Admitting: Cardiovascular Disease

## 2019-04-22 DIAGNOSIS — M4802 Spinal stenosis, cervical region: Secondary | ICD-10-CM | POA: Diagnosis not present

## 2019-04-22 DIAGNOSIS — Z6827 Body mass index (BMI) 27.0-27.9, adult: Secondary | ICD-10-CM | POA: Diagnosis not present

## 2019-04-22 DIAGNOSIS — M5416 Radiculopathy, lumbar region: Secondary | ICD-10-CM | POA: Diagnosis not present

## 2019-04-22 DIAGNOSIS — G5623 Lesion of ulnar nerve, bilateral upper limbs: Secondary | ICD-10-CM | POA: Diagnosis not present

## 2019-04-22 DIAGNOSIS — I10 Essential (primary) hypertension: Secondary | ICD-10-CM | POA: Diagnosis not present

## 2019-04-27 ENCOUNTER — Other Ambulatory Visit: Payer: Self-pay

## 2019-04-27 ENCOUNTER — Emergency Department (HOSPITAL_COMMUNITY): Payer: Medicare Other

## 2019-04-27 ENCOUNTER — Emergency Department (HOSPITAL_COMMUNITY)
Admission: EM | Admit: 2019-04-27 | Discharge: 2019-04-27 | Disposition: A | Discharge status: Home / Self Care | Payer: Medicare Other | Attending: Emergency Medicine | Admitting: Emergency Medicine

## 2019-04-27 ENCOUNTER — Encounter (HOSPITAL_COMMUNITY): Payer: Self-pay | Admitting: Emergency Medicine

## 2019-04-27 DIAGNOSIS — I10 Essential (primary) hypertension: Secondary | ICD-10-CM | POA: Diagnosis not present

## 2019-04-27 DIAGNOSIS — Z7901 Long term (current) use of anticoagulants: Secondary | ICD-10-CM | POA: Diagnosis not present

## 2019-04-27 DIAGNOSIS — Y999 Unspecified external cause status: Secondary | ICD-10-CM | POA: Diagnosis not present

## 2019-04-27 DIAGNOSIS — J449 Chronic obstructive pulmonary disease, unspecified: Secondary | ICD-10-CM | POA: Diagnosis not present

## 2019-04-27 DIAGNOSIS — Z79899 Other long term (current) drug therapy: Secondary | ICD-10-CM | POA: Diagnosis not present

## 2019-04-27 DIAGNOSIS — Y92018 Other place in single-family (private) house as the place of occurrence of the external cause: Secondary | ICD-10-CM | POA: Insufficient documentation

## 2019-04-27 DIAGNOSIS — Y9389 Activity, other specified: Secondary | ICD-10-CM | POA: Insufficient documentation

## 2019-04-27 DIAGNOSIS — W01198A Fall on same level from slipping, tripping and stumbling with subsequent striking against other object, initial encounter: Secondary | ICD-10-CM | POA: Insufficient documentation

## 2019-04-27 DIAGNOSIS — S81011A Laceration without foreign body, right knee, initial encounter: Secondary | ICD-10-CM | POA: Insufficient documentation

## 2019-04-27 DIAGNOSIS — Z87891 Personal history of nicotine dependence: Secondary | ICD-10-CM | POA: Insufficient documentation

## 2019-04-27 MED ORDER — ACETAMINOPHEN 500 MG PO TABS
1000.0000 mg | ORAL_TABLET | Freq: Once | ORAL | Status: AC
  Administered 2019-04-27: 1000 mg via ORAL
  Filled 2019-04-27: qty 2

## 2019-04-27 MED ORDER — LIDOCAINE-EPINEPHRINE (PF) 2 %-1:200000 IJ SOLN
10.0000 mL | Freq: Once | INTRAMUSCULAR | Status: AC
  Administered 2019-04-27: 10 mL via INTRADERMAL
  Filled 2019-04-27: qty 20

## 2019-04-27 MED ORDER — LIDOCAINE-EPINEPHRINE 1 %-1:100000 IJ SOLN
10.0000 mL | Freq: Once | INTRAMUSCULAR | Status: DC
  Filled 2019-04-27: qty 10

## 2019-04-27 NOTE — ED Triage Notes (Signed)
Laceration to R lower leg.  States he tripped over concrete when going to feed his animals tonight.  Pt on blood thinners.  Actively bleeding on arrival. Pressure dressing in place on arrival.

## 2019-04-27 NOTE — ED Notes (Signed)
Discharge instructions including pain management, follow up care, and suture care discussed with pt. Pt was able to verbalize understanding with no questions at this time. Pt to go home with friend. Pt able to ambulate independently.

## 2019-04-27 NOTE — ED Notes (Signed)
Neighbor Jeneen Rinks to be called when pt in room. [phone]

## 2019-04-27 NOTE — ED Notes (Signed)
Pt able to ambulate in room independently.
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
## 2019-05-12 ENCOUNTER — Other Ambulatory Visit: Payer: Self-pay

## 2019-05-12 ENCOUNTER — Ambulatory Visit (INDEPENDENT_AMBULATORY_CARE_PROVIDER_SITE_OTHER): Payer: Medicare Other | Admitting: Orthopedic Surgery

## 2019-05-12 ENCOUNTER — Other Ambulatory Visit: Payer: Self-pay | Admitting: Physician Assistant

## 2019-05-12 ENCOUNTER — Encounter (HOSPITAL_COMMUNITY): Payer: Self-pay | Admitting: Orthopedic Surgery

## 2019-05-12 ENCOUNTER — Encounter: Payer: Self-pay | Admitting: Physician Assistant

## 2019-05-12 VITALS — Ht 72.0 in | Wt 203.0 lb

## 2019-05-12 DIAGNOSIS — L97824 Non-pressure chronic ulcer of other part of left lower leg with necrosis of bone: Secondary | ICD-10-CM

## 2019-05-12 NOTE — Progress Notes (Signed)
Mr Benjamine Mola denies chest pain or shortness of breath.  Patient denies any s/s of Covid for him or his family.  Mr. Dejaynes will go to [facility] @ 1000, prior to arrival at [facility] at 1020.
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
## 2019-05-13 ENCOUNTER — Ambulatory Visit (HOSPITAL_COMMUNITY): Payer: Medicare Other | Admitting: Anesthesiology

## 2019-05-13 ENCOUNTER — Other Ambulatory Visit: Payer: Self-pay | Admitting: Physician Assistant

## 2019-05-13 ENCOUNTER — Other Ambulatory Visit: Payer: Self-pay

## 2019-05-13 ENCOUNTER — Encounter (HOSPITAL_COMMUNITY): Payer: Self-pay | Admitting: Orthopedic Surgery

## 2019-05-13 ENCOUNTER — Other Ambulatory Visit (HOSPITAL_COMMUNITY)
Admission: RE | Admit: 2019-05-13 | Discharge: 2019-05-13 | Disposition: A | Discharge status: Home / Self Care | Payer: Medicare Other | Source: Ambulatory Visit | Attending: Orthopedic Surgery | Admitting: Orthopedic Surgery

## 2019-05-13 ENCOUNTER — Ambulatory Visit (HOSPITAL_COMMUNITY)
Admission: RE | Admit: 2019-05-13 | Discharge: 2019-05-13 | Disposition: A | Discharge status: Home / Self Care | Payer: Medicare Other | Attending: Orthopedic Surgery | Admitting: Orthopedic Surgery

## 2019-05-13 ENCOUNTER — Telehealth: Payer: Self-pay | Admitting: Cardiovascular Disease

## 2019-05-13 ENCOUNTER — Encounter (HOSPITAL_COMMUNITY)
Admission: RE | Disposition: A | Discharge status: Home / Self Care | Payer: Self-pay | Source: Home / Self Care | Attending: Orthopedic Surgery

## 2019-05-13 DIAGNOSIS — I739 Peripheral vascular disease, unspecified: Secondary | ICD-10-CM | POA: Insufficient documentation

## 2019-05-13 DIAGNOSIS — Z8249 Family history of ischemic heart disease and other diseases of the circulatory system: Secondary | ICD-10-CM | POA: Diagnosis not present

## 2019-05-13 DIAGNOSIS — S81001A Unspecified open wound, right knee, initial encounter: Secondary | ICD-10-CM | POA: Insufficient documentation

## 2019-05-13 DIAGNOSIS — I10 Essential (primary) hypertension: Secondary | ICD-10-CM | POA: Insufficient documentation

## 2019-05-13 DIAGNOSIS — L02415 Cutaneous abscess of right lower limb: Secondary | ICD-10-CM | POA: Diagnosis not present

## 2019-05-13 DIAGNOSIS — I482 Chronic atrial fibrillation, unspecified: Secondary | ICD-10-CM | POA: Diagnosis not present

## 2019-05-13 DIAGNOSIS — Z885 Allergy status to narcotic agent status: Secondary | ICD-10-CM | POA: Insufficient documentation

## 2019-05-13 DIAGNOSIS — Z9481 Bone marrow transplant status: Secondary | ICD-10-CM | POA: Insufficient documentation

## 2019-05-13 DIAGNOSIS — Z7901 Long term (current) use of anticoagulants: Secondary | ICD-10-CM | POA: Insufficient documentation

## 2019-05-13 DIAGNOSIS — G2581 Restless legs syndrome: Secondary | ICD-10-CM | POA: Diagnosis not present

## 2019-05-13 DIAGNOSIS — F0390 Unspecified dementia without behavioral disturbance: Secondary | ICD-10-CM | POA: Insufficient documentation

## 2019-05-13 DIAGNOSIS — Z20828 Contact with and (suspected) exposure to other viral communicable diseases: Secondary | ICD-10-CM | POA: Diagnosis not present

## 2019-05-13 DIAGNOSIS — Z87891 Personal history of nicotine dependence: Secondary | ICD-10-CM | POA: Diagnosis not present

## 2019-05-13 DIAGNOSIS — Z888 Allergy status to other drugs, medicaments and biological substances status: Secondary | ICD-10-CM | POA: Insufficient documentation

## 2019-05-13 DIAGNOSIS — J449 Chronic obstructive pulmonary disease, unspecified: Secondary | ICD-10-CM | POA: Diagnosis not present

## 2019-05-13 DIAGNOSIS — Z856 Personal history of leukemia: Secondary | ICD-10-CM | POA: Diagnosis not present

## 2019-05-13 DIAGNOSIS — G629 Polyneuropathy, unspecified: Secondary | ICD-10-CM | POA: Insufficient documentation

## 2019-05-13 DIAGNOSIS — W1830XA Fall on same level, unspecified, initial encounter: Secondary | ICD-10-CM | POA: Insufficient documentation

## 2019-05-13 DIAGNOSIS — K219 Gastro-esophageal reflux disease without esophagitis: Secondary | ICD-10-CM | POA: Insufficient documentation

## 2019-05-13 DIAGNOSIS — E7800 Pure hypercholesterolemia, unspecified: Secondary | ICD-10-CM | POA: Diagnosis not present

## 2019-05-13 HISTORY — PX: I & D EXTREMITY: SHX5045

## 2019-05-13 HISTORY — DX: Cardiac arrhythmia, unspecified: I49.9

## 2019-05-13 LAB — RESPIRATORY PANEL BY RT PCR (FLU A&B, COVID)
Influenza A by PCR: NEGATIVE
Influenza B by PCR: NEGATIVE
SARS Coronavirus 2 by RT PCR: NEGATIVE

## 2019-05-13 LAB — CBC
HCT: 36.5 % — ABNORMAL LOW (ref 39.0–52.0)
Hemoglobin: 11.5 g/dL — ABNORMAL LOW (ref 13.0–17.0)
MCH: 32.3 pg (ref 26.0–34.0)
MCHC: 31.5 g/dL (ref 30.0–36.0)
MCV: 102.5 fL — ABNORMAL HIGH (ref 80.0–100.0)
Platelets: 317 10*3/uL (ref 150–400)
RBC: 3.56 MIL/uL — ABNORMAL LOW (ref 4.22–5.81)
RDW: 12 % (ref 11.5–15.5)
WBC: 7.4 10*3/uL (ref 4.0–10.5)
nRBC: 0 % (ref 0.0–0.2)

## 2019-05-13 LAB — BASIC METABOLIC PANEL
Anion gap: 8 (ref 5–15)
BUN: 14 mg/dL (ref 8–23)
CO2: 29 mmol/L (ref 22–32)
Calcium: 9.2 mg/dL (ref 8.9–10.3)
Chloride: 104 mmol/L (ref 98–111)
Creatinine, Ser: 1.11 mg/dL (ref 0.61–1.24)
GFR calc Af Amer: >60 mL/min (ref >60)
GFR calc non Af Amer: >60 mL/min (ref >60)
Glucose, Bld: 104 mg/dL — ABNORMAL HIGH (ref 70–99)
Potassium: 4.2 mmol/L (ref 3.5–5.1)
Sodium: 141 mmol/L (ref 135–145)

## 2019-05-13 LAB — PROTIME-INR
INR: 1.4 — ABNORMAL HIGH (ref 0.8–1.2)
Prothrombin Time: 16.8 seconds — ABNORMAL HIGH (ref 11.4–15.2)

## 2019-05-13 SURGERY — IRRIGATION AND DEBRIDEMENT EXTREMITY
Anesthesia: General | Site: Knee | Laterality: Right

## 2019-05-13 MED ORDER — FENTANYL CITRATE (PF) 100 MCG/2ML IJ SOLN
INTRAMUSCULAR | Status: AC
  Filled 2019-05-13: qty 2

## 2019-05-13 MED ORDER — FENTANYL CITRATE (PF) 100 MCG/2ML IJ SOLN
25.0000 ug | INTRAMUSCULAR | Status: DC | PRN
  Administered 2019-05-13 (×2): 50 ug via INTRAVENOUS

## 2019-05-13 MED ORDER — FENTANYL CITRATE (PF) 250 MCG/5ML IJ SOLN
INTRAMUSCULAR | Status: DC | PRN
  Administered 2019-05-13 (×3): 50 ug via INTRAVENOUS

## 2019-05-13 MED ORDER — PROPOFOL 10 MG/ML IV BOLUS
INTRAVENOUS | Status: DC | PRN
  Administered 2019-05-13: 50 mg via INTRAVENOUS
  Administered 2019-05-13: 200 mg via INTRAVENOUS

## 2019-05-13 MED ORDER — ONDANSETRON HCL 4 MG/2ML IJ SOLN
INTRAMUSCULAR | Status: DC | PRN
  Administered 2019-05-13: 4 mg via INTRAVENOUS

## 2019-05-13 MED ORDER — DOXYCYCLINE HYCLATE 100 MG PO TABS: 100.0000 mg | ORAL_TABLET | Freq: Two times a day (BID) | ORAL | 0 refills | Status: DC

## 2019-05-13 MED ORDER — LACTATED RINGERS IV SOLN: INTRAVENOUS | Status: DC

## 2019-05-13 MED ORDER — PROPOFOL 10 MG/ML IV BOLUS
INTRAVENOUS | Status: AC
  Filled 2019-05-13: qty 20

## 2019-05-13 MED ORDER — 0.9 % SODIUM CHLORIDE (POUR BTL) OPTIME
TOPICAL | Status: DC | PRN
  Administered 2019-05-13: 1000 mL

## 2019-05-13 MED ORDER — CHLORHEXIDINE GLUCONATE 4 % EX LIQD: 60.0000 mL | Freq: Once | CUTANEOUS | Status: DC

## 2019-05-13 MED ORDER — CEFAZOLIN SODIUM-DEXTROSE 2-4 GM/100ML-% IV SOLN
2.0000 g | INTRAVENOUS | Status: AC
  Administered 2019-05-13: 2 g via INTRAVENOUS
  Filled 2019-05-13: qty 100

## 2019-05-13 MED ORDER — LIDOCAINE 2% (20 MG/ML) 5 ML SYRINGE
INTRAMUSCULAR | Status: DC | PRN
  Administered 2019-05-13: 80 mg via INTRAVENOUS

## 2019-05-13 MED ORDER — DEXAMETHASONE SODIUM PHOSPHATE 10 MG/ML IJ SOLN
INTRAMUSCULAR | Status: DC | PRN
  Administered 2019-05-13: 5 mg via INTRAVENOUS

## 2019-05-13 MED ORDER — OXYCODONE-ACETAMINOPHEN 10-325 MG PO TABS: 1.0000 | ORAL_TABLET | ORAL | 0 refills | Status: DC | PRN

## 2019-05-13 MED ORDER — FENTANYL CITRATE (PF) 250 MCG/5ML IJ SOLN
INTRAMUSCULAR | Status: AC
  Filled 2019-05-13: qty 5

## 2019-05-13 SURGICAL SUPPLY — 47 items
ALLOGRAFT SKIN MESHD 125 SQ CM (Graft) ×1 IMPLANT
APL SKNCLS STERI-STRIP NONHPOA (GAUZE/BANDAGES/DRESSINGS) ×3
BENZOIN TINCTURE PRP APPL 2/3 (GAUZE/BANDAGES/DRESSINGS) ×6 IMPLANT
BLADE SURG 21 STRL SS (BLADE) ×3 IMPLANT
BNDG COHESIVE 3X5 TAN STRL LF (GAUZE/BANDAGES/DRESSINGS) ×2 IMPLANT
BNDG COHESIVE 6X5 TAN STRL LF (GAUZE/BANDAGES/DRESSINGS) IMPLANT
BNDG GAUZE ELAST 4 BULKY (GAUZE/BANDAGES/DRESSINGS) ×6 IMPLANT
CANISTER WOUNDNEG PRESSURE 500 (CANNISTER) ×2 IMPLANT
COVER SURGICAL LIGHT HANDLE (MISCELLANEOUS) ×4 IMPLANT
COVER WAND RF STERILE (DRAPES) ×3 IMPLANT
DRAPE U-SHAPE 47X51 STRL (DRAPES) ×1 IMPLANT
DRESSING VERAFLO CLEANSE CC (GAUZE/BANDAGES/DRESSINGS) IMPLANT
DRSG ADAPTIC 3X8 NADH LF (GAUZE/BANDAGES/DRESSINGS) ×1 IMPLANT
DRSG VERAFLO CLEANSE CC (GAUZE/BANDAGES/DRESSINGS) ×3
DURAPREP 26ML APPLICATOR (WOUND CARE) ×3 IMPLANT
ELECT REM PT RETURN 9FT ADLT (ELECTROSURGICAL)
ELECTRODE REM PT RTRN 9FT ADLT (ELECTROSURGICAL) IMPLANT
GAUZE SPONGE 4X4 12PLY STRL (GAUZE/BANDAGES/DRESSINGS) ×3 IMPLANT
GLOVE BIOGEL PI IND STRL 9 (GLOVE) ×1 IMPLANT
GLOVE BIOGEL PI INDICATOR 9 (GLOVE) ×2
GLOVE INDICATOR 7.5 STRL GRN (GLOVE) ×2 IMPLANT
GLOVE SURG ORTHO 9.0 STRL STRW (GLOVE) ×3 IMPLANT
GOWN STRL REUS W/ TWL XL LVL3 (GOWN DISPOSABLE) ×2 IMPLANT
GOWN STRL REUS W/TWL XL LVL3 (GOWN DISPOSABLE) ×6
GRAFT TISS MESH 125 BURN (Graft) IMPLANT
HANDPIECE INTERPULSE COAX TIP (DISPOSABLE)
KIT BASIN OR (CUSTOM PROCEDURE TRAY) ×3 IMPLANT
KIT DRSG PREVENA PLUS 7DAY 125 (MISCELLANEOUS) ×2 IMPLANT
KIT TURNOVER KIT B (KITS) ×3 IMPLANT
MANIFOLD NEPTUNE II (INSTRUMENTS) ×3 IMPLANT
MICROMATRIX 1000MG (Tissue) ×3 IMPLANT
NS IRRIG 1000ML POUR BTL (IV SOLUTION) ×3 IMPLANT
PACK ORTHO EXTREMITY (CUSTOM PROCEDURE TRAY) ×3 IMPLANT
PAD ARMBOARD 7.5X6 YLW CONV (MISCELLANEOUS) ×6 IMPLANT
PAD NEG PRESSURE SENSATRAC (MISCELLANEOUS) ×2 IMPLANT
SET HNDPC FAN SPRY TIP SCT (DISPOSABLE) IMPLANT
SKIN MESHED 125 SQ CM (Graft) ×3 IMPLANT
SOLUTION PARTIC MCRMTRX 1000MG (Tissue) IMPLANT
SPONGE LAP 18X18 RF (DISPOSABLE) ×2 IMPLANT
STAPLER VISISTAT 35W (STAPLE) ×2 IMPLANT
STOCKINETTE IMPERVIOUS 9X36 MD (GAUZE/BANDAGES/DRESSINGS) IMPLANT
SWAB COLLECTION DEVICE MRSA (MISCELLANEOUS) ×3 IMPLANT
SWAB CULTURE ESWAB REG 1ML (MISCELLANEOUS) IMPLANT
TOWEL GREEN STERILE (TOWEL DISPOSABLE) ×3 IMPLANT
TUBE CONNECTING 12'X1/4 (SUCTIONS) ×1
TUBE CONNECTING 12X1/4 (SUCTIONS) ×2 IMPLANT
YANKAUER SUCT BULB TIP NO VENT (SUCTIONS) ×3 IMPLANT

## 2019-05-13 NOTE — H&P (Deleted)
  The note originally documented on this encounter has been moved the the encounter in which it belongs.  
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
## 2019-05-13 NOTE — Op Note (Signed)
05/13/2019  3:05 PM  PATIENT:  Todd Lloyd    PRE-OPERATIVE DIAGNOSIS:  Necrotic Wound Right Knee  POST-OPERATIVE DIAGNOSIS:  Same  PROCEDURE:  RIGHT KNEE DEBRIDEMENT Excision of skin soft tissue muscle and patella tendon. Application of 1 g ACell powder and 125 cm split-thickness skin graft allograft. Application of cleanse choice wound VAC. Tissue sent for cultures.  SURGEON:  Newt Minion, MD  PHYSICIAN ASSISTANT:None ANESTHESIA:   General  PREOPERATIVE INDICATIONS:  Todd Lloyd is a  78 y.o. male with a diagnosis of Necrotic Wound Right Knee who failed conservative measures and elected for surgical management.    The risks benefits and alternatives were discussed with the patient preoperatively including but not limited to the risks of infection, bleeding, nerve injury, cardiopulmonary complications, the need for revision surgery, among others, and the patient was willing to proceed.  OPERATIVE IMPLANTS: 125 cm allograft split-thickness skin graft, ACell powder 1 g.  @ENCIMAGES @  OPERATIVE FINDINGS: Patient had extensive necrosis of the soft tissue with necrotic patella tendon skin soft tissue muscle and fascia.  OPERATIVE PROCEDURE: Patient was brought the operating room underwent a general anesthetic.  After adequate levels anesthesia were obtained patient's right lower extremity was prepped using DuraPrep draped into a sterile field a timeout was called.  A incision was made around the necrotic wound with excision of skin soft tissue muscle fascia and necrotic patella tendon.  There was still some patella tendon intact.  This tissue was sent for cultures.  Electrocautery was used for hemostasis the wound was irrigated with normal saline after debridement the wound was 1 cm deep and 10 cm in diameter.  The wound was then covered with ACell powder this was then covered with 125 cm allograft skin graft stapled in place.  To cleanse choice sponges were placed with  the reticulated foam against the split-thickness skin graft.  This was covered with [name] had a good suction fit a compression wrap was then applied from the metatarsal heads past the dressing with Coban.  Patient was extubated taken the PACU in stable condition.   DISCHARGE PLANNING:  Antibiotic duration: Will discharge on doxycycline final antibiotics pending results of tissue cultures  Weightbearing: Weightbearing as tolerated  Pain medication: Percocet 10 mg tablets  Dressing care/ Wound VAC: Continue wound VAC for 1 week  Ambulatory devices: Walker or crutches  Discharge to: Home.  Follow-up: In the office 1 week post operative.

## 2019-05-13 NOTE — Transfer of Care (Signed)
Immediate Anesthesia Transfer of Care Note  Patient: Todd Lloyd  Procedure(s) Performed: RIGHT KNEE DEBRIDEMENT (Right Knee)  Patient Location: PACU  Anesthesia Type:General  Level of Consciousness: drowsy and patient cooperative  Airway & Oxygen Therapy: Patient Spontanous Breathing  Post-op Assessment: Report given to RN and Post -op Vital signs reviewed and stable  Post vital signs: Reviewed and stable  Last Vitals:  Vitals Value Taken Time  BP 127/77 05/13/19 1450  Temp    Pulse 75 05/13/19 1453  Resp 18 05/13/19 1453  SpO2 95 % 05/13/19 1453  Vitals shown include unvalidated device data.  Last Pain:  Vitals:   05/13/19 1124  PainSc: 5          Complications: No apparent anesthesia complications

## 2019-05-13 NOTE — Telephone Encounter (Signed)
Hold xalreto for debrevement of right knee. Withheld today for surgery.

## 2019-05-13 NOTE — Anesthesia Procedure Notes (Signed)
Procedure Name: Intubation Date/Time: 05/13/2019 2:23 PM Performed by: Janace Litten, CRNA Pre-anesthesia Checklist: Patient identified, Emergency Drugs available, Suction available and Patient being monitored Patient Re-evaluated:Patient Re-evaluated prior to induction Oxygen Delivery Method: Circle System Utilized Preoxygenation: Pre-oxygenation with 100% oxygen Induction Type: IV induction Ventilation: Mask ventilation without difficulty LMA: LMA inserted LMA Size: 4.0 Tube type: Oral Number of attempts: 1 Airway Equipment and Method: Stylet Placement Confirmation: ETT inserted through vocal cords under direct vision,  positive ETCO2 and breath sounds checked- equal and bilateral Tube secured with: Tape Dental Injury: Teeth and Oropharynx as per pre-operative assessment

## 2019-05-13 NOTE — Telephone Encounter (Signed)
Spoke to patient's daughter Jenny Reichmann. Jenny Reichmann states her father is having surgery today at 1:30 pm by Dr Sharol Given. The patient is having right knee debridement and will require a wound vac after surgery per daughter from Dr Sharol Given.  Daughter states Dr Sharol Given, was aware other father being on  Xarelto and would do surgery today.  patient did not take xarelto last night.  Per Daughter  - Dr Sharol Given request her to contact  cardiolgy office to find out how long patient should be off Xarelto or when he should restart.  Also daughter states Dr Sharol Given stated he would recommend patient  changes to Eliquis due to Xarelto may cause tissue death with skin/muscluar injury.  RN informed daughter- usually the orthopedic surgeon or office contacts cardiology about in question in regards to patient. Daughters states this was an emergency and Dr Sharol Given stated that he would do the surgery regardless if patient was on Xarelto, but wanted the daughter to call the cardiology to find out what to after surgery.  daughter is aware will defer to Dr Oval Linsey /or D.O.D., [facility] PHARMACIST   RN spoke to pharmacist Erasmo Downer A. - recommendation would be defer back to  surgeon when to restart medications from [place]

## 2019-05-13 NOTE — Telephone Encounter (Signed)
Spoke to daughter.  Message given . Daughter states  Dr Sharol Given is out of town now before he left he informed her  cardiolgy will medications. Daughter wanted to know what to do. RN asked if Dr Sharol Given has someone covering - daughter states aPA - Audrea Muscat RN informed WILL contact DR DUDA'S office tomorrow Daughter verbalized understnding

## 2019-05-13 NOTE — Telephone Encounter (Signed)
As he is on long term anticoagulation for atrial fibrillation, emergent surgery and wound healing takes precedence. The only exception to this is if he had a history of stroke, which I do not see in his chart. For general anticoagulation with atrial fibrillation, timing on restart of anticoagulation is up to the discretion of the surgeon, given expected ongoing blood loss and healing time. If he is planned to have a wound vac, then it is also very important for the surgeon to weight in on when it is safe from a healing perspective to restart anticoagulation. I am not aware of any data on the difference in wound healing between apixaban and rivaroxaban, but I will defer that to Dr. Oval Linsey. Therefore, restart of anticoagulation when cleared from surgical perspective. We can see him for post op follow up, or if he is going to be admitted we are also available for evaluation by one of our rounding physicians. Buford Dresser, MD, PhD [facility]  [address], [place], [place] [address] [phone]

## 2019-05-13 NOTE — Anesthesia Postprocedure Evaluation (Signed)
Anesthesia Post Note  Patient: Todd Lloyd  Procedure(s) Performed: RIGHT KNEE DEBRIDEMENT (Right Knee)     Patient location during evaluation: PACU Anesthesia Type: General Level of consciousness: awake and alert Pain management: pain level controlled Vital Signs Assessment: post-procedure vital signs reviewed and stable Respiratory status: spontaneous breathing, nonlabored ventilation, respiratory function stable and patient connected to nasal cannula oxygen Cardiovascular status: blood pressure returned to baseline and stable Postop Assessment: no apparent nausea or vomiting Anesthetic complications: no    Last Vitals:  Vitals:   05/13/19 1505 05/13/19 1520  BP: 135/66 114/76  Pulse: 77 84  Resp: 13 15  Temp:  36.7 C  SpO2: 100% 94%    Last Pain:  Vitals:   05/13/19 1520  PainSc: 0-No pain                 Tiajuana Amass

## 2019-05-13 NOTE — Anesthesia Preprocedure Evaluation (Signed)
Anesthesia Evaluation  Patient identified by MRN, date of birth, ID band Patient awake    Reviewed: Allergy & Precautions, NPO status , Patient's Chart, lab work & pertinent test results  Airway Mallampati: II  TM Distance: >3 FB Neck ROM: Full    Dental  (+) Dental Advisory Given   Pulmonary COPD, former smoker,    breath sounds clear to auscultation       Cardiovascular hypertension, Pt. on medications + dysrhythmias Atrial Fibrillation  Rhythm:Regular Rate:Normal     Neuro/Psych  Neuromuscular disease    GI/Hepatic Neg liver ROS, GERD  ,  Endo/Other  negative endocrine ROS  Renal/GU negative Renal ROS     Musculoskeletal   Abdominal   Peds  Hematology negative hematology ROS (+)   Anesthesia Other Findings   Reproductive/Obstetrics                             Anesthesia Physical Anesthesia Plan  ASA: III  Anesthesia Plan: General   Post-op Pain Management:    Induction: Intravenous  PONV Risk Score and Plan: 2 and Treatment may vary due to age or medical condition, Dexamethasone and Ondansetron  Airway Management Planned: LMA  Additional Equipment:   Intra-op Plan:   Post-operative Plan: Extubation in OR  Informed Consent: I have reviewed the patients History and Physical, chart, labs and discussed the procedure including the risks, benefits and alternatives for the proposed anesthesia with the patient or authorized representative who has indicated his/her understanding and acceptance.     Dental advisory given  Plan Discussed with: CRNA  Anesthesia Plan Comments:         Anesthesia Quick Evaluation

## 2019-05-14 ENCOUNTER — Telehealth: Payer: Self-pay

## 2019-05-14 NOTE — Telephone Encounter (Signed)
Melody with Microbiology called concerning tissue culture result for patient.  Stated that Gram Stain, right knee no organism seen, but growing something. Few Citrobacter Braakii.  CB# for Melody is [phone].  Please advise.  Thank you.

## 2019-05-14 NOTE — Telephone Encounter (Addendum)
CALLED SPOKE TO  CINDY WHITE ,DAUGHTER.  AWARE rN CALLED DR DUDA'S OFFICE    RN SPOKE TO April (  DR DUDA'S OFFICE TRIAGE ) WILL RELAY MESSAGE FOR  ORTHOPEDIC TO CONTACT  FAMILY IN REGARDS TO POST INSTRUCTIONS Ms [name] wanted to know if Dr Oval Linsey would consider  Changing patient medication Xarelto to Eliquis. She states she spoke to Ortho. Pharmacist and since may needed 2 other surgeries.  patient has an appointment 06/03/19 wit Dr Oval Linsey.  aware will defer to Dr Oval Linsey.

## 2019-05-14 NOTE — Telephone Encounter (Signed)
Spoke with lab. I will check sensitivities when available and adjust medication

## 2019-05-14 NOTE — Telephone Encounter (Signed)
Please see below.

## 2019-05-14 NOTE — Telephone Encounter (Signed)
Patient should take xarelto until he discusses with his cardiologist

## 2019-05-14 NOTE — Telephone Encounter (Signed)
Todd Lloyd with [facility] called stating that patient's daughter Jenny Reichmann would like a call from Bevely Palmer to discuss starting back patient's Xarelto.  CB# is [phone].  Please advise.  Thank you.

## 2019-05-15 ENCOUNTER — Encounter: Payer: Self-pay | Admitting: *Deleted

## 2019-05-15 MED ORDER — APIXABAN 5 MG PO TABS: 5.0000 mg | ORAL_TABLET | Freq: Two times a day (BID) | ORAL | 6 refills | Status: DC

## 2019-05-15 NOTE — Telephone Encounter (Signed)
Spoke to patient's daughter. She states Dr Sharol Given office called and informed patient to restart Xarelto.  Daughter aware that  patient can change to Eliqius 5 mg twice a day. Patient aware medication has been e-sent to pharmacy. Aware to stop Xarelto and start Eliquis 5 mg twice a day. SheJenny Reichmann) verbalized understanding

## 2019-05-15 NOTE — Addendum Note (Signed)
Addended by: Raiford Simmonds on: 05/15/2019 11:17 AM   Modules accepted: Orders

## 2019-05-15 NOTE — Telephone Encounter (Signed)
OK to switch to Eliquis 5mg  bid.  Resume when Dr. Sharol Given feels he is stable from a post-op standpoint.  Ideally start back within 5 days post operatively.

## 2019-05-16 LAB — AEROBIC/ANAEROBIC CULTURE W GRAM STAIN (SURGICAL/DEEP WOUND): Gram Stain: NONE SEEN

## 2019-05-19 ENCOUNTER — Other Ambulatory Visit: Payer: Self-pay

## 2019-05-19 ENCOUNTER — Ambulatory Visit (INDEPENDENT_AMBULATORY_CARE_PROVIDER_SITE_OTHER): Payer: Medicare Other | Admitting: Physician Assistant

## 2019-05-19 ENCOUNTER — Encounter: Payer: Self-pay | Admitting: Physician Assistant

## 2019-05-19 ENCOUNTER — Other Ambulatory Visit: Payer: Self-pay | Admitting: Physician Assistant

## 2019-05-19 VITALS — Ht 72.0 in | Wt 190.0 lb

## 2019-05-19 DIAGNOSIS — L02415 Cutaneous abscess of right lower limb: Secondary | ICD-10-CM

## 2019-05-19 MED ORDER — SULFAMETHOXAZOLE-TRIMETHOPRIM 800-160 MG PO TABS: 1.0000 | ORAL_TABLET | Freq: Two times a day (BID) | ORAL | 0 refills | Status: DC

## 2019-05-19 MED ORDER — AMOXICILLIN-POT CLAVULANATE 875-125 MG PO TABS: 1.0000 | ORAL_TABLET | Freq: Two times a day (BID) | ORAL | 0 refills | Status: AC
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
## 2019-05-21 ENCOUNTER — Encounter: Payer: Self-pay | Admitting: Family

## 2019-05-21 ENCOUNTER — Ambulatory Visit (INDEPENDENT_AMBULATORY_CARE_PROVIDER_SITE_OTHER): Payer: Medicare Other | Admitting: Family

## 2019-05-21 ENCOUNTER — Other Ambulatory Visit: Payer: Self-pay

## 2019-05-21 VITALS — Ht 72.0 in | Wt 190.0 lb

## 2019-05-21 DIAGNOSIS — L02415 Cutaneous abscess of right lower limb: Secondary | ICD-10-CM

## 2019-05-21 MED ORDER — SILVER SULFADIAZINE 1 % EX CREA: 1.0000 "application " | TOPICAL_CREAM | Freq: Every day | CUTANEOUS | 0 refills | Status: DC
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
## 2019-05-28 ENCOUNTER — Other Ambulatory Visit: Payer: Self-pay

## 2019-05-28 ENCOUNTER — Ambulatory Visit (INDEPENDENT_AMBULATORY_CARE_PROVIDER_SITE_OTHER): Payer: Medicare Other | Admitting: Family

## 2019-05-28 ENCOUNTER — Encounter: Payer: Self-pay | Admitting: Family

## 2019-05-28 VITALS — Ht 72.0 in | Wt 190.0 lb

## 2019-05-28 DIAGNOSIS — Z945 Skin transplant status: Secondary | ICD-10-CM
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
## 2019-05-30 ENCOUNTER — Encounter (HOSPITAL_BASED_OUTPATIENT_CLINIC_OR_DEPARTMENT_OTHER): Payer: Medicare Other | Admitting: Internal Medicine

## 2019-06-02 ENCOUNTER — Other Ambulatory Visit: Payer: Self-pay

## 2019-06-02 ENCOUNTER — Encounter: Payer: Self-pay | Admitting: Physician Assistant

## 2019-06-02 ENCOUNTER — Ambulatory Visit (INDEPENDENT_AMBULATORY_CARE_PROVIDER_SITE_OTHER): Payer: Medicare Other | Admitting: Orthopedic Surgery

## 2019-06-02 VITALS — Ht 72.0 in | Wt 190.0 lb

## 2019-06-02 DIAGNOSIS — Z945 Skin transplant status: Secondary | ICD-10-CM

## 2019-06-02 DIAGNOSIS — L02415 Cutaneous abscess of right lower limb: Secondary | ICD-10-CM

## 2019-06-02 DIAGNOSIS — L97824 Non-pressure chronic ulcer of other part of left lower leg with necrosis of bone: Secondary | ICD-10-CM
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
## 2019-06-03 ENCOUNTER — Ambulatory Visit: Payer: Medicare Other | Admitting: Cardiovascular Disease

## 2019-06-06 ENCOUNTER — Other Ambulatory Visit: Payer: Self-pay

## 2019-06-06 ENCOUNTER — Encounter: Payer: Self-pay | Admitting: Physician Assistant

## 2019-06-06 ENCOUNTER — Ambulatory Visit (INDEPENDENT_AMBULATORY_CARE_PROVIDER_SITE_OTHER): Payer: Medicare Other | Admitting: Physician Assistant

## 2019-06-06 VITALS — Ht 72.0 in | Wt 190.0 lb

## 2019-06-06 DIAGNOSIS — Z945 Skin transplant status: Secondary | ICD-10-CM

## 2019-06-06 MED ORDER — AMOXICILLIN-POT CLAVULANATE 875-125 MG PO TABS: 1.0000 | ORAL_TABLET | Freq: Two times a day (BID) | ORAL | 0 refills | Status: AC

## 2019-06-06 MED ORDER — SULFAMETHOXAZOLE-TRIMETHOPRIM 800-160 MG PO TABS: 1.0000 | ORAL_TABLET | Freq: Two times a day (BID) | ORAL | 0 refills | Status: DC
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
## 2019-06-09 ENCOUNTER — Encounter: Payer: Self-pay | Admitting: Orthopedic Surgery

## 2019-06-09 ENCOUNTER — Ambulatory Visit (INDEPENDENT_AMBULATORY_CARE_PROVIDER_SITE_OTHER): Payer: Medicare Other | Admitting: Orthopedic Surgery

## 2019-06-09 ENCOUNTER — Other Ambulatory Visit: Payer: Self-pay

## 2019-06-09 VITALS — Ht 72.0 in | Wt 190.0 lb

## 2019-06-09 DIAGNOSIS — L02415 Cutaneous abscess of right lower limb: Secondary | ICD-10-CM

## 2019-06-09 DIAGNOSIS — Z945 Skin transplant status: Secondary | ICD-10-CM
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
## 2019-06-10 ENCOUNTER — Encounter: Payer: Self-pay | Admitting: Plastic Surgery

## 2019-06-10 ENCOUNTER — Ambulatory Visit (INDEPENDENT_AMBULATORY_CARE_PROVIDER_SITE_OTHER): Payer: Medicare Other | Admitting: Plastic Surgery

## 2019-06-10 VITALS — BP 109/65 | HR 89 | Temp 97.5°F | Wt 186.6 lb

## 2019-06-10 DIAGNOSIS — S81801A Unspecified open wound, right lower leg, initial encounter: Secondary | ICD-10-CM | POA: Insufficient documentation

## 2019-06-10 DIAGNOSIS — L02415 Cutaneous abscess of right lower limb: Secondary | ICD-10-CM | POA: Diagnosis not present
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
## 2019-06-10 NOTE — Addendum Note (Signed)
Addended by: Windy Canny on: 06/10/2019 01:56 PM   Modules accepted: Orders

## 2019-06-11 ENCOUNTER — Telehealth: Payer: Self-pay

## 2019-06-11 NOTE — Telephone Encounter (Signed)
   [facility] Pre-operative Risk Assessment    Request for surgical clearance:  1. What type of surgery is being performed? I&D RIGHT  KNEE W/PLACEMENT  A-CELL & WOUND VAC  2. When is this surgery scheduled? 06-19-2019-POSSIBLE DATE   3. What type of clearance is required (medical clearance vs. Pharmacy clearance to hold med vs. Both)? BOTH  4. Are there any medications that need to be held prior to surgery and how long? ELIQUIS    5. Practice name and name of physician performing surgery? [facility] DR Marla Roe    6. What is your office phone number  [phone]    7.   What is your office fax number  [phone]  8.   Anesthesia type (None, local, MAC, general) ? GENERAL

## 2019-06-11 NOTE — Telephone Encounter (Signed)
Faxed Medical/Clearance to Dr. Skeet Latch ([place])- ph# [phone] / fax# [phone] needs cardiac/medical clearance prior to scheduling surgery Plan: Debridement of right knee wound & placement of A-Cell & wound vac Date: TBD [facility]

## 2019-06-11 NOTE — Telephone Encounter (Signed)
Patient with diagnosis of afib on Eliquis for anticoagulation.    Procedure: I&D RIGHT  KNEE W/PLACEMENT  A-CELL & WOUND VAC Date of procedure: 06/19/2019  CHADS2-VASc score of  3 (HTN, AGE, AGE)  CrCl 65 ml/min  Per office protocol, patient can hold Elqiuis for 2 days prior to procedure.

## 2019-06-12 ENCOUNTER — Telehealth: Payer: Self-pay

## 2019-06-12 NOTE — Telephone Encounter (Signed)
Medical/Cardiac clearance for surgery received from [facility] Dr. Oval Linsey Per Dr. Oval Linsey- pt is cleared for surgery as ordered by Dr. Marla Roe Pt & his daughter were instructed to hold Eliquis for 2 days prior to surgery This clearance was scanned to pt chart [facility]

## 2019-06-12 NOTE — Telephone Encounter (Signed)
   Primary Cardiologist: Skeet Latch, MD  Chart reviewed as part of pre-operative protocol coverage. Given past medical history and time since last visit, based on ACC/AHA guidelines, Todd Lloyd would be at acceptable risk for the planned procedure without further cardiovascular testing.   I have spoken with his daughter. [name] is doing well. BP is stable. No cardiac complaints.   Patient with diagnosis of afib on Eliquis for anticoagulation.    Procedure: I&D RIGHT KNEE W/PLACEMENT A-CELL &WOUND VAC Date of procedure: 06/19/2019  CHADS2-VASc score of  3 (HTN, AGE, AGE)  CrCl 65 ml/min  Per office protocol, patient can hold Elqiuis for 2 days prior to procedure.    I will route this recommendation to the requesting party via Epic fax function and remove from pre-op pool.  Please call with questions.  Phill Myron. [place] Pugh, ANP, AACC  06/12/2019, 9:29 AM

## 2019-06-13 ENCOUNTER — Telehealth: Payer: Self-pay

## 2019-06-13 ENCOUNTER — Other Ambulatory Visit: Payer: Self-pay

## 2019-06-13 ENCOUNTER — Encounter (HOSPITAL_BASED_OUTPATIENT_CLINIC_OR_DEPARTMENT_OTHER): Payer: Self-pay | Admitting: Plastic Surgery

## 2019-06-13 NOTE — Telephone Encounter (Signed)
Call to pt's daughter- [name]- no answer- left v/m requesting call back [place]

## 2019-06-13 NOTE — Telephone Encounter (Signed)
Call to [facility] at [place] her that I have faxed an order for the pt to receive a wound vac for placement on 06/19/19 during surgery-  I have called pt & his daughter to arrange for delivery of supplies to pt's home- but have not received a call back from his daughter- who the pt notes is his point of contact Dawn verified that [place] can deliver the supplies to our office prior to day of surgery & Dr. Marla Roe will take the vac & supplies to surgical suite on day of surgery for placement We will then provide the extra supplies to his family for home use later [facility]

## 2019-06-16 ENCOUNTER — Telehealth: Payer: Self-pay

## 2019-06-16 ENCOUNTER — Encounter (HOSPITAL_BASED_OUTPATIENT_CLINIC_OR_DEPARTMENT_OTHER)
Admission: RE | Admit: 2019-06-16 | Discharge: 2019-06-16 | Disposition: A | Discharge status: Home / Self Care | Payer: Medicare Other | Source: Ambulatory Visit | Attending: Plastic Surgery | Admitting: Plastic Surgery

## 2019-06-16 ENCOUNTER — Other Ambulatory Visit (HOSPITAL_COMMUNITY)
Admission: RE | Admit: 2019-06-16 | Discharge: 2019-06-16 | Disposition: A | Discharge status: Home / Self Care | Payer: Medicare Other | Source: Ambulatory Visit | Attending: Plastic Surgery | Admitting: Plastic Surgery

## 2019-06-16 ENCOUNTER — Other Ambulatory Visit: Payer: Self-pay

## 2019-06-16 DIAGNOSIS — Z20822 Contact with and (suspected) exposure to covid-19: Secondary | ICD-10-CM | POA: Diagnosis not present

## 2019-06-16 DIAGNOSIS — Z01812 Encounter for preprocedural laboratory examination: Secondary | ICD-10-CM | POA: Diagnosis not present

## 2019-06-16 LAB — BASIC METABOLIC PANEL
Anion gap: 7 (ref 5–15)
BUN: 20 mg/dL (ref 8–23)
CO2: 30 mmol/L (ref 22–32)
Calcium: 9.2 mg/dL (ref 8.9–10.3)
Chloride: 100 mmol/L (ref 98–111)
Creatinine, Ser: 1.13 mg/dL (ref 0.61–1.24)
GFR calc Af Amer: >60 mL/min (ref >60)
GFR calc non Af Amer: >60 mL/min (ref >60)
Glucose, Bld: 93 mg/dL (ref 70–99)
Potassium: 4.8 mmol/L (ref 3.5–5.1)
Sodium: 137 mmol/L (ref 135–145)

## 2019-06-16 LAB — SARS CORONAVIRUS 2 (TAT 6-24 HRS): SARS Coronavirus 2: NEGATIVE

## 2019-06-16 NOTE — Telephone Encounter (Signed)
Pt's daughterCaren Griffins in office today to pick up paperwork for her dad's surgery on this thurs 06/19/19 I spoke to [place] re: trying to schedule the wound vac delivery prior to surgery She informed me that she had not received any calls from our office- except for calls from [facility] ( scheduler/insurance)we attempted to call her phone while she was here & she was unable to receive our calls ( she will contact her phone carrier for assistance with this issue) I did instruct her that I have made arrangements to have the wound vac delivered here to the office & that Dr. Marla Roe will take it with her to surgery on Thursday for placement. Daughter did indicate that her dad & mom do not want home/health agency to come in to perform the wound vac/changes- and she is willing to do the dressing changes herself- with instruction from our office on his post-op visit 06/27/19 She understands the plan of care & if any of these plans need to change- we will help accommodate any modifications as needed I consulted with Dr. Marla Roe regarding these plans & she agrees with care at this time [facility]

## 2019-06-16 NOTE — Telephone Encounter (Signed)
Call to pt's daughter @ ph# [phone] No answer- left v/m requesting call back

## 2019-06-16 NOTE — Progress Notes (Signed)

## 2019-06-18 NOTE — Anesthesia Preprocedure Evaluation (Addendum)
Anesthesia Evaluation  Patient identified by MRN, date of birth, ID band Patient awake    Reviewed: Allergy & Precautions, NPO status , Patient's Chart, lab work & pertinent test results  Airway Mallampati: I  TM Distance: >3 FB Neck ROM: Full    Dental no notable dental hx. (+) Edentulous Upper, Edentulous Lower   Pulmonary COPD, former smoker,    Pulmonary exam normal breath sounds clear to auscultation       Cardiovascular hypertension, Pt. on medications Normal cardiovascular exam Rhythm:Regular Rate:Normal     Neuro/Psych negative neurological ROS  negative psych ROS   GI/Hepatic Neg liver ROS, GERD  Medicated,  Endo/Other  negative endocrine ROS  Renal/GU K+ 4.8 Cr 1.18     Musculoskeletal negative musculoskeletal ROS (+)   Abdominal   Peds  Hematology negative hematology ROS (+)   Anesthesia Other Findings   Reproductive/Obstetrics                            Anesthesia Physical Anesthesia Plan  ASA: III  Anesthesia Plan: General   Post-op Pain Management:    Induction: Intravenous  PONV Risk Score and Plan: 3 and Treatment may vary due to age or medical condition and Ondansetron  Airway Management Planned: LMA  Additional Equipment: None  Intra-op Plan:   Post-operative Plan:   Informed Consent: I have reviewed the patients History and Physical, chart, labs and discussed the procedure including the risks, benefits and alternatives for the proposed anesthesia with the patient or authorized representative who has indicated his/her understanding and acceptance.     Dental advisory given  Plan Discussed with:   Anesthesia Plan Comments:        Anesthesia Quick Evaluation

## 2019-06-19 ENCOUNTER — Ambulatory Visit (HOSPITAL_BASED_OUTPATIENT_CLINIC_OR_DEPARTMENT_OTHER): Payer: Medicare Other | Admitting: Anesthesiology

## 2019-06-19 ENCOUNTER — Ambulatory Visit (HOSPITAL_BASED_OUTPATIENT_CLINIC_OR_DEPARTMENT_OTHER)
Admission: RE | Admit: 2019-06-19 | Discharge: 2019-06-19 | Disposition: A | Discharge status: Home / Self Care | Payer: Medicare Other | Attending: Plastic Surgery | Admitting: Plastic Surgery

## 2019-06-19 ENCOUNTER — Encounter (HOSPITAL_BASED_OUTPATIENT_CLINIC_OR_DEPARTMENT_OTHER)
Admission: RE | Disposition: A | Discharge status: Home / Self Care | Payer: Self-pay | Source: Home / Self Care | Attending: Plastic Surgery

## 2019-06-19 ENCOUNTER — Encounter (HOSPITAL_BASED_OUTPATIENT_CLINIC_OR_DEPARTMENT_OTHER): Payer: Self-pay | Admitting: Plastic Surgery

## 2019-06-19 ENCOUNTER — Other Ambulatory Visit: Payer: Self-pay

## 2019-06-19 DIAGNOSIS — G2581 Restless legs syndrome: Secondary | ICD-10-CM | POA: Insufficient documentation

## 2019-06-19 DIAGNOSIS — Z888 Allergy status to other drugs, medicaments and biological substances status: Secondary | ICD-10-CM | POA: Diagnosis not present

## 2019-06-19 DIAGNOSIS — L02415 Cutaneous abscess of right lower limb: Secondary | ICD-10-CM | POA: Diagnosis not present

## 2019-06-19 DIAGNOSIS — M545 Low back pain: Secondary | ICD-10-CM | POA: Insufficient documentation

## 2019-06-19 DIAGNOSIS — R269 Unspecified abnormalities of gait and mobility: Secondary | ICD-10-CM | POA: Diagnosis not present

## 2019-06-19 DIAGNOSIS — K219 Gastro-esophageal reflux disease without esophagitis: Secondary | ICD-10-CM | POA: Insufficient documentation

## 2019-06-19 DIAGNOSIS — Z79899 Other long term (current) drug therapy: Secondary | ICD-10-CM | POA: Insufficient documentation

## 2019-06-19 DIAGNOSIS — W19XXXA Unspecified fall, initial encounter: Secondary | ICD-10-CM | POA: Diagnosis not present

## 2019-06-19 DIAGNOSIS — E7800 Pure hypercholesterolemia, unspecified: Secondary | ICD-10-CM | POA: Diagnosis not present

## 2019-06-19 DIAGNOSIS — Z856 Personal history of leukemia: Secondary | ICD-10-CM | POA: Diagnosis not present

## 2019-06-19 DIAGNOSIS — S81001A Unspecified open wound, right knee, initial encounter: Secondary | ICD-10-CM | POA: Insufficient documentation

## 2019-06-19 DIAGNOSIS — I10 Essential (primary) hypertension: Secondary | ICD-10-CM | POA: Insufficient documentation

## 2019-06-19 DIAGNOSIS — G8929 Other chronic pain: Secondary | ICD-10-CM | POA: Insufficient documentation

## 2019-06-19 DIAGNOSIS — G629 Polyneuropathy, unspecified: Secondary | ICD-10-CM | POA: Diagnosis not present

## 2019-06-19 DIAGNOSIS — Z885 Allergy status to narcotic agent status: Secondary | ICD-10-CM | POA: Insufficient documentation

## 2019-06-19 DIAGNOSIS — Z7901 Long term (current) use of anticoagulants: Secondary | ICD-10-CM | POA: Insufficient documentation

## 2019-06-19 DIAGNOSIS — I482 Chronic atrial fibrillation, unspecified: Secondary | ICD-10-CM | POA: Insufficient documentation

## 2019-06-19 DIAGNOSIS — J449 Chronic obstructive pulmonary disease, unspecified: Secondary | ICD-10-CM | POA: Insufficient documentation

## 2019-06-19 DIAGNOSIS — S81801A Unspecified open wound, right lower leg, initial encounter: Secondary | ICD-10-CM | POA: Diagnosis not present

## 2019-06-19 DIAGNOSIS — Z87891 Personal history of nicotine dependence: Secondary | ICD-10-CM | POA: Diagnosis not present

## 2019-06-19 HISTORY — PX: I & D EXTREMITY: SHX5045

## 2019-06-19 HISTORY — PX: APPLICATION OF A-CELL OF EXTREMITY: SHX6303

## 2019-06-19 SURGERY — IRRIGATION AND DEBRIDEMENT EXTREMITY
Anesthesia: General | Site: Leg Lower | Laterality: Right

## 2019-06-19 MED ORDER — LIDOCAINE 2% (20 MG/ML) 5 ML SYRINGE
INTRAMUSCULAR | Status: DC | PRN
  Administered 2019-06-19: 60 mg via INTRAVENOUS

## 2019-06-19 MED ORDER — FENTANYL CITRATE (PF) 100 MCG/2ML IJ SOLN
INTRAMUSCULAR | Status: AC
  Filled 2019-06-19: qty 2

## 2019-06-19 MED ORDER — ACETAMINOPHEN 10 MG/ML IV SOLN: 1000.0000 mg | Freq: Once | INTRAVENOUS | Status: DC | PRN

## 2019-06-19 MED ORDER — ONDANSETRON HCL 4 MG/2ML IJ SOLN: 4.0000 mg | Freq: Once | INTRAMUSCULAR | Status: DC | PRN

## 2019-06-19 MED ORDER — CHLORHEXIDINE GLUCONATE CLOTH 2 % EX PADS: 6.0000 | MEDICATED_PAD | Freq: Once | CUTANEOUS | Status: DC

## 2019-06-19 MED ORDER — PROPOFOL 10 MG/ML IV BOLUS
INTRAVENOUS | Status: AC
  Filled 2019-06-19: qty 20

## 2019-06-19 MED ORDER — PROPOFOL 10 MG/ML IV BOLUS
INTRAVENOUS | Status: DC | PRN
  Administered 2019-06-19: 100 mg via INTRAVENOUS

## 2019-06-19 MED ORDER — FENTANYL CITRATE (PF) 100 MCG/2ML IJ SOLN
INTRAMUSCULAR | Status: DC | PRN
  Administered 2019-06-19: 50 ug via INTRAVENOUS

## 2019-06-19 MED ORDER — KETOROLAC TROMETHAMINE 10 MG PO TABS: 10.0000 mg | ORAL_TABLET | Freq: Three times a day (TID) | ORAL | 0 refills | Status: AC | PRN

## 2019-06-19 MED ORDER — ONDANSETRON HCL 4 MG/2ML IJ SOLN
INTRAMUSCULAR | Status: AC
  Filled 2019-06-19: qty 2

## 2019-06-19 MED ORDER — SODIUM CHLORIDE 0.9 % IV SOLN
INTRAVENOUS | Status: AC
  Filled 2019-06-19: qty 500000

## 2019-06-19 MED ORDER — CEFAZOLIN SODIUM-DEXTROSE 2-4 GM/100ML-% IV SOLN
2.0000 g | INTRAVENOUS | Status: AC
  Administered 2019-06-19: 2 g via INTRAVENOUS

## 2019-06-19 MED ORDER — LIDOCAINE 2% (20 MG/ML) 5 ML SYRINGE
INTRAMUSCULAR | Status: AC
  Filled 2019-06-19: qty 5

## 2019-06-19 MED ORDER — SODIUM CHLORIDE 0.9 % IV SOLN
INTRAVENOUS | Status: DC | PRN
  Administered 2019-06-19: 500 mL

## 2019-06-19 MED ORDER — CEFAZOLIN SODIUM-DEXTROSE 2-4 GM/100ML-% IV SOLN
INTRAVENOUS | Status: AC
  Filled 2019-06-19: qty 100

## 2019-06-19 MED ORDER — LACTATED RINGERS IV SOLN: INTRAVENOUS | Status: DC

## 2019-06-19 MED ORDER — FENTANYL CITRATE (PF) 100 MCG/2ML IJ SOLN: 25.0000 ug | INTRAMUSCULAR | Status: DC | PRN

## 2019-06-19 MED ORDER — EPHEDRINE SULFATE-NACL 50-0.9 MG/10ML-% IV SOSY
PREFILLED_SYRINGE | INTRAVENOUS | Status: DC | PRN
  Administered 2019-06-19 (×2): 10 mg via INTRAVENOUS

## 2019-06-19 SURGICAL SUPPLY — 76 items
ADH SKN CLS APL DERMABOND .7 (GAUZE/BANDAGES/DRESSINGS)
BAG DECANTER FOR FLEXI CONT (MISCELLANEOUS) ×3 IMPLANT
BLADE 10 SAFETY STRL DISP (BLADE) IMPLANT
BLADE HEX COATED 2.75 (ELECTRODE) IMPLANT
BLADE SURG 10 STRL SS (BLADE) ×2 IMPLANT
BLADE SURG 15 STRL LF DISP TIS (BLADE) ×1 IMPLANT
BLADE SURG 15 STRL SS (BLADE) ×3
BNDG COHESIVE 4X5 TAN STRL (GAUZE/BANDAGES/DRESSINGS) IMPLANT
BNDG CONFORM 2 STRL LF (GAUZE/BANDAGES/DRESSINGS) IMPLANT
BNDG CONFORM 3 STRL LF (GAUZE/BANDAGES/DRESSINGS) IMPLANT
BNDG ELASTIC 3X5.8 VLCR STR LF (GAUZE/BANDAGES/DRESSINGS) IMPLANT
BNDG ELASTIC 4X5.8 VLCR STR LF (GAUZE/BANDAGES/DRESSINGS) ×3 IMPLANT
BNDG ELASTIC 6X5.8 VLCR STR LF (GAUZE/BANDAGES/DRESSINGS) IMPLANT
BNDG GAUZE ELAST 4 BULKY (GAUZE/BANDAGES/DRESSINGS) ×3 IMPLANT
CANISTER SUCT 1200ML W/VALVE (MISCELLANEOUS) IMPLANT
CLOSURE WOUND 1/2 X4 (GAUZE/BANDAGES/DRESSINGS)
COVER BACK TABLE 60X90IN (DRAPES) ×3 IMPLANT
COVER WAND RF STERILE (DRAPES) IMPLANT
DECANTER SPIKE VIAL GLASS SM (MISCELLANEOUS) IMPLANT
DERMABOND ADVANCED (GAUZE/BANDAGES/DRESSINGS)
DERMABOND ADVANCED .7 DNX12 (GAUZE/BANDAGES/DRESSINGS) IMPLANT
DRAPE INCISE IOBAN 66X45 STRL (DRAPES) IMPLANT
DRAPE U-SHAPE 76X120 STRL (DRAPES) ×3 IMPLANT
DRSG ADAPTIC 3X8 NADH LF (GAUZE/BANDAGES/DRESSINGS) IMPLANT
DRSG EMULSION OIL 3X3 NADH (GAUZE/BANDAGES/DRESSINGS) IMPLANT
DRSG HYDROCOLLOID 4X4 (GAUZE/BANDAGES/DRESSINGS) ×3 IMPLANT
DRSG PAD ABDOMINAL 8X10 ST (GAUZE/BANDAGES/DRESSINGS) ×2 IMPLANT
ELECT REM PT RETURN 9FT ADLT (ELECTROSURGICAL) ×3
ELECTRODE REM PT RTRN 9FT ADLT (ELECTROSURGICAL) ×1 IMPLANT
GAUZE SPONGE 4X4 12PLY STRL (GAUZE/BANDAGES/DRESSINGS) ×3 IMPLANT
GAUZE SPONGE 4X4 12PLY STRL LF (GAUZE/BANDAGES/DRESSINGS) IMPLANT
GAUZE XEROFORM 1X8 LF (GAUZE/BANDAGES/DRESSINGS) IMPLANT
GAUZE XEROFORM 5X9 LF (GAUZE/BANDAGES/DRESSINGS) IMPLANT
GLOVE BIO SURGEON STRL SZ 6.5 (GLOVE) ×4 IMPLANT
GLOVE BIO SURGEON STRL SZ8 (GLOVE) IMPLANT
GLOVE BIO SURGEONS STRL SZ 6.5 (GLOVE) ×2
GOWN STRL REUS W/ TWL LRG LVL3 (GOWN DISPOSABLE) ×2 IMPLANT
GOWN STRL REUS W/TWL LRG LVL3 (GOWN DISPOSABLE) ×6
MANIFOLD NEPTUNE II (INSTRUMENTS) IMPLANT
MATRIX WOUND 3-LAYER 7X10 (Tissue) ×1 IMPLANT
MICROMATRIX 1000MG (Tissue) ×3 IMPLANT
NDL HYPO 25X1 1.5 SAFETY (NEEDLE) IMPLANT
NEEDLE HYPO 25X1 1.5 SAFETY (NEEDLE) IMPLANT
NS IRRIG 1000ML POUR BTL (IV SOLUTION) ×3 IMPLANT
PACK BASIN DAY SURGERY FS (CUSTOM PROCEDURE TRAY) ×3 IMPLANT
PADDING CAST ABS 3INX4YD NS (CAST SUPPLIES)
PADDING CAST ABS 4INX4YD NS (CAST SUPPLIES)
PADDING CAST ABS COTTON 3X4 (CAST SUPPLIES) IMPLANT
PADDING CAST ABS COTTON 4X4 ST (CAST SUPPLIES) IMPLANT
PENCIL SMOKE EVACUATOR (MISCELLANEOUS) IMPLANT
SHEET MEDIUM DRAPE 40X70 STRL (DRAPES) ×3 IMPLANT
SLEEVE SCD COMPRESS KNEE MED (MISCELLANEOUS) IMPLANT
SOLUTION PARTIC MCRMTRX 1000MG (Tissue) IMPLANT
SPLINT FIBERGLASS 3X35 (CAST SUPPLIES) ×3 IMPLANT
SPLINT FIBERGLASS 4X30 (CAST SUPPLIES) IMPLANT
SPLINT PLASTER CAST XFAST 3X15 (CAST SUPPLIES) IMPLANT
SPLINT PLASTER XTRA FASTSET 3X (CAST SUPPLIES)
SPONGE LAP 18X18 RF (DISPOSABLE) ×5 IMPLANT
STAPLER VISISTAT 35W (STAPLE) IMPLANT
STOCKINETTE IMPERVIOUS LG (DRAPES) IMPLANT
STRIP CLOSURE SKIN 1/2X4 (GAUZE/BANDAGES/DRESSINGS) IMPLANT
SURGILUBE 2OZ TUBE FLIPTOP (MISCELLANEOUS) ×2 IMPLANT
SUT SILK 3 0 PS 1 (SUTURE) IMPLANT
SUT SILK 4 0 PS 2 (SUTURE) IMPLANT
SUT VIC AB 5-0 PS2 18 (SUTURE) ×5 IMPLANT
SYR BULB IRRIGATION 50ML (SYRINGE) ×3 IMPLANT
SYR CONTROL 10ML LL (SYRINGE) IMPLANT
TAPE HYPAFIX 6 X30' (GAUZE/BANDAGES/DRESSINGS)
TAPE HYPAFIX 6X30 (GAUZE/BANDAGES/DRESSINGS) IMPLANT
TOWEL GREEN STERILE FF (TOWEL DISPOSABLE) ×3 IMPLANT
TRAY DSU PREP LF (CUSTOM PROCEDURE TRAY) ×3 IMPLANT
TUBE CONNECTING 20'X1/4 (TUBING) ×1
TUBE CONNECTING 20X1/4 (TUBING) ×2 IMPLANT
UNDERPAD 30X36 HEAVY ABSORB (UNDERPADS AND DIAPERS) ×3 IMPLANT
WOUND MATRIX 3-LAYER 7X10 (Tissue) ×1 IMPLANT
YANKAUER SUCT BULB TIP NO VENT (SUCTIONS) ×3 IMPLANT

## 2019-06-19 NOTE — Anesthesia Postprocedure Evaluation (Signed)
Anesthesia Post Note  Patient: Todd Lloyd  Procedure(s) Performed: Debridement of right leg wound (Right Leg Lower) APPLICATION OF A-CELL AND HOME VAC (Right Leg Lower)     Patient location during evaluation: PACU Anesthesia Type: General Level of consciousness: awake and alert Pain management: pain level controlled Vital Signs Assessment: post-procedure vital signs reviewed and stable Respiratory status: spontaneous breathing, nonlabored ventilation, respiratory function stable and patient connected to nasal cannula oxygen Cardiovascular status: blood pressure returned to baseline and stable Postop Assessment: no apparent nausea or vomiting Anesthetic complications: no    Last Vitals:  Vitals:   06/19/19 1130 06/19/19 1145  BP: 132/80 140/78  Pulse: 84 75  Resp: 12 16  Temp:  36.6 C  SpO2: 97% 99%    Last Pain:  Vitals:   06/19/19 1145  TempSrc:   PainSc: 0-No pain                 Barnet Glasgow

## 2019-06-19 NOTE — Op Note (Signed)
DATE OF OPERATION: 06/19/2019  LOCATION: [facility]  PREOPERATIVE DIAGNOSIS: Right knee wound  POSTOPERATIVE DIAGNOSIS: Same  PROCEDURE 1.  Excision of right knee wound 6 x 8 cm of skin, soft tissue and tendon 2.  Placement of Acell (1 gm and 7 x 10 cm) and VAC placement  SURGEON: Lafreda Casebeer [place] Kiyana Vazguez, DO  ASSISTANT: Phoebe Sharps, PA  EBL: 5 cc  CONDITION: Stable  COMPLICATIONS: None  INDICATION: The patient, Todd Lloyd, is a 79 y.o. male born on 1940/12/28, is here for treatment after a traumatic right knee wound.   PROCEDURE DETAILS:  The patient was seen prior to surgery and marked.  The IV antibiotics were given. The patient was taken to the operating room and given a general anesthetic. A standard time out was performed and all information was confirmed by those in the room. SCD was placed on the left leg.  The right leg was prepped and draped.  The #10 blade and curette was used to excise the 6 x 8 cm wound.   The wound was irrigated with antibiotic solution and saline.  Hemostasis was achieved with electrocautery.  All of the ACell powder and sheet was applied.  It was secured in place with 5-0 Vicryl.  The Adaptic was then applied and secured with the Vicryl.  K-Y jelly was applied with the back on top.  There was an excellent seal.  The leg was wrapped with Kerlix and an Ace wrap.The patient was allowed to wake up and taken to recovery room in stable condition at the end of the case. The family was notified at the end of the case.   The advanced practice practitioner (APP) assisted throughout the case.  The APP was essential in retraction and counter traction when needed to make the case progress smoothly.  This retraction and assistance made it possible to see the tissue plans for the procedure.  The assistance was needed for blood control, tissue re-approximation and assisted with closure of the incision site.  The [employer] was signed  into law in 2016 which includes the topic of electronic health records.  This provides immediate access to information in MyChart.  This includes consultation notes, operative notes, office notes, lab results and pathology reports.  If you have any questions about what you read please let us know at your next visit or call us at the office.  We are right here with you.

## 2019-06-19 NOTE — Transfer of Care (Signed)
Immediate Anesthesia Transfer of Care Note  Patient: Todd Lloyd  Procedure(s) Performed: Debridement of right leg wound (Right Leg Lower) APPLICATION OF A-CELL AND HOME VAC (Right Leg Lower)  Patient Location: PACU  Anesthesia Type:General  Level of Consciousness: awake  Airway & Oxygen Therapy: Patient Spontanous Breathing and Patient connected to face mask oxygen  Post-op Assessment: Report given to RN and Post -op Vital signs reviewed and stable  Post vital signs: Reviewed and stable  Last Vitals:  Vitals Value Taken Time  BP 132/80 06/19/19 1130  Temp 36.7 C 06/19/19 1109  Pulse 79 06/19/19 1140  Resp 18 06/19/19 1139  SpO2 96 % 06/19/19 1140  Vitals shown include unvalidated device data.  Last Pain:  Vitals:   06/19/19 1130  TempSrc:   PainSc: 0-No pain         Complications: No apparent anesthesia complications

## 2019-06-19 NOTE — Anesthesia Procedure Notes (Signed)
Procedure Name: LMA Insertion Date/Time: 06/19/2019 10:26 AM Performed by: Lieutenant Diego, CRNA Pre-anesthesia Checklist: Patient identified, Emergency Drugs available, Suction available and Patient being monitored Patient Re-evaluated:Patient Re-evaluated prior to induction Oxygen Delivery Method: Circle system utilized Preoxygenation: Pre-oxygenation with 100% oxygen Induction Type: IV induction Ventilation: Mask ventilation without difficulty LMA: LMA inserted LMA Size: 5.0 Number of attempts: 1 Placement Confirmation: positive ETCO2 and breath sounds checked- equal and bilateral Tube secured with: Tape Dental Injury: Teeth and Oropharynx as per pre-operative assessment

## 2019-06-19 NOTE — Interval H&P Note (Signed)
History and Physical Interval Note:  06/19/2019 9:44 AM  Todd Lloyd  has presented today for surgery, with the diagnosis of Abscess Of Right Leg, Wound of right lower extremity.  The various methods of treatment have been discussed with the patient and family. After consideration of risks, benefits and other options for treatment, the patient has consented to  Procedure(s) with comments: Debridement of right leg wound (Right) - 45 min APPLICATION OF A-CELL AND HOME VAC (Right) as a surgical intervention.  The patient's history has been reviewed, patient examined, no change in status, stable for surgery.  I have reviewed the patient's chart and labs.  Questions were answered to the patient's satisfaction.     Loel Lofty Bonetta Mostek

## 2019-06-19 NOTE — Discharge Instructions (Signed)
Wound Care with Acell  Guide to Wound Care  Proper wound care may reduce the risk of infection, improve healing rates, and limit scarring.  This is a general guide to help care for and manage wounds treated with ACell MicroMatrix?or Cytal Wound Matrix.   Dressing Changes The frequency of dressing changes can vary based on which product was applied, the size of the wound, or the amount of wound drainage. Dressing inspections are recommended, at least weekly.   If you have a Wound VAC it will be changed in one week after the first time it is applied.  Then it will be changed once or twice a week.   If you don't have a Wound VAC, then place KY gel on the wound daily and cover with gauze.  Dressing Types Primary Dressing:  Non-adherent dressing goes directly over wounds being treated with the powder or sheet (MicroMatrix and/or Cytal).  Secondary Dressing:  Secures the primary dressing in place and provides extra protection, compression, and absorption.  1. Wash Hands - To help decrease the risk of infection, caregivers should wash their hands for a minimum of 20 seconds and may use medical gloves.   2. Remove the Dressings - Avoid removing product from the wound by carefully removing the applicable dressing(s) at the time points recommended above, or as recommended by the treating physician.  Expected Color and Odor:  It is entirely normal for the wound to have an unpleasant odor and to form a caramel-colored gel as the product absorbs into the wound. It is  important to leave this gel on the wound site.  3. Clean the Wound - Use clean water or saline to gently rinse around the wound surface and remove any excess discharge that may be present on the wound. Do not wipe off any of the caramel-colored gel on the wound.   What to look out for: . Large or increased amount of drainage  . Surrounding skin has worsening redness or hot to touch  . Increased pain in or around the wound  . Flu-like  symptoms, fatigue, decreased appetite, fever  . Hard, crusty wound surface with black or brown coloring  4. Apply New Dressings - Dressings should cover the entire wound and be suitable for maintaining a moist wound environment.  The non-adherent mesh dressing should be left in place.  New dressing should consist of KY Jelly to keep the wound moist and soft gauze secured with a wrap or tape.   Maintain a Hydrated Wound Area It is important to keep the wound area moist throughout the healing process. If the wound appears to be dry during dressing changes, select a dressing that will hydrate the wound and maintain that ideal moist environment. If you are unsure what to do, ask the treating physician.  Remodeling Process Every patient heals differently, and no two cases are the same. The size and location of the wound, product type and layering configurations, and general patient health all contribute to how quickly a wound will heal.  While many factors can influence the rate at which the product absorbs, the following can be used as a general guide.   THINGS TO DO: Refrain from smoking High protein diet with plenty of vegetables and some fruit  Limit simple processed carbohydrates and sugar Protect the wound from trauma Protect the dressing  Micromatrix powder       Cytal Sheet            Sorbact dressing  Post Anesthesia Home Care Instructions  Activity: Get plenty of rest for the remainder of the day. A responsible individual must stay with you for 24 hours following the procedure.  For the next 24 hours, DO NOT: -Drive a car -Paediatric nurse -Drink alcoholic beverages -Take any medication unless instructed by your physician -Make any legal decisions or sign important papers.  Meals: Start with liquid foods such as gelatin or soup. Progress to regular foods as tolerated. Avoid greasy, spicy, heavy foods. If nausea and/or vomiting occur, drink only clear liquids until the nausea  and/or vomiting subsides. Call your physician if vomiting continues.  Special Instructions/Symptoms: Your throat may feel dry or sore from the anesthesia or the breathing tube placed in your throat during surgery. If this causes discomfort, gargle with warm salt water. The discomfort should disappear within 24 hours.  If you had a scopolamine patch placed behind your ear for the management of post- operative nausea and/or vomiting:  1. The medication in the patch is effective for 72 hours, after which it should be removed.  Wrap patch in a tissue and discard in the trash. Wash hands thoroughly with soap and water. 2. You may remove the patch earlier than 72 hours if you experience unpleasant side effects which may include dry mouth, dizziness or visual disturbances. 3. Avoid touching the patch. Wash your hands with soap and water after contact with the patch.

## 2019-06-20 ENCOUNTER — Encounter: Payer: Self-pay | Admitting: *Deleted

## 2019-06-26 ENCOUNTER — Telehealth: Payer: Self-pay | Admitting: Plastic Surgery

## 2019-06-26 NOTE — Telephone Encounter (Signed)
Jiana from [facility] calling in regards to wound vac request for this patient. Please call her back at [phone] ext [phone]

## 2019-06-27 ENCOUNTER — Ambulatory Visit (INDEPENDENT_AMBULATORY_CARE_PROVIDER_SITE_OTHER): Payer: Medicare Other | Admitting: Plastic Surgery

## 2019-06-27 ENCOUNTER — Other Ambulatory Visit: Payer: Self-pay

## 2019-06-27 ENCOUNTER — Encounter: Payer: Self-pay | Admitting: Plastic Surgery

## 2019-06-27 VITALS — BP 118/79 | HR 82 | Temp 98.4°F | Ht 72.0 in | Wt 191.6 lb

## 2019-06-27 DIAGNOSIS — Z945 Skin transplant status: Secondary | ICD-10-CM

## 2019-06-27 NOTE — Progress Notes (Signed)
   Subjective:     Patient ID: Todd Lloyd, male    DOB: 09/03/1940, 79 y.o.   MRN: [id number]  Chief Complaint  Patient presents with  . Skin Problem    PO visit from debridement of right leg wound with placement of A cell and home VAC    HPI: The patient is a 78 y.o. male here for follow-up after excision of right knee wound 6 x 8 cm of skin, soft tissue, and tendon with placement of ACell (1 g and 7 x 10 cm sheet) and VAC placement with Dr. Marla Roe on 06/19/2019.  Today he reports he is feeling very well.  Denies fever, chest pain, shortness of breath, N/V.  Wound is healing well, good granulation tissue forming and ACell being incorporated.  Wound size 6 x 8 x 0.5 cm.  No signs of infection, redness, seroma/hematoma.  Review of Systems  Constitutional: Negative for chills and fever.  HENT: Negative for congestion and sore throat.   Respiratory: Negative for cough and shortness of breath.   Cardiovascular: Negative for chest pain.  Gastrointestinal: Negative for abdominal pain, nausea and vomiting.  Musculoskeletal: Negative for back pain, myalgias and neck pain.  Skin: Positive for wound (right knee). Negative for rash.     Objective:   Vital Signs BP 118/79 (Patient Position: Sitting)   Pulse 82   Temp 98.4 F (36.9 C) (Temporal)   Ht 6' (1.829 m)   Wt 191 lb 9.6 oz (86.9 kg)   SpO2 (!) 82%   BMI 25.99 kg/m  Vital Signs and Nursing Note Reviewed  Physical Exam  Constitutional: He is oriented to person, place, and time and well-developed, well-nourished, and in no distress.  HENT:  Head: Normocephalic and atraumatic.  Eyes: EOM are normal.  Pulmonary/Chest: Effort normal.  Musculoskeletal:        General: Normal range of motion.     Cervical back: Normal range of motion.       Legs:     Comments: Good granulation tissue beginning to form, Acell still incorporating. No signs of infection, redness, seroma/hematoma, or drainage. 6 x 8 x 0.5 cm.    Neurological: He is alert and oriented to person, place, and time. Gait normal.  Skin: Skin is warm and dry. No rash noted. No erythema. No pallor.  Psychiatric: Mood, memory, affect and judgment normal.      Assessment/Plan:     ICD-10-CM   1. S/P split thickness skin graft  Z94.5     [name] is doing well today.  Wound is filling in with good granulation tissue, ACell still incorporating.  Replaced wound VAC.  Wrapped at leg with Kerlix and Ace wraps.  Follow-up in 1 week for wound VAC change.  Call office with any questions/concerns.  The [place] was signed into law in 2016 which includes the topic of electronic health records.  This provides immediate access to information in MyChart.  This includes consultation notes, operative notes, office notes, lab results and pathology reports.  If you have any questions about what you read please let us know at your next visit or call us at the office.  We are right here with you.   Threasa Heads, PA-C 06/27/2019, 3:37 PM

## 2019-06-30 NOTE — Telephone Encounter (Signed)
Call back to [place] at [facility] needs confirmation on wound measurements -from 06/10/19 He states that the order form noted a depth of .5cm- but the notes from the office visit did not have a depth listed I forwarded this info to [place], [place]- & she will make an addendum for the notes 06/10/19 & will forward 06/27/19 notes as well with measurements

## 2019-07-03 NOTE — Progress Notes (Signed)
Patient is a 79 year old male here for follow-up after excision of right knee wound, soft tissue, and tendon with placement of ACell and VAC placement with Dr. Marla Roe on 06/19/2019. Wound VAC was changed last week (size 6 x 8).  Today Todd Lloyd is accompanied by his daughter and reports he's doing well.  Wound Vac in place. Denies fever, CP, SOB, N/V, recent cold symptoms. Wound is healing well, good granulation tissue continues to form, Acell being incorporated. Wound size 5 x 7 x 0.5 cm with central superior aspect slightly deeper. No signs of infection, seroma/hematoma. Some peri-wound erythema present.     Changed Wound Vac. Follow up in 1 week for wound check.  The [place] was signed into law in 2016 which includes the topic of electronic health records.  This provides immediate access to information in MyChart.  This includes consultation notes, operative notes, office notes, lab results and pathology reports.  If you have any questions about what you read please let us know at your next visit or call us at the office.  We are right here with you.

## 2019-07-04 ENCOUNTER — Ambulatory Visit (INDEPENDENT_AMBULATORY_CARE_PROVIDER_SITE_OTHER): Payer: Medicare Other | Admitting: Plastic Surgery

## 2019-07-04 ENCOUNTER — Encounter: Payer: Self-pay | Admitting: Plastic Surgery

## 2019-07-04 ENCOUNTER — Other Ambulatory Visit: Payer: Self-pay

## 2019-07-04 VITALS — BP 147/83 | HR 78 | Temp 97.8°F | Ht 72.0 in | Wt 194.2 lb

## 2019-07-04 DIAGNOSIS — S81801D Unspecified open wound, right lower leg, subsequent encounter: Secondary | ICD-10-CM

## 2019-07-11 ENCOUNTER — Ambulatory Visit: Payer: Medicare Other | Admitting: Plastic Surgery

## 2019-07-12 ENCOUNTER — Other Ambulatory Visit: Payer: Self-pay | Admitting: Neurology

## 2019-07-15 ENCOUNTER — Other Ambulatory Visit: Payer: Self-pay

## 2019-07-15 ENCOUNTER — Encounter: Payer: Self-pay | Admitting: Plastic Surgery

## 2019-07-15 ENCOUNTER — Telehealth: Payer: Self-pay

## 2019-07-15 ENCOUNTER — Ambulatory Visit (INDEPENDENT_AMBULATORY_CARE_PROVIDER_SITE_OTHER): Payer: Medicare Other | Admitting: Plastic Surgery

## 2019-07-15 VITALS — BP 127/78 | HR 79 | Temp 98.4°F | Ht 72.0 in | Wt 197.6 lb

## 2019-07-15 DIAGNOSIS — S81801D Unspecified open wound, right lower leg, subsequent encounter: Secondary | ICD-10-CM

## 2019-07-15 NOTE — Telephone Encounter (Signed)
Faxed medical supplies to [facility]

## 2019-07-15 NOTE — Progress Notes (Addendum)
With his daughter for follow up on his right knee wound.  He underwent excision of the right knee wound on January 28.  ACell and the VAC was placed.  The periwound area is a little bit irritated and a noticeable bit strong.  The wound looks great.  The wound is 6 x 7 x 0.5 cm. He is getting excellent granulation tissue.  I do not see any signs of infection.  The odor appears normal to me.  A picture was taken and put in the chart with the patient's permission.  Donated ACell was applied powder and sheet.  The VAC was reapplied.  I recommend RepliCare for the periwound area and we will see if we can have this sent to the house.

## 2019-07-22 ENCOUNTER — Encounter: Payer: Self-pay | Admitting: Internal Medicine

## 2019-07-22 ENCOUNTER — Ambulatory Visit (INDEPENDENT_AMBULATORY_CARE_PROVIDER_SITE_OTHER): Payer: Medicare Other | Admitting: Internal Medicine

## 2019-07-22 ENCOUNTER — Telehealth: Payer: Self-pay

## 2019-07-22 ENCOUNTER — Other Ambulatory Visit: Payer: Self-pay

## 2019-07-22 VITALS — BP 153/69 | HR 89 | Temp 97.5°F | Ht 72.0 in | Wt 197.6 lb

## 2019-07-22 DIAGNOSIS — S81801D Unspecified open wound, right lower leg, subsequent encounter: Secondary | ICD-10-CM | POA: Diagnosis not present

## 2019-07-22 NOTE — Patient Instructions (Signed)
Todd Lloyd,  It was a pleasure seeing you today.  Please follow up in one week.  Please let us know by the end of the week if you have not received Replicare.  We will work on our end to get it to you.
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
## 2019-07-22 NOTE — Telephone Encounter (Signed)
Patient came in today for an office visit. Daughter stated she has not received order from [facility]. Called [facility] and spoke with [place]. She indicated they had the order had a question about the size, but was resolved. However medical Replicare was never sent out. It will sent out today and should arrive no later than Thursday

## 2019-07-26 ENCOUNTER — Ambulatory Visit: Payer: Medicare Other | Attending: Internal Medicine

## 2019-07-26 DIAGNOSIS — Z23 Encounter for immunization: Secondary | ICD-10-CM | POA: Insufficient documentation

## 2019-07-26 NOTE — Progress Notes (Signed)
   Covid-19 Vaccination Clinic  Name:  Todd Lloyd    MRN: [id number] DOB: 1941/05/17  07/26/2019  [name] was observed post Covid-19 immunization for 15 minutes without incident. He was provided with Vaccine Information Sheet and instruction to access the V-Safe system.   [name] was instructed to call 911 with any severe reactions post vaccine: Marland Kitchen Difficulty breathing  . Swelling of face and throat  . A fast heartbeat  . A bad rash all over body  . Dizziness and weakness   Immunizations Administered    Name Date Dose VIS Date Route   Pfizer COVID-19 Vaccine 07/26/2019 10:30 AM 0.3 mL 05/02/2019 Intramuscular   Manufacturer: [place]   Lot: [id number]   [place]: [id number]

## 2019-07-28 NOTE — Telephone Encounter (Signed)
Call to pt

## 2019-07-29 ENCOUNTER — Ambulatory Visit: Payer: Medicare Other | Admitting: Internal Medicine

## 2019-07-31 ENCOUNTER — Ambulatory Visit (INDEPENDENT_AMBULATORY_CARE_PROVIDER_SITE_OTHER): Payer: Medicare Other | Admitting: Internal Medicine

## 2019-07-31 ENCOUNTER — Other Ambulatory Visit: Payer: Self-pay

## 2019-07-31 ENCOUNTER — Encounter: Payer: Self-pay | Admitting: Internal Medicine

## 2019-07-31 VITALS — BP 136/83 | HR 89 | Temp 98.2°F | Ht 72.0 in | Wt 194.0 lb

## 2019-07-31 DIAGNOSIS — S81801D Unspecified open wound, right lower leg, subsequent encounter: Secondary | ICD-10-CM
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
## 2019-08-05 ENCOUNTER — Encounter: Payer: Self-pay | Admitting: Internal Medicine

## 2019-08-05 ENCOUNTER — Other Ambulatory Visit: Payer: Self-pay

## 2019-08-05 ENCOUNTER — Ambulatory Visit (INDEPENDENT_AMBULATORY_CARE_PROVIDER_SITE_OTHER): Payer: Medicare Other | Admitting: Internal Medicine

## 2019-08-05 VITALS — BP 157/73 | HR 81 | Temp 98.0°F | Ht 72.0 in | Wt 193.0 lb

## 2019-08-05 DIAGNOSIS — S81801D Unspecified open wound, right lower leg, subsequent encounter: Secondary | ICD-10-CM

## 2019-08-05 DIAGNOSIS — E7800 Pure hypercholesterolemia, unspecified: Secondary | ICD-10-CM | POA: Diagnosis not present

## 2019-08-05 DIAGNOSIS — D692 Other nonthrombocytopenic purpura: Secondary | ICD-10-CM | POA: Diagnosis not present

## 2019-08-05 DIAGNOSIS — N401 Enlarged prostate with lower urinary tract symptoms: Secondary | ICD-10-CM | POA: Diagnosis not present

## 2019-08-05 DIAGNOSIS — G2581 Restless legs syndrome: Secondary | ICD-10-CM | POA: Diagnosis not present

## 2019-08-05 DIAGNOSIS — J449 Chronic obstructive pulmonary disease, unspecified: Secondary | ICD-10-CM | POA: Diagnosis not present

## 2019-08-05 DIAGNOSIS — K219 Gastro-esophageal reflux disease without esophagitis: Secondary | ICD-10-CM | POA: Diagnosis not present

## 2019-08-05 DIAGNOSIS — I4891 Unspecified atrial fibrillation: Secondary | ICD-10-CM | POA: Diagnosis not present

## 2019-08-05 DIAGNOSIS — L089 Local infection of the skin and subcutaneous tissue, unspecified: Secondary | ICD-10-CM | POA: Diagnosis not present

## 2019-08-05 DIAGNOSIS — Z1331 Encounter for screening for depression: Secondary | ICD-10-CM | POA: Diagnosis not present

## 2019-08-05 DIAGNOSIS — I10 Essential (primary) hypertension: Secondary | ICD-10-CM | POA: Diagnosis not present

## 2019-08-05 DIAGNOSIS — C92Z1 Other myeloid leukemia, in remission: Secondary | ICD-10-CM | POA: Diagnosis not present

## 2019-08-05 DIAGNOSIS — Z7901 Long term (current) use of anticoagulants: Secondary | ICD-10-CM | POA: Diagnosis not present

## 2019-08-05 DIAGNOSIS — E538 Deficiency of other specified B group vitamins: Secondary | ICD-10-CM | POA: Diagnosis not present
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
## 2019-08-11 ENCOUNTER — Telehealth: Payer: Self-pay | Admitting: *Deleted

## 2019-08-11 NOTE — Telephone Encounter (Signed)
Faxed on (08/05/19) to [facility] patient's office visit on (1/19/2) with wound measurements.  Confirmation received.//AB/CMA

## 2019-08-12 ENCOUNTER — Ambulatory Visit: Payer: Medicare Other

## 2019-08-18 ENCOUNTER — Ambulatory Visit: Payer: Medicare Other | Attending: Internal Medicine

## 2019-08-18 DIAGNOSIS — Z23 Encounter for immunization: Secondary | ICD-10-CM

## 2019-08-18 NOTE — Progress Notes (Signed)
   Covid-19 Vaccination Clinic  Name:  Todd Lloyd    MRN: [id number] DOB: [birth year]  08/18/2019  Todd Lloyd was observed post Covid-19 immunization for 15 minutes without incident. He was provided with Vaccine Information Sheet and instruction to access the V-Safe system.   Todd Lloyd was instructed to call 911 with any severe reactions post vaccine: Marland Kitchen Difficulty breathing  . Swelling of face and throat  . A fast heartbeat  . A bad rash all over body  . Dizziness and weakness   Immunizations Administered    Name Date Dose VIS Date Route   Pfizer COVID-19 Vaccine 08/18/2019 10:13 AM 0.3 mL 05/02/2019 Intramuscular   Manufacturer: [employer]   Lot: [id number]   [place]: [id number]

## 2019-08-21 ENCOUNTER — Other Ambulatory Visit: Payer: Self-pay

## 2019-08-21 ENCOUNTER — Encounter (HOSPITAL_BASED_OUTPATIENT_CLINIC_OR_DEPARTMENT_OTHER): Payer: Self-pay | Admitting: Plastic Surgery

## 2019-08-21 ENCOUNTER — Ambulatory Visit (INDEPENDENT_AMBULATORY_CARE_PROVIDER_SITE_OTHER): Payer: Medicare Other | Admitting: Internal Medicine

## 2019-08-21 ENCOUNTER — Encounter: Payer: Self-pay | Admitting: Internal Medicine

## 2019-08-21 VITALS — BP 166/90 | HR 72 | Temp 97.8°F | Ht 72.0 in | Wt 200.0 lb

## 2019-08-21 DIAGNOSIS — S81801D Unspecified open wound, right lower leg, subsequent encounter: Secondary | ICD-10-CM
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
## 2019-08-21 NOTE — Progress Notes (Signed)
Left message with Dr. Elenor Quinones office. Need for eliquis hold time for surgery planned on 09/01/2019.

## 2019-08-25 ENCOUNTER — Telehealth: Payer: Self-pay | Admitting: *Deleted

## 2019-08-25 NOTE — Telephone Encounter (Signed)
Received on (08/21/19) request for one set of wound measurements for vac reauthorization from [employer] from [place].    Faxed wound measurements for time frame:2/15-2/28 and 3/15-3/28 back to [facility] w/[employer].  Confirmation received.  Copy scanned into the chart.//AB/CMA

## 2019-08-26 ENCOUNTER — Encounter: Payer: Self-pay | Admitting: Internal Medicine

## 2019-08-26 ENCOUNTER — Ambulatory Visit (INDEPENDENT_AMBULATORY_CARE_PROVIDER_SITE_OTHER): Payer: Medicare Other | Admitting: Internal Medicine

## 2019-08-26 ENCOUNTER — Other Ambulatory Visit: Payer: Self-pay

## 2019-08-26 VITALS — BP 147/81 | HR 71 | Temp 98.2°F | Ht 72.0 in | Wt 200.0 lb

## 2019-08-26 DIAGNOSIS — S81801D Unspecified open wound, right lower leg, subsequent encounter: Secondary | ICD-10-CM | POA: Diagnosis not present
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
## 2019-08-27 ENCOUNTER — Telehealth: Payer: Self-pay

## 2019-08-27 NOTE — Telephone Encounter (Signed)
Called [facility] spoke with Sharae to order Adaptic 3x3. 90 days supply, change once a week

## 2019-08-28 ENCOUNTER — Telehealth: Payer: Self-pay

## 2019-08-28 ENCOUNTER — Other Ambulatory Visit (HOSPITAL_COMMUNITY): Payer: Medicare Other

## 2019-08-28 NOTE — Telephone Encounter (Signed)
Returned call to daughter advised her I called [facility] and they are putting in a order to replicare

## 2019-08-28 NOTE — Telephone Encounter (Signed)
Received call from patient's wife regarding wound care dressings. They have received the adaptic but are enquiring about another dressing which is similar to DuoDERM which she believes should also have been ordered.

## 2019-09-01 ENCOUNTER — Ambulatory Visit (HOSPITAL_BASED_OUTPATIENT_CLINIC_OR_DEPARTMENT_OTHER): Admission: RE | Admit: 2019-09-01 | Payer: Medicare Other | Source: Home / Self Care | Admitting: Plastic Surgery

## 2019-09-01 SURGERY — MINOR SPLIT THICKNESS SKIN GRAFT
Anesthesia: General | Site: Knee | Laterality: Right

## 2019-09-02 ENCOUNTER — Other Ambulatory Visit: Payer: Self-pay

## 2019-09-02 ENCOUNTER — Ambulatory Visit (INDEPENDENT_AMBULATORY_CARE_PROVIDER_SITE_OTHER): Payer: Medicare Other | Admitting: Internal Medicine

## 2019-09-02 ENCOUNTER — Encounter: Payer: Self-pay | Admitting: Internal Medicine

## 2019-09-02 VITALS — BP 162/90 | HR 71 | Temp 98.0°F | Ht 72.0 in | Wt 200.0 lb

## 2019-09-02 DIAGNOSIS — S81801D Unspecified open wound, right lower leg, subsequent encounter: Secondary | ICD-10-CM
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
## 2019-09-09 ENCOUNTER — Ambulatory Visit (INDEPENDENT_AMBULATORY_CARE_PROVIDER_SITE_OTHER): Payer: Medicare Other | Admitting: Internal Medicine

## 2019-09-09 ENCOUNTER — Encounter: Payer: Medicare Other | Admitting: Surgical

## 2019-09-09 ENCOUNTER — Other Ambulatory Visit: Payer: Self-pay

## 2019-09-09 ENCOUNTER — Telehealth: Payer: Self-pay | Admitting: Cardiovascular Disease

## 2019-09-09 ENCOUNTER — Encounter: Payer: Self-pay | Admitting: Internal Medicine

## 2019-09-09 VITALS — BP 176/68 | HR 82 | Temp 97.5°F | Ht 72.0 in | Wt 200.0 lb

## 2019-09-09 DIAGNOSIS — S81801D Unspecified open wound, right lower leg, subsequent encounter: Secondary | ICD-10-CM | POA: Diagnosis not present

## 2019-09-09 NOTE — Telephone Encounter (Signed)
New Message    [name] is calling and says she will need to assist the pt to his appt because he is hard of hearing and has memory issues    Please advise
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
## 2019-09-09 NOTE — Telephone Encounter (Signed)
Spoke with [name] and let her know note in the computer that it is ok for her to come up with pt.

## 2019-09-12 ENCOUNTER — Ambulatory Visit (INDEPENDENT_AMBULATORY_CARE_PROVIDER_SITE_OTHER): Payer: Medicare Other | Admitting: Cardiovascular Disease

## 2019-09-12 ENCOUNTER — Encounter: Payer: Self-pay | Admitting: Cardiovascular Disease

## 2019-09-12 ENCOUNTER — Other Ambulatory Visit: Payer: Self-pay

## 2019-09-12 VITALS — BP 118/74 | HR 87 | Ht 72.0 in | Wt 204.8 lb

## 2019-09-12 DIAGNOSIS — I10 Essential (primary) hypertension: Secondary | ICD-10-CM | POA: Diagnosis not present

## 2019-09-12 DIAGNOSIS — J449 Chronic obstructive pulmonary disease, unspecified: Secondary | ICD-10-CM

## 2019-09-12 DIAGNOSIS — Z7901 Long term (current) use of anticoagulants: Secondary | ICD-10-CM

## 2019-09-12 DIAGNOSIS — I482 Chronic atrial fibrillation, unspecified: Secondary | ICD-10-CM | POA: Diagnosis not present
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
## 2019-09-12 NOTE — Patient Instructions (Signed)
Medication Instructions:  Your physician recommends that you continue on your current medications as directed. Please refer to the Current Medication list given to you today.   *If you need a refill on your cardiac medications before your next appointment, please call your pharmacy*  Lab Work: NONE   Testing/Procedures: NONE  Follow-Up: At [facility], you and your health needs are our priority.  As part of our continuing mission to provide you with exceptional heart care, we have created designated Provider Care Teams.  These Care Teams include your primary Cardiologist (physician) and Advanced Practice Providers (APPs -  Physician Assistants and Nurse Practitioners) who all work together to provide you with the care you need, when you need it.  We recommend signing up for the patient portal called "MyChart".  Sign up information is provided on this After Visit Summary.  MyChart is used to connect with patients for Virtual Visits (Telemedicine).  Patients are able to view lab/test results, encounter notes, upcoming appointments, etc.  Non-urgent messages can be sent to your provider as well.   To learn more about what you can do with MyChart, go to [email or web address].[email or web address].    Your next appointment:   12 month(s) You will receive a reminder letter in the mail two months in advance. If you don't receive a letter, please call our office to schedule the follow-up appointment.   The format for your next appointment:   In Person  Provider:   You may see Tiffany [place], MD or one of the following Advanced Practice Providers on your designated Care Team:    Luke Kilroy, PA-C  Callie Goodrich, PA-C  Jesse Cleaver, FNP    
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
## 2019-09-16 ENCOUNTER — Other Ambulatory Visit: Payer: Self-pay

## 2019-09-16 ENCOUNTER — Ambulatory Visit (INDEPENDENT_AMBULATORY_CARE_PROVIDER_SITE_OTHER): Payer: Medicare Other | Admitting: Internal Medicine

## 2019-09-16 ENCOUNTER — Encounter: Payer: Self-pay | Admitting: Internal Medicine

## 2019-09-16 VITALS — BP 166/79 | HR 77 | Temp 98.4°F | Ht 72.0 in | Wt 207.0 lb

## 2019-09-16 DIAGNOSIS — S81801D Unspecified open wound, right lower leg, subsequent encounter: Secondary | ICD-10-CM | POA: Diagnosis not present

## 2019-09-17 ENCOUNTER — Telehealth: Payer: Self-pay | Admitting: *Deleted

## 2019-09-17 NOTE — Telephone Encounter (Signed)
Received on (09/16/19) via fax from [facility].  Completed and faxed back.  Confirmation received.//AB/CMA

## 2019-09-24 ENCOUNTER — Ambulatory Visit (INDEPENDENT_AMBULATORY_CARE_PROVIDER_SITE_OTHER): Payer: Medicare Other | Admitting: Internal Medicine

## 2019-09-24 ENCOUNTER — Encounter: Payer: Self-pay | Admitting: Internal Medicine

## 2019-09-24 ENCOUNTER — Telehealth: Payer: Self-pay | Admitting: Emergency Medicine

## 2019-09-24 ENCOUNTER — Other Ambulatory Visit: Payer: Self-pay

## 2019-09-24 VITALS — BP 140/78 | HR 80 | Temp 98.0°F

## 2019-09-24 DIAGNOSIS — S81801D Unspecified open wound, right lower leg, subsequent encounter: Secondary | ICD-10-CM | POA: Diagnosis not present

## 2019-09-24 NOTE — Telephone Encounter (Signed)
pt called stating that some company called and stated that they would be dropping off equipment for him to sleep in and they would pick up the next day - doesn;'t remember the company but the number is [phone] - daughter wants to know why Dr. Lamonte Sakai ordered testing and didn't;'t tell anyone - CB# [phone] ([facility] - good for daughter)

## 2019-09-24 NOTE — Telephone Encounter (Signed)
Patient last OV was 01/17/19, with Dr. Lamonte Sakai. Patient has no new orders, test.  Patient does not have any HST, ONO order in chart.  No recommendation in last OV notes on any sleep test. Called [phone], Ace Gins, spoke with Estill Bamberg who transferred call to [facility].  Bethanne Ginger stated they had received a fax from Dr Lamonte Sakai for ONO. Bethanne Ginger stated there was a fax received, but she was unable to find fax.  Bethanne Ginger stated once she located fax, she  would fax it to [place] office. Gilda stated ONO was cancelled at this time. ATC Patient's daughter [name] [facility]).  Left detailed message ONO was cancelled, order was not placed by Dr. Lamonte Sakai, and to call back with questions if needed.
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
## 2019-09-25 ENCOUNTER — Telehealth: Payer: Self-pay

## 2019-09-25 NOTE — Telephone Encounter (Signed)
Orders fxed to [facility] per Dr. Heber  Copy scanned into chart

## 2019-09-26 ENCOUNTER — Telehealth: Payer: Self-pay | Admitting: Plastic Surgery

## 2019-09-26 NOTE — Telephone Encounter (Signed)
Daughter, [name], called to see if the dressing had been called in because she was told it would be delivered today and it hasn't as of yet. If it will not be delivered today, what should they use as a substitute to get him through the weekend changes. Please call her back to advise.

## 2019-09-29 ENCOUNTER — Other Ambulatory Visit: Payer: Self-pay

## 2019-09-29 ENCOUNTER — Ambulatory Visit (INDEPENDENT_AMBULATORY_CARE_PROVIDER_SITE_OTHER): Payer: Medicare Other | Admitting: Internal Medicine

## 2019-09-29 ENCOUNTER — Encounter: Payer: Self-pay | Admitting: Internal Medicine

## 2019-09-29 VITALS — BP 160/89 | HR 66 | Temp 97.5°F | Ht 72.0 in | Wt 207.0 lb

## 2019-09-29 DIAGNOSIS — S81801D Unspecified open wound, right lower leg, subsequent encounter: Secondary | ICD-10-CM | POA: Diagnosis not present

## 2019-09-29 NOTE — Telephone Encounter (Signed)
Called on (09/26/19) and spoke with the patient's daughter [name]) regarding the message below.  I asked her if she had spoken with anyone at [facility] regarding the supply order.  She stated that she did speak with someone at [facility] after she called [place] regarding the message below.  She stated that they informed her that the patient's insurance (Medicare) will not cover the medical supplies.  I informed her that Dr. Heber [place] was not in the office today, but Johanna,PA-C addressed the message.  She stated that they can use any dressing (gauze, bandage,etc) to cover for the weekend and follow-up on Monday with Dr. Heber [place] for other alternatives.    [name] agreed to the dressing changes, and stated that the patient has an appointment scheduled for Monday.//AB/CMA
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
## 2019-09-30 ENCOUNTER — Telehealth: Payer: Self-pay | Admitting: Cardiovascular Disease

## 2019-09-30 ENCOUNTER — Telehealth: Payer: Self-pay

## 2019-09-30 NOTE — Telephone Encounter (Signed)
Faxed medical supply order to [facility] : silicone tape and [employer] -change daily

## 2019-09-30 NOTE — Telephone Encounter (Signed)
  *  STAT* If patient is at the pharmacy, call can be transferred to refill team.   1. Which medications need to be refilled? (please list name of each medication and dose if known)   apixaban (ELIQUIS) 5 MG TABS tablet    2. Which pharmacy/location (including street and city if local pharmacy) is medication to be sent to? [place], [address] [address]  3. Do they need a 30 day or 90 day supply? 60 days  Pt said he needs a refill of this medication. He said a prescription needs to be fax at [phone] to Dr. Elon Alas

## 2019-10-01 MED ORDER — APIXABAN 5 MG PO TABS: 5.0000 mg | ORAL_TABLET | Freq: Two times a day (BID) | ORAL | 6 refills | Status: DC

## 2019-10-01 NOTE — Telephone Encounter (Signed)
Dr. Oval Linsey is already gone and won't be back til 10/21/19. Lurena Joiner is not here tomorrow and all app have left for the day I will route this back to raquel for follow up tomorrow.

## 2019-10-01 NOTE — Telephone Encounter (Signed)
OK to refill. Why do I need to be in the office for him to get a refill?  ~TCR

## 2019-10-02 ENCOUNTER — Telehealth: Payer: Self-pay | Admitting: Plastic Surgery

## 2019-10-02 NOTE — Telephone Encounter (Signed)
Faxed copy of office note including measurements on 06/10/19 to Juanita at [facility]

## 2019-10-02 NOTE — Telephone Encounter (Signed)
Please just ask the DOD to sign.

## 2019-10-02 NOTE — Telephone Encounter (Signed)
Juanita with [employer] is calling to see if she can get wound assessments for the month of January with measurement of wounds for Medicare. Please fax notes to [phone] to Juanita's attn. She can be reached at [phone] if needed for questions or add'l info.

## 2019-10-06 ENCOUNTER — Encounter: Payer: Self-pay | Admitting: Internal Medicine

## 2019-10-06 ENCOUNTER — Ambulatory Visit (INDEPENDENT_AMBULATORY_CARE_PROVIDER_SITE_OTHER): Payer: Medicare Other | Admitting: Internal Medicine

## 2019-10-06 ENCOUNTER — Other Ambulatory Visit: Payer: Self-pay

## 2019-10-06 VITALS — BP 156/82 | HR 79 | Temp 97.8°F | Ht 72.0 in | Wt 207.0 lb

## 2019-10-06 DIAGNOSIS — S81801D Unspecified open wound, right lower leg, subsequent encounter: Secondary | ICD-10-CM
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
## 2019-10-07 ENCOUNTER — Telehealth: Payer: Self-pay | Admitting: *Deleted

## 2019-10-07 NOTE — Telephone Encounter (Addendum)
Faxed order to [facility] for supplies for the patient.  Confirmation received.  Copy scanned into the chart.//AB/CMA

## 2019-10-07 NOTE — Telephone Encounter (Signed)
Patient called in c/o wheezing has COPD, advised to call pulmonologist

## 2019-10-08 ENCOUNTER — Telehealth: Payer: Self-pay | Admitting: Emergency Medicine

## 2019-10-08 NOTE — Telephone Encounter (Signed)
Called and spoke with pt stating to him the info per SG. Pt states that he does not have a pulse ox at home but recently when he has gone to the doctor to have sats checked, they are always ranging 97-98%. Asked pt if he has been coughing any and he said some and will occ get up some yellow phlegm. Pt has not been around anyone that he knows of that has been sick.  Stated to pt to use his nebulizer while having a flare and he stated that that makes his breathing worse. Pt has been scheduled for televisit tomorrow 5/20 with Tammy. Nothing further needed.

## 2019-10-08 NOTE — Telephone Encounter (Signed)
Primary Pulmonologist: [place] Last office visit and with whom: 01/17/20 What do we see them for (pulmonary problems): COPD Nodule Last OV assessment/plan:  Assessment & Plan:  COPD (chronic obstructive pulmonary disease) ([place]) He has difficulty giving history, difficulty describing his medical regimen.  Unclear to me that he takes the [facility] correctly.  All the same is clear that he is experiencing decreased functional capacity, dyspnea whenever he tries to do significant exertion.  He is the primary caregiver for his wife.  He has had a reassuring echocardiogram and over the last 4 years a reassuring overall cardiac evaluation.  He does have severe COPD.  I repeated his walking oximetry today and he did not qualify for supplemental oxygen.  Unclear where to go from here but I think it would be reasonable to try changing him to only nebulized medications to see if there is an issue with adequate medication delivery.  I will change him to DuoNeb, stop the Stiolto, see if he benefits.  He is interested in a motorized scooter and I will make the referral.  He did not get repeat pulmonary function testing as we had discussed.  Still may be beneficial to get these going forward.  If they are unchanged and if he does not respond to the change to [facility], then we may have to reconsider possible cardiac contribution.  I can have him do a cardiopulmonary stress test or send him back to review his symptoms with Dr. Oval Linsey.  Solitary pulmonary nodule 1 cm nodule in the superior segment of the right lower lobe that needs follow-up scan in October 2020.   Was appointment offered to patient (explain)?  Wants to come in not telephone next available is Monday wants recommendations   Reason for call: patient is having wheezing and chest tightness with little exertion. Patient is taking no nebulizer medication due to making him feel "like he was in another world". Patient is taking maintenance inhalers as prescribed  and albuterol 4-5 times a day 2 puffs usually each time.   SG please advise

## 2019-10-08 NOTE — Telephone Encounter (Signed)
Pt. Really needs a virtual visit today so we can prescribe medications if necessary , and the office  appointment on Monday too.  He needs to use his nebs while he is having a flare We need to know his sats if possible. Is he coughing up discolored secretions? Does he have a fever? Has he had a sick contact? He needs to be told that he should seek emergency care if his condition worsens.  He will need a walk test when he does come into the office.  Please try to arrange for a tele visit today in addition to a OV Monday.

## 2019-10-09 ENCOUNTER — Encounter: Payer: Self-pay | Admitting: Adult Health

## 2019-10-09 ENCOUNTER — Other Ambulatory Visit: Payer: Self-pay

## 2019-10-09 ENCOUNTER — Ambulatory Visit (INDEPENDENT_AMBULATORY_CARE_PROVIDER_SITE_OTHER): Payer: Medicare Other | Admitting: Adult Health

## 2019-10-09 DIAGNOSIS — R918 Other nonspecific abnormal finding of lung field: Secondary | ICD-10-CM | POA: Diagnosis not present

## 2019-10-09 DIAGNOSIS — R911 Solitary pulmonary nodule: Secondary | ICD-10-CM | POA: Diagnosis not present

## 2019-10-09 DIAGNOSIS — J441 Chronic obstructive pulmonary disease with (acute) exacerbation: Secondary | ICD-10-CM | POA: Diagnosis not present

## 2019-10-09 MED ORDER — PREDNISONE 20 MG PO TABS: 40.0000 mg | ORAL_TABLET | Freq: Every day | ORAL | 0 refills | Status: DC

## 2019-10-09 NOTE — Patient Instructions (Addendum)
Prednisone 40mg  daily for 4 days , take with food.  Set up CT chest .  Continue on Asmanex and Stiolto .  Mucinex DM Twice daily  As needed  Cough /congestion .  Albuterol Inhaler As needed   Follow up with Dr. Lamonte Sakai  Or Aariyah Sampey NP in 1 week in office and As needed   Please contact office for sooner follow up if symptoms do not improve or worsen or seek emergency care

## 2019-10-09 NOTE — Progress Notes (Signed)
Virtual Visit via Telephone Note  I connected with Todd Lloyd on 10/09/19 at  1:30 PM EDT by telephone and verified that I am speaking with the correct person using two identifiers.  Location: Patient: Home  Provider: Home Office    I discussed the limitations, risks, security and privacy concerns of performing an evaluation and management service by telephone and the availability of in person appointments. I also discussed with the patient that there may be a patient responsible charge related to this service. The patient expressed understanding and agreed to proceed.   History of Present Illness: 79 year old male former smoker followed for severe COPD and , lung nodule Medical history significant for history of leukemia with previous bone marrow transplant 1999   Today's televisit is an acute office visit.  Patient complains over the last few months he has breathing has not been as good.  Last visit he was changed from inhalers to nebulizers but did not tolerate.  He is now back to taking Asmanex and Stiolto.  Patient says he has some intermittent cough and wheezing gets winded with minimum activity.  Has low activity tolerance.  The symptoms have been chronic but seems to be more burdensome over the last few months.  He has received both of his Covid vaccines.  He denies any fever discolored mucus chest pain orthopnea or increased leg swelling.  He is noticing that he is having use his albuterol inhaler more than usual.  Patient is going to wound center with leg wraps for right leg wound .   Remains on Eliquis . No known bleeding issues.   Patient Active Problem List   Diagnosis Date Noted  . Leg wound, right 06/10/2019  . Abscess of right leg   . Solitary pulmonary nodule 12/16/2018  . Essential hypertension 10/21/2018  . Chronic anticoagulation 10/21/2018  . Chronic low back pain 07/16/2018  . RLS (restless legs syndrome) 10/18/2016  . Peripheral neuropathy 10/18/2016  .  Memory difficulties 09/12/2016  . Gait abnormality 09/12/2016  . History of colonic polyps 01/12/2016  . Tobacco use disorder 04/29/2015  . Dyspnea on exertion 03/19/2015  . COPD (chronic obstructive pulmonary disease) ([place]) 03/19/2015  . Insomnia 11/14/2013  . Vitamin B12 deficiency 11/14/2013  . Ataxia 11/13/2013  . Dizziness 11/12/2013  . Chronic atrial fibrillation ([place]) 11/12/2013    Current Outpatient Medications on File Prior to Visit  Medication Sig Dispense Refill  . albuterol (PROVENTIL) (2.5 MG/3ML) 0.083% nebulizer solution Use 2.5 mL by nebulization every 6 hours and as needed J44.9 300 mL 11  . apixaban (ELIQUIS) 5 MG TABS tablet Take 1 tablet (5 mg total) by mouth 2 (two) times daily. 60 tablet 6  . arformoterol (BROVANA) 15 MCG/2ML NEBU Take 2 mLs (15 mcg total) by nebulization 2 (two) times daily. Dx: J44.9, file under Part B 120 mL 5  . digoxin (LANOXIN) 0.25 MG tablet Take 250 mcg by mouth daily.    Marland Kitchen donepezil (ARICEPT) 10 MG tablet Take 10 mg by mouth at bedtime.    Marland Kitchen ezetimibe (ZETIA) 10 MG tablet Take 1 tablet by mouth daily.    . furosemide (LASIX) 20 MG tablet Take 1 tablet (20 mg total) by mouth daily. (Patient taking differently: Take by mouth daily. ) 7 tablet 0  . losartan (COZAAR) 25 MG tablet Take 25 mg by mouth 2 (two) times a day.    . memantine (NAMENDA) 5 MG tablet Take 5 mg by mouth 2 (two) times daily.    Marland Kitchen  mometasone (ASMANEX) 220 MCG/INH inhaler Inhale 2 puffs into the lungs daily.    Marland Kitchen omeprazole (PRILOSEC) 20 MG capsule Take 20 mg by mouth daily.    Marland Kitchen oxyCODONE-acetaminophen (PERCOCET) 10-325 MG tablet Take 1 tablet by mouth every 4 (four) hours as needed for pain. 21 tablet 0  . pramipexole (MIRAPEX) 1 MG tablet TAKE 1 TABLET BY MOUTH DAILY AT BEDTIME. 30 tablet 5  . Tiotropium Bromide-Olodaterol (STIOLTO RESPIMAT) 2.5-2.5 MCG/ACT AERS Inhale 2 puffs into the lungs daily. 1 Inhaler 5   Current Facility-Administered Medications on File Prior to  Visit  Medication Dose Route Frequency Provider Last Rate Last Admin  . 0.9 %  sodium chloride infusion  500 mL Intravenous Continuous Milus Banister, MD          Observations/Objective: CT chest March 18, 2019 12 mm right lower lobe nodule  Assessment and Plan: Acute COPD flare -increased symptom burden  Short steroid burst.  Follow up in office in 1 week to check walk test and decide if labs need to done .   Lung nodule - 1.2cm RLL nodule   Set up CT chest follow up.   Plan  Patient Instructions  Prednisone 40mg  daily for 4 days , take with food.  Set up CT chest .  Continue on Asmanex and Stiolto .  Mucinex DM Twice daily  As needed  Cough /congestion .  Albuterol Inhaler As needed   Follow up with Dr. Lamonte Sakai  Or Parrett NP in 1 week in office and As needed   Please contact office for sooner follow up if symptoms do not improve or worsen or seek emergency care        Follow Up Instructions:   follow up in 1 week and As needed    I discussed the assessment and treatment plan with the patient. The patient was provided an opportunity to ask questions and all were answered. The patient agreed with the plan and demonstrated an understanding of the instructions.   The patient was advised to call back or seek an in-person evaluation if the symptoms worsen or if the condition fails to improve as anticipated.  I provided 22 minutes of non-face-to-face time during this encounter.   Rexene Edison, NP

## 2019-10-10 NOTE — Addendum Note (Signed)
Addended by: Desmond Dike C on: 10/10/2019 10:08 AM   Modules accepted: Orders

## 2019-10-14 ENCOUNTER — Other Ambulatory Visit: Payer: Self-pay

## 2019-10-14 ENCOUNTER — Telehealth: Payer: Self-pay | Admitting: Adult Health

## 2019-10-14 ENCOUNTER — Other Ambulatory Visit: Payer: Self-pay | Admitting: Cardiovascular Disease

## 2019-10-14 ENCOUNTER — Ambulatory Visit
Admission: RE | Admit: 2019-10-14 | Discharge: 2019-10-14 | Disposition: A | Discharge status: Home / Self Care | Payer: Medicare Other | Source: Ambulatory Visit | Attending: Adult Health | Admitting: Adult Health

## 2019-10-14 DIAGNOSIS — R918 Other nonspecific abnormal finding of lung field: Secondary | ICD-10-CM

## 2019-10-14 DIAGNOSIS — J432 Centrilobular emphysema: Secondary | ICD-10-CM | POA: Diagnosis not present

## 2019-10-14 NOTE — Telephone Encounter (Signed)
Spoke with receptionist at [facility]. She stated that the radiologist wanted to make sure that we could see the report in epic. I advised her that we could.   The impression is listed below:  IMPRESSION: 1. Interval enlargement of RIGHT lower lobe bilobed spiculated nodule most consistent with bronchogenic carcinoma. Lesion warrants biopsy and FDG PET scan for staging. 2. Mild centrilobular emphysema in the upper lobes. 3. Coronary artery calcification and Aortic Atherosclerosis (ICD10-I70.0)."  TP, please advise. Thanks!

## 2019-10-15 MED ORDER — APIXABAN 5 MG PO TABS: 5.0000 mg | ORAL_TABLET | Freq: Two times a day (BID) | ORAL | 2 refills | Status: AC

## 2019-10-15 NOTE — Telephone Encounter (Signed)
Printed for [employer] PA to sign and will fax

## 2019-10-15 NOTE — Telephone Encounter (Signed)
Spoke with daughter and aware , keep ov with Dr. Lamonte Sakai  To discuss results in detail

## 2019-10-15 NOTE — Addendum Note (Signed)
Addended by: Alvina Filbert B on: 10/15/2019 05:34 PM   Modules accepted: Orders

## 2019-10-21 ENCOUNTER — Ambulatory Visit: Payer: Medicare Other | Admitting: Adult Health

## 2019-10-23 ENCOUNTER — Telehealth: Payer: Self-pay | Admitting: *Deleted

## 2019-10-23 NOTE — Telephone Encounter (Signed)
Received Physician's Prescription from [facility] via of fax requesting orders to continue the vac.    The vac was discontinued on (09/29/19).  Physician's Prescription was faxed and confirmation received.  Copy scanned into the chart.//AB/CMA

## 2019-10-28 ENCOUNTER — Other Ambulatory Visit: Payer: Self-pay

## 2019-10-28 ENCOUNTER — Ambulatory Visit (INDEPENDENT_AMBULATORY_CARE_PROVIDER_SITE_OTHER): Payer: Medicare Other | Admitting: Internal Medicine

## 2019-10-28 ENCOUNTER — Encounter: Payer: Self-pay | Admitting: Internal Medicine

## 2019-10-28 VITALS — BP 151/84 | HR 74 | Temp 97.7°F | Ht 72.0 in | Wt 200.4 lb

## 2019-10-28 DIAGNOSIS — S81801D Unspecified open wound, right lower leg, subsequent encounter: Secondary | ICD-10-CM
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
## 2019-10-29 ENCOUNTER — Encounter: Payer: Self-pay | Admitting: Emergency Medicine

## 2019-10-29 ENCOUNTER — Ambulatory Visit (INDEPENDENT_AMBULATORY_CARE_PROVIDER_SITE_OTHER): Payer: Medicare Other | Admitting: Emergency Medicine

## 2019-10-29 DIAGNOSIS — R911 Solitary pulmonary nodule: Secondary | ICD-10-CM

## 2019-10-29 DIAGNOSIS — J449 Chronic obstructive pulmonary disease, unspecified: Secondary | ICD-10-CM

## 2019-10-29 NOTE — Assessment & Plan Note (Signed)
Please continue Stiolto and Asmanex as you have been taking them. Keep your albuterol available to use 2 puffs when needed shortness of breath, chest tightness, wheezing. Continue to work hard on your weight loss and exercise

## 2019-10-29 NOTE — Assessment & Plan Note (Signed)
Slowly enlarging right lower lobe pulmonary nodule.  Now 16 x 11 mm, from 12 mm on prior scan 03/18/2019.  Suspicious for primary malignancy.  We discussed the pros and cons of biopsy today.  He is contemplating this.  In the end we decided that we would follow another CT scan of the chest in 3 months to look for interval change versus resolution.  We will factor this information into our decision regarding possible navigational bronchoscopy.  We will repeat your CT scan of the chest in 3 months.  Depending on that scan we will discuss possible bronchoscopy to sample your pulmonary nodule. Follow with Dr Lamonte Sakai in 3 months to review your CT scan or sooner if you have any problems.

## 2019-10-29 NOTE — Progress Notes (Signed)
Subjective:    Patient ID: Todd Lloyd, male    DOB: September 13, 1940, 79 y.o.   MRN: [id number]  Shortness of Breath Pertinent negatives include no ear pain, fever, headaches, leg swelling, rash, rhinorrhea, sore throat, vomiting or wheezing.   ROV 12/16/2018 --Mr. Shrode has severe COPD, history of remote leukemia with BMTx.  At our last visit he was having increased dyspnea, unclear whether this was a progression of his COPD.  We continued him on Stiolto and added nebulized albuterol to see if he would get more benefit.  I performed a CT scan of his chest on 7/24 which I reviewed, this shows a new nodule 1.2 cm in the superior segment of the right lower lobe. He is unsure how to use nebulizer - didn't get much instruction on how to use, when to use.    ROV 01/17/2019 --79 year old man with severe COPD and associated hypoxemic respiratory failure.  Past medical history also significant for remote leukemia with bone marrow transplant.  He reported a significant decrease in his functional capacity when I saw him a month ago.  I continued him on Stiolto and albuterol as needed. Uses 2-3x a day, but he is still very limited w exertion.  We had planned to repeat his pulmonary function testing but this has not been done. He also requested a motorized scooter. We doesn't recall speaking to anyone from [facility] about this. Discussed pulm rehab w him today - he is limited by his need to care for his wife at home.  He has a 1 cm right superior segmental nodule on CT chest.  We plan to repeat his scan in October, consider suitability for bronchoscopy if this is growing.  ROV 10/29/19 --follow-up visit 79 year old man with severe COPD and hypoxemic respiratory failure.  Also with a history of remote leukemia with bone marrow transplant.  We have been managing him on Stiolto and Asmanex.  He was treated for acute exacerbation in May with prednisone and benefited. He is also working on wt loss, has cut down  sugar. He uses albuterol 2-3x a day. ? Whether there is an anxiety component, some degree panic.   He also has a 12 mm spiculated right lower lobe nodule that we had been following, last scan 03/18/2019.  Repeat scan 10/14/2019 reviewed by me shows an interval increase in size of the right lower lobe nodule, now 16 x 11 mm compared with 02/2019 no new nodules, widespread centrilobular emphysematous change   Review of Systems  Constitutional: Negative for fever and unexpected weight change.  HENT: Negative for congestion, dental problem, ear pain, nosebleeds, postnasal drip, rhinorrhea, sinus pressure, sneezing, sore throat and trouble swallowing.   Eyes: Negative for redness and itching.  Respiratory: Positive for shortness of breath. Negative for cough, chest tightness and wheezing.   Cardiovascular: Negative for palpitations and leg swelling.  Gastrointestinal: Negative for nausea and vomiting.  Genitourinary: Negative for dysuria.  Musculoskeletal: Negative for joint swelling.  Skin: Negative for rash.  Neurological: Negative for headaches.  Hematological: Does not bruise/bleed easily.  Psychiatric/Behavioral: Negative for dysphoric mood. The patient is not nervous/anxious.     Past Medical History:  Diagnosis Date   Acid reflux    Atrial fibrillation (HCC)    Chronic low back pain 07/16/2018   Colon polyps    COPD (chronic obstructive pulmonary disease) (HCC)    Dysrhythmia    afib   Gait abnormality 09/12/2016   High cholesterol    Hypertension  Leukemia ([place])    s/p bone marrow transplant in 1990   Memory difficulties 09/12/2016   Peripheral neuropathy 10/18/2016   RLS (restless legs syndrome) 10/18/2016     Family History  Problem Relation Age of Onset   Colon cancer Father    Heart disease Father    Leukemia Maternal Uncle    Melanoma Mother    Dementia Mother    ALS Sister    Lung cancer Brother      Social History   Socioeconomic History     Marital status: Married    Spouse name: Hoyle Sauer   Number of children: 0   Years of education: 12   Highest education level: Not on file  Occupational History   Occupation: Retired    Comment: Chartered loss adjuster  Tobacco Use   Smoking status: Former Smoker    Packs/day: 2.00    Years: 43.00    Pack years: 86.00    Types: Cigarettes    Quit date: 05/22/2000    Years since quitting: 19.4   Smokeless tobacco: Never Used   Tobacco comment: Counseled to remain smoke free  Substance and Sexual Activity   Alcohol use: No    Alcohol/week: 0.0 standard drinks   Drug use: No   Sexual activity: Not on file  Other Topics Concern   Not on file  Social History Narrative   Lives with spouse   Caffeine use:  Coffee (2-3 cups every morning)   Right-handed   Social Determinants of Health   Financial Resource Strain:    Difficulty of Paying Living Expenses:   Food Insecurity:    Worried About Charity fundraiser in the Last Year:    Arboriculturist in the Last Year:   Transportation Needs:    Film/video editor (Medical):    Lack of Transportation (Non-Medical):   Physical Activity:    Days of Exercise per Week:    Minutes of Exercise per Session:   Stress:    Feeling of Stress :   Social Connections:    Frequency of Communication with Friends and Family:    Frequency of Social Gatherings with Friends and Family:    Attends Religious Services:    Active Member of Clubs or Organizations:    Attends Music therapist:    Marital Status:   Intimate Partner Violence:    Fear of Current or Ex-Partner:    Emotionally Abused:    Physically Abused:    Sexually Abused:   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 180 tablet 2   arformoterol (BROVANA) 15 MCG/2ML NEBU Take 2 mLs (15 mcg total) by nebulization 2 (two) times daily. Dx: J44.9, file under Part B 120 mL 5   digoxin (LANOXIN) 0.25 MG tablet Take 250 mcg by mouth daily.     donepezil (ARICEPT) 10 MG tablet Take 10 mg by mouth at bedtime.     ezetimibe (ZETIA) 10 MG tablet Take 1 tablet by mouth daily.     furosemide (LASIX) 20 MG tablet Take 1 tablet (20 mg total)  by mouth daily. (Patient taking differently: Take by mouth daily. ) 7 tablet 0   losartan (COZAAR) 25 MG tablet Take 25 mg by mouth 2 (two) times a day.     memantine (NAMENDA) 5 MG tablet Take 5 mg by mouth 2 (two) times daily.     mometasone (ASMANEX) 220 MCG/INH inhaler Inhale 2 puffs into the lungs daily.     omeprazole (PRILOSEC) 20 MG capsule Take 20 mg by mouth daily.     oxyCODONE-acetaminophen (PERCOCET) 10-325 MG tablet Take 1 tablet by mouth every 4 (four) hours as needed for pain. 21 tablet 0   pramipexole (MIRAPEX) 1 MG tablet TAKE 1 TABLET BY MOUTH DAILY AT BEDTIME. 30 tablet 5   Tiotropium Bromide-Olodaterol (STIOLTO RESPIMAT) 2.5-2.5 MCG/ACT AERS Inhale 2 puffs into the lungs daily. 1 Inhaler 5   albuterol (PROVENTIL) (2.5 MG/3ML) 0.083% nebulizer solution Use 2.5 mL by nebulization every 6 hours and as needed J44.9 (Patient not taking: Reported on 10/29/2019) 300 mL 11   Facility-Administered Medications Prior to Visit  Medication Dose Route Frequency Provider Last Rate Last Admin   0.9 %  sodium chloride infusion  500 mL Intravenous Continuous Milus Banister, MD             Objective:   Physical Exam Vitals:   10/29/19 0939  BP: 130/72  Pulse: 76  Temp: 97.9 F (36.6 C)  TempSrc: Oral  SpO2: 94%  Weight: 200 lb 12.8 oz (91.1 kg)  Height: 6' (1.829 m)   Gen: Pleasant, elderly man, in no distress,  normal affect  ENT: No  lesions,  mouth clear,  oropharynx clear, no postnasal drip  Neck: No JVD, no stridor  Lungs: No use of accessory muscles, distant, no wheeze   Cardiovascular: RRR, heart sounds normal, no murmur or gallops, no peripheral edema  Musculoskeletal: No deformities, no cyanosis or clubbing  Neuro: alert, somewhat flustered with poor memory of his meds, our previous plans.   Skin: Warm, no lesions or rashes      Assessment & Plan:  COPD (chronic obstructive pulmonary disease) ([place]) Please continue Stiolto and Asmanex as you have been taking them. Keep your albuterol available to use 2 puffs when needed shortness of breath, chest tightness, wheezing. Continue to work hard on your weight loss and exercise  Solitary pulmonary nodule Slowly enlarging right lower lobe pulmonary nodule.  Now 16 x 11 mm, from 12 mm on prior scan 03/18/2019.  Suspicious for primary malignancy.  We discussed the pros and cons of biopsy today.  He is contemplating this.  In the end we decided that we would follow another CT scan of the chest in 3 months to look for interval change versus resolution.  We will factor this information into our decision regarding possible navigational bronchoscopy.  We will repeat your CT scan of the chest in 3 months.  Depending on that scan we will discuss possible bronchoscopy to sample your pulmonary nodule. Follow with Dr Lamonte Sakai in 3 months to review your CT scan or sooner if you have any problems.  [name], MD, PhD 10/29/2019, 5:46 PM [facility] [phone] or if no answer [phone]

## 2019-10-29 NOTE — Patient Instructions (Addendum)
Please continue Stiolto and Asmanex as you have been taking them. Keep your albuterol available to use 2 puffs when needed shortness of breath, chest tightness, wheezing. Continue to work hard on your weight loss and exercise We will repeat your CT scan of the chest in 3 months.  Depending on that scan we will discuss possible bronchoscopy to sample your pulmonary nodule. Follow with Dr Lamonte Sakai in 3 months to review your CT scan or sooner if you have any problems.

## 2019-10-31 ENCOUNTER — Telehealth: Payer: Self-pay | Admitting: Internal Medicine

## 2019-10-31 NOTE — Telephone Encounter (Signed)
VM left by Josalyn from [facility] requesting an active order for Replicare to be faxed to [phone] ( no phone number left and fax number was not clear on the last 4 numbers).

## 2019-11-03 ENCOUNTER — Telehealth: Payer: Self-pay | Admitting: *Deleted

## 2019-11-03 NOTE — Telephone Encounter (Signed)
Called and spoke with Tiffany w/ [facility] regarding the message below and a fax for the supply order for the Replicare.  Informed Tiffany that I called and spoke with Amy on last Thursday and a verbal order was given for a reorder for the Replicare.  Then I received a two faxes and a call from [facility] regarding the order.  Tiffany review the patient's information and stated that they received the order for the Replicare and the supplies were shipped out today and the patient should receive them on tomorrow.//AB/CMA

## 2019-11-03 NOTE — Telephone Encounter (Signed)
Received a Physician Signature & Date Request on (10/30/19) from [facility].  For refill for the Adaptic 3x3.  Given to the provider to complete.    Request completed on (10/30/19) and faxed to [facility].  Confirmation received.//AB/CMA

## 2019-11-13 ENCOUNTER — Telehealth (HOSPITAL_COMMUNITY): Payer: Self-pay | Admitting: Cardiovascular Disease

## 2019-11-13 DIAGNOSIS — I351 Nonrheumatic aortic (valve) insufficiency: Secondary | ICD-10-CM

## 2019-11-13 DIAGNOSIS — I77810 Thoracic aortic ectasia: Secondary | ICD-10-CM

## 2019-11-13 NOTE — Telephone Encounter (Signed)
Called patient to schedule yearly echo order per Dr. Oval Linsey from 11/2018. Daughter declined to schedule stating he just saw Dr. Oval Linsey 6 weeks ago and she never mentioned an echo and why does he need. She will be calling and states he is doing fine.  Order will be removed from the [place] and if he needs we can reinstate or create new order.

## 2019-11-25 ENCOUNTER — Telehealth: Payer: Self-pay | Admitting: *Deleted

## 2019-11-25 NOTE — Telephone Encounter (Signed)
Received Standard Written Orders from [facility] on (11/21/2019) via of fax requesting signature and to be returned.  Given to provider to sign.  Orders signed and faxed.  Confirmation received.  Copy scanned into the chart.//AB/CMA

## 2019-12-04 NOTE — Addendum Note (Signed)
Addended by: Alvina Filbert B on: 12/04/2019 05:26 PM   Modules accepted: Orders

## 2019-12-04 NOTE — Telephone Encounter (Signed)
Spoke with daughter and scheduled for later this year, first chance patient could come in

## 2019-12-08 ENCOUNTER — Other Ambulatory Visit: Payer: Self-pay

## 2019-12-08 ENCOUNTER — Ambulatory Visit (INDEPENDENT_AMBULATORY_CARE_PROVIDER_SITE_OTHER): Payer: Medicare Other | Admitting: Otolaryngology

## 2019-12-08 ENCOUNTER — Encounter (INDEPENDENT_AMBULATORY_CARE_PROVIDER_SITE_OTHER): Payer: Self-pay | Admitting: Otolaryngology

## 2019-12-08 VITALS — Temp 97.7°F

## 2019-12-08 DIAGNOSIS — H6123 Impacted cerumen, bilateral: Secondary | ICD-10-CM
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
## 2019-12-15 DIAGNOSIS — L720 Epidermal cyst: Secondary | ICD-10-CM | POA: Diagnosis not present

## 2019-12-15 DIAGNOSIS — Z85828 Personal history of other malignant neoplasm of skin: Secondary | ICD-10-CM | POA: Diagnosis not present

## 2019-12-15 DIAGNOSIS — L0889 Other specified local infections of the skin and subcutaneous tissue: Secondary | ICD-10-CM | POA: Diagnosis not present

## 2020-01-07 ENCOUNTER — Other Ambulatory Visit: Payer: Self-pay | Admitting: Neurology

## 2020-01-14 DIAGNOSIS — M48061 Spinal stenosis, lumbar region without neurogenic claudication: Secondary | ICD-10-CM | POA: Diagnosis not present

## 2020-01-14 DIAGNOSIS — G5623 Lesion of ulnar nerve, bilateral upper limbs: Secondary | ICD-10-CM | POA: Diagnosis not present

## 2020-02-03 ENCOUNTER — Telehealth: Payer: Self-pay

## 2020-02-03 ENCOUNTER — Ambulatory Visit
Admission: RE | Admit: 2020-02-03 | Discharge: 2020-02-03 | Disposition: A | Discharge status: Home / Self Care | Payer: Medicare Other | Source: Ambulatory Visit | Attending: Emergency Medicine | Admitting: Emergency Medicine

## 2020-02-03 DIAGNOSIS — R911 Solitary pulmonary nodule: Secondary | ICD-10-CM | POA: Diagnosis not present

## 2020-02-03 NOTE — Telephone Encounter (Signed)
  Will discuss in detail on f/up with Dr. Lamonte Sakai   Will send to Dr. Lamonte Sakai to see if he wants to speak with patient prior to visit

## 2020-02-03 NOTE — Telephone Encounter (Signed)
Received a call report from [facility] radiology re: CT chest obtained today.  Impression copied below, full report available in Epic.    IMPRESSION: Findings of enlarging RIGHT lower lobe lesion highly suspicious for bronchogenic neoplasm. Discussion in multi disciplinary thoracic oncologic setting is suggested with PET or biopsy as warranted.  Stable LEFT adrenal lesion, likely benign, unchanged or even smaller since 2016.   Routing to APP since RB is in hospital today.  Please advise if anything immediate is needed or if we can wait for RB's recs.  Thanks!

## 2020-02-04 NOTE — Telephone Encounter (Signed)
We will review at 9/22 OV

## 2020-02-09 DIAGNOSIS — I10 Essential (primary) hypertension: Secondary | ICD-10-CM | POA: Diagnosis not present

## 2020-02-09 DIAGNOSIS — Z125 Encounter for screening for malignant neoplasm of prostate: Secondary | ICD-10-CM | POA: Diagnosis not present

## 2020-02-09 DIAGNOSIS — E538 Deficiency of other specified B group vitamins: Secondary | ICD-10-CM | POA: Diagnosis not present

## 2020-02-11 ENCOUNTER — Other Ambulatory Visit: Payer: Self-pay

## 2020-02-11 ENCOUNTER — Ambulatory Visit (INDEPENDENT_AMBULATORY_CARE_PROVIDER_SITE_OTHER): Payer: Medicare Other | Admitting: Emergency Medicine

## 2020-02-11 ENCOUNTER — Encounter: Payer: Self-pay | Admitting: Emergency Medicine

## 2020-02-11 VITALS — BP 144/76 | HR 69 | Temp 79.9°F | Ht 72.0 in | Wt 209.6 lb

## 2020-02-11 DIAGNOSIS — J449 Chronic obstructive pulmonary disease, unspecified: Secondary | ICD-10-CM | POA: Diagnosis not present

## 2020-02-11 DIAGNOSIS — Z23 Encounter for immunization: Secondary | ICD-10-CM

## 2020-02-11 DIAGNOSIS — R911 Solitary pulmonary nodule: Secondary | ICD-10-CM | POA: Diagnosis not present

## 2020-02-11 NOTE — Assessment & Plan Note (Signed)
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  He has some hesitation understandably about biopsy either by bronchoscopy or TTNA given his severe COPD and his risks.  We will perform a PET scan to help gauge suspicion, also look for other sites that may be targets.  I suspect that the nodule is a solitary lesion.  We can talk about bronchoscopy after the PET scan.  He may be a candidate for SBRT without a tissue diagnosis depending on what the PET shows.  We could consider discussing with radiation oncology prior to any diagnostics.

## 2020-02-11 NOTE — Patient Instructions (Signed)
There is been slow increase in size in the right lower lobe pulmonary nodule on CT scan.  This is suspicious for a slow-growing primary lung cancer.  We will perform a PET scan to evaluate it further. We need to follow-up after the PET scan so that we can discuss possible biopsy or other strategies for treatment. Continue your inhaled medications as you have been taking them Follow with Dr. Lamonte Sakai next available after the PET scan.

## 2020-02-11 NOTE — Progress Notes (Signed)
   Subjective:    Patient ID: Todd Lloyd, male    DOB: February 21, 1941, 79 y.o.   MRN: [id number]  Shortness of Breath Associated symptoms include chest pain. Pertinent negatives include no ear pain, fever, headaches, leg swelling, rash, rhinorrhea, sore throat, vomiting or wheezing.   ROV 02/11/20 -61 year old man with severe COPD and associated hypoxemic respiratory failure.  He also has history of leukemia with a remote bone marrow transplant.  We have been following a right lower lobe spiculated nodule that had slowly increased in size in May.  Repeat CT performed 02/03/2020 reviewed by me, shows that his right lower lobe spiculated nodule has increased further in size of 2 x 1.3 cm. He is experiencing dyspnea, chest tightness. He is on Stiolto and Asmanex. Uses albuterol about 3x a day. Denies any coughing, does wheeze.    Review of Systems  Constitutional: Negative for fever and unexpected weight change.  HENT: Negative for congestion, dental problem, ear pain, nosebleeds, postnasal drip, rhinorrhea, sinus pressure, sneezing, sore throat and trouble swallowing.   Eyes: Negative for redness and itching.  Respiratory: Positive for shortness of breath. Negative for cough, chest tightness and wheezing.   Cardiovascular: Positive for chest pain. Negative for palpitations and leg swelling.  Gastrointestinal: Negative for nausea and vomiting.  Genitourinary: Negative for dysuria.  Musculoskeletal: Negative for joint swelling.  Skin: Negative for rash.  Neurological: Negative for headaches.  Hematological: Does not bruise/bleed easily.  Psychiatric/Behavioral: Negative for dysphoric mood. The patient is not nervous/anxious.         Objective:   Physical Exam Vitals:   02/11/20 0916  BP: (!) 144/76  Pulse: 69  Temp: (!) 79.9 F (26.6 C)  TempSrc: Temporal  SpO2: 96%  Weight: 209 lb 9.6 oz (95.1 kg)  Height: 6' (1.829 m)   Gen: Pleasant, elderly man, in no distress,  normal  affect  ENT: No lesions,  mouth clear,  oropharynx clear, no postnasal drip  Neck: No JVD, no stridor  Lungs: No use of accessory muscles, distant, no wheeze   Cardiovascular: RRR, heart sounds normal, no murmur or gallops, no peripheral edema  Musculoskeletal: No deformities, no cyanosis or clubbing  Neuro: alert, somewhat flustered with poor memory of his meds, our previous plans.   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  He has some hesitation understandably about biopsy either by bronchoscopy or TTNA given his severe COPD and his risks.  We will perform a PET scan to help gauge suspicion, also look for other sites that may be targets.  I suspect that the nodule is a solitary lesion.  We can talk about bronchoscopy after the PET scan.  He may be a candidate for SBRT without a tissue diagnosis depending on what the PET shows.  We could consider discussing with radiation oncology prior to any diagnostics.  COPD (chronic obstructive pulmonary disease) (HCC) Severe but stable.  Continue his same bronchodilator regimen.  [name], MD, PhD 02/11/2020, 9:44 AM [facility] [phone] or if no answer [phone]

## 2020-02-11 NOTE — Assessment & Plan Note (Signed)
Severe but stable.  Continue his same bronchodilator regimen.

## 2020-02-18 DIAGNOSIS — D692 Other nonthrombocytopenic purpura: Secondary | ICD-10-CM | POA: Diagnosis not present

## 2020-02-18 DIAGNOSIS — Z0000 Encounter for general adult medical examination without abnormal findings: Secondary | ICD-10-CM | POA: Diagnosis not present

## 2020-02-18 DIAGNOSIS — R918 Other nonspecific abnormal finding of lung field: Secondary | ICD-10-CM | POA: Diagnosis not present

## 2020-02-18 DIAGNOSIS — I480 Paroxysmal atrial fibrillation: Secondary | ICD-10-CM | POA: Diagnosis not present

## 2020-02-18 DIAGNOSIS — J449 Chronic obstructive pulmonary disease, unspecified: Secondary | ICD-10-CM | POA: Diagnosis not present

## 2020-02-18 DIAGNOSIS — B0229 Other postherpetic nervous system involvement: Secondary | ICD-10-CM | POA: Diagnosis not present

## 2020-02-18 DIAGNOSIS — E7800 Pure hypercholesterolemia, unspecified: Secondary | ICD-10-CM | POA: Diagnosis not present

## 2020-02-18 DIAGNOSIS — C92Z1 Other myeloid leukemia, in remission: Secondary | ICD-10-CM | POA: Diagnosis not present

## 2020-02-18 DIAGNOSIS — R82998 Other abnormal findings in urine: Secondary | ICD-10-CM | POA: Diagnosis not present

## 2020-02-18 DIAGNOSIS — E538 Deficiency of other specified B group vitamins: Secondary | ICD-10-CM | POA: Diagnosis not present

## 2020-02-18 DIAGNOSIS — M5417 Radiculopathy, lumbosacral region: Secondary | ICD-10-CM | POA: Diagnosis not present

## 2020-02-18 DIAGNOSIS — F0390 Unspecified dementia without behavioral disturbance: Secondary | ICD-10-CM | POA: Diagnosis not present

## 2020-02-18 DIAGNOSIS — I10 Essential (primary) hypertension: Secondary | ICD-10-CM | POA: Diagnosis not present

## 2020-02-24 ENCOUNTER — Ambulatory Visit (HOSPITAL_COMMUNITY)
Admission: RE | Admit: 2020-02-24 | Discharge: 2020-02-24 | Disposition: A | Discharge status: Home / Self Care | Payer: Medicare Other | Source: Ambulatory Visit | Attending: Emergency Medicine | Admitting: Emergency Medicine

## 2020-02-24 ENCOUNTER — Other Ambulatory Visit: Payer: Self-pay

## 2020-02-24 DIAGNOSIS — I2510 Atherosclerotic heart disease of native coronary artery without angina pectoris: Secondary | ICD-10-CM | POA: Insufficient documentation

## 2020-02-24 DIAGNOSIS — J439 Emphysema, unspecified: Secondary | ICD-10-CM | POA: Diagnosis not present

## 2020-02-24 DIAGNOSIS — M47812 Spondylosis without myelopathy or radiculopathy, cervical region: Secondary | ICD-10-CM | POA: Diagnosis not present

## 2020-02-24 DIAGNOSIS — I700 Atherosclerosis of aorta: Secondary | ICD-10-CM | POA: Insufficient documentation

## 2020-02-24 DIAGNOSIS — R911 Solitary pulmonary nodule: Secondary | ICD-10-CM | POA: Diagnosis not present

## 2020-02-24 LAB — GLUCOSE, CAPILLARY: Glucose-Capillary: 95 mg/dL (ref 70–99)

## 2020-02-24 MED ORDER — FLUDEOXYGLUCOSE F - 18 (FDG) INJECTION
10.3700 | Freq: Once | INTRAVENOUS | Status: AC
  Administered 2020-02-24: 10.37 via INTRAVENOUS

## 2020-03-01 ENCOUNTER — Other Ambulatory Visit: Payer: Self-pay | Admitting: Neurology

## 2020-03-03 ENCOUNTER — Other Ambulatory Visit: Payer: Self-pay | Admitting: Neurology

## 2020-03-03 ENCOUNTER — Other Ambulatory Visit: Payer: Self-pay

## 2020-03-03 ENCOUNTER — Ambulatory Visit (HOSPITAL_COMMUNITY): Payer: Medicare Other | Attending: Cardiovascular Disease

## 2020-03-03 DIAGNOSIS — I351 Nonrheumatic aortic (valve) insufficiency: Secondary | ICD-10-CM

## 2020-03-03 DIAGNOSIS — I77810 Thoracic aortic ectasia: Secondary | ICD-10-CM | POA: Diagnosis not present

## 2020-03-03 LAB — ECHOCARDIOGRAM COMPLETE
Area-P 1/2: 4.16 cm2
P 1/2 time: 771 msec
S' Lateral: 3.7 cm

## 2020-03-08 ENCOUNTER — Other Ambulatory Visit: Payer: Self-pay

## 2020-03-08 ENCOUNTER — Encounter: Payer: Self-pay | Admitting: Emergency Medicine

## 2020-03-08 ENCOUNTER — Ambulatory Visit (INDEPENDENT_AMBULATORY_CARE_PROVIDER_SITE_OTHER): Payer: Medicare Other | Admitting: Emergency Medicine

## 2020-03-08 DIAGNOSIS — R911 Solitary pulmonary nodule: Secondary | ICD-10-CM

## 2020-03-08 DIAGNOSIS — J449 Chronic obstructive pulmonary disease, unspecified: Secondary | ICD-10-CM

## 2020-03-08 NOTE — Patient Instructions (Addendum)
Please continue Stiolto 2 puffs once a day as you have been taking it. Keep albuterol available to use 2 puffs when you need if shortness of breath, chest tightness, wheezing. Walking oximetry today We can consider referral to pulmonary rehab to build up your stamina, functional capacity. Let us know if you would like to do this Dr. Lamonte Sakai will discuss your CT scan, PET scan with radiation oncology to see if you are a candidate for radiation treatment.  Will call [name] to discuss further once films have been reviewed. Follow with Dr Lamonte Sakai in 3 months or sooner if you have any problems.

## 2020-03-08 NOTE — Assessment & Plan Note (Addendum)
Severe disease.  He is quite limited.  Question whether there may be in part an anxiety component but I suspect that this is largely his underlying lung disease.  He is on Stiolto, tolerates.  Uses albuterol without much relief.  Question whether he may benefit from pulmonary rehab.  We discussed this today he is considering it but wants to hold off for now.  Need to repeat his ambulatory oximetry to ensure that he does not desaturate  Please continue Stiolto 2 puffs once a day as you have been taking it. Keep albuterol available to use 2 puffs when you need if shortness of breath, chest tightness, wheezing. Walking oximetry today We can consider referral to pulmonary rehab to build up your stamina, functional capacity. Let us know if you would like to do this Follow with Dr Lamonte Sakai in 3 months or sooner if you have any problems.

## 2020-03-08 NOTE — Progress Notes (Signed)
Subjective:    Patient ID: Todd Lloyd, male    DOB: 02/27/1941, 79 y.o.   MRN: [id number]  Shortness of Breath Associated symptoms include chest pain. Pertinent negatives include no ear pain, fever, headaches, leg swelling, rash, rhinorrhea, sore throat, vomiting or wheezing.   ROV 02/11/20 -39 year old man with severe COPD and associated hypoxemic respiratory failure.  He also has history of leukemia with a remote bone marrow transplant.  We have been following a right lower lobe spiculated nodule that had slowly increased in size in May.  Repeat CT performed 02/03/2020 reviewed by me, shows that his right lower lobe spiculated nodule has increased further in size of 2 x 1.3 cm. He is experiencing dyspnea, chest tightness. He is on Stiolto and Asmanex. Uses albuterol about 3x a day. Denies any coughing, does wheeze.   ROV 03/08/20 --[name] follows up today to review his PET scan.  He is 79 with severe COPD and associated hypoxemic respiratory failure.  Also has a history of leukemia with a remote bone marrow transplant.  He has a right lower lobe spiculated pulmonary nodule that is increasing in size, most recently on CT 02/03/2020. PET scan 02/24/2020 reviewed by me, shows peripheral right lower lobe nodule 1.7 x 1.1 cm that is hypermetabolic (SUV 4.7).  No mediastinal or hilar lymphadenopathy seen.  Still with severe exertional SOB, on Stiolto. Uses albuterol 3x a day - not sure it helps him much.    Review of Systems  Constitutional: Negative for fever and unexpected weight change.  HENT: Negative for congestion, dental problem, ear pain, nosebleeds, postnasal drip, rhinorrhea, sinus pressure, sneezing, sore throat and trouble swallowing.   Eyes: Negative for redness and itching.  Respiratory: Positive for shortness of breath. Negative for cough, chest tightness and wheezing.   Cardiovascular: Positive for chest pain. Negative for palpitations and leg swelling.  Gastrointestinal:  Negative for nausea and vomiting.  Genitourinary: Negative for dysuria.  Musculoskeletal: Negative for joint swelling.  Skin: Negative for rash.  Neurological: Negative for headaches.  Hematological: Does not bruise/bleed easily.  Psychiatric/Behavioral: Negative for dysphoric mood. The patient is not nervous/anxious.         Objective:   Physical Exam Vitals:   03/08/20 1113  BP: 118/72  Pulse: 94  Temp: 97.6 F (36.4 C)  TempSrc: Oral  SpO2: 94%  Weight: 202 lb 9.6 oz (91.9 kg)  Height: 6' (1.829 m)   Gen: Pleasant, elderly man, in no distress,  normal affect  ENT: No lesions,  mouth clear,  oropharynx clear, no postnasal drip  Neck: No JVD, no stridor  Lungs: No use of accessory muscles, distant, no wheeze   Cardiovascular: RRR, heart sounds normal, no murmur or gallops, no peripheral edema  Musculoskeletal: No deformities, no cyanosis or clubbing  Neuro: alert, somewhat flustered with poor memory of his meds, our previous plans.   Skin: Warm, no lesions or rashes      Assessment & Plan:  COPD (chronic obstructive pulmonary disease) (HCC) Severe disease.  He is quite limited.  Question whether there may be in part an anxiety component but I suspect that this is largely his underlying lung disease.  He is on Stiolto, tolerates.  Uses albuterol without much relief.  Question whether he may benefit from pulmonary rehab.  We discussed this today he is considering it but wants to hold off for now.  Need to repeat his ambulatory oximetry to ensure that he does not desaturate  Please continue Stiolto  2 puffs once a day as you have been taking it. Keep albuterol available to use 2 puffs when you need if shortness of breath, chest tightness, wheezing. Walking oximetry today We can consider referral to pulmonary rehab to build up your stamina, functional capacity. Let us know if you would like to do this Follow with Dr Lamonte Sakai in 3 months or sooner if you have any  problems.  Solitary pulmonary nodule Right lower lobe pulmonary nodule has enlarged on serial CTs, is hypermetabolic on PET scan, consistent with primary lung cancer.  He is high risk for transthoracic needle biopsy or bronchoscopy.  I will discuss his case with radiation oncology.  He may be a candidate for XRT without a tissue diagnosis.  Dr. Lamonte Sakai will discuss your CT scan, PET scan with radiation oncology to see if you are a candidate for radiation treatment.  Will call [name] to discuss further once films have been reviewed.  [name], MD, PhD 03/08/2020, 11:54 AM [facility] [phone] or if no answer [phone]

## 2020-03-08 NOTE — Assessment & Plan Note (Signed)
Right lower lobe pulmonary nodule has enlarged on serial CTs, is hypermetabolic on PET scan, consistent with primary lung cancer.  He is high risk for transthoracic needle biopsy or bronchoscopy.  I will discuss his case with radiation oncology.  He may be a candidate for XRT without a tissue diagnosis.  Dr. Lamonte Sakai will discuss your CT scan, PET scan with radiation oncology to see if you are a candidate for radiation treatment.  Will call [name] to discuss further once films have been reviewed.

## 2020-03-09 ENCOUNTER — Telehealth: Payer: Self-pay | Admitting: Neurology

## 2020-03-09 ENCOUNTER — Other Ambulatory Visit: Payer: Self-pay | Admitting: Emergency Medicine

## 2020-03-09 MED ORDER — PRAMIPEXOLE DIHYDROCHLORIDE 1 MG PO TABS: 1.0000 mg | ORAL_TABLET | Freq: Two times a day (BID) | ORAL | 1 refills | Status: DC

## 2020-03-09 NOTE — Telephone Encounter (Signed)
Pt called and scheduled a f/u appt. Pt is needing his pramipexole (MIRAPEX) 1 MG tablet sent in to the [facility] on [address].

## 2020-03-10 MED ORDER — PRAMIPEXOLE DIHYDROCHLORIDE 1 MG PO TABS: 1.0000 mg | ORAL_TABLET | Freq: Two times a day (BID) | ORAL | 0 refills | Status: DC

## 2020-03-10 NOTE — Telephone Encounter (Signed)
1 month supply has been sent pending pt's appt.

## 2020-03-11 ENCOUNTER — Other Ambulatory Visit: Payer: Self-pay | Admitting: *Deleted

## 2020-03-11 ENCOUNTER — Encounter: Payer: Self-pay | Admitting: Radiation Oncology

## 2020-03-11 NOTE — Progress Notes (Signed)
The proposed treatment discussed in cancer conference is for discussion purpose only and is not a binding recommendation. The patient was not physically examined nor present for their treatment options. Therefore, final treatment plans cannot be decided.  ?

## 2020-03-11 NOTE — Progress Notes (Signed)
The patient's case was discussed in multidisciplinary thoracic conference today.  Pulmonary medicine has followed the patient and has identified a hypermetabolic tumor within the right lung consistent with an early stage lung cancer.  Given the patient's comorbidities, it is felt that the patient may be a good candidate for stereotactic body radiation treatment.  Based on the size and location of the tumor, this treatment approach appears very feasible from a radiation standpoint.  Pulmonary medicine is going to discuss this option further with the patient and make a referral as appropriate.

## 2020-04-01 ENCOUNTER — Other Ambulatory Visit: Payer: Self-pay | Admitting: Neurology

## 2020-04-12 ENCOUNTER — Telehealth: Payer: Self-pay | Admitting: Emergency Medicine

## 2020-04-12 DIAGNOSIS — R911 Solitary pulmonary nodule: Secondary | ICD-10-CM

## 2020-04-12 NOTE — Telephone Encounter (Signed)
Spoke with the pt's daughter, Todd Lloyd  She states at last ov 03/08/20 RB had said that he was going to discuss pt with radiation oncology and then call her  She is just checking on the next step b/c she has not heard anything  She is asking if okay for pt to go ahead and get his covid booster now, given the fact he may be starting RT  Please advise, thanks!

## 2020-04-12 NOTE — Telephone Encounter (Signed)
Spoke with the pt's daughter, [name] and notified of response per Dr Lamonte Sakai  She verbalized understanding  Referral was already placed

## 2020-04-12 NOTE — Telephone Encounter (Signed)
Please let her know that I did discuss the case at Thoracic Conference, and referral to [facility] was recommended. I thought that I had done it but I must not have - apologize for that.  I will make the referral now.  It is ok for him to go ahead and get the booster.

## 2020-04-19 ENCOUNTER — Ambulatory Visit (INDEPENDENT_AMBULATORY_CARE_PROVIDER_SITE_OTHER): Payer: Medicare Other | Admitting: Neurology

## 2020-04-19 ENCOUNTER — Encounter: Payer: Self-pay | Admitting: Neurology

## 2020-04-19 VITALS — BP 118/72 | HR 74 | Ht 72.0 in | Wt 207.0 lb

## 2020-04-19 DIAGNOSIS — G609 Hereditary and idiopathic neuropathy, unspecified: Secondary | ICD-10-CM

## 2020-04-19 DIAGNOSIS — G2581 Restless legs syndrome: Secondary | ICD-10-CM | POA: Diagnosis not present

## 2020-04-19 MED ORDER — PRAMIPEXOLE DIHYDROCHLORIDE 1 MG PO TABS: 1.0000 mg | ORAL_TABLET | Freq: Two times a day (BID) | ORAL | 3 refills | Status: DC

## 2020-04-19 MED ORDER — BACLOFEN 5 MG PO TABS: 5.0000 mg | ORAL_TABLET | Freq: Two times a day (BID) | ORAL | 5 refills | Status: DC

## 2020-04-19 NOTE — Patient Instructions (Signed)
Try the Baclofen 5 mg twice daily for the muscle cramps Continue the Mirapex  See you back in 6 months

## 2020-04-19 NOTE — Progress Notes (Signed)
I have read the note, and I agree with the clinical assessment and plan.  Todd Lloyd K Todd Lloyd   
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
## 2020-04-21 NOTE — Progress Notes (Signed)
Thoracic Location of Tumor / Histology: Right Lower Lobe Lung  Patient presented for follow-up scan for pulmonary nodule that has been in surveillance.  PET 02/24/2020: Right lower lobe lung nodule is FDG avid consistent with primary bronchogenic carcinoma. The no signs of FDG avid hilar or mediastinal adenopathy or distant metastatic disease.  CT Chest 02/03/2020: Findings of enlarging RIGHT lower lobe lesion highly suspicious for bronchogenic neoplasm.  CT Chest 10/14/2019: Bilobed spiculated nodule in the RIGHT lower lobe measures 16 mm x 11 mm (image 84/3) compared with 12 mm x 11 mm.  Visually nodule appears appreciably enlarged.  Biopsies of -No biopsy  Tobacco/Marijuana/Snuff/ETOH use: Former smoker, quit in 2002.  Past/Anticipated interventions by Pulmonary, if any: Dr. Lamonte Sakai 03/08/2020 -Right lower lobe pulmonary nodule has enlarged on serial CTs, is hypermetabolic on PET scan, consistent with primary lung cancer.   -He is high risk for transthoracic needle biopsy or bronchoscopy.   -I will discuss his case with radiation oncology.  He may be a candidate for XRT without a tissue diagnosis.  Past/Anticipated interventions by cardiothoracic surgery, if any:   Past/Anticipated interventions by medical oncology, if any:   Signs/Symptoms  Weight changes, if any: Steady  Respiratory complaints, if any: None  Hemoptysis, if any: No  Pain issues, if any: No   SAFETY ISSUES:  Prior radiation? No  Pacemaker/ICD? No  Possible current pregnancy? n/a  Is the patient on methotrexate? No  Current Complaints / other details:

## 2020-04-22 ENCOUNTER — Encounter: Payer: Self-pay | Admitting: Radiation Oncology

## 2020-04-22 ENCOUNTER — Other Ambulatory Visit: Payer: Self-pay

## 2020-04-22 ENCOUNTER — Ambulatory Visit
Admission: RE | Admit: 2020-04-22 | Discharge: 2020-04-22 | Disposition: A | Discharge status: Home / Self Care | Payer: Medicare Other | Source: Ambulatory Visit | Attending: Radiation Oncology | Admitting: Radiation Oncology

## 2020-04-22 DIAGNOSIS — Z79899 Other long term (current) drug therapy: Secondary | ICD-10-CM | POA: Insufficient documentation

## 2020-04-22 DIAGNOSIS — Z87891 Personal history of nicotine dependence: Secondary | ICD-10-CM | POA: Insufficient documentation

## 2020-04-22 DIAGNOSIS — I10 Essential (primary) hypertension: Secondary | ICD-10-CM | POA: Diagnosis not present

## 2020-04-22 DIAGNOSIS — G2581 Restless legs syndrome: Secondary | ICD-10-CM | POA: Diagnosis not present

## 2020-04-22 DIAGNOSIS — C3431 Malignant neoplasm of lower lobe, right bronchus or lung: Secondary | ICD-10-CM | POA: Diagnosis not present

## 2020-04-22 DIAGNOSIS — K219 Gastro-esophageal reflux disease without esophagitis: Secondary | ICD-10-CM | POA: Insufficient documentation

## 2020-04-22 DIAGNOSIS — I4891 Unspecified atrial fibrillation: Secondary | ICD-10-CM | POA: Insufficient documentation

## 2020-04-22 DIAGNOSIS — J449 Chronic obstructive pulmonary disease, unspecified: Secondary | ICD-10-CM | POA: Insufficient documentation

## 2020-04-22 DIAGNOSIS — C9111 Chronic lymphocytic leukemia of B-cell type in remission: Secondary | ICD-10-CM | POA: Insufficient documentation

## 2020-04-22 DIAGNOSIS — E7800 Pure hypercholesterolemia, unspecified: Secondary | ICD-10-CM | POA: Diagnosis not present

## 2020-04-22 DIAGNOSIS — R918 Other nonspecific abnormal finding of lung field: Secondary | ICD-10-CM | POA: Insufficient documentation

## 2020-04-22 DIAGNOSIS — Z86010 Personal history of colonic polyps: Secondary | ICD-10-CM | POA: Insufficient documentation

## 2020-04-22 DIAGNOSIS — Z806 Family history of leukemia: Secondary | ICD-10-CM | POA: Insufficient documentation

## 2020-04-22 DIAGNOSIS — G629 Polyneuropathy, unspecified: Secondary | ICD-10-CM | POA: Insufficient documentation

## 2020-04-22 DIAGNOSIS — Z7901 Long term (current) use of anticoagulants: Secondary | ICD-10-CM | POA: Insufficient documentation

## 2020-04-22 DIAGNOSIS — Z9481 Bone marrow transplant status: Secondary | ICD-10-CM | POA: Insufficient documentation

## 2020-04-22 DIAGNOSIS — Z800 Family history of malignant neoplasm of digestive organs: Secondary | ICD-10-CM | POA: Insufficient documentation

## 2020-04-22 DIAGNOSIS — F0390 Unspecified dementia without behavioral disturbance: Secondary | ICD-10-CM | POA: Insufficient documentation

## 2020-04-22 NOTE — Addendum Note (Signed)
Encounter addended by: Hayden Pedro, PA-C on: 04/22/2020 11:44 AM  Actions taken: Clinical Note Signed
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
## 2020-05-04 ENCOUNTER — Ambulatory Visit
Admission: RE | Admit: 2020-05-04 | Discharge: 2020-05-04 | Disposition: A | Discharge status: Home / Self Care | Payer: Medicare Other | Source: Ambulatory Visit | Attending: Radiation Oncology | Admitting: Radiation Oncology

## 2020-05-04 ENCOUNTER — Other Ambulatory Visit: Payer: Self-pay

## 2020-05-04 DIAGNOSIS — Z87891 Personal history of nicotine dependence: Secondary | ICD-10-CM | POA: Diagnosis not present

## 2020-05-04 DIAGNOSIS — Z510 Encounter for antineoplastic radiation therapy: Secondary | ICD-10-CM | POA: Insufficient documentation

## 2020-05-04 DIAGNOSIS — C3431 Malignant neoplasm of lower lobe, right bronchus or lung: Secondary | ICD-10-CM | POA: Diagnosis not present

## 2020-05-07 DIAGNOSIS — Z23 Encounter for immunization: Secondary | ICD-10-CM | POA: Diagnosis not present

## 2020-05-09 DIAGNOSIS — Z87891 Personal history of nicotine dependence: Secondary | ICD-10-CM | POA: Diagnosis not present

## 2020-05-09 DIAGNOSIS — Z510 Encounter for antineoplastic radiation therapy: Secondary | ICD-10-CM | POA: Diagnosis not present

## 2020-05-09 DIAGNOSIS — C3431 Malignant neoplasm of lower lobe, right bronchus or lung: Secondary | ICD-10-CM | POA: Diagnosis not present

## 2020-05-13 ENCOUNTER — Other Ambulatory Visit: Payer: Self-pay

## 2020-05-13 ENCOUNTER — Ambulatory Visit
Admission: RE | Admit: 2020-05-13 | Discharge: 2020-05-13 | Disposition: A | Discharge status: Home / Self Care | Payer: Medicare Other | Source: Ambulatory Visit | Attending: Radiation Oncology | Admitting: Radiation Oncology

## 2020-05-13 DIAGNOSIS — C3431 Malignant neoplasm of lower lobe, right bronchus or lung: Secondary | ICD-10-CM | POA: Diagnosis not present

## 2020-05-13 DIAGNOSIS — Z87891 Personal history of nicotine dependence: Secondary | ICD-10-CM | POA: Diagnosis not present

## 2020-05-13 DIAGNOSIS — Z510 Encounter for antineoplastic radiation therapy: Secondary | ICD-10-CM | POA: Diagnosis not present

## 2020-05-17 ENCOUNTER — Ambulatory Visit
Admission: RE | Admit: 2020-05-17 | Discharge: 2020-05-17 | Disposition: A | Discharge status: Home / Self Care | Payer: Medicare Other | Source: Ambulatory Visit | Attending: Radiation Oncology | Admitting: Radiation Oncology

## 2020-05-17 DIAGNOSIS — Z510 Encounter for antineoplastic radiation therapy: Secondary | ICD-10-CM | POA: Diagnosis not present

## 2020-05-17 DIAGNOSIS — C3431 Malignant neoplasm of lower lobe, right bronchus or lung: Secondary | ICD-10-CM | POA: Diagnosis not present

## 2020-05-17 NOTE — Progress Notes (Signed)
Patient presented to the clinic requesting to speak with a nurse. Pulse ox 93% but other vitals are stable. Patient denies pain. Patient alert and oriented x 3. No distress noted. Patient explains his wife had a stroke and he is her caregiver. Patient explains he is wheezing a lot and has an occasional cough with yellow phlegm. Patient denies hemoptysis or difficulty swallowing. Patient reports a normal and good appetite. Patient explains he has never slept well. Patient verbalizes he is bothered by how easily he fatigues. He explains that getting dressed or other small task similar to this are exhausting. Patient denies receiving any chemotherapy at this time. Patient reports he has been unable to find in store his preferred multivitamin thus he hasn't been taking them.   After consulting with Dr. Tammi Klippel this RN explained his lungs look pretty clear on cone beam CT. Patient denies a history of cardiac conditions thus instructed patient to begin OTC Mucinex DM (per Dr. Tammi Klippel). Encourage patient to hydrate with water and liquids containing extra electrolytes such as gatorade, pedialyte, power aid. Explained a Producer, television/film/video shows his preferred MVI can be purchased at [employer]. Patient verbalized understanding of all reviewed. Patient exited the clinic ambulatory and in no distress.

## 2020-05-18 ENCOUNTER — Ambulatory Visit: Payer: Medicare Other | Admitting: Radiation Oncology

## 2020-05-19 ENCOUNTER — Encounter: Payer: Self-pay | Admitting: Radiation Oncology

## 2020-05-19 ENCOUNTER — Ambulatory Visit
Admission: RE | Admit: 2020-05-19 | Discharge: 2020-05-19 | Disposition: A | Discharge status: Home / Self Care | Payer: Medicare Other | Source: Ambulatory Visit | Attending: Radiation Oncology | Admitting: Radiation Oncology

## 2020-05-19 ENCOUNTER — Other Ambulatory Visit: Payer: Self-pay

## 2020-05-19 DIAGNOSIS — C3431 Malignant neoplasm of lower lobe, right bronchus or lung: Secondary | ICD-10-CM | POA: Diagnosis not present

## 2020-05-19 DIAGNOSIS — Z510 Encounter for antineoplastic radiation therapy: Secondary | ICD-10-CM | POA: Diagnosis not present

## 2020-06-01 DIAGNOSIS — M47816 Spondylosis without myelopathy or radiculopathy, lumbar region: Secondary | ICD-10-CM | POA: Diagnosis not present

## 2020-06-01 DIAGNOSIS — M48061 Spinal stenosis, lumbar region without neurogenic claudication: Secondary | ICD-10-CM | POA: Diagnosis not present

## 2020-06-09 ENCOUNTER — Telehealth: Payer: Self-pay | Admitting: Radiation Oncology

## 2020-06-09 DIAGNOSIS — C3431 Malignant neoplasm of lower lobe, right bronchus or lung: Secondary | ICD-10-CM

## 2020-06-09 NOTE — Telephone Encounter (Signed)
  Radiation Oncology         [phone] ________________________________  Name: Todd Lloyd MRN: [id number]  Date of Service: 06/09/2020  DOB: 1940/09/22  Post Treatment Telephone Note  Diagnosis:   Putative Stage IA2, cT1bN0M0, NSCLC of the RLL  Interval Since Last Radiation:  3 weeks   05/13/20-05/19/20 SBRT Treatment: The right lower lobe target was treated to 54 Gy in 3 fractions.  Narrative:  The patient was contacted today for routine follow-up. During treatment he did very well with radiotherapy and did not have significant desquamation. He reports he was fatigued from treatment. He reports he continues to have shortness of breath especially at night time after getting back in bed from using the restroom. In context however it sounds like he is able to take deep breaths and relax and this goes away. His daughter describes this complaint of his breathing as being something he fixates on and his PCP believes that this fixation is a manifestation of his dementia. That being said he has plans to follow up closely with PCP and pulmonary medicine.   Impression/Plan: 1.  Putative Stage IA2, cT1bN0M0, NSCLC of the RLL. The patient has been doing well since completion of radiotherapy. We discussed that we would plan to follow up with a repeat restaging CT scan in about 3-4 weeks. If stable post radiation change as we expect, that would indicate moving to q6 month CT scans and follow up per NCCN guidelines. The patient and his daughter were both on the call and are in agreement with this plan. 2. Dementia. We will follow this expectantly but at this time, he remains physically functional and able to take care of his wife and his own ADLs, but his complaints of shortness of breath need to be appropriately vetted if they occur, but also consider his dementia when trying to work up complaints too.     Carola Rhine, PAC

## 2020-06-19 NOTE — Progress Notes (Signed)
  Radiation Oncology         [phone] ________________________________  Name: Todd Lloyd MRN: [id number]  Date: 05/19/2020  DOB: [birth year]  End of Treatment Note  Diagnosis:  stage I non-small cell lung cancer of the right lung     Indication for treatment::  curative       Radiation treatment dates:   05/13/20 - 05/19/20  Site/dose:   The patient was treated to the right lung with a course of stereotactic body radiation treatment.  The patient received 54 Gray in 3 fractions using a IMRT/SBRT technique, with 3 fields.  Narrative: The patient tolerated radiation treatment relatively well.   No unexpected difficulties.  The patient's breathing did not significantly change during the course of the treatment.  Plan: The patient has completed radiation treatment. The patient will return to radiation oncology clinic for routine followup in one month. I advised the patient to call or return sooner if they have any questions or concerns related to their recovery or treatment. ________________________________  Jodelle Gross, M.D., Ph.D.

## 2020-07-06 ENCOUNTER — Other Ambulatory Visit: Payer: Self-pay

## 2020-07-06 ENCOUNTER — Ambulatory Visit (HOSPITAL_COMMUNITY)
Admission: RE | Admit: 2020-07-06 | Discharge: 2020-07-06 | Disposition: A | Discharge status: Home / Self Care | Payer: Medicare Other | Source: Ambulatory Visit | Attending: Radiation Oncology | Admitting: Radiation Oncology

## 2020-07-06 ENCOUNTER — Inpatient Hospital Stay: Payer: Medicare Other | Attending: Radiation Oncology

## 2020-07-06 DIAGNOSIS — C3490 Malignant neoplasm of unspecified part of unspecified bronchus or lung: Secondary | ICD-10-CM | POA: Diagnosis not present

## 2020-07-06 DIAGNOSIS — J984 Other disorders of lung: Secondary | ICD-10-CM | POA: Diagnosis not present

## 2020-07-06 DIAGNOSIS — C3431 Malignant neoplasm of lower lobe, right bronchus or lung: Secondary | ICD-10-CM | POA: Insufficient documentation

## 2020-07-06 DIAGNOSIS — J90 Pleural effusion, not elsewhere classified: Secondary | ICD-10-CM | POA: Diagnosis not present

## 2020-07-06 LAB — BUN & CREATININE (CHCC)
BUN: 22 mg/dL (ref 8–23)
Creatinine: 1.08 mg/dL (ref 0.61–1.24)
GFR, Estimated: >60 mL/min (ref >60)

## 2020-07-06 MED ORDER — IOHEXOL 300 MG/ML  SOLN
75.0000 mL | Freq: Once | INTRAMUSCULAR | Status: AC | PRN
  Administered 2020-07-06: 75 mL via INTRAVENOUS

## 2020-07-07 ENCOUNTER — Telehealth: Payer: Self-pay | Admitting: Radiation Oncology

## 2020-07-07 DIAGNOSIS — C3431 Malignant neoplasm of lower lobe, right bronchus or lung: Secondary | ICD-10-CM

## 2020-07-07 NOTE — Telephone Encounter (Signed)
I left a message for the patient and his daughter [name] about the findings on his recent CT scan and that I'm pleased to see improvement in the treated site. I do not see any concern for active cancer and the measurable nodule is most consistent with post radiation scaring that will continue and is expected to persist in the long term. He does have a new right effusion and during our last call had complained of increasing SOB at times, especially at night and getting up to go to the bathroom often. With this I've suggested he contact Dr. Oval Linsey his cardiologist to see if she is concerned about this given his cardiac history. Otherwise I have ordered another CT scan of the chest and will see him back after this in August 2022.    Carola Rhine, PAC

## 2020-07-08 ENCOUNTER — Telehealth: Payer: Self-pay | Admitting: Emergency Medicine

## 2020-07-08 MED ORDER — AZITHROMYCIN 250 MG PO TABS: ORAL_TABLET | ORAL | 0 refills | Status: DC

## 2020-07-08 MED ORDER — PREDNISONE 10 MG PO TABS: ORAL_TABLET | ORAL | 0 refills | Status: AC

## 2020-07-08 NOTE — Telephone Encounter (Signed)
LMTCB for the pt 

## 2020-07-08 NOTE — Telephone Encounter (Signed)
Thanks

## 2020-07-08 NOTE — Telephone Encounter (Signed)
Called and spoke with pt who states he has had increased SOB which has been worse now for at least 1 month. Pt also has an occ cough, wheezing, and chest discomfort.  Pt denies any complaints of fever as last temp was 98.0  Pt states that he has been having to use his rescue inhaler a lot and when had him specify how often, he stated at least 15-20 times a day.  Pt states he is using the asmanex inhaler as well as the stiolto inhaler as directed.  Asked pt if he had a nebulizer that he was using and pt stated that he does not.  Asked pt if he has had a covid test recently and he stated that he had not so I did advise pt that he needs to get a covid test done and then to call us with the results.  Pt wants to know if there are any other recommendations to help with his symptoms. Dr. Lamonte Sakai, please advise.

## 2020-07-08 NOTE — Telephone Encounter (Signed)
Called and spoke with pt letting him know the info stated by RB and he verbalized understanding. Rx for zpak and prednisone have been sent to pharmacy for pt. Nothing further needed.

## 2020-07-08 NOTE — Telephone Encounter (Signed)
I agree with checking the COVID. I think we should treat him for a possible flare as below. He needs to call us to let us know how he is responding  Pred >> Take 40mg  daily for 3 days, then 30mg  daily for 3 days, then 20mg  daily for 3 days, then 10mg  daily for 3 days, then stop  Azithro >> Z-pack

## 2020-07-09 NOTE — Telephone Encounter (Signed)
Called spoke with patient.  He is very confused about how is he doing. He didn't understand the medication was to be taken and then he should let us know if he isn't better.  I explained Dr. Agustina Caroli previous recommendations. He voiced understanding. I told him if he felt he could not catch his breath over the weekend and felt in danger he needed to call 911.  Patient is going to take prednisone and antibiotic then let us know Monday or Tuesday if he is any better or worse.

## 2020-07-15 NOTE — Telephone Encounter (Signed)
Is having swelling.  We can certainly repeat his echo but pleural effusion without pulmonary edema and no lower extremity edema makes me think more of primary lung issues than his heart.

## 2020-07-16 NOTE — Telephone Encounter (Signed)
Called and spoke to pt's daughter. She states the pt is doing okay but she thinks the pt's anxiety related to his dementia causes him to feel worse. She states the pt is waiting on cardiology to call regarding an appt. She denied needing any further assistance. Will close encounter.

## 2020-07-21 ENCOUNTER — Telehealth: Payer: Self-pay | Admitting: Cardiovascular Disease

## 2020-07-21 DIAGNOSIS — J90 Pleural effusion, not elsewhere classified: Secondary | ICD-10-CM

## 2020-07-21 DIAGNOSIS — R0602 Shortness of breath: Secondary | ICD-10-CM

## 2020-07-21 DIAGNOSIS — Z923 Personal history of irradiation: Secondary | ICD-10-CM

## 2020-07-21 NOTE — Telephone Encounter (Signed)
Spoke with daughter, patient does have shortness of breath Echo order placed and message sent to scheduling to arrange

## 2020-07-21 NOTE — Telephone Encounter (Signed)
I spoke with the patient's daughter [name] (ok per DPR). She advised that she received a call earlier today from [facility] to please call back regarding the patient.  However a little later, she had a text notificaton from [facility] about a RX from Dr. Oval Linsey.  I advised [name], I do not see where any new RX's have been sent in so I'm not sure where the text notification came from.  However, [name] advised they were expecting a call from Dr. Oval Linsey about a recent CT the patient had done (per Pulmonary) that showed some possible fluid.  I advised [name], I will forward this message to [place] as I do not see any specific documentation in the chart as to what she was calling about, but to expect a call back from [facility] [place].  [name] voices understanding and is agreeable.

## 2020-07-21 NOTE — Telephone Encounter (Signed)
Spoke with daughter and patient is having shortness of breath  Will proceed with Echo Order placed and message sent to scheduling to arrange

## 2020-07-21 NOTE — Telephone Encounter (Signed)
Left message to call back  

## 2020-07-21 NOTE — Telephone Encounter (Signed)
Patient's daughter states she received a notification stating, "review your prescription from Dr. Oval Linsey now."  She would like to know what this was regarding.

## 2020-07-22 ENCOUNTER — Other Ambulatory Visit: Payer: Self-pay | Admitting: Neurology

## 2020-07-26 ENCOUNTER — Inpatient Hospital Stay (HOSPITAL_COMMUNITY)
Admission: EM | Admit: 2020-07-26 | Discharge: 2020-08-20 | DRG: 871 | Disposition: A | Discharge status: Skilled Nursing Facility | Payer: Medicare Other | Attending: Internal Medicine | Admitting: Internal Medicine

## 2020-07-26 ENCOUNTER — Emergency Department (HOSPITAL_COMMUNITY): Payer: Medicare Other

## 2020-07-26 ENCOUNTER — Inpatient Hospital Stay (HOSPITAL_COMMUNITY): Payer: Medicare Other

## 2020-07-26 DIAGNOSIS — J9621 Acute and chronic respiratory failure with hypoxia: Secondary | ICD-10-CM | POA: Diagnosis present

## 2020-07-26 DIAGNOSIS — Z8719 Personal history of other diseases of the digestive system: Secondary | ICD-10-CM

## 2020-07-26 DIAGNOSIS — Z7189 Other specified counseling: Secondary | ICD-10-CM | POA: Diagnosis not present

## 2020-07-26 DIAGNOSIS — G253 Myoclonus: Secondary | ICD-10-CM | POA: Diagnosis present

## 2020-07-26 DIAGNOSIS — M6282 Rhabdomyolysis: Secondary | ICD-10-CM | POA: Diagnosis present

## 2020-07-26 DIAGNOSIS — R4701 Aphasia: Secondary | ICD-10-CM | POA: Diagnosis present

## 2020-07-26 DIAGNOSIS — I471 Supraventricular tachycardia: Secondary | ICD-10-CM | POA: Diagnosis not present

## 2020-07-26 DIAGNOSIS — R0902 Hypoxemia: Secondary | ICD-10-CM | POA: Diagnosis not present

## 2020-07-26 DIAGNOSIS — D72819 Decreased white blood cell count, unspecified: Secondary | ICD-10-CM | POA: Diagnosis not present

## 2020-07-26 DIAGNOSIS — R933 Abnormal findings on diagnostic imaging of other parts of digestive tract: Secondary | ICD-10-CM

## 2020-07-26 DIAGNOSIS — J449 Chronic obstructive pulmonary disease, unspecified: Secondary | ICD-10-CM | POA: Diagnosis present

## 2020-07-26 DIAGNOSIS — R6510 Systemic inflammatory response syndrome (SIRS) of non-infectious origin without acute organ dysfunction: Secondary | ICD-10-CM | POA: Diagnosis present

## 2020-07-26 DIAGNOSIS — Y92009 Unspecified place in unspecified non-institutional (private) residence as the place of occurrence of the external cause: Secondary | ICD-10-CM | POA: Diagnosis not present

## 2020-07-26 DIAGNOSIS — G2581 Restless legs syndrome: Secondary | ICD-10-CM | POA: Diagnosis present

## 2020-07-26 DIAGNOSIS — Z9049 Acquired absence of other specified parts of digestive tract: Secondary | ICD-10-CM

## 2020-07-26 DIAGNOSIS — A419 Sepsis, unspecified organism: Secondary | ICD-10-CM | POA: Diagnosis present

## 2020-07-26 DIAGNOSIS — E861 Hypovolemia: Secondary | ICD-10-CM | POA: Diagnosis present

## 2020-07-26 DIAGNOSIS — Z856 Personal history of leukemia: Secondary | ICD-10-CM

## 2020-07-26 DIAGNOSIS — M2550 Pain in unspecified joint: Secondary | ICD-10-CM | POA: Diagnosis not present

## 2020-07-26 DIAGNOSIS — R6521 Severe sepsis with septic shock: Secondary | ICD-10-CM | POA: Diagnosis present

## 2020-07-26 DIAGNOSIS — I959 Hypotension, unspecified: Secondary | ICD-10-CM | POA: Diagnosis present

## 2020-07-26 DIAGNOSIS — I700 Atherosclerosis of aorta: Secondary | ICD-10-CM | POA: Diagnosis not present

## 2020-07-26 DIAGNOSIS — D62 Acute posthemorrhagic anemia: Secondary | ICD-10-CM | POA: Diagnosis present

## 2020-07-26 DIAGNOSIS — J9811 Atelectasis: Secondary | ICD-10-CM | POA: Diagnosis not present

## 2020-07-26 DIAGNOSIS — I129 Hypertensive chronic kidney disease with stage 1 through stage 4 chronic kidney disease, or unspecified chronic kidney disease: Secondary | ICD-10-CM | POA: Diagnosis present

## 2020-07-26 DIAGNOSIS — D631 Anemia in chronic kidney disease: Secondary | ICD-10-CM | POA: Diagnosis present

## 2020-07-26 DIAGNOSIS — S0003XA Contusion of scalp, initial encounter: Secondary | ICD-10-CM | POA: Diagnosis not present

## 2020-07-26 DIAGNOSIS — J9690 Respiratory failure, unspecified, unspecified whether with hypoxia or hypercapnia: Secondary | ICD-10-CM

## 2020-07-26 DIAGNOSIS — D72829 Elevated white blood cell count, unspecified: Secondary | ICD-10-CM | POA: Diagnosis not present

## 2020-07-26 DIAGNOSIS — I517 Cardiomegaly: Secondary | ICD-10-CM | POA: Diagnosis not present

## 2020-07-26 DIAGNOSIS — K5730 Diverticulosis of large intestine without perforation or abscess without bleeding: Secondary | ICD-10-CM | POA: Diagnosis present

## 2020-07-26 DIAGNOSIS — S50311A Abrasion of right elbow, initial encounter: Secondary | ICD-10-CM | POA: Diagnosis present

## 2020-07-26 DIAGNOSIS — I493 Ventricular premature depolarization: Secondary | ICD-10-CM | POA: Diagnosis not present

## 2020-07-26 DIAGNOSIS — M6281 Muscle weakness (generalized): Secondary | ICD-10-CM | POA: Diagnosis not present

## 2020-07-26 DIAGNOSIS — R748 Abnormal levels of other serum enzymes: Secondary | ICD-10-CM | POA: Diagnosis not present

## 2020-07-26 DIAGNOSIS — S0990XA Unspecified injury of head, initial encounter: Secondary | ICD-10-CM

## 2020-07-26 DIAGNOSIS — S199XXA Unspecified injury of neck, initial encounter: Secondary | ICD-10-CM | POA: Diagnosis not present

## 2020-07-26 DIAGNOSIS — A040 Enteropathogenic Escherichia coli infection: Secondary | ICD-10-CM | POA: Diagnosis present

## 2020-07-26 DIAGNOSIS — J9602 Acute respiratory failure with hypercapnia: Secondary | ICD-10-CM | POA: Diagnosis present

## 2020-07-26 DIAGNOSIS — N170 Acute kidney failure with tubular necrosis: Secondary | ICD-10-CM | POA: Diagnosis present

## 2020-07-26 DIAGNOSIS — E8770 Fluid overload, unspecified: Secondary | ICD-10-CM | POA: Diagnosis not present

## 2020-07-26 DIAGNOSIS — D509 Iron deficiency anemia, unspecified: Secondary | ICD-10-CM | POA: Diagnosis not present

## 2020-07-26 DIAGNOSIS — R935 Abnormal findings on diagnostic imaging of other abdominal regions, including retroperitoneum: Secondary | ICD-10-CM | POA: Diagnosis not present

## 2020-07-26 DIAGNOSIS — J90 Pleural effusion, not elsewhere classified: Secondary | ICD-10-CM | POA: Diagnosis not present

## 2020-07-26 DIAGNOSIS — Z79899 Other long term (current) drug therapy: Secondary | ICD-10-CM

## 2020-07-26 DIAGNOSIS — Z85118 Personal history of other malignant neoplasm of bronchus and lung: Secondary | ICD-10-CM

## 2020-07-26 DIAGNOSIS — Z20822 Contact with and (suspected) exposure to covid-19: Secondary | ICD-10-CM | POA: Diagnosis present

## 2020-07-26 DIAGNOSIS — E875 Hyperkalemia: Secondary | ICD-10-CM | POA: Diagnosis present

## 2020-07-26 DIAGNOSIS — R4182 Altered mental status, unspecified: Secondary | ICD-10-CM | POA: Diagnosis not present

## 2020-07-26 DIAGNOSIS — M25559 Pain in unspecified hip: Secondary | ICD-10-CM | POA: Diagnosis not present

## 2020-07-26 DIAGNOSIS — Z8249 Family history of ischemic heart disease and other diseases of the circulatory system: Secondary | ICD-10-CM

## 2020-07-26 DIAGNOSIS — I480 Paroxysmal atrial fibrillation: Secondary | ICD-10-CM | POA: Diagnosis present

## 2020-07-26 DIAGNOSIS — N1832 Chronic kidney disease, stage 3b: Secondary | ICD-10-CM | POA: Diagnosis present

## 2020-07-26 DIAGNOSIS — Z66 Do not resuscitate: Secondary | ICD-10-CM | POA: Diagnosis present

## 2020-07-26 DIAGNOSIS — R911 Solitary pulmonary nodule: Secondary | ICD-10-CM | POA: Diagnosis present

## 2020-07-26 DIAGNOSIS — I482 Chronic atrial fibrillation, unspecified: Secondary | ICD-10-CM | POA: Diagnosis present

## 2020-07-26 DIAGNOSIS — K633 Ulcer of intestine: Secondary | ICD-10-CM | POA: Diagnosis present

## 2020-07-26 DIAGNOSIS — D649 Anemia, unspecified: Secondary | ICD-10-CM | POA: Diagnosis not present

## 2020-07-26 DIAGNOSIS — R578 Other shock: Secondary | ICD-10-CM | POA: Diagnosis not present

## 2020-07-26 DIAGNOSIS — E7800 Pure hypercholesterolemia, unspecified: Secondary | ICD-10-CM | POA: Diagnosis present

## 2020-07-26 DIAGNOSIS — W19XXXA Unspecified fall, initial encounter: Secondary | ICD-10-CM | POA: Diagnosis present

## 2020-07-26 DIAGNOSIS — K529 Noninfective gastroenteritis and colitis, unspecified: Secondary | ICD-10-CM | POA: Diagnosis not present

## 2020-07-26 DIAGNOSIS — K219 Gastro-esophageal reflux disease without esophagitis: Secondary | ICD-10-CM | POA: Diagnosis present

## 2020-07-26 DIAGNOSIS — J441 Chronic obstructive pulmonary disease with (acute) exacerbation: Secondary | ICD-10-CM

## 2020-07-26 DIAGNOSIS — G319 Degenerative disease of nervous system, unspecified: Secondary | ICD-10-CM | POA: Diagnosis not present

## 2020-07-26 DIAGNOSIS — G9341 Metabolic encephalopathy: Secondary | ICD-10-CM | POA: Diagnosis present

## 2020-07-26 DIAGNOSIS — G9389 Other specified disorders of brain: Secondary | ICD-10-CM | POA: Diagnosis not present

## 2020-07-26 DIAGNOSIS — R0603 Acute respiratory distress: Secondary | ICD-10-CM

## 2020-07-26 DIAGNOSIS — I10 Essential (primary) hypertension: Secondary | ICD-10-CM | POA: Diagnosis not present

## 2020-07-26 DIAGNOSIS — Z7901 Long term (current) use of anticoagulants: Secondary | ICD-10-CM

## 2020-07-26 DIAGNOSIS — F0390 Unspecified dementia without behavioral disturbance: Secondary | ICD-10-CM | POA: Diagnosis present

## 2020-07-26 DIAGNOSIS — R195 Other fecal abnormalities: Secondary | ICD-10-CM | POA: Diagnosis not present

## 2020-07-26 DIAGNOSIS — M7989 Other specified soft tissue disorders: Secondary | ICD-10-CM | POA: Diagnosis not present

## 2020-07-26 DIAGNOSIS — M47816 Spondylosis without myelopathy or radiculopathy, lumbar region: Secondary | ICD-10-CM | POA: Diagnosis not present

## 2020-07-26 DIAGNOSIS — Z87891 Personal history of nicotine dependence: Secondary | ICD-10-CM

## 2020-07-26 DIAGNOSIS — I509 Heart failure, unspecified: Secondary | ICD-10-CM | POA: Diagnosis not present

## 2020-07-26 DIAGNOSIS — J439 Emphysema, unspecified: Secondary | ICD-10-CM | POA: Diagnosis present

## 2020-07-26 DIAGNOSIS — G629 Polyneuropathy, unspecified: Secondary | ICD-10-CM | POA: Diagnosis present

## 2020-07-26 DIAGNOSIS — R404 Transient alteration of awareness: Secondary | ICD-10-CM | POA: Diagnosis not present

## 2020-07-26 DIAGNOSIS — M4312 Spondylolisthesis, cervical region: Secondary | ICD-10-CM | POA: Diagnosis not present

## 2020-07-26 DIAGNOSIS — K635 Polyp of colon: Secondary | ICD-10-CM | POA: Diagnosis not present

## 2020-07-26 DIAGNOSIS — S50312A Abrasion of left elbow, initial encounter: Secondary | ICD-10-CM | POA: Diagnosis present

## 2020-07-26 DIAGNOSIS — R7989 Other specified abnormal findings of blood chemistry: Secondary | ICD-10-CM | POA: Diagnosis present

## 2020-07-26 DIAGNOSIS — D6959 Other secondary thrombocytopenia: Secondary | ICD-10-CM | POA: Diagnosis present

## 2020-07-26 DIAGNOSIS — Z86010 Personal history of colonic polyps: Secondary | ICD-10-CM

## 2020-07-26 DIAGNOSIS — Z6832 Body mass index (BMI) 32.0-32.9, adult: Secondary | ICD-10-CM

## 2020-07-26 DIAGNOSIS — R5381 Other malaise: Secondary | ICD-10-CM | POA: Diagnosis not present

## 2020-07-26 DIAGNOSIS — R918 Other nonspecific abnormal finding of lung field: Secondary | ICD-10-CM | POA: Diagnosis not present

## 2020-07-26 DIAGNOSIS — J9622 Acute and chronic respiratory failure with hypercapnia: Secondary | ICD-10-CM | POA: Diagnosis present

## 2020-07-26 DIAGNOSIS — Z7401 Bed confinement status: Secondary | ICD-10-CM | POA: Diagnosis not present

## 2020-07-26 DIAGNOSIS — A09 Infectious gastroenteritis and colitis, unspecified: Secondary | ICD-10-CM | POA: Diagnosis not present

## 2020-07-26 DIAGNOSIS — K921 Melena: Secondary | ICD-10-CM | POA: Diagnosis not present

## 2020-07-26 DIAGNOSIS — Z9841 Cataract extraction status, right eye: Secondary | ICD-10-CM

## 2020-07-26 DIAGNOSIS — K559 Vascular disorder of intestine, unspecified: Secondary | ICD-10-CM | POA: Diagnosis present

## 2020-07-26 DIAGNOSIS — E872 Acidosis: Secondary | ICD-10-CM | POA: Diagnosis present

## 2020-07-26 DIAGNOSIS — J9601 Acute respiratory failure with hypoxia: Secondary | ICD-10-CM | POA: Diagnosis not present

## 2020-07-26 DIAGNOSIS — R41841 Cognitive communication deficit: Secondary | ICD-10-CM | POA: Diagnosis not present

## 2020-07-26 DIAGNOSIS — N179 Acute kidney failure, unspecified: Secondary | ICD-10-CM | POA: Diagnosis not present

## 2020-07-26 DIAGNOSIS — R579 Shock, unspecified: Secondary | ICD-10-CM | POA: Diagnosis not present

## 2020-07-26 DIAGNOSIS — G9340 Encephalopathy, unspecified: Secondary | ICD-10-CM

## 2020-07-26 DIAGNOSIS — Z888 Allergy status to other drugs, medicaments and biological substances status: Secondary | ICD-10-CM

## 2020-07-26 DIAGNOSIS — Z9481 Bone marrow transplant status: Secondary | ICD-10-CM | POA: Diagnosis not present

## 2020-07-26 DIAGNOSIS — S80212A Abrasion, left knee, initial encounter: Secondary | ICD-10-CM | POA: Diagnosis present

## 2020-07-26 DIAGNOSIS — Z806 Family history of leukemia: Secondary | ICD-10-CM

## 2020-07-26 DIAGNOSIS — Z801 Family history of malignant neoplasm of trachea, bronchus and lung: Secondary | ICD-10-CM

## 2020-07-26 DIAGNOSIS — K449 Diaphragmatic hernia without obstruction or gangrene: Secondary | ICD-10-CM | POA: Diagnosis present

## 2020-07-26 DIAGNOSIS — R498 Other voice and resonance disorders: Secondary | ICD-10-CM | POA: Diagnosis not present

## 2020-07-26 DIAGNOSIS — R1031 Right lower quadrant pain: Secondary | ICD-10-CM | POA: Diagnosis not present

## 2020-07-26 DIAGNOSIS — M5033 Other cervical disc degeneration, cervicothoracic region: Secondary | ICD-10-CM | POA: Diagnosis not present

## 2020-07-26 DIAGNOSIS — Z7951 Long term (current) use of inhaled steroids: Secondary | ICD-10-CM

## 2020-07-26 DIAGNOSIS — I4891 Unspecified atrial fibrillation: Secondary | ICD-10-CM | POA: Diagnosis not present

## 2020-07-26 DIAGNOSIS — S80211A Abrasion, right knee, initial encounter: Secondary | ICD-10-CM | POA: Diagnosis present

## 2020-07-26 DIAGNOSIS — R102 Pelvic and perineal pain: Secondary | ICD-10-CM | POA: Diagnosis not present

## 2020-07-26 DIAGNOSIS — N200 Calculus of kidney: Secondary | ICD-10-CM | POA: Diagnosis not present

## 2020-07-26 DIAGNOSIS — M79662 Pain in left lower leg: Secondary | ICD-10-CM | POA: Diagnosis not present

## 2020-07-26 DIAGNOSIS — E669 Obesity, unspecified: Secondary | ICD-10-CM | POA: Diagnosis present

## 2020-07-26 DIAGNOSIS — R2681 Unsteadiness on feet: Secondary | ICD-10-CM | POA: Diagnosis not present

## 2020-07-26 DIAGNOSIS — Z9889 Other specified postprocedural states: Secondary | ICD-10-CM | POA: Diagnosis not present

## 2020-07-26 DIAGNOSIS — R29898 Other symptoms and signs involving the musculoskeletal system: Secondary | ICD-10-CM | POA: Diagnosis not present

## 2020-07-26 DIAGNOSIS — Z885 Allergy status to narcotic agent status: Secondary | ICD-10-CM

## 2020-07-26 DIAGNOSIS — Z923 Personal history of irradiation: Secondary | ICD-10-CM

## 2020-07-26 DIAGNOSIS — Z515 Encounter for palliative care: Secondary | ICD-10-CM | POA: Diagnosis not present

## 2020-07-26 DIAGNOSIS — E860 Dehydration: Secondary | ICD-10-CM | POA: Diagnosis present

## 2020-07-26 DIAGNOSIS — G8929 Other chronic pain: Secondary | ICD-10-CM | POA: Diagnosis not present

## 2020-07-26 DIAGNOSIS — R2689 Other abnormalities of gait and mobility: Secondary | ICD-10-CM | POA: Diagnosis not present

## 2020-07-26 DIAGNOSIS — N5089 Other specified disorders of the male genital organs: Secondary | ICD-10-CM | POA: Diagnosis not present

## 2020-07-26 DIAGNOSIS — Z9842 Cataract extraction status, left eye: Secondary | ICD-10-CM

## 2020-07-26 LAB — CBC
HCT: 34.9 % — ABNORMAL LOW (ref 39.0–52.0)
Hemoglobin: 9.8 g/dL — ABNORMAL LOW (ref 13.0–17.0)
MCH: 26.8 pg (ref 26.0–34.0)
MCHC: 28.1 g/dL — ABNORMAL LOW (ref 30.0–36.0)
MCV: 95.4 fL (ref 80.0–100.0)
Platelets: UNDETERMINED 10*3/uL (ref 150–400)
RBC: 3.66 MIL/uL — ABNORMAL LOW (ref 4.22–5.81)
RDW: 18.2 % — ABNORMAL HIGH (ref 11.5–15.5)
WBC: 21.2 10*3/uL — ABNORMAL HIGH (ref 4.0–10.5)
nRBC: 0 % (ref 0.0–0.2)

## 2020-07-26 LAB — COMPREHENSIVE METABOLIC PANEL
ALT: 67 U/L — ABNORMAL HIGH (ref 0–44)
AST: 131 U/L — ABNORMAL HIGH (ref 15–41)
Albumin: 3.1 g/dL — ABNORMAL LOW (ref 3.5–5.0)
Alkaline Phosphatase: 46 U/L (ref 38–126)
Anion gap: 10 (ref 5–15)
BUN: 46 mg/dL — ABNORMAL HIGH (ref 8–23)
CO2: 22 mmol/L (ref 22–32)
Calcium: 7.5 mg/dL — ABNORMAL LOW (ref 8.9–10.3)
Chloride: 106 mmol/L (ref 98–111)
Creatinine, Ser: 3.57 mg/dL — ABNORMAL HIGH (ref 0.61–1.24)
GFR, Estimated: 17 mL/min — ABNORMAL LOW (ref >60)
Glucose, Bld: 84 mg/dL (ref 70–99)
Potassium: 4.6 mmol/L (ref 3.5–5.1)
Sodium: 138 mmol/L (ref 135–145)
Total Bilirubin: 1 mg/dL (ref 0.3–1.2)
Total Protein: 5.6 g/dL — ABNORMAL LOW (ref 6.5–8.1)

## 2020-07-26 LAB — URINALYSIS, ROUTINE W REFLEX MICROSCOPIC
Bilirubin Urine: NEGATIVE
Glucose, UA: NEGATIVE mg/dL
Ketones, ur: NEGATIVE mg/dL
Leukocytes,Ua: NEGATIVE
Nitrite: NEGATIVE
Protein, ur: 100 mg/dL — AB
Specific Gravity, Urine: 1.016 (ref 1.005–1.030)
pH: 5 (ref 5.0–8.0)

## 2020-07-26 LAB — I-STAT ARTERIAL BLOOD GAS, ED
Acid-base deficit: 3 mmol/L — ABNORMAL HIGH (ref 0.0–2.0)
Acid-base deficit: 5 mmol/L — ABNORMAL HIGH (ref 0.0–2.0)
Bicarbonate: 27.2 mmol/L (ref 20.0–28.0)
Bicarbonate: 25.3 mmol/L (ref 20.0–28.0)
Calcium, Ion: 1.17 mmol/L (ref 1.15–1.40)
Calcium, Ion: 1.12 mmol/L — ABNORMAL LOW (ref 1.15–1.40)
HCT: 33 % — ABNORMAL LOW (ref 39.0–52.0)
HCT: 32 % — ABNORMAL LOW (ref 39.0–52.0)
Hemoglobin: 11.2 g/dL — ABNORMAL LOW (ref 13.0–17.0)
Hemoglobin: 10.9 g/dL — ABNORMAL LOW (ref 13.0–17.0)
O2 Saturation: 98 %
O2 Saturation: 99 %
Patient temperature: 37.9
Patient temperature: 99.7
Potassium: 4.5 mmol/L (ref 3.5–5.1)
Potassium: 4.9 mmol/L (ref 3.5–5.1)
Sodium: 143 mmol/L (ref 135–145)
Sodium: 142 mmol/L (ref 135–145)
TCO2: 29 mmol/L (ref 22–32)
TCO2: 28 mmol/L (ref 22–32)
pCO2 arterial: 80.8 mmHg (ref 32.0–48.0)
pCO2 arterial: 81.1 mmHg (ref 32.0–48.0)
pH, Arterial: 7.14 — CL (ref 7.350–7.450)
pH, Arterial: 7.106 — CL (ref 7.350–7.450)
pO2, Arterial: 155 mmHg — ABNORMAL HIGH (ref 83.0–108.0)
pO2, Arterial: 170 mmHg — ABNORMAL HIGH (ref 83.0–108.0)

## 2020-07-26 LAB — I-STAT CHEM 8, ED
BUN: 49 mg/dL — ABNORMAL HIGH (ref 8–23)
Calcium, Ion: 0.94 mmol/L — ABNORMAL LOW (ref 1.15–1.40)
Chloride: 108 mmol/L (ref 98–111)
Creatinine, Ser: 3.6 mg/dL — ABNORMAL HIGH (ref 0.61–1.24)
Glucose, Bld: 81 mg/dL (ref 70–99)
HCT: 33 % — ABNORMAL LOW (ref 39.0–52.0)
Hemoglobin: 11.2 g/dL — ABNORMAL LOW (ref 13.0–17.0)
Potassium: 4.7 mmol/L (ref 3.5–5.1)
Sodium: 141 mmol/L (ref 135–145)
TCO2: 22 mmol/L (ref 22–32)

## 2020-07-26 LAB — RESP PANEL BY RT-PCR (FLU A&B, COVID) ARPGX2
Influenza A by PCR: NEGATIVE
Influenza B by PCR: NEGATIVE
SARS Coronavirus 2 by RT PCR: NEGATIVE

## 2020-07-26 LAB — RAPID URINE DRUG SCREEN, HOSP PERFORMED
Amphetamines: NOT DETECTED
Barbiturates: NOT DETECTED
Benzodiazepines: NOT DETECTED
Cocaine: NOT DETECTED
Opiates: NOT DETECTED
Tetrahydrocannabinol: NOT DETECTED

## 2020-07-26 LAB — I-STAT VENOUS BLOOD GAS, ED
Acid-base deficit: 2 mmol/L (ref 0.0–2.0)
Bicarbonate: 23.4 mmol/L (ref 20.0–28.0)
Calcium, Ion: 0.96 mmol/L — ABNORMAL LOW (ref 1.15–1.40)
HCT: 33 % — ABNORMAL LOW (ref 39.0–52.0)
Hemoglobin: 11.2 g/dL — ABNORMAL LOW (ref 13.0–17.0)
O2 Saturation: 86 %
Potassium: 4.7 mmol/L (ref 3.5–5.1)
Sodium: 142 mmol/L (ref 135–145)
TCO2: 25 mmol/L (ref 22–32)
pCO2, Ven: 42.3 mmHg — ABNORMAL LOW (ref 44.0–60.0)
pH, Ven: 7.352 (ref 7.250–7.430)
pO2, Ven: 55 mmHg — ABNORMAL HIGH (ref 32.0–45.0)

## 2020-07-26 LAB — SAMPLE TO BLOOD BANK

## 2020-07-26 LAB — TROPONIN I (HIGH SENSITIVITY): Troponin I (High Sensitivity): 433 ng/L (ref <18)

## 2020-07-26 LAB — CK
Total CK: 4945 U/L — ABNORMAL HIGH (ref 49–397)
Total CK: 7306 U/L — ABNORMAL HIGH (ref 49–397)

## 2020-07-26 LAB — DIGOXIN LEVEL: Digoxin Level: 1.7 ng/mL (ref 0.8–2.0)

## 2020-07-26 LAB — PROTIME-INR
INR: 1.7 — ABNORMAL HIGH (ref 0.8–1.2)
Prothrombin Time: 19.2 seconds — ABNORMAL HIGH (ref 11.4–15.2)

## 2020-07-26 LAB — BRAIN NATRIURETIC PEPTIDE: B Natriuretic Peptide: 505.5 pg/mL — ABNORMAL HIGH (ref 0.0–100.0)

## 2020-07-26 LAB — LACTIC ACID, PLASMA
Lactic Acid, Venous: 1.3 mmol/L (ref 0.5–1.9)
Lactic Acid, Venous: 1.3 mmol/L (ref 0.5–1.9)

## 2020-07-26 LAB — PROCALCITONIN: Procalcitonin: 26.97 ng/mL

## 2020-07-26 LAB — ETHANOL: Alcohol, Ethyl (B): <10 mg/dL (ref <10)

## 2020-07-26 LAB — CBG MONITORING, ED: Glucose-Capillary: 80 mg/dL (ref 70–99)

## 2020-07-26 LAB — APTT: aPTT: 35 seconds (ref 24–36)

## 2020-07-26 LAB — URIC ACID: Uric Acid, Serum: 10 mg/dL — ABNORMAL HIGH (ref 3.7–8.6)

## 2020-07-26 MED ORDER — DOCUSATE SODIUM 100 MG PO CAPS: 100.0000 mg | ORAL_CAPSULE | Freq: Two times a day (BID) | ORAL | Status: DC | PRN

## 2020-07-26 MED ORDER — LACTATED RINGERS IV BOLUS
1800.0000 mL | Freq: Once | INTRAVENOUS | Status: AC
  Administered 2020-07-26: 1800 mL via INTRAVENOUS

## 2020-07-26 MED ORDER — SODIUM CHLORIDE 0.9 % IV BOLUS: 1000.0000 mL | Freq: Once | INTRAVENOUS | Status: DC

## 2020-07-26 MED ORDER — FUROSEMIDE 10 MG/ML IJ SOLN
40.0000 mg | Freq: Once | INTRAMUSCULAR | Status: AC
  Administered 2020-07-26: 40 mg via INTRAVENOUS
  Filled 2020-07-26: qty 4

## 2020-07-26 MED ORDER — METRONIDAZOLE IN NACL 5-0.79 MG/ML-% IV SOLN
500.0000 mg | Freq: Once | INTRAVENOUS | Status: AC
  Administered 2020-07-26: 500 mg via INTRAVENOUS
  Filled 2020-07-26: qty 100

## 2020-07-26 MED ORDER — SODIUM CHLORIDE 0.9 % IV BOLUS
1000.0000 mL | Freq: Once | INTRAVENOUS | Status: AC
  Administered 2020-07-26: 1000 mL via INTRAVENOUS

## 2020-07-26 MED ORDER — SODIUM CHLORIDE 0.9 % IV SOLN
2.0000 g | Freq: Three times a day (TID) | INTRAVENOUS | Status: DC
  Administered 2020-07-26 – 2020-07-27 (×3): 2 g via INTRAVENOUS
  Filled 2020-07-26: qty 2000
  Filled 2020-07-26: qty 2
  Filled 2020-07-26 (×3): qty 2000

## 2020-07-26 MED ORDER — SODIUM CHLORIDE 0.9 % IV SOLN
2.0000 g | Freq: Once | INTRAVENOUS | Status: DC
  Administered 2020-07-26: 2 g via INTRAVENOUS
  Filled 2020-07-26: qty 2

## 2020-07-26 MED ORDER — POLYETHYLENE GLYCOL 3350 17 G PO PACK
17.0000 g | PACK | Freq: Every day | ORAL | Status: DC | PRN
  Administered 2020-08-16 – 2020-08-20 (×2): 17 g via ORAL
  Filled 2020-07-26: qty 1

## 2020-07-26 MED ORDER — ARFORMOTEROL TARTRATE 15 MCG/2ML IN NEBU
15.0000 ug | INHALATION_SOLUTION | Freq: Two times a day (BID) | RESPIRATORY_TRACT | Status: DC
  Filled 2020-07-26: qty 2

## 2020-07-26 MED ORDER — NOREPINEPHRINE 4 MG/250ML-% IV SOLN
2.0000 ug/min | INTRAVENOUS | Status: DC
  Administered 2020-07-26: 2 ug/min via INTRAVENOUS
  Administered 2020-07-27: 3 ug/min via INTRAVENOUS
  Filled 2020-07-26 (×2): qty 250

## 2020-07-26 MED ORDER — ACETAMINOPHEN 650 MG RE SUPP
650.0000 mg | Freq: Once | RECTAL | Status: AC
  Administered 2020-07-26: 650 mg via RECTAL
  Filled 2020-07-26: qty 1

## 2020-07-26 MED ORDER — VANCOMYCIN HCL 1750 MG/350ML IV SOLN
1750.0000 mg | Freq: Once | INTRAVENOUS | Status: AC
  Administered 2020-07-26: 1750 mg via INTRAVENOUS
  Filled 2020-07-26: qty 350

## 2020-07-26 MED ORDER — INSULIN ASPART 100 UNIT/ML ~~LOC~~ SOLN: 0.0000 [IU] | SUBCUTANEOUS | Status: DC

## 2020-07-26 MED ORDER — IPRATROPIUM-ALBUTEROL 0.5-2.5 (3) MG/3ML IN SOLN
3.0000 mL | Freq: Four times a day (QID) | RESPIRATORY_TRACT | Status: DC
  Administered 2020-07-27 – 2020-08-02 (×25): 3 mL via RESPIRATORY_TRACT
  Filled 2020-07-26 (×26): qty 3

## 2020-07-26 MED ORDER — DEXTROSE 5 % IV SOLN
10.0000 mg/kg | INTRAVENOUS | Status: DC
  Administered 2020-07-26: 775 mg via INTRAVENOUS
  Filled 2020-07-26 (×3): qty 15.5

## 2020-07-26 MED ORDER — VANCOMYCIN VARIABLE DOSE PER UNSTABLE RENAL FUNCTION (PHARMACIST DOSING): Status: DC

## 2020-07-26 MED ORDER — ALBUMIN HUMAN 25 % IV SOLN
25.0000 g | Freq: Four times a day (QID) | INTRAVENOUS | Status: AC
  Administered 2020-07-26 – 2020-07-27 (×2): 25 g via INTRAVENOUS
  Filled 2020-07-26 (×3): qty 100

## 2020-07-26 MED ORDER — ALBUTEROL SULFATE (2.5 MG/3ML) 0.083% IN NEBU
2.5000 mg | INHALATION_SOLUTION | RESPIRATORY_TRACT | Status: DC | PRN
  Administered 2020-08-10 – 2020-08-14 (×4): 2.5 mg via RESPIRATORY_TRACT
  Filled 2020-07-26 (×4): qty 3

## 2020-07-26 MED ORDER — SODIUM CHLORIDE 0.9 % IV SOLN
250.0000 mL | INTRAVENOUS | Status: DC
  Administered 2020-07-26: 250 mL via INTRAVENOUS

## 2020-07-26 MED ORDER — PANTOPRAZOLE SODIUM 40 MG IV SOLR
40.0000 mg | INTRAVENOUS | Status: DC
  Administered 2020-07-27 – 2020-07-29 (×3): 40 mg via INTRAVENOUS
  Filled 2020-07-26 (×3): qty 40

## 2020-07-26 MED ORDER — BUDESONIDE 0.5 MG/2ML IN SUSP
0.5000 mg | Freq: Two times a day (BID) | RESPIRATORY_TRACT | Status: DC
  Administered 2020-07-27 – 2020-08-19 (×47): 0.5 mg via RESPIRATORY_TRACT
  Filled 2020-07-26 (×50): qty 2

## 2020-07-26 MED ORDER — SODIUM CHLORIDE 0.9 % IV SOLN
2.0000 g | Freq: Two times a day (BID) | INTRAVENOUS | Status: DC
  Administered 2020-07-26 – 2020-07-27 (×2): 2 g via INTRAVENOUS
  Filled 2020-07-26 (×2): qty 20

## 2020-07-26 NOTE — Progress Notes (Signed)
RT obtained ABG but brackets appeared under the pH of 7.06 and CO2 of 81. RT attempted to obtain another ABG to rerun with RN holding pt hand but pt became combative and would not allow RT to get ABG. RN aware.

## 2020-07-26 NOTE — ED Notes (Signed)
Per provider, this RN to stop fluids and give patient lasix. Provider still wants lasix given with patient being hypotensive (BP 75/38). Providers at bedside at this time.provider also wants Foley to be placed. 1 hour after giving lasix, if patient still not urinating, this Rn to let provider know.

## 2020-07-26 NOTE — Sepsis Progress Note (Signed)
Code sepsis protocol being monitored by elink

## 2020-07-26 NOTE — ED Provider Notes (Signed)
  Face-to-face evaluation   History: Patient reportedly fell around 9 AM this morning.  His wife was with him but unable to call for assistance because her nonverbal status.  Patient was ultimately able to summon EMS when his wife got a phone to him.  On arrival to the ED he was a level 2 trauma, fall on blood thinners with altered mental status.  I saw the patient at 5:32 PM after he returned from CT imaging.  I spoke to him and he told me his name.  His speech was dysarthric.  He had equal handgrip.  He was unable to give additional history.  Physical exam: Alert to voice, opens eyes.  Dysarthria is present.  Normal strength handgrip.  No facial asymmetry.  Contusion right eye with mild lid swelling, and erythema of the sclera superiorly pupils equal round reactive to light.  EOMI.  Medical screening examination/treatment/procedure(s) were conducted as a shared visit with non-physician practitioner(s) and myself.  I personally evaluated the patient during the encounter    Daleen Bo, MD 07/26/20 [id number]

## 2020-07-26 NOTE — Progress Notes (Signed)
RT transported pt to and from CT without event.

## 2020-07-26 NOTE — Progress Notes (Signed)
Orthopedic Tech Progress Note Patient Details:  Todd Lloyd 1940-10-24 [id number] LEVEL 2 TRAUMA Patient ID: Todd Lloyd, male   DOB: December 20, 1940, 80 y.o.   MRN: [id number]   Todd Lloyd 07/26/2020, 5:23 PM

## 2020-07-26 NOTE — ED Triage Notes (Signed)
Pt here after unwitnessed fall. Down since around 0915, called family at 49 today to tell them. Pt found on the floor, mottled and altered. Pt placed on NRB with ems with good change in color. Pt with abrasions to bil knees, and shortening and rotation to LLE.  Pt is on eliquis. Last bp 95/47.
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
## 2020-07-26 NOTE — Progress Notes (Signed)
Pharmacy Antibiotic Note  Todd Lloyd is a 80 y.o. male admitted on 07/26/2020 with sepsis and meningitis.  Pharmacy has been consulted for Acyclovir, Ampicillin, Ceftriaxone, and Vancomycin dosing.   Height: 6' (182.9 cm) Weight: 96.2 kg (212 lb) IBW/kg (Calculated) : 77.6  Temp (24hrs), Avg:101.3 F (38.5 C), Min:100.2 F (37.9 C), Max:102.3 F (39.1 C)  Recent Labs  Lab 07/26/20 1727 07/26/20 1748  WBC 21.2*  --   CREATININE 3.57* 3.60*  LATICACIDVEN 1.3  --     Estimated Creatinine Clearance: 19.7 mL/min (A) (by C-G formula based on SCr of 3.6 mg/dL (H)).    Allergies  Allergen Reactions  . Lyrica [Pregabalin]     confusion  . Morphine And Related     confusion    Antimicrobials this admission: 3/7 Acyclovir >> 3/7 Ampicillin >> 3/7 Ceftriaxone >>  3/7 Vancomycin >>   Dose adjustments this admission: N/a  Microbiology results: Pending   Plan:  - Acyclovir 775 mg IV q24h (10mg /kg IBW)  - Ampicillin 2g IV q8h - Ceftriaxone 2g IV q12h - Vancomycin 1750mg  IV x 1 dose  - Continue to dose based on random levels due to AKI baseline Scr ~ 1.1 - Monitor patients renal function and urine output  - De-escalate ABX when appropriate   Thank you for allowing pharmacy to be a part of this patient's care.  Duanne Limerick PharmD. BCPS 07/26/2020 6:53 PM

## 2020-07-26 NOTE — ED Notes (Signed)
Level 2 activated per cacavalle PA

## 2020-07-27 ENCOUNTER — Inpatient Hospital Stay (HOSPITAL_COMMUNITY): Payer: Medicare Other

## 2020-07-27 DIAGNOSIS — R748 Abnormal levels of other serum enzymes: Secondary | ICD-10-CM | POA: Diagnosis present

## 2020-07-27 DIAGNOSIS — J9602 Acute respiratory failure with hypercapnia: Secondary | ICD-10-CM | POA: Diagnosis not present

## 2020-07-27 DIAGNOSIS — J441 Chronic obstructive pulmonary disease with (acute) exacerbation: Secondary | ICD-10-CM | POA: Diagnosis not present

## 2020-07-27 DIAGNOSIS — I509 Heart failure, unspecified: Secondary | ICD-10-CM | POA: Diagnosis not present

## 2020-07-27 DIAGNOSIS — Z7901 Long term (current) use of anticoagulants: Secondary | ICD-10-CM | POA: Diagnosis not present

## 2020-07-27 DIAGNOSIS — R4182 Altered mental status, unspecified: Secondary | ICD-10-CM

## 2020-07-27 DIAGNOSIS — E8770 Fluid overload, unspecified: Secondary | ICD-10-CM

## 2020-07-27 DIAGNOSIS — I959 Hypotension, unspecified: Secondary | ICD-10-CM

## 2020-07-27 DIAGNOSIS — N179 Acute kidney failure, unspecified: Secondary | ICD-10-CM | POA: Diagnosis not present

## 2020-07-27 LAB — POCT I-STAT 7, (LYTES, BLD GAS, ICA,H+H)
Acid-base deficit: 4 mmol/L — ABNORMAL HIGH (ref 0.0–2.0)
Acid-base deficit: 4 mmol/L — ABNORMAL HIGH (ref 0.0–2.0)
Acid-base deficit: 3 mmol/L — ABNORMAL HIGH (ref 0.0–2.0)
Bicarbonate: 26.1 mmol/L (ref 20.0–28.0)
Bicarbonate: 26.3 mmol/L (ref 20.0–28.0)
Bicarbonate: 24.1 mmol/L (ref 20.0–28.0)
Calcium, Ion: 1.11 mmol/L — ABNORMAL LOW (ref 1.15–1.40)
Calcium, Ion: 1.11 mmol/L — ABNORMAL LOW (ref 1.15–1.40)
Calcium, Ion: 1.04 mmol/L — ABNORMAL LOW (ref 1.15–1.40)
HCT: 32 % — ABNORMAL LOW (ref 39.0–52.0)
HCT: 32 % — ABNORMAL LOW (ref 39.0–52.0)
HCT: 30 % — ABNORMAL LOW (ref 39.0–52.0)
Hemoglobin: 10.9 g/dL — ABNORMAL LOW (ref 13.0–17.0)
Hemoglobin: 10.9 g/dL — ABNORMAL LOW (ref 13.0–17.0)
Hemoglobin: 10.2 g/dL — ABNORMAL LOW (ref 13.0–17.0)
O2 Saturation: 100 %
O2 Saturation: 45 %
O2 Saturation: 99 %
Patient temperature: 97.9
Potassium: 5.1 mmol/L (ref 3.5–5.1)
Potassium: 5.3 mmol/L — ABNORMAL HIGH (ref 3.5–5.1)
Potassium: 4.6 mmol/L (ref 3.5–5.1)
Sodium: 140 mmol/L (ref 135–145)
Sodium: 141 mmol/L (ref 135–145)
Sodium: 143 mmol/L (ref 135–145)
TCO2: 28 mmol/L (ref 22–32)
TCO2: 29 mmol/L (ref 22–32)
TCO2: 26 mmol/L (ref 22–32)
pCO2 arterial: 76.2 mmHg (ref 32.0–48.0)
pCO2 arterial: 78.9 mmHg (ref 32.0–48.0)
pCO2 arterial: 51.9 mmHg — ABNORMAL HIGH (ref 32.0–48.0)
pH, Arterial: 7.131 — CL (ref 7.350–7.450)
pH, Arterial: 7.143 — CL (ref 7.350–7.450)
pH, Arterial: 7.273 — ABNORMAL LOW (ref 7.350–7.450)
pO2, Arterial: 257 mmHg — ABNORMAL HIGH (ref 83.0–108.0)
pO2, Arterial: 34 mmHg — CL (ref 83.0–108.0)
pO2, Arterial: 134 mmHg — ABNORMAL HIGH (ref 83.0–108.0)

## 2020-07-27 LAB — CBC
HCT: 33.8 % — ABNORMAL LOW (ref 39.0–52.0)
Hemoglobin: 9.7 g/dL — ABNORMAL LOW (ref 13.0–17.0)
MCH: 27.2 pg (ref 26.0–34.0)
MCHC: 28.7 g/dL — ABNORMAL LOW (ref 30.0–36.0)
MCV: 94.9 fL (ref 80.0–100.0)
Platelets: 116 10*3/uL — ABNORMAL LOW (ref 150–400)
RBC: 3.56 MIL/uL — ABNORMAL LOW (ref 4.22–5.81)
RDW: 18.6 % — ABNORMAL HIGH (ref 11.5–15.5)
WBC: 25.8 10*3/uL — ABNORMAL HIGH (ref 4.0–10.5)
nRBC: 0 % (ref 0.0–0.2)

## 2020-07-27 LAB — BLOOD GAS, ARTERIAL
Acid-base deficit: 5.9 mmol/L — ABNORMAL HIGH (ref 0.0–2.0)
Bicarbonate: 21.9 mmol/L (ref 20.0–28.0)
FIO2: 45
O2 Saturation: 97.3 %
Patient temperature: 36.6
pCO2 arterial: 66.8 mmHg (ref 32.0–48.0)
pH, Arterial: 7.14 — CL (ref 7.350–7.450)
pO2, Arterial: 115 mmHg — ABNORMAL HIGH (ref 83.0–108.0)

## 2020-07-27 LAB — GASTROINTESTINAL PANEL BY PCR, STOOL (REPLACES STOOL CULTURE)
Adenovirus F40/41: NOT DETECTED
Astrovirus: NOT DETECTED
Campylobacter species: NOT DETECTED
Cryptosporidium: NOT DETECTED
Cyclospora cayetanensis: NOT DETECTED
Entamoeba histolytica: NOT DETECTED
Enteroaggregative E coli (EAEC): NOT DETECTED
Enteropathogenic E coli (EPEC): DETECTED — AB
Enterotoxigenic E coli (ETEC): NOT DETECTED
Giardia lamblia: NOT DETECTED
Norovirus GI/GII: NOT DETECTED
Plesimonas shigelloides: NOT DETECTED
Rotavirus A: DETECTED — AB
Salmonella species: NOT DETECTED
Sapovirus (I, II, IV, and V): NOT DETECTED
Shiga like toxin producing E coli (STEC): NOT DETECTED
Shigella/Enteroinvasive E coli (EIEC): NOT DETECTED
Vibrio cholerae: NOT DETECTED
Vibrio species: NOT DETECTED
Yersinia enterocolitica: NOT DETECTED

## 2020-07-27 LAB — PROCALCITONIN: Procalcitonin: 39.52 ng/mL

## 2020-07-27 LAB — ECHOCARDIOGRAM LIMITED
Calc EF: 46.8 %
Height: 72 in
S' Lateral: 3.8 cm
Single Plane A2C EF: 49.3 %
Single Plane A4C EF: 45.1 %
Weight: 3432.12 oz

## 2020-07-27 LAB — HEPATIC FUNCTION PANEL
ALT: 86 U/L — ABNORMAL HIGH (ref 0–44)
AST: 231 U/L — ABNORMAL HIGH (ref 15–41)
Albumin: 3.4 g/dL — ABNORMAL LOW (ref 3.5–5.0)
Alkaline Phosphatase: 42 U/L (ref 38–126)
Bilirubin, Direct: 0.3 mg/dL — ABNORMAL HIGH (ref 0.0–0.2)
Indirect Bilirubin: 0.5 mg/dL (ref 0.3–0.9)
Total Bilirubin: 0.8 mg/dL (ref 0.3–1.2)
Total Protein: 5.7 g/dL — ABNORMAL LOW (ref 6.5–8.1)

## 2020-07-27 LAB — BASIC METABOLIC PANEL
Anion gap: 13 (ref 5–15)
Anion gap: 12 (ref 5–15)
BUN: 52 mg/dL — ABNORMAL HIGH (ref 8–23)
BUN: 59 mg/dL — ABNORMAL HIGH (ref 8–23)
CO2: 19 mmol/L — ABNORMAL LOW (ref 22–32)
CO2: 25 mmol/L (ref 22–32)
Calcium: 7.2 mg/dL — ABNORMAL LOW (ref 8.9–10.3)
Calcium: 7.6 mg/dL — ABNORMAL LOW (ref 8.9–10.3)
Chloride: 107 mmol/L (ref 98–111)
Chloride: 104 mmol/L (ref 98–111)
Creatinine, Ser: 3.89 mg/dL — ABNORMAL HIGH (ref 0.61–1.24)
Creatinine, Ser: 4.71 mg/dL — ABNORMAL HIGH (ref 0.61–1.24)
GFR, Estimated: 15 mL/min — ABNORMAL LOW (ref >60)
GFR, Estimated: 12 mL/min — ABNORMAL LOW (ref >60)
Glucose, Bld: 105 mg/dL — ABNORMAL HIGH (ref 70–99)
Glucose, Bld: 114 mg/dL — ABNORMAL HIGH (ref 70–99)
Potassium: 5.4 mmol/L — ABNORMAL HIGH (ref 3.5–5.1)
Potassium: 4.6 mmol/L (ref 3.5–5.1)
Sodium: 139 mmol/L (ref 135–145)
Sodium: 141 mmol/L (ref 135–145)

## 2020-07-27 LAB — CK
Total CK: 10573 U/L — ABNORMAL HIGH (ref 49–397)
Total CK: 11849 U/L — ABNORMAL HIGH (ref 49–397)
Total CK: 12472 U/L — ABNORMAL HIGH (ref 49–397)

## 2020-07-27 LAB — HEPATITIS PANEL, ACUTE
HCV Ab: NONREACTIVE
Hep A IgM: NONREACTIVE
Hep B C IgM: NONREACTIVE
Hepatitis B Surface Ag: NONREACTIVE

## 2020-07-27 LAB — GLUCOSE, CAPILLARY
Glucose-Capillary: 86 mg/dL (ref 70–99)
Glucose-Capillary: 103 mg/dL — ABNORMAL HIGH (ref 70–99)
Glucose-Capillary: 96 mg/dL (ref 70–99)
Glucose-Capillary: 76 mg/dL (ref 70–99)
Glucose-Capillary: 74 mg/dL (ref 70–99)
Glucose-Capillary: 76 mg/dL (ref 70–99)

## 2020-07-27 LAB — MAGNESIUM: Magnesium: 1.9 mg/dL (ref 1.7–2.4)

## 2020-07-27 LAB — C DIFFICILE QUICK SCREEN W PCR REFLEX
C Diff antigen: NEGATIVE
C Diff interpretation: NOT DETECTED
C Diff toxin: NEGATIVE

## 2020-07-27 LAB — TROPONIN I (HIGH SENSITIVITY): Troponin I (High Sensitivity): 522 ng/L (ref <18)

## 2020-07-27 LAB — PHOSPHORUS: Phosphorus: 7.5 mg/dL — ABNORMAL HIGH (ref 2.5–4.6)

## 2020-07-27 LAB — MRSA PCR SCREENING: MRSA by PCR: NEGATIVE

## 2020-07-27 MED ORDER — FUROSEMIDE 10 MG/ML IJ SOLN
40.0000 mg | Freq: Once | INTRAMUSCULAR | Status: AC
  Administered 2020-07-27: 40 mg via INTRAVENOUS
  Filled 2020-07-27: qty 4

## 2020-07-27 MED ORDER — SODIUM CHLORIDE 0.9 % IV SOLN
2.0000 g | INTRAVENOUS | Status: DC
  Administered 2020-07-28: 2 g via INTRAVENOUS
  Filled 2020-07-27: qty 20

## 2020-07-27 MED ORDER — LACTATED RINGERS IV SOLN
INTRAVENOUS | Status: DC
  Administered 2020-07-27: 20:00:00 100 mL/h via INTRAVENOUS

## 2020-07-27 MED ORDER — ORAL CARE MOUTH RINSE
15.0000 mL | Freq: Two times a day (BID) | OROMUCOSAL | Status: DC
  Administered 2020-07-27 – 2020-08-20 (×27): 15 mL via OROMUCOSAL

## 2020-07-27 MED ORDER — SODIUM BICARBONATE 8.4 % IV SOLN
INTRAVENOUS | Status: DC
  Filled 2020-07-27 (×5): qty 850

## 2020-07-27 MED ORDER — FUROSEMIDE 10 MG/ML IJ SOLN
40.0000 mg | Freq: Once | INTRAMUSCULAR | Status: DC
  Filled 2020-07-27: qty 4

## 2020-07-27 MED ORDER — CHLORHEXIDINE GLUCONATE CLOTH 2 % EX PADS
6.0000 | MEDICATED_PAD | Freq: Every day | CUTANEOUS | Status: DC
  Administered 2020-07-27 – 2020-07-28 (×3): 6 via TOPICAL

## 2020-07-27 MED ORDER — DEXTROSE 50 % IV SOLN
INTRAVENOUS | Status: AC
  Filled 2020-07-27: qty 50

## 2020-07-27 MED ORDER — METRONIDAZOLE IN NACL 5-0.79 MG/ML-% IV SOLN
500.0000 mg | Freq: Once | INTRAVENOUS | Status: AC
  Administered 2020-07-27: 500 mg via INTRAVENOUS
  Filled 2020-07-27: qty 100

## 2020-07-27 MED ORDER — ALBUMIN HUMAN 5 % IV SOLN
25.0000 g | Freq: Once | INTRAVENOUS | Status: AC
  Administered 2020-07-27: 25 g via INTRAVENOUS
  Filled 2020-07-27: qty 500

## 2020-07-27 MED ORDER — CHLORHEXIDINE GLUCONATE 0.12 % MT SOLN
15.0000 mL | Freq: Two times a day (BID) | OROMUCOSAL | Status: DC
  Administered 2020-07-27 – 2020-08-20 (×36): 15 mL via OROMUCOSAL
  Filled 2020-07-27 (×32): qty 15

## 2020-07-27 MED ORDER — FUROSEMIDE 10 MG/ML IJ SOLN
40.0000 mg | Freq: Once | INTRAMUSCULAR | Status: AC
  Administered 2020-07-27: 40 mg via INTRAVENOUS

## 2020-07-27 MED ORDER — CALCIUM GLUCONATE-NACL 2-0.675 GM/100ML-% IV SOLN
2.0000 g | Freq: Once | INTRAVENOUS | Status: AC
  Administered 2020-07-27: 2000 mg via INTRAVENOUS
  Filled 2020-07-27: qty 100

## 2020-07-27 MED ORDER — HEPARIN (PORCINE) 25000 UT/250ML-% IV SOLN
1200.0000 [IU]/h | INTRAVENOUS | Status: AC
  Administered 2020-07-27: 1450 [IU]/h via INTRAVENOUS
  Administered 2020-07-28 – 2020-07-29 (×2): 1100 [IU]/h via INTRAVENOUS
  Filled 2020-07-27 (×3): qty 250

## 2020-07-27 NOTE — Progress Notes (Signed)
Pt's 9767 HALP 37 repeat with different glucometer and hand, new reading 76. Pt is on levophed gtt. On pt's glucose @ 1457 114. Informed Lacretia Nicks, NP. Ok to check BS VIA PIV.

## 2020-07-27 NOTE — Progress Notes (Signed)
Had Hayden Pedro, NP look at patients right hand that infiltrated. No new orders at this time.
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
## 2020-07-27 NOTE — Progress Notes (Signed)
NAME:  Todd Lloyd, MRN:  [id number], DOB:  April 28, 1941, LOS: 1 ADMISSION DATE:  07/26/2020, CONSULTATION DATE:  07/26/2020 REFERRING MD:  Dr. Eulis Foster , CHIEF COMPLAINT:  Encephalopathy, Hypotension    Brief History  80 yo PMH COPD, lung ca s/p radiation, admitted 07/26/20 with AMS after fall.  Has had diarrhea-- got dizzy when he got up to use restroom, slipped, and remained down.  AKI, rhabdo, sepsis, shock (septic + hypovolemia), hypercarbic and hypoxic resp failure on BiPAP  Past Medical History:  COPD (followed by Dr. 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   Significant Hospital Events:  3/7 > Presents to ED 3/8> Neuro consulted for AMS. improving mentation with metabolic correction, continues on BiPAP. Incr Bicarb gtt  Consults:  PCCM Neuro Procedures:    Significant Diagnostic Tests:  CT Head 3/7 > 1. No evidence of acute intracranial abnormality. 2. Left frontal scalp contusion without acute fracture. CT C-Spine 3/7 > 1. No evidence of acute fracture. 2. Rotation of C1 on C2 is likely positional in the absence of a fixed torticollis. 3. Bulky facet hypertrophy at multiple levels, greatest on the left at C4-C5 where there is likely at least moderate bony foraminal stenosis. 4. Left eccentric degenerative disc disease in the lower cervical spine, greatest at C7-T1. CT a/p 3/7> focal colitis without evidence of perf. Non obstructing L renal stone. Calcified L adrenal hematoma. Stable RLL mass lesion, associated R effusion CXR 3/7 > Cardiac shadow is mildly enlarged but stable. Aortic calcifications are seen. Known right lower lobe mass lesion is not well appreciated on this exam. No infiltrate or sizable effusion is seen. No acute bony abnormality is noted. EEG 3/8>>  Micro Data:  Blood 3/7  >> Urine 3/7 >> COVID 3/7 > Negative  3/7 CDiff>neg 3/8 GI PCR >>   Antimicrobials:  Acyclovir/Ampicillin/Rocephin/Vanomycin/Flagyl 3/7>   Interim History / Subjective:  Improving mentation Hypercarbia is improving  Objective   Blood pressure (!) 105/51, pulse 68, temperature 97.9 F (36.6 C), temperature source Axillary, resp. rate (!) 28, height 6' (1.829 m), weight 97.3 kg, SpO2 100 %.    FiO2 (%):  [45 %-50 %] 45 %   Intake/Output Summary (Last 24 hours) at 07/27/2020 0847 Last data filed at 07/27/2020 2947 Gross per 24 hour  Intake 3506.24 ml  Output 275 ml  Net 3231.24 ml   Filed Weights   07/26/20 1846 07/27/20 0049 07/27/20 0155  Weight: 96.2 kg 97.3 kg 97.3 kg    Examination: General: elderly M, reclined in bed NAD on BiPAP  HENT: NCAT dry tacky mm anicteric sclera  Lungs: Symmetrical chest expansion, NPPV supported sounds, no accessory use  Cardiovascular: irreg rhythm, reg rate. s1s2 2+ pulses  Abdomen: soft round ndnt. Hyperactive sounds  Extremities: BLE edema. No acute joint deformity. Tender BLE to touch,  Neuro: Awake, conversant. Following commands.  GU: Foley, Yellow urine   Resolved Hospital Problem list     Assessment & Plan:   Acute respiratory failure with hypoxia and hypercarbia - improving  COPD Stg 1 R Lung Ca s/p radiation -follows with Dr. Lamonte Sakai  P -cont BiPAP, will hopefully try to wean off 3/8 -NPO on BiPAP  -duoneb, pulmicort, albuterol   Acute metabolic encephalopathy - improving  -septic shock, AKI, hypercarbia -CT Head with left frontal scalp contusion  Plan -Neuro was consulted -EEG -with improving mentation, will defer MRI at this time to continue BiPAP   Shock-- suspect septic, +/- component of hypovolemia in setting of diarrhea PTA -Previous ECHO 02/2020 with EF 60-65, LV normal function  -hx diarrhea PTA -- ? GI infection -with AMS, was started on empiric meningitis coverage.  Plan -Adding LR 127ml/hr -cont  low-dose NE (no indication at present for CVC) -follow cx data  -LP ordered-- will d/w CCM MD, wondering if with improved mentation we can dc this -on acyclovir, ampicillin, rocephin, vanc, flagyl -- de-escalate as able   AKI Rhabdomyolysis, rising CK -fell on way to bathroom, remained on ground for extended period  Plan -Trend renal indices, CK -Foley, strict I/O to guide medical therapy -Incr Bicarb gtt to 113ml/hr -Adding LR 100/hr. Suspect will need further volume resusc  Hyperkalemia, mild, improving  Hyperphosphatemia Hypocalcemia P -repeat BMP to eval for possible K correction -replace Ca  Afib Plan -Hold Digoxin and Eliquis at this time --if we hold on LP, can start heparin (if NPO) vs eliquis  -ICU monitoring  -electrolyte optimization   Best practice (evaluated daily)  Diet: NPO DVT prophylaxis: SCD GI prophylaxis: PPI Glucose control: SSI Mobility: Bedrest Disposition:Admit to ICU   Goals of Care:  Last date of multidisciplinary goals of care discussion: CCM NP spoke with family overnight on admission -- daughter is retired [employer] surgeon and would like to be included in discussions RE dialysis or intubation should these interventions become recommended.  Family and staff present:  Summary of discussion:  Follow up goals of care discussion due:  Code Status: Full Code   Labs   CBC: Recent Labs  Lab 07/26/20 1727 07/26/20 1748 07/26/20 1757 07/26/20 2206 07/27/20 0136 07/27/20 0254 07/27/20 0301  WBC 21.2*  --   --   --  25.8*  --   --   HGB 9.8*   < > 11.2* 10.9* 9.7* 10.9* 10.9*  HCT 34.9*   < > 33.0* 32.0* 33.8* 32.0* 32.0*  MCV 95.4  --   --   --  94.9  --   --   PLT PLATELET CLUMPS NOTED ON SMEAR, UNABLE TO ESTIMATE  --   --   --  116*  --   --    < > = values in this interval not displayed.    Basic Metabolic Panel: Recent Labs  Lab 07/26/20 1727 07/26/20 1748 07/26/20 1757 07/26/20 2206 07/27/20 0136 07/27/20 0254 07/27/20 0301  NA 138  142  141 143 142 139 140 141  K 4.6 4.7  4.7 4.5 4.9 5.4* 5.1 5.3*  CL 106 108  --   --  107  --   --   CO2 22  --   --   --  19*  --   --   GLUCOSE 84 81  --   --  105*  --   --   BUN 46* 49*  --   --  52*  --   --   CREATININE 3.57* 3.60*  --   --  3.89*  --   --   CALCIUM 7.5*  --   --   --  7.2*  --   --   MG  --   --   --   --  1.9  --   --   PHOS  --   --   --   --  7.5*  --   --  GFR: Estimated Creatinine Clearance: 18.3 mL/min (A) (by C-G formula based on SCr of 3.89 mg/dL (H)). Recent Labs  Lab 07/26/20 1727 07/26/20 2042 07/26/20 2206 07/27/20 0136  PROCALCITON  --  26.97  --  39.52  WBC 21.2*  --   --  25.8*  LATICACIDVEN 1.3  --  1.3  --     Liver Function Tests: Recent Labs  Lab 07/26/20 1727 07/27/20 0136  AST 131* 231*  ALT 67* 86*  ALKPHOS 46 42  BILITOT 1.0 0.8  PROT 5.6* 5.7*  ALBUMIN 3.1* 3.4*   No results for input(s): LIPASE, AMYLASE in the last 168 hours. No results for input(s): AMMONIA in the last 168 hours.  ABG    Component Value Date/Time   PHART 7.140 (LL) 07/27/2020 0444   PCO2ART 66.8 (HH) 07/27/2020 0444   PO2ART 115 (H) 07/27/2020 0444   HCO3 21.9 07/27/2020 0444   TCO2 28 07/27/2020 0301   ACIDBASEDEF 5.9 (H) 07/27/2020 0444   O2SAT 97.3 07/27/2020 0444     Coagulation Profile: Recent Labs  Lab 07/26/20 1802  INR 1.7*    Cardiac Enzymes: Recent Labs  Lab 07/26/20 1727 07/26/20 2059 07/27/20 0136  CKTOTAL 4,945* 7,306* 11,849*  10,573*    HbA1C: No results found for: HGBA1C  CBG: Recent Labs  Lab 07/26/20 2151 07/27/20 0812  GLUCAP 80 86    CRITICAL CARE Performed by: Cristal Generous   Total critical care time: 43 minutes  Critical care time was exclusive of separately billable procedures and treating other patients. Critical care was necessary to treat or prevent imminent or life-threatening deterioration.  Critical care was time spent personally by me on the following activities: development  of treatment plan with patient and/or surrogate as well as nursing, discussions with consultants, evaluation of patient's response to treatment, examination of patient, obtaining history from patient or surrogate, ordering and performing treatments and interventions, ordering and review of laboratory studies, ordering and review of radiographic studies, pulse oximetry and re-evaluation of patient's condition.  Eliseo Gum MSN, AGACNP-BC [place] [phone] If no answer, [phone] 07/27/2020, 8:48 AM

## 2020-07-27 NOTE — Procedures (Addendum)
Patient Name: Todd Lloyd  MRN: [id number]  Epilepsy Attending: Lora Havens  Referring Physician/Provider: Dr. Kerney Elbe Date: 07/27/2020 Duration: 23.04 mins  Patient history: 80yo M with ams. EEG to evaluate for seizure  Level of alertness: Awake  AEDs during EEG study: None  Technical aspects: This EEG study was done with scalp electrodes positioned according to the 10-20 International system of electrode placement. Electrical activity was acquired at a sampling rate of 500Hz  and reviewed with a high frequency filter of 70Hz  and a low frequency filter of 1Hz . EEG data were recorded continuously and digitally stored.   Description: EEG showed continuous generalized 4.5-5Hz  theta slowing. Hyperventilation and photic stimulation were not performed.     ABNORMALITY -Continuous slow, generalized  IMPRESSION: This study is suggestive of moderate diffuse encephalopathy, nonspecific etiology. No seizures or epileptiform discharges were seen throughout the recording.   Subhan Hoopes Barbra Sarks

## 2020-07-27 NOTE — Progress Notes (Signed)
[facility] Progress Note Patient Name: ACEY WOODFIELD DOB: [birth year] MRN: [id number]   Date of Service  07/27/2020  HPI/Events of Note  Patient admitted with acute hypercapnic respiratory failure and SIRS, with focus of infection unclear, CT abdomen suggests colitis, he received aggressive volume resuscitation per sepsis guidelines , and the concern is that he is currently in pulmonary edema. Altered mental status is under evaluation by neurology and he is empirically covered for meningitis because LP is contraindicated by anticoagulated status.  eICU Interventions  New Patient Evaluation completed.        [place]on[place]wo U Ogan 07/27/2020, 1:40 AM

## 2020-07-27 NOTE — Care Plan (Signed)
Patient markedly improving with excellent ICU team and management of his metabolic/infectious issues He briskly follows commands for me including showing me his thumb, showing me his 2 fingers, moving all 4 extremities grossly equally and briskly antigravity  Discussed with CCM team, given his rapid improvement neurology will sign off at this time but we are available should new questions or concerns arise.

## 2020-07-27 NOTE — Significant Event (Signed)
Spoke with patient daughter. Endorses patient has had diarrhea for over a week, today was actually on the way to the bathroom when he felt dizzy and fell. Patient with continued diarrhea will order C.Diff and GI pathogen panel.

## 2020-07-27 NOTE — Progress Notes (Signed)
PCCM Family Communication   Spoke with patient's daughter. Updates provided, all questions answered.   Eliseo Gum MSN, AGACNP-BC [place] [phone] If no answer, [phone] 07/27/2020, 12:49 PM

## 2020-07-27 NOTE — Progress Notes (Signed)
Critical ABG: pH= 7.140 pCO2= 66.8  Called [facility] and made aware of ABG. Bedside RN also made aware.

## 2020-07-27 NOTE — Progress Notes (Signed)
[facility] Physician-Brief Progress Note Patient Name: Todd Lloyd DOB: 30-Sep-1940 MRN: [id number]   Date of Service  07/27/2020  HPI/Events of Note  Patient with ABG consistent with respiratory acidosis.  eICU Interventions  IPAP increased to 22 by trial and error, with tidal volume now 650 and minute ventilation significantly increased from 12 to 17. Trial of AVAPS was unsuccessful, it resulted in a drop in tidal volume and minute ventilation. ABG in one hour.        [name] 07/27/2020, 5:36 AM

## 2020-07-27 NOTE — Progress Notes (Signed)
Pt transported from ED to 61M 2 without event. Report given to 61M therapist.

## 2020-07-27 NOTE — Progress Notes (Signed)
  Echocardiogram 2D Echocardiogram has been performed.  Todd Lloyd 07/27/2020, 2:06 PM

## 2020-07-27 NOTE — Progress Notes (Signed)
Patients grandson [place], [employer], came by to check on patient. Patient recognized him and had a full conversation with him. Patient worried about who is taking care of his wife and he wants to go home to her. Reassured patient that we are doing everything we can to get him well enough to go home to his "nana".

## 2020-07-27 NOTE — Progress Notes (Signed)
EEG complete - results pending 

## 2020-07-27 NOTE — Progress Notes (Signed)
Upper and lower dentures removed from patient and put in a denture with patient label on bedside table.

## 2020-07-27 NOTE — Progress Notes (Signed)
Report taken from Lilia Pro, RN. Patient brought to room 62m02 on stretcher from the ED. Patient is alert to name and combative when stimulated. Two PIV's in bilateral AC's, unable to assess due to patient keeping his arms bent up to his chest. PIV in right hand noted to be infiltrated with levo running through it at 9 mcg. Infusion switch to right AC and IV removed and pressure held for 5 minutes. Gauze applied. Bilateral mitts placed on patient to keep him from pulling himself off the monitor. Patient is on BiPAP @ 50%. Afebrile. SCD's placed on LLE and BP cuff removed from right arm and placed on right leg due to patient keeping his arm bent. Only belongings the patient has is some shorts that are in a personal belonging bag at bedside. Skin assessment done with Lilian Kapur, RN. Patient has abrasions to b/l knees, back of neck, left shoulder. Bruising to Right butt. MASD noted around anus and perineum. Excoriated mark noted on right butt. Barrier cream and anti-fungal applied to perineum. Patient given warm blankets, bed alarm on and in lowest position. Continuing to monitor patient.

## 2020-07-27 NOTE — Progress Notes (Signed)
ANTICOAGULATION CONSULT NOTE - Initial Consult  Pharmacy Consult for Heparin Indication: atrial fibrillation while apixaban on hold  Allergies  Allergen Reactions  . Lyrica [Pregabalin]     confusion  . Morphine And Related     confusion    Patient Measurements: Height: 6' (182.9 cm) Weight: 97.3 kg (214 lb 8.1 oz) IBW/kg (Calculated) : 77.6 Heparin Dosing Weight: 97.1 kg   Vital Signs: Temp: 97.5 F (36.4 C) (03/08 1103) Temp Source: Oral (03/08 1103) BP: 88/51 (03/08 1215) Pulse Rate: 78 (03/08 1330)  Labs: Recent Labs    07/26/20 1727 07/26/20 1748 07/26/20 1757 07/26/20 1802 07/26/20 2042 07/26/20 2059 07/26/20 2206 07/26/20 2332 07/27/20 0136 07/27/20 0254 07/27/20 0301 07/27/20 0950 07/27/20 1120  HGB 9.8* 11.2*  11.2*   < >  --   --   --    < >  --  9.7* 10.9* 10.9* 10.2*  --   HCT 34.9* 33.0*  33.0*   < >  --   --   --    < >  --  33.8* 32.0* 32.0* 30.0*  --   PLT PLATELET CLUMPS NOTED ON SMEAR, UNABLE TO ESTIMATE  --   --   --   --   --   --   --  116*  --   --   --   --   APTT  --   --   --  35  --   --   --   --   --   --   --   --   --   LABPROT  --   --   --  19.2*  --   --   --   --   --   --   --   --   --   INR  --   --   --  1.7*  --   --   --   --   --   --   --   --   --   CREATININE 3.57* 3.60*  --   --   --   --   --   --  3.89*  --   --   --   --   CKTOTAL 4,945*  --   --   --   --  7,306*  --   --  29,528*  10,573*  --   --   --  12,472*  TROPONINIHS  --   --   --   --  433*  --   --  522*  --   --   --   --   --    < > = values in this interval not displayed.    Estimated Creatinine Clearance: 18.3 mL/min (A) (by C-G formula based on SCr of 3.89 mg/dL (H)).   Medical History: Past Medical History:  Diagnosis Date  . Acid reflux   . Atrial fibrillation ([place])   . Chronic low back pain 07/16/2018  . Colon polyps   . COPD (chronic obstructive pulmonary disease) ([place])   . Dysrhythmia    afib  . Gait abnormality 09/12/2016  . High  cholesterol   . Hypertension   . Leukemia ([place])    s/p bone marrow transplant in 1990  . Memory difficulties 09/12/2016  . Peripheral neuropathy 10/18/2016  . RLS (restless legs syndrome) 10/18/2016    Assessment: 80 yo male presented on 07/27/2020 with AMS after fall. Patient is on  apixaban prior to admission for Afib which was held for possible LP. Now menigitis unlikely and no plans for LP. Pharmacy consulted to dose heparin. Last dose apixaban 3/7 PM. Patient also in rhabdomyolysis with Scr rising. Ct head negative for hemorrhage. Hgb 10.2. Plt 116. No reported bleeding.  Goal of Therapy:  Heparin level 0.3-0.7 units/ml aPTT 66-102 seconds Monitor platelets by anticoagulation protocol: Yes   Plan:  Start heparin 1450 units/hr Check aPTT and heparin level  In 8 hrs  Monitor by aPTT until heparin level and aPTT correlating  Monitor heparin level, aPTT, CBC and s/s of bleeding daily.  Cristela Felt, PharmD Clinical Pharmacist  07/27/2020,2:25 PM

## 2020-07-28 ENCOUNTER — Inpatient Hospital Stay (HOSPITAL_COMMUNITY): Payer: Medicare Other

## 2020-07-28 DIAGNOSIS — J441 Chronic obstructive pulmonary disease with (acute) exacerbation: Secondary | ICD-10-CM | POA: Diagnosis not present

## 2020-07-28 DIAGNOSIS — N179 Acute kidney failure, unspecified: Secondary | ICD-10-CM | POA: Diagnosis not present

## 2020-07-28 DIAGNOSIS — J9602 Acute respiratory failure with hypercapnia: Secondary | ICD-10-CM | POA: Diagnosis not present

## 2020-07-28 LAB — URINE CULTURE: Culture: NO GROWTH

## 2020-07-28 LAB — COMPREHENSIVE METABOLIC PANEL
ALT: 117 U/L — ABNORMAL HIGH (ref 0–44)
AST: 271 U/L — ABNORMAL HIGH (ref 15–41)
Albumin: 3.1 g/dL — ABNORMAL LOW (ref 3.5–5.0)
Alkaline Phosphatase: 38 U/L (ref 38–126)
Anion gap: 15 (ref 5–15)
BUN: 58 mg/dL — ABNORMAL HIGH (ref 8–23)
CO2: 23 mmol/L (ref 22–32)
Calcium: 7 mg/dL — ABNORMAL LOW (ref 8.9–10.3)
Chloride: 105 mmol/L (ref 98–111)
Creatinine, Ser: 5.23 mg/dL — ABNORMAL HIGH (ref 0.61–1.24)
GFR, Estimated: 10 mL/min — ABNORMAL LOW (ref >60)
Glucose, Bld: 99 mg/dL (ref 70–99)
Potassium: 4 mmol/L (ref 3.5–5.1)
Sodium: 143 mmol/L (ref 135–145)
Total Bilirubin: 0.6 mg/dL (ref 0.3–1.2)
Total Protein: 5.4 g/dL — ABNORMAL LOW (ref 6.5–8.1)

## 2020-07-28 LAB — APTT
aPTT: 120 seconds — ABNORMAL HIGH (ref 24–36)
aPTT: 119 seconds — ABNORMAL HIGH (ref 24–36)
aPTT: 87 seconds — ABNORMAL HIGH (ref 24–36)

## 2020-07-28 LAB — GLUCOSE, CAPILLARY
Glucose-Capillary: 70 mg/dL (ref 70–99)
Glucose-Capillary: 86 mg/dL (ref 70–99)
Glucose-Capillary: 105 mg/dL — ABNORMAL HIGH (ref 70–99)
Glucose-Capillary: 73 mg/dL (ref 70–99)
Glucose-Capillary: 70 mg/dL (ref 70–99)
Glucose-Capillary: 70 mg/dL (ref 70–99)

## 2020-07-28 LAB — CK: Total CK: 9746 U/L — ABNORMAL HIGH (ref 49–397)

## 2020-07-28 LAB — CBC
HCT: 30.4 % — ABNORMAL LOW (ref 39.0–52.0)
Hemoglobin: 9.1 g/dL — ABNORMAL LOW (ref 13.0–17.0)
MCH: 27.2 pg (ref 26.0–34.0)
MCHC: 29.9 g/dL — ABNORMAL LOW (ref 30.0–36.0)
MCV: 91 fL (ref 80.0–100.0)
Platelets: 86 10*3/uL — ABNORMAL LOW (ref 150–400)
RBC: 3.34 MIL/uL — ABNORMAL LOW (ref 4.22–5.81)
RDW: 18.9 % — ABNORMAL HIGH (ref 11.5–15.5)
WBC: 16 10*3/uL — ABNORMAL HIGH (ref 4.0–10.5)
nRBC: 0 % (ref 0.0–0.2)

## 2020-07-28 LAB — PROCALCITONIN: Procalcitonin: 61.41 ng/mL

## 2020-07-28 LAB — SODIUM, URINE, RANDOM: Sodium, Ur: 25 mmol/L

## 2020-07-28 LAB — CREATININE, URINE, RANDOM: Creatinine, Urine: 296.35 mg/dL

## 2020-07-28 LAB — HEPARIN LEVEL (UNFRACTIONATED): Heparin Unfractionated: >2.2 IU/mL — ABNORMAL HIGH (ref 0.30–0.70)

## 2020-07-28 IMAGING — US US RENAL
1 series · 14 of 25 positions shown · non-contrast
Comparison: None.

CLINICAL DATA: Acute kidney injury

EXAM:
RENAL / URINARY TRACT ULTRASOUND COMPLETE

[Series 1: us renal · 14 of 37 slices shown]
[im 1/37]
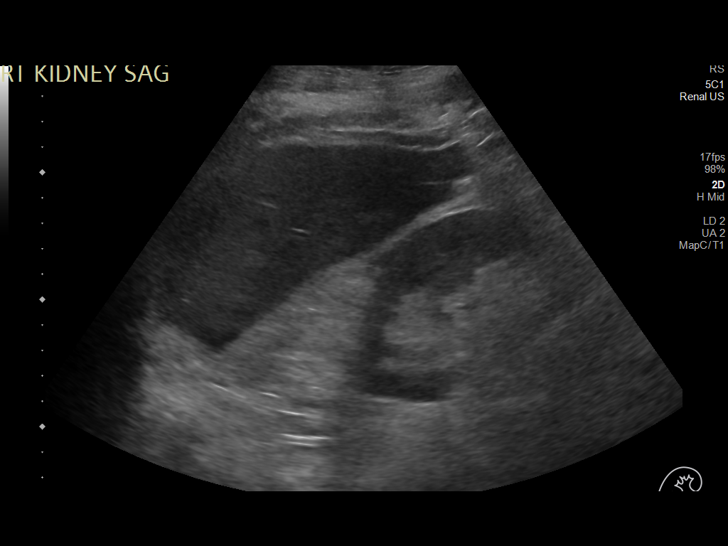
[im 4/37]
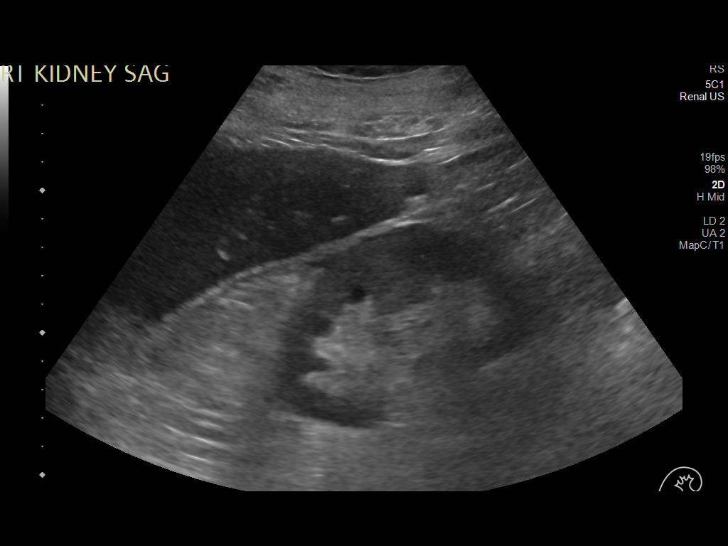
[im 7/37]
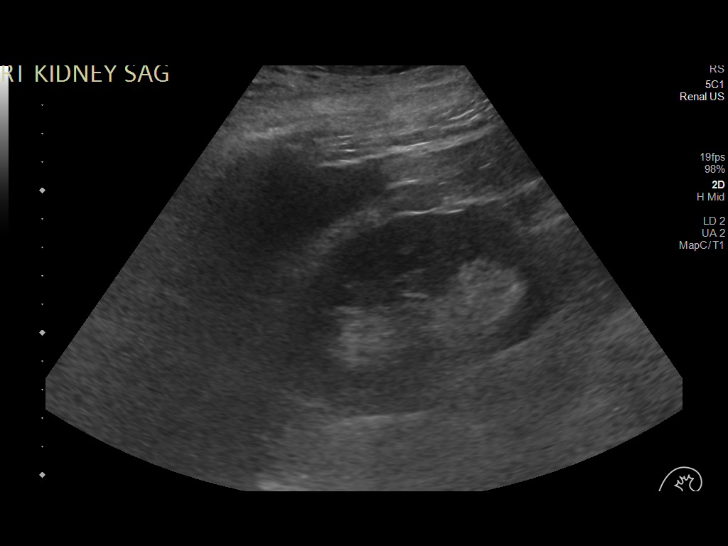
[im 10/37]
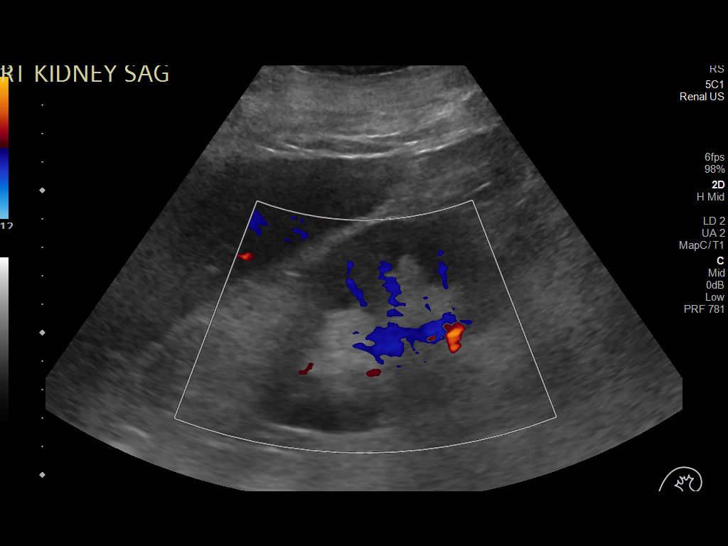
[im 13/37]
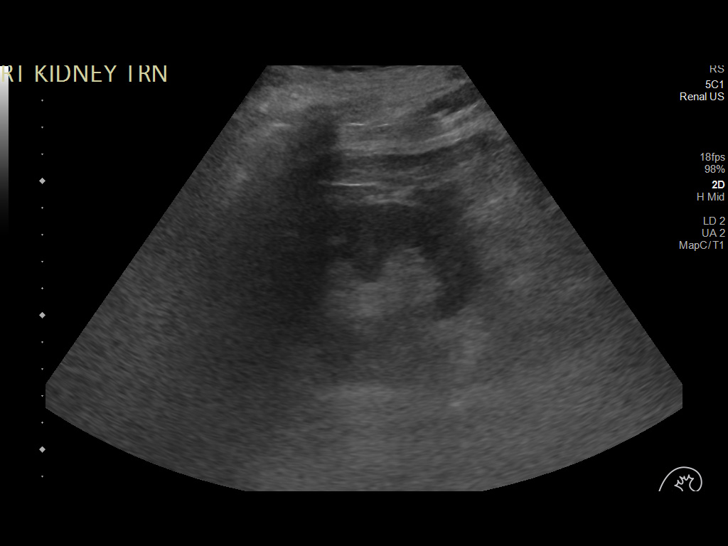
[im 14/37]
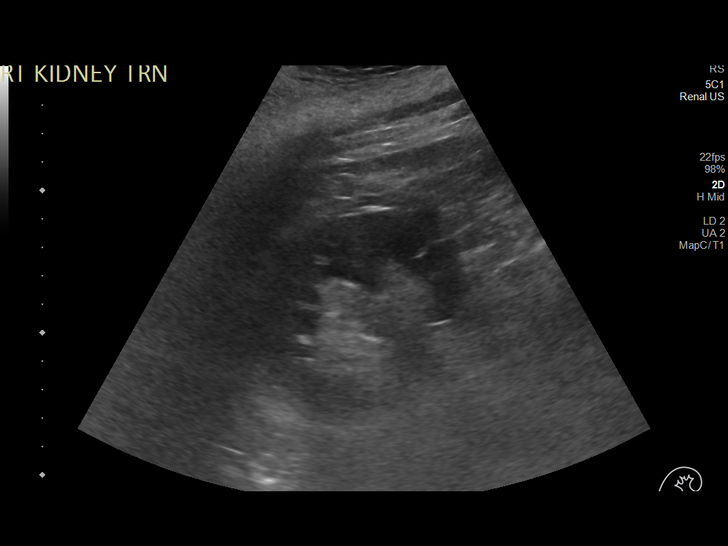
[im 17/37]
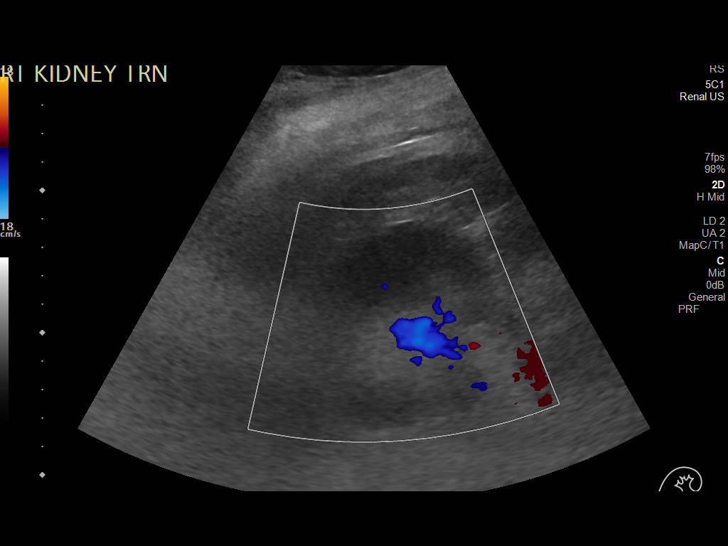
[im 20/37]
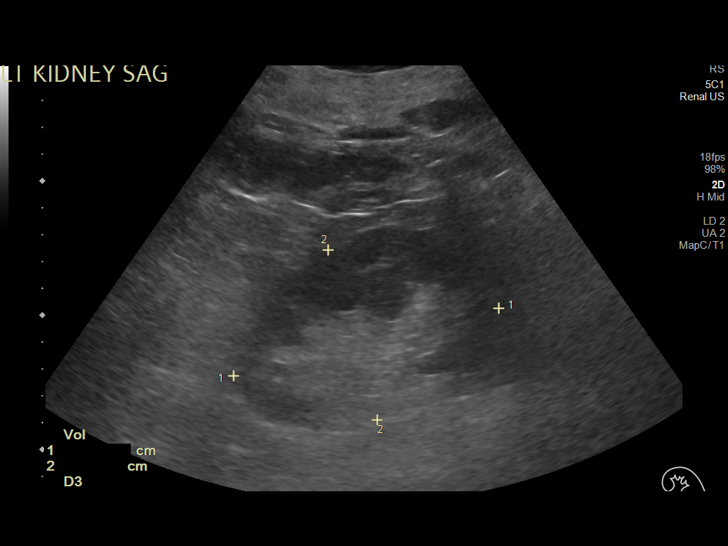
[im 23/37]
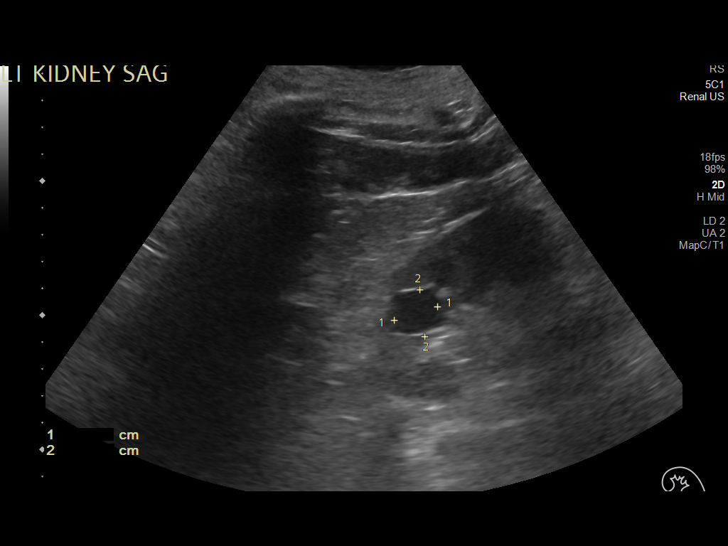
[im 25/37]
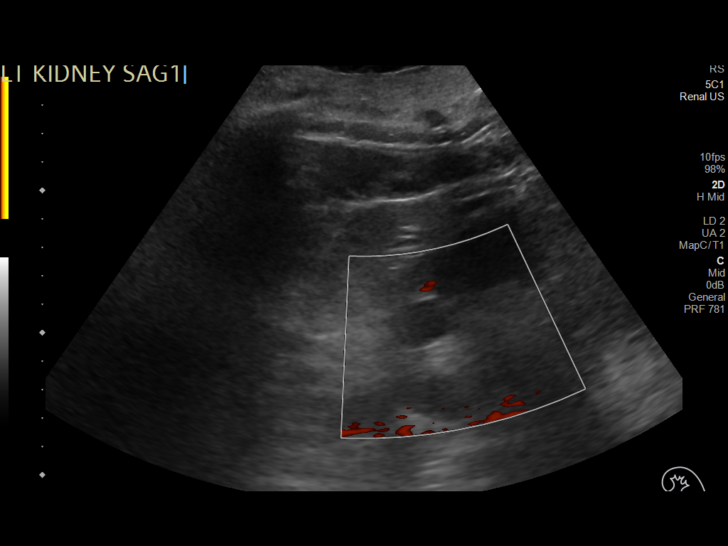
[im 28/37]
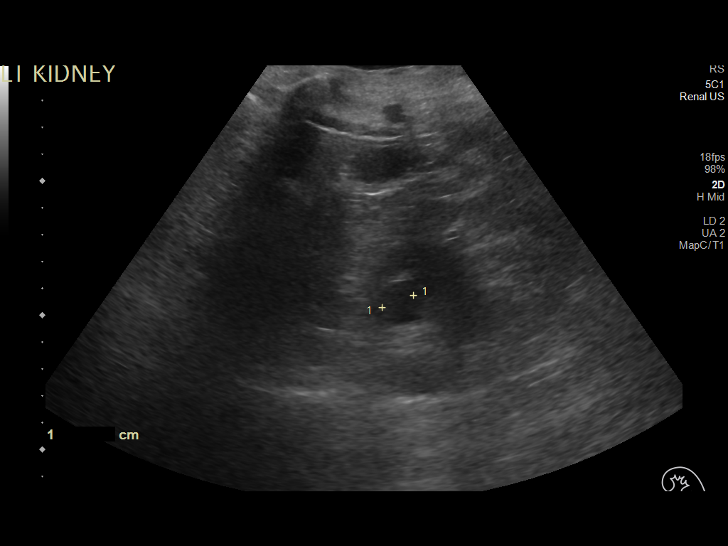
[im 31/37]
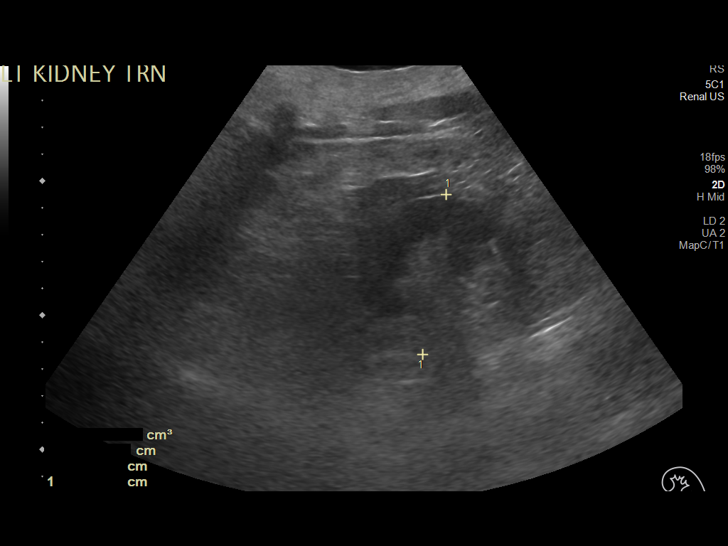
[im 34/37]
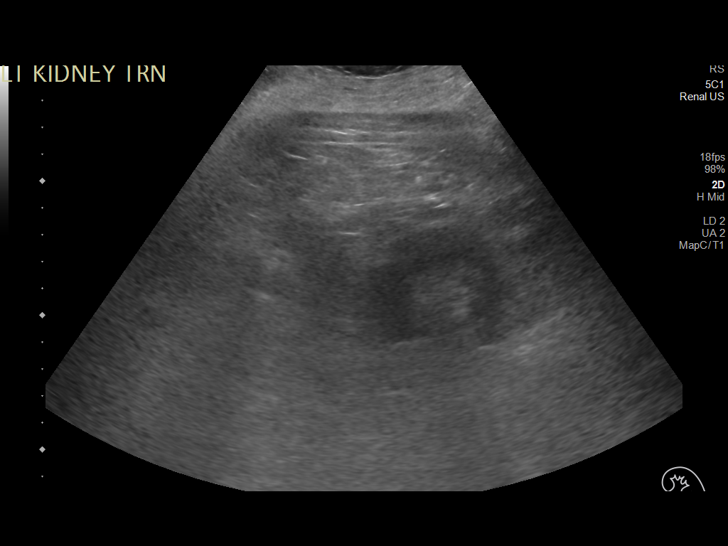
[im 37/37]
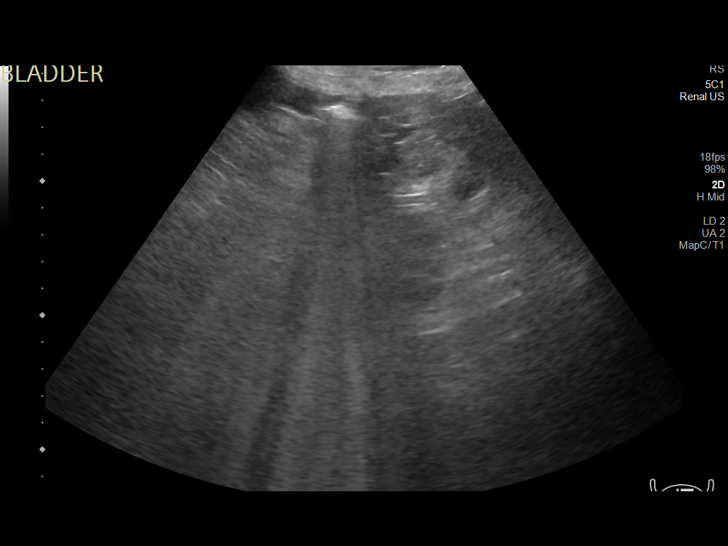

[14 of 25 positions shown; findings below may reference images not displayed]

FINDINGS: Right Kidney:

Renal measurements: 10 x 6.2 x 5.7 cm = volume: 184.4 mL.
Echogenicity within normal limits. No mass or hydronephrosis
visualized.

Left Kidney:

Renal measurements: 10.2 x 6.6 x 6 cm = volume: 211.7 mL.
Echogenicity within normal limits. 1.8 cm cyst at the upper pole. No
hydronephrosis visualized.

Bladder:

Decompressed and not visualized

Other:

None.
IMPRESSION: No hydronephrosis.

## 2020-07-28 MED ORDER — MEMANTINE HCL 5 MG PO TABS
5.0000 mg | ORAL_TABLET | Freq: Two times a day (BID) | ORAL | Status: DC
  Administered 2020-07-28 – 2020-08-20 (×45): 5 mg via ORAL
  Filled 2020-07-28 (×47): qty 1

## 2020-07-28 MED ORDER — LOPERAMIDE HCL 2 MG PO CAPS
2.0000 mg | ORAL_CAPSULE | ORAL | Status: DC | PRN
  Administered 2020-07-28 – 2020-07-29 (×2): 2 mg via ORAL
  Filled 2020-07-28 (×4): qty 1

## 2020-07-28 MED ORDER — LACTATED RINGERS IV BOLUS
500.0000 mL | Freq: Once | INTRAVENOUS | Status: AC
  Administered 2020-07-28: 500 mL via INTRAVENOUS

## 2020-07-28 MED ORDER — GERHARDT'S BUTT CREAM
TOPICAL_CREAM | Freq: Four times a day (QID) | CUTANEOUS | Status: DC | PRN
  Filled 2020-07-28 (×2): qty 1

## 2020-07-28 MED ORDER — AZITHROMYCIN 500 MG PO TABS
1000.0000 mg | ORAL_TABLET | Freq: Once | ORAL | Status: AC
  Administered 2020-07-28: 1000 mg via ORAL
  Filled 2020-07-28: qty 2

## 2020-07-28 MED ORDER — DONEPEZIL HCL 10 MG PO TABS
5.0000 mg | ORAL_TABLET | Freq: Every day | ORAL | Status: DC
  Administered 2020-07-28 – 2020-08-19 (×23): 5 mg via ORAL
  Filled 2020-07-28 (×24): qty 1

## 2020-07-28 NOTE — Progress Notes (Signed)
ANTICOAGULATION CONSULT NOTE - Follow Up Consult  Pharmacy Consult for Heparin Indication: atrial fibrillation while apixaban on hold  Allergies  Allergen Reactions  . Lyrica [Pregabalin]     confusion  . Morphine And Related     confusion    Patient Measurements: Height: 6' (182.9 cm) Weight: 97.3 kg (214 lb 8.1 oz) IBW/kg (Calculated) : 77.6 Heparin Dosing Weight: 97.1 kg   Vital Signs: Temp: 98.5 F (36.9 C) (03/09 0726) Temp Source: Oral (03/09 0726) BP: 133/62 (03/09 1000) Pulse Rate: 96 (03/09 1000)  Labs: Recent Labs    07/26/20 1727 07/26/20 1748 07/26/20 1802 07/26/20 2042 07/26/20 2059 07/26/20 2332 07/27/20 0136 07/27/20 0254 07/27/20 0301 07/27/20 0950 07/27/20 1120 07/27/20 1457 07/27/20 2254 07/28/20 0103 07/28/20 0909  HGB 9.8*   < >  --   --    < >  --  9.7*   < > 10.9* 10.2*  --   --   --  9.1*  --   HCT 34.9*   < >  --   --    < >  --  33.8*   < > 32.0* 30.0*  --   --   --  30.4*  --   PLT PLATELET CLUMPS NOTED ON SMEAR, UNABLE TO ESTIMATE  --   --   --   --   --  116*  --   --   --   --   --   --  86*  --   APTT  --   --  35  --   --   --   --   --   --   --   --   --  120*  --  119*  LABPROT  --   --  19.2*  --   --   --   --   --   --   --   --   --   --   --   --   INR  --   --  1.7*  --   --   --   --   --   --   --   --   --   --   --   --   HEPARINUNFRC  --   --   --   --   --   --   --   --   --   --   --   --  >2.20*  --   --   CREATININE 3.57*   < >  --   --   --   --  3.89*  --   --   --   --  4.71*  --  5.23*  --   CKTOTAL 4,945*  --   --   --    < >  --  11,849*  10,573*  --   --   --  12,472*  --   --  9,746*  --   TROPONINIHS  --   --   --  433*  --  522*  --   --   --   --   --   --   --   --   --    < > = values in this interval not displayed.    Estimated Creatinine Clearance: 13.6 mL/min (A) (by C-G formula based on SCr of 5.23 mg/dL (H)).   Medical History: Past Medical History:  Diagnosis Date  .  Acid reflux   .  Atrial fibrillation ([place])   . Chronic low back pain 07/16/2018  . Colon polyps   . COPD (chronic obstructive pulmonary disease) ([place])   . Dysrhythmia    afib  . Gait abnormality 09/12/2016  . High cholesterol   . Hypertension   . Leukemia ([place])    s/p bone marrow transplant in 1990  . Memory difficulties 09/12/2016  . Peripheral neuropathy 10/18/2016  . RLS (restless legs syndrome) 10/18/2016    Assessment: 80 yo male presented on 07/27/2020 with AMS after fall. Patient is on apixaban prior to admission for Afib which was held for possible LP. Now menigitis unlikely and no plans for LP. Pharmacy consulted to dose heparin. Last dose apixaban 3/7 PM. aPTT 35 on admission. Patient also in rhabdomyolysis with Scr rising. Ct head negative for hemorrhage.   Heparin level still falsely elevated from previous apixaban. APTT 119 is supratherapeutic after rate reduction to heparin 1300 units/hr. Level drawn appropriately. No reported bleeding or issues with IV infusion. Hgb 9.1. Plt 86 - wnl in December and May 2020.   Goal of Therapy:  Heparin level 0.3-0.7 units/ml aPTT 66-102 seconds Monitor platelets by anticoagulation protocol: Yes   Plan:  Decrease heparin to 1100 units/hr Check aPTT in 8 hrs  Monitor by aPTT until heparin level and aPTT correlating  Monitor heparin level, aPTT, CBC and s/s of bleeding daily.  Cristela Felt, PharmD Clinical Pharmacist  07/28/2020,10:42 AM

## 2020-07-28 NOTE — Progress Notes (Addendum)
NAME:  Todd Lloyd, MRN:  [id number], DOB:  Nov 12, 1940, LOS: 2 ADMISSION DATE:  07/26/2020, CONSULTATION DATE:  07/26/2020 REFERRING MD:  Dr. Eulis Foster , CHIEF COMPLAINT:  Encephalopathy, Hypotension    Brief History  80 yo PMH COPD, NSCLC s/p SBRT, admitted 07/26/20 with AMS after fall.  Has had diarrhea-- got dizzy when he got up to use restroom, slipped, and remained down.  AKI, rhabdo, sepsis, shock (septic + hypovolemia), hypercarbic and hypoxic resp failure on BiPAP.  Past Medical History:  COPD (followed by Dr. 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   Significant Hospital Events:  3/7 > Presents to ED 3/8> Neuro consulted for AMS. improving mentation with metabolic correction, continues on BiPAP. Incr Bicarb gtt   Consults:  PCCM Neuro  Procedures:    Significant Diagnostic Tests:  CT Head 3/7 > 1. No evidence of acute intracranial abnormality. 2. Left frontal scalp contusion without acute fracture. CT C-Spine 3/7 > 1. No evidence of acute fracture. 2. Rotation of C1 on C2 is likely positional in the absence of a fixed torticollis. 3. Bulky facet hypertrophy at multiple levels, greatest on the left at C4-C5 where there is likely at least moderate bony foraminal stenosis. 4. Left eccentric degenerative disc disease in the lower cervical spine, greatest at C7-T1. CT a/p 3/7> focal colitis without evidence of perf. Non obstructing L renal stone. Calcified L adrenal hematoma. Stable RLL mass lesion, associated R effusion CXR 3/7 > Cardiac shadow is mildly enlarged but stable. Aortic calcifications are seen. Known right lower lobe mass lesion is not well appreciated on this exam. No infiltrate or sizable effusion is seen. No acute bony abnormality is noted. EEG 3/8>>study is suggestive of moderate  diffuse encephalopathy, nonspecific etiology. No seizures or epileptiform discharges were seen throughout the recording TTE limited 3/8 >> 1. Left ventricular ejection fraction, by estimation, is 55 to 60%. The left ventricle has normal function. Left ventricular diastolic function could not be evaluated.  2. The mitral valve is grossly normal. No evidence of mitral stenosis.  3. Tricuspid valve regurgitation is mild to moderate.  4. The aortic valve is grossly normal. Aortic valve regurgitation is trivial. No aortic stenosis is present.  5. There is moderately elevated pulmonary artery systolic pressure.   Micro Data:  3/7 BCx  >> 3/7 UCx >> 3/7 COVID/ flu >> Negative  3/7 CDiff >> neg 3/8 GI PCR >> Rotavirus A/ enteropathogenic E. Coli  3/8 MRSA >> neg  Antimicrobials:  3/7 Acyclovir > 3/8 3/7 Ampicillin > 3/8 3/7 Rocephin meningeal coverage (reduced dose 3/8) > 3/9 3/7 Vancomycin > 3/8 3/7 Flagyl > 3/8 3/9 azithro, one time dose oral for enteropathogenic E. Coli   Interim History / Subjective:  Still having multiple watery bilious (non bloody) stools overnight Off BiPAP at 0600 and very demanding and difficult with staff this morning Remains on NE 1.5 mcg/min, unable to wean Remains on Bicarb, heparin, and LR gtt   +5L, net +8.4L UOP 230 ml/ 24hr, ~0.1 ml/kg/hr tmax 100.1 GI panel + for rota virus and enteropathogenic E. Coli   Objective   Blood pressure 116/90, pulse 93, temperature 99.6 F (37.6 C), temperature source Axillary, resp. rate (!) 27, height 6' (1.829 m), weight 97.3 kg, SpO2 95 %.    FiO2 (%):  [45 %]  45 %   Intake/Output Summary (Last 24 hours) at 07/28/2020 0714 Last data filed at 07/28/2020 0500 Gross per 24 hour  Intake 5292.86 ml  Output 230 ml  Net 5062.86 ml   Filed Weights   07/26/20 1846 07/27/20 0049 07/27/20 0155  Weight: 96.2 kg 97.3 kg 97.3 kg    Examination: General:  Elderly male sitting upright in no distress, demanding ice and  to speak with his daughter, but redirectable  HEENT: MM pink/dry, pupils 3/reactive Neuro: Awake, oriented to self, place, month, wrong year, MAE, f/c CV: IR IR, afib- rate controlled, no murmur PULM:  Non labored, clear and diminished, on 3-5 L [place], no wheeze GI: distended, mildly tender- generalized, hyperBS, foley Extremities: warm/dry, no LE edema   Resolved Hospital Problem list     Assessment & Plan:   Acute respiratory failure with hypoxia and hypercarbia - improving  COPD Stg 1 R Lung Ca s/p radiation -follows with Dr. Lamonte Sakai  P - BiPAP prn  - wean supplemental O2 for sat goal > 92% -duoneb, pulmicort, albuterol   Acute metabolic encephalopathy - improving  Hx dementia  -septic shock, AKI, hypercarbia -CT Head with left frontal scalp contusion  Plan -Neuro was consulted, signed off 3/8 given improving status -EEG 3/8 neg - with improving mentation, will defer MRI at this time - remains non-focal, continue neuro exams - home namenda and aricept restarted   Shock-- suspect septic, +/- component of hypovolemia in setting of Rotavirus and enteropathogenic E. Coli -Previous ECHO 02/2020 with EF 60-65, LV normal function  -with AMS, was started on empiric meningitis coverage. Stopped 3/8  - pt denies any sick contacts 3/9 Plan - continue LR 157ml/hr, has ongoing diarrhea  - add LR bolus now, made need additional, s/p albumin x 3 yesterday  - continue to wean NE for MAP goal > 65, remains on 1.5 mcg/min - TTE as above, EF 55-60%, limited study, DD indeterminate,  - stop abx, will treat with azithro x 1 orally for enteropathogenic E. Coli - follow other culture data, trend WBC/ fever curve    Rotavirus and enteropathogenic E. Coli - no contact precautions required per infectious control - azithro orally x 1 today for enteropathogenic E. Coli - ongoing supportive care and fluid resuscitation  - loperamide 2mg  now then prn   AKI Rhabdomyolysis, rising CK NGMA -fell  on way to bathroom, remained on ground for extended period  Plan  - CK down trending 12400-> 9700 - sCr trending up 4.71-> 5.23, UOP remains oliguric  - ongoing diarrhea, looks dry, continue fluid resuscitation  - continue foley for now - continue bicarb gtt for now - assess renal ultrasound and urine lytes - consult nephrology - no emergent needs for dialysis at this time - trending renal indices - Avoid nephrotoxic agents, ensure adequate renal perfusion   Hyperkalemia, mild, resolved Hyperphosphatemia Hypocalcemia P - trend BMET - Replace electrolytes as indicated  Afib Plan - Hold Digoxin still renal function better - continue heparin per pharmacy, holding Eliquis for now - remains rate controlled - electrolyte optimization   Thrombocytopenia - likely due to sepsis, monitor while on heparin - no signs of bleeding, H/H stable    Best practice (evaluated daily)  Diet: start clear liquid  DVT prophylaxis: SCD GI prophylaxis: PPI Glucose control: CBG  Mobility: Bedrest Disposition:Admit to ICU   Goals of Care:  Last date of multidisciplinary goals of care discussion: CCM NP spoke with family overnight on admission -- daughter is  retired Tree surgeon and would like to be included in discussions RE dialysis or intubation should these interventions become recommended.  Family and staff present:  Summary of discussion:  Follow up goals of care discussion due:  Code Status: Full Code   Pending family update 3/9.  Labs   CBC: Recent Labs  Lab 07/26/20 1727 07/26/20 1748 07/27/20 0136 07/27/20 0254 07/27/20 0301 07/27/20 0950 07/28/20 0103  WBC 21.2*  --  25.8*  --   --   --  16.0*  HGB 9.8*   < > 9.7* 10.9* 10.9* 10.2* 9.1*  HCT 34.9*   < > 33.8* 32.0* 32.0* 30.0* 30.4*  MCV 95.4  --  94.9  --   --   --  91.0  PLT PLATELET CLUMPS NOTED ON SMEAR, UNABLE TO ESTIMATE  --  116*  --   --   --  86*   < > = values in this interval not displayed.    Basic Metabolic  Panel: Recent Labs  Lab 07/26/20 1727 07/26/20 1748 07/26/20 1757 07/27/20 0136 07/27/20 0254 07/27/20 0301 07/27/20 0950 07/27/20 1457 07/28/20 0103  NA 138 142  141   < > 139 140 141 143 141 143  K 4.6 4.7  4.7   < > 5.4* 5.1 5.3* 4.6 4.6 4.0  CL 106 108  --  107  --   --   --  104 105  CO2 22  --   --  19*  --   --   --  25 23  GLUCOSE 84 81  --  105*  --   --   --  114* 99  BUN 46* 49*  --  52*  --   --   --  59* 58*  CREATININE 3.57* 3.60*  --  3.89*  --   --   --  4.71* 5.23*  CALCIUM 7.5*  --   --  7.2*  --   --   --  7.6* 7.0*  MG  --   --   --  1.9  --   --   --   --   --   PHOS  --   --   --  7.5*  --   --   --   --   --    < > = values in this interval not displayed.   GFR: Estimated Creatinine Clearance: 13.6 mL/min (A) (by C-G formula based on SCr of 5.23 mg/dL (H)). Recent Labs  Lab 07/26/20 1727 07/26/20 2042 07/26/20 2206 07/27/20 0136 07/28/20 0103  PROCALCITON  --  26.97  --  39.52 61.41  WBC 21.2*  --   --  25.8* 16.0*  LATICACIDVEN 1.3  --  1.3  --   --     Liver Function Tests: Recent Labs  Lab 07/26/20 1727 07/27/20 0136 07/28/20 0103  AST 131* 231* 271*  ALT 67* 86* 117*  ALKPHOS 46 42 38  BILITOT 1.0 0.8 0.6  PROT 5.6* 5.7* 5.4*  ALBUMIN 3.1* 3.4* 3.1*   No results for input(s): LIPASE, AMYLASE in the last 168 hours. No results for input(s): AMMONIA in the last 168 hours.  ABG    Component Value Date/Time   PHART 7.273 (L) 07/27/2020 0950   PCO2ART 51.9 (H) 07/27/2020 0950   PO2ART 134 (H) 07/27/2020 0950   HCO3 24.1 07/27/2020 0950   TCO2 26 07/27/2020 0950   ACIDBASEDEF 3.0 (H) 07/27/2020 0950   O2SAT 99.0 07/27/2020 0950  Coagulation Profile: Recent Labs  Lab 07/26/20 1802  INR 1.7*    Cardiac Enzymes: Recent Labs  Lab 07/26/20 1727 07/26/20 2059 07/27/20 0136 07/27/20 1120  CKTOTAL 4,945* 7,306* 11,849*  10,573* 12,472*    HbA1C: No results found for: HGBA1C  CBG: Recent Labs  Lab 07/27/20 1553  07/27/20 1603 07/27/20 1904 07/27/20 2302 07/28/20 0307  GLUCAP 41* 76 74 76 70     CRITICAL CARE Performed by: Kennieth Rad   Total critical care time: 45 minutes  Critical care time was exclusive of separately billable procedures and treating other patients.  Critical care was necessary to treat or prevent imminent or life-threatening deterioration.  Critical care was time spent personally by me on the following activities: development of treatment plan with patient and/or surrogate as well as nursing, discussions with consultants, evaluation of patient's response to treatment, examination of patient, obtaining history from patient or surrogate, ordering and performing treatments and interventions, ordering and review of laboratory studies, ordering and review of radiographic studies, pulse oximetry and re-evaluation of patient's condition.   Kennieth Rad, ACNP [facility] 07/28/2020, 7:15 AM  See Amion for pager If no response to pager, please call [phone] until 7pm After 7:00 pm call Elink  [phone]?[place]

## 2020-07-28 NOTE — Progress Notes (Signed)
ANTICOAGULATION CONSULT NOTE - Follow Up Consult  Pharmacy Consult for heparin Indication: atrial fibrillation  Labs: Recent Labs    07/26/20 1727 07/26/20 1748 07/26/20 1757 07/26/20 1802 07/26/20 2042 07/26/20 2059 07/26/20 2206 07/26/20 2332 07/27/20 0136 07/27/20 0254 07/27/20 0301 07/27/20 0950 07/27/20 1120 07/27/20 1457 07/27/20 2254  HGB 9.8* 11.2*  11.2*   < >  --   --   --    < >  --  9.7* 10.9* 10.9* 10.2*  --   --   --   HCT 34.9* 33.0*  33.0*   < >  --   --   --    < >  --  33.8* 32.0* 32.0* 30.0*  --   --   --   PLT PLATELET CLUMPS NOTED ON SMEAR, UNABLE TO ESTIMATE  --   --   --   --   --   --   --  116*  --   --   --   --   --   --   APTT  --   --   --  35  --   --   --   --   --   --   --   --   --   --  120*  LABPROT  --   --   --  19.2*  --   --   --   --   --   --   --   --   --   --   --   INR  --   --   --  1.7*  --   --   --   --   --   --   --   --   --   --   --   HEPARINUNFRC  --   --   --   --   --   --   --   --   --   --   --   --   --   --  >2.20*  CREATININE 3.57* 3.60*  --   --   --   --   --   --  3.89*  --   --   --   --  4.71*  --   CKTOTAL 4,945*  --   --   --   --  7,306*  --   --  11,849*  10,573*  --   --   --  12,472*  --   --   TROPONINIHS  --   --   --   --  433*  --   --  522*  --   --   --   --   --   --   --    < > = values in this interval not displayed.    Assessment: 80yo male supratherapeutic on heparin with initial dosing while Eliquis on hold; no gtt issues or signs of bleeding per RN.  Goal of Therapy:  aPTT 66-102 seconds   Plan:  Will decrease heparin gtt by 1-2 units/kg/hr to 1300 units/hr and check PTT in 8 hours.    Wynona Neat, PharmD, BCPS  07/28/2020,12:13 AM

## 2020-07-28 NOTE — Progress Notes (Signed)
[facility] for Heparin Indication: atrial fibrillation while apixaban on hold  Allergies  Allergen Reactions  . Lyrica [Pregabalin]     confusion  . Morphine And Related     confusion    Patient Measurements: Height: 6' (182.9 cm) Weight: 97.3 kg (214 lb 8.1 oz) IBW/kg (Calculated) : 77.6 Heparin Dosing Weight: 97.1 kg   Vital Signs: Temp: 99.1 F (37.3 C) (03/09 1951) Temp Source: Oral (03/09 1951) BP: 111/57 (03/09 1730) Pulse Rate: 85 (03/09 1730)  Labs: Recent Labs    07/26/20 1727 07/26/20 1748 07/26/20 1802 07/26/20 2042 07/26/20 2059 07/26/20 2332 07/27/20 0136 07/27/20 0254 07/27/20 0301 07/27/20 0950 07/27/20 1120 07/27/20 1457 07/27/20 2254 07/28/20 0103 07/28/20 0909 07/28/20 1931  HGB 9.8*   < >  --   --    < >  --  9.7*   < > 10.9* 10.2*  --   --   --  9.1*  --   --   HCT 34.9*   < >  --   --    < >  --  33.8*   < > 32.0* 30.0*  --   --   --  30.4*  --   --   PLT PLATELET CLUMPS NOTED ON SMEAR, UNABLE TO ESTIMATE  --   --   --   --   --  116*  --   --   --   --   --   --  86*  --   --   APTT  --    < > 35  --   --   --   --   --   --   --   --   --  120*  --  119* 87*  LABPROT  --   --  19.2*  --   --   --   --   --   --   --   --   --   --   --   --   --   INR  --   --  1.7*  --   --   --   --   --   --   --   --   --   --   --   --   --   HEPARINUNFRC  --   --   --   --   --   --   --   --   --   --   --   --  >2.20*  --   --   --   CREATININE 3.57*   < >  --   --   --   --  3.89*  --   --   --   --  4.71*  --  5.23*  --   --   CKTOTAL 4,945*  --   --   --    < >  --  11,849*  10,573*  --   --   --  12,472*  --   --  9,746*  --   --   TROPONINIHS  --   --   --  433*  --  522*  --   --   --   --   --   --   --   --   --   --    < > = values in this interval not displayed.  Estimated Creatinine Clearance: 13.6 mL/min (A) (by C-G formula based on SCr of 5.23 mg/dL (H)).   Assessment: 80 yo male presented  on 07/27/2020 with AMS after fall. Patient is on apixaban prior to admission for Afib which was held for possible LP. Now menigitis unlikely and no plans for LP. Pharmacy consulted to dose heparin. Last dose apixaban 3/7 PM. aPTT 35 on admission. Patient also in rhabdomyolysis with Scr rising. Ct head negative for hemorrhage.   APTT therapeutic at 87 sec after rate reduction from earlier today; no bleeding reported.  Goal of Therapy:  Heparin level 0.3-0.7 units/ml aPTT 66-102 seconds Monitor platelets by anticoagulation protocol: Yes   Plan:  Continue heparin gtt at 1100 units/hr F/U AM labs  Silas Muff D. [name] Marble, PharmD, BCPS, [place] 07/28/2020, 8:47 PM

## 2020-07-28 NOTE — Consult Note (Signed)
Nephrology Consult   Requesting provider: [name] requesting consult: CCM Reason for consult: AKI   Assessment/Recommendations: Todd Lloyd is a/an 80 y.o. male with a past medical history COPD, lung cancer status post treatment, dementia, atrial fibrillation who present w/ sepsis now with AKI  Oliguric AKI, unstable worsening: Likely secondary to ATN from shock as well as rhabdomyolysis.  Significant AKI before he even arrived to the hospital.  Some component to dehydration. -Continue management of shock as below -Continue to monitor daily Cr, Dose meds for GFR -Monitor Daily I/Os, Daily weight  -Maintain MAP>65 for optimal renal perfusion.  -Avoid nephrotoxic medications including NSAIDs and Vanc/Zosyn combo -Check Renal U/S to rule out obstruction -Currently no indication for HD but will have to monitor closely; family would be open to time trial of dialysis if needed -Agree with holding losartan and diuretics -Can continue lactated Ringer's but would stop for worsening hypoxia  Acute hypoxic and hypercarbic respiratory failure: History of COPD.  Management per primary.  Supplemental oxygen as needed  Shock: Likely rotavirus and possibly E. coli.  Can continue fluids and norepinephrine per primary team.  Has also received albumin.  Antimicrobial agents per primary  Altered mental status: Improving likely related to septic shock.  Continue to monitor  Hypertension: Blood pressure low at this time related to shock.  Holding home medications  Metabolic acidosis: Previously present but now improved.  Will stop sodium bicarb infusion but continue lactated Ringer's as above   Recommendations conveyed to primary service.    [facility] 07/28/2020 10:35 AM   _____________________________________________________________________________________ CC: AKI  History of Present Illness: Todd Lloyd is a/an 80 y.o. male with a past  medical history of COPD, lung cancer status post treatment, dementia, atrial fibrillation who presented on 3/7 after a fall at home.  The patient is confused and provides minimal history.  The patient fell in the morning of 3/7 was found later that day by family.  The patient appeared ill when EMS arrived and he was hypotensive.  He was also confused and lethargic.  In the emergency department he was found to be febrile with leukocytosis and elevated CK.  He was given a large amount of IV fluids and admitted for further evaluation..  During his hospitalization he has had persistent hypotension requiring norepinephrine.  He has been treated with broad-spectrum antibiotics including vancomycin for infection.  His norepinephrine requirements have diminished throughout his hospital stay but he remains on these medications.  He did have a CK that increased up to 11,850 before improving.  Patient had a normal creatinine of 1.1 on 07/06/2020.  His creatinine on arrival was 3.6 and today it is 5.2.  Urinalysis from 3/7 demonstrates minimal hematuria and proteinuria.  Kidneys appeared relatively normal on 07/26/2020 on CT scan.  Of note, the patient is on losartan and Lasix at home.  Medications:  Current Facility-Administered Medications  Medication Dose Route Frequency Provider Last Rate Last Admin  . 0.9 %  sodium chloride infusion  250 mL Intravenous Continuous Omar Person, NP 10 mL/hr at 07/28/20 0900 Infusion Verify at 07/28/20 0900  . albuterol (PROVENTIL) (2.5 MG/3ML) 0.083% nebulizer solution 2.5 mg  2.5 mg Nebulization Q4H PRN Omar Person, NP      . budesonide (PULMICORT) nebulizer solution 0.5 mg  0.5 mg Nebulization BID Renee Pain, MD   0.5 mg at 07/28/20 0753  . chlorhexidine (PERIDEX) 0.12 % solution 15 mL  15 mL Mouth  Rinse BID Renee Pain, MD   15 mL at 07/27/20 2205  . Chlorhexidine Gluconate Cloth 2 % PADS 6 each  6 each Topical Q0600 Renee Pain, MD   6 each at  07/27/20 1000  . donepezil (ARICEPT) tablet 5 mg  5 mg Oral QHS Arnell Asal, NP      . Gerhardt's butt cream   Topical QID PRN Jennelle Human B, NP      . heparin ADULT infusion 100 units/mL (25000 units/245mL)  1,300 Units/hr Intravenous Continuous Laren Everts, RPH 13 mL/hr at 07/28/20 0900 1,300 Units/hr at 07/28/20 0900  . ipratropium-albuterol (DUONEB) 0.5-2.5 (3) MG/3ML nebulizer solution 3 mL  3 mL Nebulization Q6H Renee Pain, MD   3 mL at 07/28/20 0753  . lactated ringers infusion   Intravenous Continuous Cristal Generous, NP 100 mL/hr at 07/28/20 0800 Infusion Verify at 07/28/20 0800  . loperamide (IMODIUM) capsule 2 mg  2 mg Oral PRN Jennelle Human B, NP      . MEDLINE mouth rinse  15 mL Mouth Rinse q12n4p Renee Pain, MD   15 mL at 07/27/20 1628  . memantine (NAMENDA) tablet 5 mg  5 mg Oral BID Jennelle Human B, NP      . norepinephrine (LEVOPHED) 4mg  in 24mL premix infusion  2-10 mcg/min Intravenous Titrated Omar Person, NP 5.63 mL/hr at 07/28/20 0900 1.5 mcg/min at 07/28/20 0900  . pantoprazole (PROTONIX) injection 40 mg  40 mg Intravenous Q24H Omar Person, NP   40 mg at 07/28/20 0829  . polyethylene glycol (MIRALAX / GLYCOLAX) packet 17 g  17 g Oral Daily PRN Hayden Pedro M, NP      . sodium bicarbonate 150 mEq in dextrose 5 % 1,000 mL infusion   Intravenous Continuous Bowser, Laurel Dimmer, NP 125 mL/hr at 07/28/20 0900 Infusion Verify at 07/28/20 0900     ALLERGIES Lyrica [pregabalin] and Morphine and related  MEDICAL HISTORY Past Medical History:  Diagnosis Date  . Acid reflux   . Atrial fibrillation ([place])   . Chronic low back pain 07/16/2018  . Colon polyps   . COPD (chronic obstructive pulmonary disease) ([place])   . Dysrhythmia    afib  . Gait abnormality 09/12/2016  . High cholesterol   . Hypertension   . Leukemia ([place])    s/p bone marrow transplant in 1990  . Memory difficulties 09/12/2016  . Peripheral neuropathy 10/18/2016  . RLS  (restless legs syndrome) 10/18/2016     SOCIAL HISTORY Social History   Socioeconomic History  . Marital status: Married    Spouse name: Hoyle Sauer  . Number of children: 0  . Years of education: 82  . Highest education level: Not on file  Occupational History  . Occupation: Retired    Comment: Chartered loss adjuster  Tobacco Use  . Smoking status: Former Smoker    Packs/day: 2.00    Years: 43.00    Pack years: 86.00    Types: Cigarettes    Quit date: 05/22/2000    Years since quitting: 20.1  . Smokeless tobacco: Never Used  . Tobacco comment: Counseled to remain smoke free  Substance and Sexual Activity  . Alcohol use: No    Alcohol/week: 0.0 standard drinks  . Drug use: No  . Sexual activity: Not on file  Other Topics Concern  . Not on file  Social History Narrative   Lives with spouse   Caffeine use:  Coffee (2-3 cups every morning)   Right-handed  Social Determinants of Health   Financial Resource Strain: Not on file  Food Insecurity: Not on file  Transportation Needs: Not on file  Physical Activity: Not on file  Stress: Not on file  Social Connections: Not on file  Intimate Partner Violence: Not on file     FAMILY HISTORY Family History  Problem Relation Age of Onset  . Colon cancer Father   . Heart disease Father   . Leukemia Maternal Uncle   . Melanoma Mother   . Dementia Mother   . ALS Sister   . Lung cancer Brother       Review of Systems: Unable to obtain due to the patient's altered mental status  Physical Exam: Vitals:   07/28/20 0945 07/28/20 1000  BP: 131/79 133/62  Pulse: 87 96  Resp: 17 15  Temp:    SpO2: 98% 97%   Total I/O In: 413.7 [I.V.:413.7] Out: -   Intake/Output Summary (Last 24 hours) at 07/28/2020 1035 Last data filed at 07/28/2020 0900 Gross per 24 hour  Intake 5932.19 ml  Output 210 ml  Net 5722.19 ml   General: Ill-appearing, lying in bed, no distress HEENT: anicteric sclera, oropharynx clear without lesions CV:  Normal rate, no audible murmur, trace edema in the bilateral lower extremities Lungs: Coarse bilateral breath sounds, mild increased work of breathing Abd: soft, non-tender, non-distended Skin: no visible lesions or rashes Psych: alert, not engaged, mood and affect seem appropriate Musculoskeletal: no obvious deformities Neuro: normal speech, confused but no obvious specific deficits  Test Results Reviewed Lab Results  Component Value Date   NA 143 07/28/2020   K 4.0 07/28/2020   CL 105 07/28/2020   CO2 23 07/28/2020   BUN 58 (H) 07/28/2020   CREATININE 5.23 (H) 07/28/2020   CALCIUM 7.0 (L) 07/28/2020   ALBUMIN 3.1 (L) 07/28/2020   PHOS 7.5 (H) 07/27/2020     I have reviewed all relevant outside healthcare records related to the patient's current hospitalization

## 2020-07-29 DIAGNOSIS — J9602 Acute respiratory failure with hypercapnia: Secondary | ICD-10-CM | POA: Diagnosis not present

## 2020-07-29 DIAGNOSIS — J441 Chronic obstructive pulmonary disease with (acute) exacerbation: Secondary | ICD-10-CM | POA: Diagnosis not present

## 2020-07-29 DIAGNOSIS — N179 Acute kidney failure, unspecified: Secondary | ICD-10-CM | POA: Diagnosis not present

## 2020-07-29 LAB — RENAL FUNCTION PANEL
Albumin: 2.8 g/dL — ABNORMAL LOW (ref 3.5–5.0)
Anion gap: 18 — ABNORMAL HIGH (ref 5–15)
BUN: 61 mg/dL — ABNORMAL HIGH (ref 8–23)
CO2: 25 mmol/L (ref 22–32)
Calcium: 7 mg/dL — ABNORMAL LOW (ref 8.9–10.3)
Chloride: 100 mmol/L (ref 98–111)
Creatinine, Ser: 6.45 mg/dL — ABNORMAL HIGH (ref 0.61–1.24)
GFR, Estimated: 8 mL/min — ABNORMAL LOW (ref >60)
Glucose, Bld: 69 mg/dL — ABNORMAL LOW (ref 70–99)
Phosphorus: 5.3 mg/dL — ABNORMAL HIGH (ref 2.5–4.6)
Potassium: 4.2 mmol/L (ref 3.5–5.1)
Sodium: 143 mmol/L (ref 135–145)

## 2020-07-29 LAB — MAGNESIUM
Magnesium: 1.5 mg/dL — ABNORMAL LOW (ref 1.7–2.4)
Magnesium: 1.9 mg/dL (ref 1.7–2.4)

## 2020-07-29 LAB — BASIC METABOLIC PANEL
Anion gap: 14 (ref 5–15)
BUN: 66 mg/dL — ABNORMAL HIGH (ref 8–23)
CO2: 23 mmol/L (ref 22–32)
Calcium: 6.9 mg/dL — ABNORMAL LOW (ref 8.9–10.3)
Chloride: 104 mmol/L (ref 98–111)
Creatinine, Ser: 7.12 mg/dL — ABNORMAL HIGH (ref 0.61–1.24)
GFR, Estimated: 7 mL/min — ABNORMAL LOW (ref >60)
Glucose, Bld: 82 mg/dL (ref 70–99)
Potassium: 4.7 mmol/L (ref 3.5–5.1)
Sodium: 141 mmol/L (ref 135–145)

## 2020-07-29 LAB — HEPATIC FUNCTION PANEL
ALT: 126 U/L — ABNORMAL HIGH (ref 0–44)
AST: 233 U/L — ABNORMAL HIGH (ref 15–41)
Albumin: 2.8 g/dL — ABNORMAL LOW (ref 3.5–5.0)
Alkaline Phosphatase: 38 U/L (ref 38–126)
Bilirubin, Direct: 0.1 mg/dL (ref 0.0–0.2)
Indirect Bilirubin: 0.6 mg/dL (ref 0.3–0.9)
Total Bilirubin: 0.7 mg/dL (ref 0.3–1.2)
Total Protein: 5.1 g/dL — ABNORMAL LOW (ref 6.5–8.1)

## 2020-07-29 LAB — GLUCOSE, CAPILLARY
Glucose-Capillary: 64 mg/dL — ABNORMAL LOW (ref 70–99)
Glucose-Capillary: 89 mg/dL (ref 70–99)
Glucose-Capillary: 74 mg/dL (ref 70–99)
Glucose-Capillary: 66 mg/dL — ABNORMAL LOW (ref 70–99)
Glucose-Capillary: 62 mg/dL — ABNORMAL LOW (ref 70–99)
Glucose-Capillary: 80 mg/dL (ref 70–99)
Glucose-Capillary: 95 mg/dL (ref 70–99)
Glucose-Capillary: 86 mg/dL (ref 70–99)
Glucose-Capillary: 98 mg/dL (ref 70–99)

## 2020-07-29 LAB — CBC
HCT: 27.6 % — ABNORMAL LOW (ref 39.0–52.0)
Hemoglobin: 8.3 g/dL — ABNORMAL LOW (ref 13.0–17.0)
MCH: 27.3 pg (ref 26.0–34.0)
MCHC: 30.1 g/dL (ref 30.0–36.0)
MCV: 90.8 fL (ref 80.0–100.0)
Platelets: 75 10*3/uL — ABNORMAL LOW (ref 150–400)
RBC: 3.04 MIL/uL — ABNORMAL LOW (ref 4.22–5.81)
RDW: 19.1 % — ABNORMAL HIGH (ref 11.5–15.5)
WBC: 8.5 10*3/uL (ref 4.0–10.5)
nRBC: 0 % (ref 0.0–0.2)

## 2020-07-29 LAB — APTT: aPTT: 73 seconds — ABNORMAL HIGH (ref 24–36)

## 2020-07-29 LAB — HEPARIN LEVEL (UNFRACTIONATED): Heparin Unfractionated: 1.04 IU/mL — ABNORMAL HIGH (ref 0.30–0.70)

## 2020-07-29 MED ORDER — DEXTROSE 50 % IV SOLN: 12.5000 g | INTRAVENOUS | Status: AC

## 2020-07-29 MED ORDER — MAGNESIUM SULFATE 2 GM/50ML IV SOLN
2.0000 g | Freq: Once | INTRAVENOUS | Status: AC
  Administered 2020-07-29: 2 g via INTRAVENOUS
  Filled 2020-07-29: qty 50

## 2020-07-29 MED ORDER — MELATONIN 5 MG PO TABS
5.0000 mg | ORAL_TABLET | Freq: Every day | ORAL | Status: DC
  Administered 2020-07-29 – 2020-08-04 (×6): 5 mg via ORAL
  Filled 2020-07-29 (×6): qty 1

## 2020-07-29 MED ORDER — PANTOPRAZOLE SODIUM 40 MG PO TBEC
40.0000 mg | DELAYED_RELEASE_TABLET | Freq: Every day | ORAL | Status: DC
  Administered 2020-07-30 – 2020-08-04 (×6): 40 mg via ORAL
  Filled 2020-07-29 (×6): qty 1

## 2020-07-29 MED ORDER — MELATONIN 3 MG PO TABS: 3.0000 mg | ORAL_TABLET | Freq: Every day | ORAL | Status: DC

## 2020-07-29 MED ORDER — DEXTROSE 50 % IV SOLN
INTRAVENOUS | Status: AC
  Filled 2020-07-29: qty 50

## 2020-07-29 MED ORDER — CHLORHEXIDINE GLUCONATE CLOTH 2 % EX PADS
6.0000 | MEDICATED_PAD | Freq: Every day | CUTANEOUS | Status: DC
  Administered 2020-07-29 – 2020-08-18 (×20): 6 via TOPICAL

## 2020-07-29 NOTE — Progress Notes (Signed)
NAME:  Todd Lloyd, MRN:  [id number], DOB:  Jun 07, 1940, LOS: 3 ADMISSION DATE:  07/26/2020, CONSULTATION DATE:  07/26/2020 REFERRING MD:  Dr. Eulis Foster , CHIEF COMPLAINT:  Encephalopathy, Hypotension    Brief History  80 yo PMH COPD, NSCLC s/p SBRT, admitted 07/26/20 with AMS after fall.  Has had diarrhea-- got dizzy when he got up to use restroom, slipped, and remained down.  AKI, rhabdo, sepsis, shock (septic + hypovolemia), hypercarbic and hypoxic resp failure on BiPAP.  Past Medical History:  COPD (followed by Dr. 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   Significant Hospital Events:  3/7 > Presents to ED 3/8> Neuro consulted for AMS. improving mentation with metabolic correction, continues on BiPAP. Incr Bicarb gtt   Consults:  PCCM Neuro  Procedures:    Significant Diagnostic Tests:  CT Head 3/7 > 1. No evidence of acute intracranial abnormality. 2. Left frontal scalp contusion without acute fracture. CT C-Spine 3/7 > 1. No evidence of acute fracture. 2. Rotation of C1 on C2 is likely positional in the absence of a fixed torticollis. 3. Bulky facet hypertrophy at multiple levels, greatest on the left at C4-C5 where there is likely at least moderate bony foraminal stenosis. 4. Left eccentric degenerative disc disease in the lower cervical spine, greatest at C7-T1. CT a/p 3/7> focal colitis without evidence of perf. Non obstructing L renal stone. Calcified L adrenal hematoma. Stable RLL mass lesion, associated R effusion CXR 3/7 > Cardiac shadow is mildly enlarged but stable. Aortic calcifications are seen. Known right lower lobe mass lesion is not well appreciated on this exam. No infiltrate or sizable effusion is seen. No acute bony abnormality is noted. EEG 3/8>>study is suggestive of moderate  diffuse encephalopathy, nonspecific etiology. No seizures or epileptiform discharges were seen throughout the recording TTE limited 3/8 >> 1. Left ventricular ejection fraction, by estimation, is 55 to 60%. The left ventricle has normal function. Left ventricular diastolic function could not be evaluated.  2. The mitral valve is grossly normal. No evidence of mitral stenosis.  3. Tricuspid valve regurgitation is mild to moderate.  4. The aortic valve is grossly normal. Aortic valve regurgitation is trivial. No aortic stenosis is present.  5. There is moderately elevated pulmonary artery systolic pressure.  Renal u/s 3/10>> no hydro  Micro Data:  3/7 BCx  >> 3/7 UCx >>negative  3/7 COVID/ flu >> Negative  3/7 CDiff >> neg 3/8 GI PCR >> Rotavirus A/ enteropathogenic E. Coli  3/8 MRSA >> neg  Antimicrobials:  3/7 Acyclovir > 3/8 3/7 Ampicillin > 3/8 3/7 Rocephin meningeal coverage (reduced dose 3/8) > 3/9 3/7 Vancomycin > 3/8 3/7 Flagyl > 3/8 3/9 azithro, one time dose oral for enteropathogenic E. Coli   Interim History / Subjective:  Still having watery bilious (non bloody) stools - flexiseal  Awake, alert, off bipap  Off levo  Making small amt urine ~52ml since midnight    Objective   Blood pressure (!) 83/54, pulse 86, temperature 98.1 F (36.7 C), temperature source Axillary, resp. rate 15, height 6' (1.829 m), weight 104.9 kg, SpO2 (!) 82 %.    FiO2 (%):  [40 %-50 %] 40 %   Intake/Output Summary (Last 24 hours) at 07/29/2020 0839 Last data filed at 07/29/2020 0700 Gross per 24 hour  Intake 3608.91 ml  Output 1028 ml  Net 2580.91 ml   Filed Weights   07/27/20 0049 07/27/20 0155 07/28/20 2000  Weight: 97.3 kg 97.3 kg 104.9 kg    Examination: General: elderly male, NAD, awake, follows commands  HEENT: MM moist, no JVD Neuro: Awake, alert, mostly appropriate, MAE  CV: IR IR, afib- rate controlled, no murmur PULM:  resps even non labored on [place], diminished bases,  no audible wheeze  GI: distended, mildly tender- generalized, hyperBS, foley. flexiseal with watery diarrhea  Extremities: warm/dry, no LE edema   Resolved Hospital Problem list     Assessment & Plan:   Acute respiratory failure with hypoxia and hypercarbia - improving  COPD Stg 1 R Lung Ca s/p radiation -follows with Dr. Lamonte Sakai  P - BiPAP qhs and PRN  - wean supplemental O2 for sat goal > 92% - continue duoneb, pulmicort, albuterol   Acute metabolic encephalopathy - improving. Neuro saw and signed off, EEG neg   Hx dementia  -septic shock, AKI, hypercarbia Plan - with improving mentation, will defer MRI at this time - remains non-focal, continue neuro exams - home namenda and aricept restarted   Shock-- suspect septic, +/- component of hypovolemia in setting of Rotavirus and enteropathogenic E. Coli -Previous ECHO 02/2020 with EF 60-65, LV normal function  -with AMS, was started on empiric meningitis coverage. Stopped 3/8  - pt denies any sick contacts 3/9 Plan - continue MIVF  with ongoing diarrhea  - maintaining off pressors for now  - TTE as above, EF 55-60%, limited study, DD indeterminate - s/p azithro x 1 orally for enteropathogenic E. Coli - follow other culture data, trend WBC/ fever curve    Rotavirus and enteropathogenic E. Coli - no contact precautions required per infectious control - azithro orally x 1 today for enteropathogenic E. Coli - ongoing supportive care and fluid resuscitation  - loperamide prn   AKI Rhabdomyolysis, rising CK NGMA -fell on way to bathroom, remained on ground for extended period  Plan  - CK down trending 12400-> 9700 - sCr trending up but mentation improving, ?UOP picking up, K wnl - continue foley for now - Renal following - no urgent indication for HD but may ultimately need - per family would be open to short term HD  - trending renal indices - Avoid nephrotoxic agents, ensure adequate renal perfusion   Hyperkalemia,  mild, resolved Hyperphosphatemia Hypocalcemia P - trend BMET - Replace electrolytes as indicated  Afib Plan - Hold Digoxin until renal function better - continue heparin per pharmacy, holding Eliquis for now - remains rate controlled - electrolyte optimization   Thrombocytopenia - likely due to sepsis, monitor while on heparin - no signs of bleeding, H/H stable    Best practice (evaluated daily)  Diet: start regular diet DVT prophylaxis: heparin gtt GI prophylaxis: PPI Glucose control: CBG  Mobility: Bedrest Disposition:Admit to ICU   Goals of Care:  Last date of multidisciplinary goals of care discussion: CCM NP spoke with family overnight on admission 3/8 -- daughter is retired Tree surgeon and would like to be included in discussions RE dialysis or intubation should these interventions become recommended.  Family and staff present:  Summary of discussion:  Follow up goals of care discussion due: 3/13 Code Status: Full Code    Labs   CBC: Recent Labs  Lab 07/26/20 1727 07/26/20 1748 07/27/20 0136 07/27/20 0254 07/27/20 0301 07/27/20 0950 07/28/20 0103 07/29/20 0108  WBC 21.2*  --  25.8*  --   --   --  16.0* 8.5  HGB 9.8*   < > 9.7* 10.9* 10.9* 10.2* 9.1* 8.3*  HCT 34.9*   < > 33.8* 32.0* 32.0* 30.0* 30.4* 27.6*  MCV 95.4  --  94.9  --   --   --  91.0 90.8  PLT PLATELET CLUMPS NOTED ON SMEAR, UNABLE TO ESTIMATE  --  116*  --   --   --  86* 75*   < > = values in this interval not displayed.    Basic Metabolic Panel: Recent Labs  Lab 07/26/20 1727 07/26/20 1748 07/26/20 1757 07/27/20 0136 07/27/20 0254 07/27/20 0301 07/27/20 0950 07/27/20 1457 07/28/20 0103 07/29/20 0108  NA 138 142  141   < > 139   < > 141 143 141 143 143  K 4.6 4.7  4.7   < > 5.4*   < > 5.3* 4.6 4.6 4.0 4.2  CL 106 108  --  107  --   --   --  104 105 100  CO2 22  --   --  19*  --   --   --  25 23 25   GLUCOSE 84 81  --  105*  --   --   --  114* 99 69*  BUN 46* 49*  --  52*  --   --    --  59* 58* 61*  CREATININE 3.57* 3.60*  --  3.89*  --   --   --  4.71* 5.23* 6.45*  CALCIUM 7.5*  --   --  7.2*  --   --   --  7.6* 7.0* 7.0*  MG  --   --   --  1.9  --   --   --   --   --  1.5*  PHOS  --   --   --  7.5*  --   --   --   --   --  5.3*   < > = values in this interval not displayed.   GFR: Estimated Creatinine Clearance: 11.4 mL/min (A) (by C-G formula based on SCr of 6.45 mg/dL (H)). Recent Labs  Lab 07/26/20 1727 07/26/20 2042 07/26/20 2206 07/27/20 0136 07/28/20 0103 07/29/20 0108  PROCALCITON  --  26.97  --  39.52 61.41  --   WBC 21.2*  --   --  25.8* 16.0* 8.5  LATICACIDVEN 1.3  --  1.3  --   --   --     Liver Function Tests: Recent Labs  Lab 07/26/20 1727 07/27/20 0136 07/28/20 0103 07/29/20 0108  AST 131* 231* 271* 233*  ALT 67* 86* 117* 126*  ALKPHOS 46 42 38 38  BILITOT 1.0 0.8 0.6 0.7  PROT 5.6* 5.7* 5.4* 5.1*  ALBUMIN 3.1* 3.4* 3.1* 2.8*  2.8*   No results for input(s): LIPASE, AMYLASE in the last 168 hours. No results for input(s): AMMONIA in the last 168 hours.  ABG    Component Value Date/Time   PHART 7.273 (L) 07/27/2020 0950   PCO2ART 51.9 (H) 07/27/2020 0950   PO2ART 134 (H) 07/27/2020 0950   HCO3 24.1 07/27/2020 0950   TCO2 26 07/27/2020 0950   ACIDBASEDEF 3.0 (H) 07/27/2020 0950   O2SAT 99.0 07/27/2020 0950     Coagulation Profile: Recent Labs  Lab 07/26/20 1802  INR 1.7*    Cardiac Enzymes: Recent Labs  Lab 07/26/20 1727 07/26/20 2059 07/27/20 0136 07/27/20 1120 07/28/20 0103  CKTOTAL 4,945* 7,306* 11,849*  10,573* 12,472* 9,746*  HbA1C: No results found for: HGBA1C  CBG: Recent Labs  Lab 07/29/20 0304 07/29/20 0350 07/29/20 0550 07/29/20 0711 07/29/20 0819  GLUCAP 64* 89 74 66* 62*     CRITICAL CARE  Total critical care time: 40 minutes  Critical care time was exclusive of separately billable procedures and treating other patients.  Critical care was necessary to treat or prevent  imminent or life-threatening deterioration.  Critical care was time spent personally by me on the following activities: development of treatment plan with patient and/or surrogate as well as nursing, discussions with consultants, evaluation of patient's response to treatment, examination of patient, obtaining history from patient or surrogate, ordering and performing treatments and interventions, ordering and review of laboratory studies, ordering and review of radiographic studies, pulse oximetry and re-evaluation of patient's condition.  Nickolas Madrid, NP Pulmonary/Critical Care Medicine  07/29/2020  8:39 AM    See Amion for pager If no response to pager, please call [phone] until 7pm After 7:00 pm call Elink  [phone]?[place]

## 2020-07-29 NOTE — Evaluation (Signed)
Physical Therapy Evaluation Patient Details Name: Todd Lloyd MRN: [id number] DOB: 11/30/1940 Today's Date: 07/29/2020   History of Present Illness  80 yo admitted 3/7 after fall and remained on floor at home 6 hrs with rhabdomyolysis, acute respiratory failure, AMS, AKI. PMhx; COPD, lung CA, dementia, Afib, leukemia  Clinical Impression  Pt pleasant and initially able to state he was in the hospital and date but situation stating "Well, Dr.Byrum recommended it". Pt reports he has to provide significant care for his wife since her CVA and he is currently perseverating on being able to return home by next date to care for her. Pt with no awareness of his own deficits or need for assist and lack of ability to care for himself let alone her. Pt reports family cannot stay with wife and anticipate pt and wife may both need placement. Pt with decreased cognition, balance, safety and function who will benefit from acute therapy to maximize independence and safety. Pt aware of need for BM and requested BSC use despite education for flexiseal due to liquid stool at which time pt voided flexiseal balloon and due to this returned pt to bed for nursing care. Pt on 4L SPO2 on arrival with probe on his toe not reading with probe moved to ear SpO2 maintained 85% even on 6L. Bumped to 8L with SpO2 92%.    Follow Up Recommendations SNF;Supervision/Assistance - 24 hour    Equipment Recommendations  Rolling walker with 5" wheels    Recommendations for Other Services OT consult     Precautions / Restrictions Precautions Precautions: Fall Precaution Comments: flexiseal      Mobility  Bed Mobility Overal bed mobility: Needs Assistance Bed Mobility: Supine to Sit;Sit to Supine     Supine to sit: Min guard;HOB elevated Sit to supine: Min guard   General bed mobility comments: guarding for safety as pt attempting to get up 2x prior to lines moved. no physical assist to rise from surface with HOB 25  degrees and pt able to return to bed with bed flat and slide himself toward [facility]    Transfers Overall transfer level: Needs assistance   Transfers: Sit to/from Stand;Stand Pivot Transfers Sit to Stand: Min guard Stand pivot transfers: Min assist       General transfer comment: guarding with mod cues for hand placement to stand from bed and bSC. Pivot bed<>BSC with min assist for stability pt impulsively searching for places to put his hands. Deferred OOB to chair due to pt voiding flexiseal on [facility]  Ambulation/Gait             General Gait Details: not safe to attempt without +2 assist  Stairs            Wheelchair Mobility    Modified Rankin (Stroke Patients Only)       Balance Overall balance assessment: Needs assistance;History of Falls   Sitting balance-Leahy Scale: Fair Sitting balance - Comments: eOB and BSC without UE support   Standing balance support: Single extremity supported Standing balance-Leahy Scale: Poor Standing balance comment: UE support on rail or therapist in standing                             Pertinent Vitals/Pain Pain Assessment: No/denies pain    Home Living Family/patient expects to be discharged to:: Private residence Living Arrangements: Spouse/significant other Available Help at Discharge: Family;Available PRN/intermittently Type of Home: House Home Access: Stairs to enter  Entrance Stairs-Number of Steps: 2 Home Layout: One level Home Equipment: Walker - 2 wheels      Prior Function Level of Independence: Independent               Hand Dominance        Extremity/Trunk Assessment   Upper Extremity Assessment Upper Extremity Assessment: Generalized weakness    Lower Extremity Assessment Lower Extremity Assessment: Generalized weakness    Cervical / Trunk Assessment Cervical / Trunk Assessment: Kyphotic  Communication   Communication: No difficulties  Cognition Arousal/Alertness:  Awake/alert Behavior During Therapy: Impulsive;Restless Overall Cognitive Status: Impaired/Different from baseline Area of Impairment: Orientation;Memory;Following commands;Safety/judgement;Problem solving                 Orientation Level: Situation   Memory: Decreased short-term memory;Decreased recall of precautions Following Commands: Follows one step commands consistently Safety/Judgement: Decreased awareness of safety;Decreased awareness of deficits   Problem Solving: Slow processing General Comments: perseverating on wanting to get home to wife today and asking if all ICU equipment would go home with him      General Comments      Exercises     Assessment/Plan    PT Assessment Patient needs continued PT services  PT Problem List Decreased strength;Decreased mobility;Decreased safety awareness;Decreased activity tolerance;Decreased cognition;Decreased balance;Decreased knowledge of use of DME;Decreased coordination       PT Treatment Interventions DME instruction;Therapeutic exercise;Gait training;Balance training;Functional mobility training;Therapeutic activities;Patient/family education;Cognitive remediation;Neuromuscular re-education;Stair training    PT Goals (Current goals can be found in the Care Plan section)  Acute Rehab PT Goals Patient Stated Goal: return home to care for my wife PT Goal Formulation: With patient Time For Goal Achievement: 08/12/20 Potential to Achieve Goals: Fair    Frequency Min 2X/week   Barriers to discharge Decreased caregiver support      Co-evaluation               AM-PAC PT "6 Clicks" Mobility  Outcome Measure Help needed turning from your back to your side while in a flat bed without using bedrails?: A Little Help needed moving from lying on your back to sitting on the side of a flat bed without using bedrails?: A Little Help needed moving to and from a bed to a chair (including a wheelchair)?: A Little Help  needed standing up from a chair using your arms (e.g., wheelchair or bedside chair)?: A Little Help needed to walk in hospital room?: A Lot Help needed climbing 3-5 steps with a railing? : Total 6 Click Score: 15    End of Session Equipment Utilized During Treatment: Gait belt Activity Tolerance: Patient tolerated treatment well Patient left: in bed;with call bell/phone within reach;with bed alarm set;with nursing/sitter in room Nurse Communication: Mobility status PT Visit Diagnosis: Other abnormalities of gait and mobility (R26.89);Difficulty in walking, not elsewhere classified (R26.2);Muscle weakness (generalized) (M62.81)    Time: 7829-5621 PT Time Calculation (min) (ACUTE ONLY): 35 min   Charges:   PT Evaluation $PT Eval High Complexity: 1 High PT Treatments $Therapeutic Activity: 8-22 mins        Samiyah Stupka P, PT Acute Rehabilitation Services Pager: [phone] Office: [phone]   Sandy Salaam Tiersa Dayley 07/29/2020, 12:37 PM

## 2020-07-29 NOTE — Progress Notes (Signed)
Nephrology Follow-Up Consult note   Assessment/Recommendations: Todd Lloyd is a/an 80 y.o. male with a past medical history significant for COPD, lung cancer status post treatment, dementia, atrial fibrillation who present w/ sepsis now with AKI  Oliguric AKI, unstable worsening: Likely secondary to ATN from shock as well as rhabdomyolysis.  Significant AKI before he even arrived to the hospital.  Some component to dehydration.  Continues to worsen at this time -Continue management of shock as below, overall improving -No indication for dialysis at this time.  Patient would be amenable to dialysis at the time came.  Trying her best to avoid for now -Continue to monitor daily Cr, Dose meds for GFR -Monitor Daily I/Os, Daily weight  -Maintain MAP>65 for optimal renal perfusion.  -Avoid nephrotoxic medications including NSAIDs and Vanc/Zosyn combo -Renal ultrasound without signs of obstruction -Agree with holding losartan and diuretics -Can continue lactated Ringer's but would stop for worsening hypoxia  Acute hypoxic and hypercarbic respiratory failure: History of COPD.  Management per primary.  Supplemental oxygen as needed  Shock: Likely rotavirus and possibly E. coli.    Can continue fluids for today.  Antimicrobial agents per primary team.  No longer requiring norepinephrine  Altered mental status: Improving likely related to septic shock.  Continue to monitor  Hypertension: Blood pressure low at this time related to shock.  Holding home medications     Recommendations conveyed to primary service.    [facility] 07/29/2020 10:25 AM  ___________________________________________________________  CC: AKI  Interval History/Subjective: Patient appears more awake and alert today.  Eating with no issues.  States that he feels well   Medications:  Current Facility-Administered Medications  Medication Dose Route Frequency Provider Last  Rate Last Admin  . 0.9 %  sodium chloride infusion  250 mL Intravenous Continuous Omar Person, NP 10 mL/hr at 07/29/20 0957 Infusion Verify at 07/29/20 0957  . albuterol (PROVENTIL) (2.5 MG/3ML) 0.083% nebulizer solution 2.5 mg  2.5 mg Nebulization Q4H PRN Omar Person, NP      . budesonide (PULMICORT) nebulizer solution 0.5 mg  0.5 mg Nebulization BID Renee Pain, MD   0.5 mg at 07/29/20 0736  . chlorhexidine (PERIDEX) 0.12 % solution 15 mL  15 mL Mouth Rinse BID Renee Pain, MD   15 mL at 07/29/20 0923  . Chlorhexidine Gluconate Cloth 2 % PADS 6 each  6 each Topical Q0600 Renee Pain, MD   6 each at 07/28/20 1616  . dextrose 50 % solution 12.5 g  12.5 g Intravenous STAT Collene Gobble, MD      . dextrose 50 % solution           . donepezil (ARICEPT) tablet 5 mg  5 mg Oral QHS Jennelle Human B, NP   5 mg at 07/28/20 2123  . Gerhardt's butt cream   Topical QID PRN Jennelle Human B, NP   Given at 07/28/20 1128  . heparin ADULT infusion 100 units/mL (25000 units/277mL)  1,100 Units/hr Intravenous Continuous Henri Medal, RPH 11 mL/hr at 07/29/20 0957 1,100 Units/hr at 07/29/20 0957  . ipratropium-albuterol (DUONEB) 0.5-2.5 (3) MG/3ML nebulizer solution 3 mL  3 mL Nebulization Q6H Renee Pain, MD   3 mL at 07/29/20 0736  . lactated ringers infusion   Intravenous Continuous Reesa Chew, MD 125 mL/hr at 07/29/20 0957 Infusion Verify at 07/29/20 0957  . loperamide (IMODIUM) capsule 2 mg  2 mg Oral PRN Arnell Asal, NP  2 mg at 07/28/20 1127  . MEDLINE mouth rinse  15 mL Mouth Rinse q12n4p Renee Pain, MD   15 mL at 07/28/20 1616  . melatonin tablet 5 mg  5 mg Oral QHS Priscella Mann, RPH      . memantine (NAMENDA) tablet 5 mg  5 mg Oral BID Jennelle Human B, NP   5 mg at 07/29/20 0923  . norepinephrine (LEVOPHED) 4mg  in 234mL premix infusion  2-10 mcg/min Intravenous Titrated Omar Person, NP   Stopped at 07/28/20 [phone]  . [START ON 07/30/2020]  pantoprazole (PROTONIX) EC tablet 40 mg  40 mg Oral Daily Millen, Jessica B, RPH      . polyethylene glycol (MIRALAX / GLYCOLAX) packet 17 g  17 g Oral Daily PRN Omar Person, NP          Review of Systems: 10 systems reviewed and negative except per interval history/subjective  Physical Exam: Vitals:   07/29/20 0815 07/29/20 0900  BP: (!) 83/54 122/71  Pulse: 86 97  Resp: 15 19  Temp:    SpO2: (!) 82% 96%   Total I/O In: 430.8 [I.V.:430.8] Out: -   Intake/Output Summary (Last 24 hours) at 07/29/2020 1025 Last data filed at 07/29/2020 0957 Gross per 24 hour  Intake 3726.56 ml  Output 1028 ml  Net 2698.56 ml   General: Ill-appearing, lying in bed, no distress HEENT: anicteric sclera, oropharynx clear without lesions CV: Normal rate, trace edema in the lower extremities bilaterally Lungs: Coarse bilateral breath sounds, mild increased work of breathing Abd: soft, non-tender Skin: no visible lesions or rashes Psych: alert, not engaged, mood and affect seem appropriate Musculoskeletal: no obvious deformities Neuro: normal speech, no obvious specific deficits   Test Results I personally reviewed new and old clinical labs and radiology tests Lab Results  Component Value Date   NA 143 07/29/2020   K 4.2 07/29/2020   CL 100 07/29/2020   CO2 25 07/29/2020   BUN 61 (H) 07/29/2020   CREATININE 6.45 (H) 07/29/2020   CALCIUM 7.0 (L) 07/29/2020   ALBUMIN 2.8 (L) 07/29/2020   ALBUMIN 2.8 (L) 07/29/2020   PHOS 5.3 (H) 07/29/2020

## 2020-07-29 NOTE — Progress Notes (Signed)
eLink Physician-Brief Progress Note Patient Name: Todd Lloyd DOB: 01-Jun-1940 MRN: [id number]   Date of Service  07/29/2020  HPI/Events of Note  Patient unable to sleep.   eICU Interventions  Plan: 1. Melatonin 3 mg PO now and Q HS PRN sleep.      Intervention Category Major Interventions: Other:  Tinzlee Craker Cornelia Copa 07/29/2020, 1:51 AM

## 2020-07-29 NOTE — Progress Notes (Signed)
Patient went into sinus tach in 130s for a few seconds and then back into a-fib with the rate in the 90s. 12-lead EKG done. Elink called and notified. BMP and magnesium ordered. Will continue to monitor.

## 2020-07-29 NOTE — Progress Notes (Signed)
[facility] for Heparin Indication: atrial fibrillation while apixaban on hold  Allergies  Allergen Reactions  . Lyrica [Pregabalin]     confusion  . Morphine And Related     confusion    Patient Measurements: Height: 6' (182.9 cm) Weight: 104.9 kg (231 lb 4.2 oz) IBW/kg (Calculated) : 77.6 Heparin Dosing Weight: 97.1 kg   Vital Signs: Temp: 98.1 F (36.7 C) (03/10 0700) Temp Source: Axillary (03/10 0700) BP: 83/54 (03/10 0815) Pulse Rate: 86 (03/10 0815)  Labs: Recent Labs    07/26/20 1802 07/26/20 2042 07/26/20 2059 07/26/20 2332 07/27/20 0136 07/27/20 0254 07/27/20 0950 07/27/20 1120 07/27/20 1457 07/27/20 2254 07/28/20 0103 07/28/20 0909 07/28/20 1931 07/29/20 0108  HGB  --   --    < >  --  9.7*   < > 10.2*  --   --   --  9.1*  --   --  8.3*  HCT  --   --    < >  --  33.8*   < > 30.0*  --   --   --  30.4*  --   --  27.6*  PLT  --   --   --   --  116*  --   --   --   --   --  86*  --   --  75*  APTT 35  --   --   --   --   --   --   --   --  120*  --  119* 87* 73*  LABPROT 19.2*  --   --   --   --   --   --   --   --   --   --   --   --   --   INR 1.7*  --   --   --   --   --   --   --   --   --   --   --   --   --   HEPARINUNFRC  --   --   --   --   --   --   --   --   --  >2.20*  --   --   --  1.04*  CREATININE  --   --   --   --  3.89*  --   --   --  4.71*  --  5.23*  --   --  6.45*  CKTOTAL  --   --    < >  --  11,849*  10,573*  --   --  12,472*  --   --  9,746*  --   --   --   TROPONINIHS  --  433*  --  522*  --   --   --   --   --   --   --   --   --   --    < > = values in this interval not displayed.    Estimated Creatinine Clearance: 11.4 mL/min (A) (by C-G formula based on SCr of 6.45 mg/dL (H)).   Assessment: 80 yo male presented on 07/27/2020 with AMS after fall. Patient is on apixaban prior to admission for Afib which was held for possible LP. Now menigitis unlikely and no plans for LP. Pharmacy consulted to  dose heparin. Last dose apixaban 3/7 PM. aPTT 35 on admission. Patient also in rhabdomyolysis with Scr  rising. CT head negative for hemorrhage.   APTT therapeutic at 73 sec after rate reduction from earlier today; no bleeding reported per RN.  Goal of Therapy:  Heparin level 0.3-0.7 units/ml aPTT 66-102 seconds Monitor platelets by anticoagulation protocol: Yes   Plan:  Continue heparin gtt at 1100 units/hr Follow daily aPTT level and heparin level until correlation  Benna Dunks  PharmD Candidate, Class of 2022 07/29/2020, 8:49 AM

## 2020-07-30 ENCOUNTER — Inpatient Hospital Stay (HOSPITAL_COMMUNITY): Payer: Medicare Other

## 2020-07-30 DIAGNOSIS — D649 Anemia, unspecified: Secondary | ICD-10-CM | POA: Diagnosis not present

## 2020-07-30 DIAGNOSIS — K219 Gastro-esophageal reflux disease without esophagitis: Secondary | ICD-10-CM

## 2020-07-30 DIAGNOSIS — R29898 Other symptoms and signs involving the musculoskeletal system: Secondary | ICD-10-CM

## 2020-07-30 DIAGNOSIS — F0390 Unspecified dementia without behavioral disturbance: Secondary | ICD-10-CM

## 2020-07-30 DIAGNOSIS — W888XXA Exposure to other ionizing radiation, initial encounter: Secondary | ICD-10-CM

## 2020-07-30 DIAGNOSIS — J9602 Acute respiratory failure with hypercapnia: Secondary | ICD-10-CM | POA: Diagnosis not present

## 2020-07-30 DIAGNOSIS — I480 Paroxysmal atrial fibrillation: Secondary | ICD-10-CM

## 2020-07-30 DIAGNOSIS — N179 Acute kidney failure, unspecified: Secondary | ICD-10-CM | POA: Diagnosis not present

## 2020-07-30 LAB — GLUCOSE, CAPILLARY
Glucose-Capillary: 102 mg/dL — ABNORMAL HIGH (ref 70–99)
Glucose-Capillary: 110 mg/dL — ABNORMAL HIGH (ref 70–99)
Glucose-Capillary: 112 mg/dL — ABNORMAL HIGH (ref 70–99)
Glucose-Capillary: 116 mg/dL — ABNORMAL HIGH (ref 70–99)
Glucose-Capillary: 41 mg/dL — CL (ref 70–99)
Glucose-Capillary: 76 mg/dL (ref 70–99)
Glucose-Capillary: 83 mg/dL (ref 70–99)

## 2020-07-30 LAB — RENAL FUNCTION PANEL
Albumin: 3 g/dL — ABNORMAL LOW (ref 3.5–5.0)
Anion gap: 13 (ref 5–15)
BUN: 66 mg/dL — ABNORMAL HIGH (ref 8–23)
CO2: 26 mmol/L (ref 22–32)
Calcium: 6.9 mg/dL — ABNORMAL LOW (ref 8.9–10.3)
Chloride: 101 mmol/L (ref 98–111)
Creatinine, Ser: 7.31 mg/dL — ABNORMAL HIGH (ref 0.61–1.24)
GFR, Estimated: 7 mL/min — ABNORMAL LOW (ref >60)
Glucose, Bld: 86 mg/dL (ref 70–99)
Phosphorus: 5.5 mg/dL — ABNORMAL HIGH (ref 2.5–4.6)
Potassium: 3.9 mmol/L (ref 3.5–5.1)
Sodium: 140 mmol/L (ref 135–145)

## 2020-07-30 LAB — CBC
HCT: 27 % — ABNORMAL LOW (ref 39.0–52.0)
Hemoglobin: 8.2 g/dL — ABNORMAL LOW (ref 13.0–17.0)
MCH: 27.6 pg (ref 26.0–34.0)
MCHC: 30.4 g/dL (ref 30.0–36.0)
MCV: 90.9 fL (ref 80.0–100.0)
Platelets: 84 10*3/uL — ABNORMAL LOW (ref 150–400)
RBC: 2.97 MIL/uL — ABNORMAL LOW (ref 4.22–5.81)
RDW: 19.3 % — ABNORMAL HIGH (ref 11.5–15.5)
WBC: 6.1 10*3/uL (ref 4.0–10.5)
nRBC: 0 % (ref 0.0–0.2)

## 2020-07-30 LAB — MAGNESIUM: Magnesium: 2 mg/dL (ref 1.7–2.4)

## 2020-07-30 LAB — HEPARIN LEVEL (UNFRACTIONATED): Heparin Unfractionated: 0.5 IU/mL (ref 0.30–0.70)

## 2020-07-30 LAB — APTT: aPTT: 59 seconds — ABNORMAL HIGH (ref 24–36)

## 2020-07-30 MED ORDER — APIXABAN 2.5 MG PO TABS
2.5000 mg | ORAL_TABLET | Freq: Two times a day (BID) | ORAL | Status: DC
  Administered 2020-07-30 – 2020-08-05 (×13): 2.5 mg via ORAL
  Filled 2020-07-30 (×13): qty 1

## 2020-07-30 MED ORDER — FUROSEMIDE 10 MG/ML IJ SOLN
160.0000 mg | Freq: Once | INTRAVENOUS | Status: AC
  Administered 2020-07-30: 160 mg via INTRAVENOUS
  Filled 2020-07-30: qty 16

## 2020-07-30 NOTE — Progress Notes (Signed)
Called to bedside to speak to patient's wife, daughter, and Mr. [place] about code status. He wanted to be DNR since admission but it was never changed in the chart apparently. They have requested this change to be made now. They are interested in completing a MOST form this admission. Code status updated in epic.  Julian Hy, DO 07/30/20 3:11 PM [facility]  From 7AM- 7PM if no response to pager, please call [phone]. After hours, 7PM- 7AM, please call Elink  [phone].

## 2020-07-30 NOTE — Progress Notes (Signed)
Nephrology Follow-Up Consult note   Assessment/Recommendations: Todd Lloyd is a/an 80 y.o. male with a past medical history significant for COPD, lung cancer status post treatment, dementia, atrial fibrillation who present w/ sepsis now with AKI  Oliguric AKI, unstable worsening: Likely secondary to ATN from shock as well as rhabdomyolysis.  Significant AKI before he even arrived to the hospital.    The patient demonstrates some signs of possible uremia today with, with seem to me, as myoclonic jerking.  Patient is not significantly altered and communicates that he does not want dialysis. -Holding on dialysis for now but does have possible uremia.  If altered mental status worsens may need to consider dialysis again later today or tomorrow -Give Lasix 160 mg x 1 to perform FST for prognostication -Continue management of shock as below -Continue to monitor daily Cr, Dose meds for GFR -Monitor Daily I/Os, Daily weight  -Maintain MAP>65 for optimal renal perfusion.  -Avoid nephrotoxic medications including NSAIDs and Vanc/Zosyn combo -Renal ultrasound without signs of obstruction -Agree with holding losartan and diuretics -Currently no IV fluids running  Acute hypoxic and hypercarbic respiratory failure: History of COPD.  Management per primary.  Supplemental oxygen as needed  Shock: Likely rotavirus and possibly E. coli.    Overall improved.  Shock improving  Altered mental status: Seems to be improving likely related to septic shock.  Continue to monitor  Hypertension: Blood pressure low at this time related to shock.  Holding home medications     Recommendations conveyed to primary service.    [facility] 07/30/2020 10:24 AM  ___________________________________________________________  CC: AKI  Interval History/Subjective: Patient states he feels okay but just tired.  Creatinine slightly higher today but BUN overall stable.  Urine  output not robust.  Patient states he would not like dialysis at this time.   Medications:  Current Facility-Administered Medications  Medication Dose Route Frequency Provider Last Rate Last Admin  . 0.9 %  sodium chloride infusion  250 mL Intravenous Continuous Omar Person, NP 10 mL/hr at 07/30/20 1000 Infusion Verify at 07/30/20 1000  . albuterol (PROVENTIL) (2.5 MG/3ML) 0.083% nebulizer solution 2.5 mg  2.5 mg Nebulization Q4H PRN Omar Person, NP      . apixaban Arne Cleveland) tablet 2.5 mg  2.5 mg Oral BID Noemi Chapel P, DO      . budesonide (PULMICORT) nebulizer solution 0.5 mg  0.5 mg Nebulization BID Renee Pain, MD   0.5 mg at 07/30/20 0752  . chlorhexidine (PERIDEX) 0.12 % solution 15 mL  15 mL Mouth Rinse BID Renee Pain, MD   15 mL at 07/29/20 2126  . Chlorhexidine Gluconate Cloth 2 % PADS 6 each  6 each Topical Q0600 Collene Gobble, MD   6 each at 07/29/20 2037  . donepezil (ARICEPT) tablet 5 mg  5 mg Oral QHS Jennelle Human B, NP   5 mg at 07/29/20 2127  . furosemide (LASIX) 160 mg in dextrose 5 % 50 mL IVPB  160 mg Intravenous Once Reesa Chew, MD      . Gerhardt's butt cream   Topical QID PRN Arnell Asal, NP   Given at 07/28/20 1128  . ipratropium-albuterol (DUONEB) 0.5-2.5 (3) MG/3ML nebulizer solution 3 mL  3 mL Nebulization Q6H Renee Pain, MD   3 mL at 07/30/20 0752  . lactated ringers infusion   Intravenous Continuous Collene Gobble, MD 10 mL/hr at 07/30/20 1000 Infusion Verify at 07/30/20 1000  .  loperamide (IMODIUM) capsule 2 mg  2 mg Oral PRN Jennelle Human B, NP   2 mg at 07/29/20 2206  . MEDLINE mouth rinse  15 mL Mouth Rinse q12n4p Renee Pain, MD   15 mL at 07/29/20 1200  . melatonin tablet 5 mg  5 mg Oral QHS Sloan Leiter B, RPH   5 mg at 07/29/20 2126  . memantine (NAMENDA) tablet 5 mg  5 mg Oral BID Jennelle Human B, NP   5 mg at 07/29/20 2126  . norepinephrine (LEVOPHED) 4mg  in 286mL premix infusion  2-10 mcg/min  Intravenous Titrated Omar Person, NP   Stopped at 07/28/20 [phone]  . pantoprazole (PROTONIX) EC tablet 40 mg  40 mg Oral Daily Millen, Jessica B, RPH      . polyethylene glycol (MIRALAX / GLYCOLAX) packet 17 g  17 g Oral Daily PRN Omar Person, NP          Review of Systems: 10 systems reviewed and negative except per interval history/subjective  Physical Exam: Vitals:   07/30/20 0900 07/30/20 1000  BP: (!) 116/57 124/61  Pulse: 80 82  Resp: 19 (!) 21  Temp:    SpO2: 95% 92%   Total I/O In: 95.1 [I.V.:95.1] Out: 16 [Urine:16]  Intake/Output Summary (Last 24 hours) at 07/30/2020 1024 Last data filed at 07/30/2020 1000 Gross per 24 hour  Intake 1226.26 ml  Output 371 ml  Net 855.26 ml   General: Ill-appearing, lying in bed, no distress HEENT: anicteric sclera, oropharynx clear without lesions CV: Normal rate, trace edema in the lower extremities bilaterally Lungs:  Bilateral chest rise, mild increased work of breathing Abd: soft, non-tender Skin: no visible lesions or rashes Psych: alert, not engaged, mood and affect seem appropriate Musculoskeletal: no obvious deformities Neuro: normal speech, intermittent jerking of head and arms   Test Results I personally reviewed new and old clinical labs and radiology tests Lab Results  Component Value Date   NA 140 07/30/2020   K 3.9 07/30/2020   CL 101 07/30/2020   CO2 26 07/30/2020   BUN 66 (H) 07/30/2020   CREATININE 7.31 (H) 07/30/2020   CALCIUM 6.9 (L) 07/30/2020   ALBUMIN 3.0 (L) 07/30/2020   PHOS 5.5 (H) 07/30/2020

## 2020-07-30 NOTE — Progress Notes (Addendum)
NAME:  Todd Lloyd, MRN:  [id number], DOB:  [birth year], LOS: 4 ADMISSION DATE:  07/26/2020, CONSULTATION DATE:  07/26/2020 REFERRING MD:  Dr. Eulis Foster , CHIEF COMPLAINT:  Encephalopathy, Hypotension    Brief History  80 yo PMH COPD, NSCLC s/p SBRT, admitted 07/26/20 with AMS after fall.  Has had diarrhea-- got dizzy when he got up to use restroom, slipped, and remained down.  AKI, rhabdo, sepsis, shock (septic + hypovolemia), hypercarbic and hypoxic resp failure on BiPAP.  Past Medical History:  COPD (followed by Dr. 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   Significant Hospital Events:  3/7 > Presents to ED 3/8> Neuro consulted for AMS. improving mentation with metabolic correction, continues on BiPAP. Incr Bicarb gtt   Consults:  PCCM Neuro  Procedures:    Significant Diagnostic Tests:  CT Head 3/7 > 1. No evidence of acute intracranial abnormality. 2. Left frontal scalp contusion without acute fracture. CT C-Spine 3/7 > 1. No evidence of acute fracture. 2. Rotation of C1 on C2 is likely positional in the absence of a fixed torticollis. 3. Bulky facet hypertrophy at multiple levels, greatest on the left at C4-C5 where there is likely at least moderate bony foraminal stenosis. 4. Left eccentric degenerative disc disease in the lower cervical spine, greatest at C7-T1. CT a/p 3/7> focal colitis without evidence of perf. Non obstructing L renal stone. Calcified L adrenal hematoma. Stable RLL mass lesion, associated R effusion CXR 3/7 > Cardiac shadow is mildly enlarged but stable. Aortic calcifications are seen. Known right lower lobe mass lesion is not well appreciated on this exam. No infiltrate or sizable effusion is seen. No acute bony abnormality is noted. EEG 3/8>>study is suggestive of moderate  diffuse encephalopathy, nonspecific etiology. No seizures or epileptiform discharges were seen throughout the recording TTE limited 3/8 >> 1. Left ventricular ejection fraction, by estimation, is 55 to 60%. The left ventricle has normal function. Left ventricular diastolic function could not be evaluated.  2. The mitral valve is grossly normal. No evidence of mitral stenosis.  3. Tricuspid valve regurgitation is mild to moderate.  4. The aortic valve is grossly normal. Aortic valve regurgitation is trivial. No aortic stenosis is present.  5. There is moderately elevated pulmonary artery systolic pressure.  Renal u/s 3/10>> no hydro  Micro Data:  3/7 BCx  >> 3/7 UCx >>negative  3/7 COVID/ flu >> Negative  3/7 CDiff >> neg 3/8 GI PCR >> Rotavirus A/ enteropathogenic E. Coli  3/8 MRSA >> neg  Antimicrobials:  3/7 Acyclovir > 3/8 3/7 Ampicillin > 3/8 3/7 Rocephin meningeal coverage (reduced dose 3/8) > 3/9 3/7 Vancomycin > 3/8 3/7 Flagyl > 3/8 3/9 azithro, one time dose oral for enteropathogenic E. Coli   Interim History / Subjective:  Minimal UOP yesterday. Minimal wheezing, diarrhea is better. He wants to go home. Has not been OOB yet.   Objective   Blood pressure 108/63, pulse 83, temperature 98.1 F (36.7 C), temperature source Oral, resp. rate (!) 22, height 6' (1.829 m), weight 105.5 kg, SpO2 96 %.    FiO2 (%):  [40 %] 40 %   Intake/Output Summary (Last 24 hours) at 07/30/2020 0724 Last data filed at 07/30/2020 0500 Gross per 24 hour  Intake 1500.23 ml  Output 355 ml  Net 1145.23 ml  Filed Weights   07/27/20 0155 07/28/20 2000 07/30/20 0332  Weight: 97.3 kg 104.9 kg 105.5 kg    Examination: General: elderly man sitting up in bed in NAD HEENT: [place]/AT, eyes anicteric, oral mucosa moist. Neuro: awake and alert, answering questions appropriately but forgetful, moving all extremities. CV: S1S2, irreg rhythm, reg rate PULM:  Breathing comfortably on 7L O2 saturating  93%, CTAB.  No tachypnea or conversational dyspnea.   GI: Distended, nontender Extremities: warm/dry, mild symmetric LE pretibial edema distally  Resolved Hospital Problem list   Septic shock  Assessment & Plan:   Acute respiratory failure with hypoxia and hypercarbia - improving  COPD with acute exacerbation Stg 1 R Lung Ca s/p radiation -follows with Dr. Lamonte Sakai  P -Continue BiPAP at night and as needed -Wean supplemental oxygen to maintain SpO2 greater than 88% -Continue nebulizers-Pulmicort twice daily, DuoNebs  Acute metabolic encephalopathy - improving. Neuro saw and signed off, EEG neg   Hx dementia  -septic shock, AKI, hypercarbia Plan -Since he had improved throughout the admission, MRI has been deferred -Continue home Namenda and Aricept   Sepsis due to Rotavirus and enteropathogenic E. Coli; shock resolved. -Previous ECHO 02/2020 with EF 60-65, LV normal function  -with AMS, was started on empiric meningitis coverage. Stopped 3/8  - pt denies any sick contacts 3/9 Plan -As oral intake improves can discontinue maintenance fluids -Continue to monitor off vasopressors - s/p azithro x 1 orally for enteropathogenic E. Coli -Continue to follow blood cultures until finalized  Rotavirus and enteropathogenic E. Coli - no contact precautions required per infectious control - s/p azithro orally x 1 for enteropathogenic E. Coli -Ongoing volume resuscitation; encouraging oral intake - loperamide prn   AKI Rhabdomyolysis, rising CK NGMA -fell on way to bathroom, remained on ground for extended period  Plan  - CK down trending 12400-> 9700 -Continue to monitor renal function and urine output closely - continue foley for now for strict I's/O - Renal following - no urgent indication for HD but may ultimately need - per family would be open to short term HD  -Avoid nephrotoxic meds, renally dose meds   Hyperkalemia, resolved Hyperphosphatemia Hypocalcemia P -Continue  to monitor electrolytes  Paroxysmal Afib, rate controlled Plan -Continue holding digoxin until renal function is improved -Transition to low-dose Eliquis; qualifies for low-dose with age and renal function -Continue to monitor on telemetry -Optimize electrolytes  Thrombocytopenia-likely due to sepsis -Continue to monitor -Low threshold to discontinue anticoagulation if signs of bleeding.  GERD -con't PPI; on PTA omeprazole  Deconditioning -Continue working with PT, OT  Acute on chronic anemia likely due to acute and chronic illness, renal dysfunction -Transfuse for hemoglobin less than 7 or hemodynamically significant bleeding -Continue to monitor  Stable to step down to progressive care unit today.  Best practice (evaluated daily)  Diet: regular diet DVT prophylaxis: Low-dose Eliquis GI prophylaxis: PPI Glucose control: CBG  Mobility: Bedrest Disposition: SD to progressive care  Goals of Care:  Last date of multidisciplinary goals of care discussion: CCM NP spoke with family overnight on admission 3/8 -- daughter is retired Tree surgeon and would like to be included in discussions RE dialysis or intubation should these interventions become recommended.  Family and staff present:  Summary of discussion:  Follow up goals of care discussion due: 3/13 Code Status: Full Code    Labs   CBC: Recent Labs  Lab 07/26/20 1727 07/26/20 1748 07/27/20 0136 07/27/20 0254 07/27/20 0301 07/27/20 9798 07/28/20 0103 07/29/20 9211  07/30/20 0135  WBC 21.2*  --  25.8*  --   --   --  16.0* 8.5 6.1  HGB 9.8*   < > 9.7*   < > 10.9* 10.2* 9.1* 8.3* 8.2*  HCT 34.9*   < > 33.8*   < > 32.0* 30.0* 30.4* 27.6* 27.0*  MCV 95.4  --  94.9  --   --   --  91.0 90.8 90.9  PLT PLATELET CLUMPS NOTED ON SMEAR, UNABLE TO ESTIMATE  --  116*  --   --   --  86* 75* 84*   < > = values in this interval not displayed.    Basic Metabolic Panel: Recent Labs  Lab 07/27/20 0136 07/27/20 0254 07/27/20 1457  07/28/20 0103 07/29/20 0108 07/29/20 2139 07/30/20 0135  NA 139   < > 141 143 143 141 140  K 5.4*   < > 4.6 4.0 4.2 4.7 3.9  CL 107  --  104 105 100 104 101  CO2 19*  --  25 23 25 23 26   GLUCOSE 105*  --  114* 99 69* 82 86  BUN 52*  --  59* 58* 61* 66* 66*  CREATININE 3.89*  --  4.71* 5.23* 6.45* 7.12* 7.31*  CALCIUM 7.2*  --  7.6* 7.0* 7.0* 6.9* 6.9*  MG 1.9  --   --   --  1.5* 1.9 2.0  PHOS 7.5*  --   --   --  5.3*  --  5.5*   < > = values in this interval not displayed.   GFR: Estimated Creatinine Clearance: 10.1 mL/min (A) (by C-G formula based on SCr of 7.31 mg/dL (H)). Recent Labs  Lab 07/26/20 1727 07/26/20 2042 07/26/20 2206 07/27/20 0136 07/28/20 0103 07/29/20 0108 07/30/20 0135  PROCALCITON  --  26.97  --  39.52 61.41  --   --   WBC 21.2*  --   --  25.8* 16.0* 8.5 6.1  LATICACIDVEN 1.3  --  1.3  --   --   --   --     Liver Function Tests: Recent Labs  Lab 07/26/20 1727 07/27/20 0136 07/28/20 0103 07/29/20 0108 07/30/20 0135  AST 131* 231* 271* 233*  --   ALT 67* 86* 117* 126*  --   ALKPHOS 46 42 38 38  --   BILITOT 1.0 0.8 0.6 0.7  --   PROT 5.6* 5.7* 5.4* 5.1*  --   ALBUMIN 3.1* 3.4* 3.1* 2.8*   2.8* 3.0*   No results for input(s): LIPASE, AMYLASE in the last 168 hours. No results for input(s): AMMONIA in the last 168 hours.  ABG    Component Value Date/Time   PHART 7.273 (L) 07/27/2020 0950   PCO2ART 51.9 (H) 07/27/2020 0950   PO2ART 134 (H) 07/27/2020 0950   HCO3 24.1 07/27/2020 0950   TCO2 26 07/27/2020 0950   ACIDBASEDEF 3.0 (H) 07/27/2020 0950   O2SAT 99.0 07/27/2020 0950     Coagulation Profile: Recent Labs  Lab 07/26/20 1802  INR 1.7*    Cardiac Enzymes: Recent Labs  Lab 07/26/20 1727 07/26/20 2059 07/27/20 0136 07/27/20 1120 07/28/20 0103  CKTOTAL 4,945* 7,306* 11,849*   10,573* 12,472* 9,746*    HbA1C: No results found for: HGBA1C  CBG: Recent Labs  Lab 07/29/20 1514 07/29/20 1911 07/29/20 2301 07/30/20 0309  07/30/20 0710  GLUCAP 95 86 98 83 76    Julian Hy, DO 07/30/20 8:11 AM [facility]  From 7AM- 7PM if no response to pager, please call [phone]. After hours, 7PM- 7AM, please call Elink  [phone].

## 2020-07-30 NOTE — Progress Notes (Signed)
[facility] for Heparin Indication: atrial fibrillation while apixaban on hold  Allergies  Allergen Reactions  . Lyrica [Pregabalin]     confusion  . Morphine And Related     confusion    Patient Measurements: Height: 6' (182.9 cm) Weight: 105.5 kg (232 lb 9.4 oz) IBW/kg (Calculated) : 77.6 Heparin Dosing Weight: 97.1 kg   Vital Signs: Temp: 98.7 F (37.1 C) (03/11 0330) Temp Source: Axillary (03/11 0330) BP: 121/108 (03/11 0600) Pulse Rate: 86 (03/11 0600)  Labs: Recent Labs    07/27/20 1120 07/27/20 1457 07/27/20 2254 07/28/20 0103 07/28/20 0909 07/28/20 1931 07/29/20 0108 07/29/20 2139 07/30/20 0135  HGB  --   --   --  9.1*  --   --  8.3*  --  8.2*  HCT  --   --   --  30.4*  --   --  27.6*  --  27.0*  PLT  --   --   --  86*  --   --  75*  --  84*  APTT  --   --  120*  --    < > 87* 73*  --  59*  HEPARINUNFRC  --   --  >2.20*  --   --   --  1.04*  --  0.50  CREATININE  --    < >  --  5.23*  --   --  6.45* 7.12* 7.31*  CKTOTAL 12,472*  --   --  9,746*  --   --   --   --   --    < > = values in this interval not displayed.    Estimated Creatinine Clearance: 10.1 mL/min (A) (by C-G formula based on SCr of 7.31 mg/dL (H)).   Assessment: 80 yo male presented on 07/27/2020 with AMS after fall. Patient is on apixaban prior to admission for Afib which was held for possible LP. Now menigitis unlikely and no plans for LP. Pharmacy consulted to dose heparin. Last dose apixaban 3/7 PM. aPTT 35 on admission. Patient also in rhabdomyolysis with Scr rising. CT head negative for hemorrhage.   APTT slightly subtherapeutic at 59 sec today. Heparin level not yet correlated to aPTT level. No bleeding and level drawn appropriately per RN.   Goal of Therapy:  Heparin level 0.3-0.7 units/ml aPTT 66-102 seconds Monitor platelets by anticoagulation protocol: Yes   Plan:  Increase heparin gtt to 1200 units/hr.  Follow up repeat 8 hour aPTT and  heparin level  Heparin and aPTT levels not yet correlated, continue to dose using aPTT and reevaluate at next HL/aPTT   [place], Class of 2022 07/30/2020, 6:27 AM

## 2020-07-30 NOTE — Progress Notes (Signed)
Pt.'s daughter [name] is requesting for case management to contact her as soon as available. She can be reached @ [phone].

## 2020-07-31 ENCOUNTER — Other Ambulatory Visit: Payer: Self-pay

## 2020-07-31 DIAGNOSIS — R6521 Severe sepsis with septic shock: Secondary | ICD-10-CM

## 2020-07-31 DIAGNOSIS — A419 Sepsis, unspecified organism: Secondary | ICD-10-CM | POA: Diagnosis not present

## 2020-07-31 DIAGNOSIS — J9601 Acute respiratory failure with hypoxia: Secondary | ICD-10-CM | POA: Diagnosis not present

## 2020-07-31 DIAGNOSIS — N179 Acute kidney failure, unspecified: Secondary | ICD-10-CM | POA: Diagnosis not present

## 2020-07-31 DIAGNOSIS — I480 Paroxysmal atrial fibrillation: Secondary | ICD-10-CM | POA: Diagnosis not present

## 2020-07-31 LAB — RENAL FUNCTION PANEL
Albumin: 2.8 g/dL — ABNORMAL LOW (ref 3.5–5.0)
Anion gap: 12 (ref 5–15)
BUN: 72 mg/dL — ABNORMAL HIGH (ref 8–23)
CO2: 27 mmol/L (ref 22–32)
Calcium: 7.3 mg/dL — ABNORMAL LOW (ref 8.9–10.3)
Chloride: 101 mmol/L (ref 98–111)
Creatinine, Ser: 8.07 mg/dL — ABNORMAL HIGH (ref 0.61–1.24)
GFR, Estimated: 6 mL/min — ABNORMAL LOW (ref >60)
Glucose, Bld: 116 mg/dL — ABNORMAL HIGH (ref 70–99)
Phosphorus: 6 mg/dL — ABNORMAL HIGH (ref 2.5–4.6)
Potassium: 4 mmol/L (ref 3.5–5.1)
Sodium: 140 mmol/L (ref 135–145)

## 2020-07-31 LAB — BASIC METABOLIC PANEL
Anion gap: 11 (ref 5–15)
BUN: 75 mg/dL — ABNORMAL HIGH (ref 8–23)
CO2: 26 mmol/L (ref 22–32)
Calcium: 7.1 mg/dL — ABNORMAL LOW (ref 8.9–10.3)
Chloride: 101 mmol/L (ref 98–111)
Creatinine, Ser: 8.11 mg/dL — ABNORMAL HIGH (ref 0.61–1.24)
GFR, Estimated: 6 mL/min — ABNORMAL LOW (ref >60)
Glucose, Bld: 120 mg/dL — ABNORMAL HIGH (ref 70–99)
Potassium: 3.7 mmol/L (ref 3.5–5.1)
Sodium: 138 mmol/L (ref 135–145)

## 2020-07-31 LAB — MAGNESIUM: Magnesium: 1.9 mg/dL (ref 1.7–2.4)

## 2020-07-31 LAB — CBC
HCT: 27.1 % — ABNORMAL LOW (ref 39.0–52.0)
Hemoglobin: 8.3 g/dL — ABNORMAL LOW (ref 13.0–17.0)
MCH: 27.9 pg (ref 26.0–34.0)
MCHC: 30.6 g/dL (ref 30.0–36.0)
MCV: 91.2 fL (ref 80.0–100.0)
Platelets: 87 10*3/uL — ABNORMAL LOW (ref 150–400)
RBC: 2.97 MIL/uL — ABNORMAL LOW (ref 4.22–5.81)
RDW: 19.3 % — ABNORMAL HIGH (ref 11.5–15.5)
WBC: 5.1 10*3/uL (ref 4.0–10.5)
nRBC: 0 % (ref 0.0–0.2)

## 2020-07-31 LAB — CULTURE, BLOOD (ROUTINE X 2)
Culture: NO GROWTH
Culture: NO GROWTH

## 2020-07-31 LAB — GLUCOSE, CAPILLARY
Glucose-Capillary: 105 mg/dL — ABNORMAL HIGH (ref 70–99)
Glucose-Capillary: 114 mg/dL — ABNORMAL HIGH (ref 70–99)
Glucose-Capillary: 114 mg/dL — ABNORMAL HIGH (ref 70–99)
Glucose-Capillary: 121 mg/dL — ABNORMAL HIGH (ref 70–99)
Glucose-Capillary: 123 mg/dL — ABNORMAL HIGH (ref 70–99)
Glucose-Capillary: 126 mg/dL — ABNORMAL HIGH (ref 70–99)

## 2020-07-31 MED ORDER — ACETAMINOPHEN 325 MG PO TABS
650.0000 mg | ORAL_TABLET | ORAL | Status: DC | PRN
  Administered 2020-07-31 – 2020-08-07 (×14): 650 mg via ORAL
  Filled 2020-07-31 (×14): qty 2

## 2020-07-31 NOTE — NC FL2 (Signed)
[place] LEVEL OF CARE SCREENING TOOL     IDENTIFICATION  Patient Name: Todd Lloyd Birthdate: 16-Dec-1940 Sex: male Admission Date (Current Location): 07/26/2020  [facility] and [place] Number:  Herbalist and Address:  [facility] [place], [place] [address] [place], [place] [address]      Provider Number: [id number]  Attending Physician Name and Address:  Aline August, MD  Relative Name and Phone Number:  [name], [phone]    Current Level of Care: Hospital Recommended Level of Care: [place] Prior Approval Number:    Date Approved/Denied:   PASRR Number: [id number]  Discharge Plan: SNF    Current Diagnoses: Patient Active Problem List   Diagnosis Date Noted  . Elevated CK 07/27/2020  . SIRS (systemic inflammatory response syndrome) ([place]) 07/26/2020  . Acute hypercapnic respiratory failure ([place]) 07/26/2020  . AKI (acute kidney injury) ([place]) 07/26/2020  . Elevated brain natriuretic peptide (BNP) level 07/26/2020  . Volume overload 07/26/2020  . Hypotension 07/26/2020  . Malignant neoplasm of lower lobe of right lung ([place]) 04/22/2020  . Leg wound, right 06/10/2019  . Abscess of right leg   . Solitary pulmonary nodule 12/16/2018  . Essential hypertension 10/21/2018  . Chronic anticoagulation 10/21/2018  . Chronic low back pain 07/16/2018  . RLS (restless legs syndrome) 10/18/2016  . Peripheral neuropathy 10/18/2016  . Memory difficulties 09/12/2016  . Gait abnormality 09/12/2016  . History of colonic polyps 01/12/2016  . Tobacco use disorder 04/29/2015  . Dyspnea on exertion 03/19/2015  . COPD (chronic obstructive pulmonary disease) ([place]) 03/19/2015  . Insomnia 11/14/2013  . Vitamin B12 deficiency 11/14/2013  . Ataxia 11/13/2013  . Dizziness 11/12/2013  . Chronic atrial fibrillation (HCC) 11/12/2013    Orientation RESPIRATION BLADDER Height & Weight     Self  O2 ([place] 3) Indwelling  catheter Weight: 232 lb 9.4 oz (105.5 kg) Height:  6' (182.9 cm)  BEHAVIORAL SYMPTOMS/MOOD NEUROLOGICAL BOWEL NUTRITION STATUS      Incontinent Diet (See DC summary)  AMBULATORY STATUS COMMUNICATION OF NEEDS Skin   Limited Assist Verbally Skin abrasions,Other (Comment) (MASD R Anus, Abrasions Bilateral Shoulder, back and arms)                       Personal Care Assistance Level of Assistance  Bathing,Feeding,Dressing Bathing Assistance: Limited assistance Feeding assistance: Independent Dressing Assistance: Limited assistance     Functional Limitations Info  Sight,Hearing,Speech Sight Info: Impaired Hearing Info: Adequate Speech Info: Adequate    SPECIAL CARE FACTORS FREQUENCY  PT (By licensed PT),OT (By licensed OT)     PT Frequency: 5x week OT Frequency: 5x week            Contractures Contractures Info: Not present    Additional Factors Info  Code Status,Allergies Code Status Info: DNR Allergies Info: Lyrica, Morphine and related           Current Medications (07/31/2020):  This is the current hospital active medication list Current Facility-Administered Medications  Medication Dose Route Frequency Provider Last Rate Last Admin  . 0.9 %  sodium chloride infusion  250 mL Intravenous Continuous Omar Person, NP 10 mL/hr at 07/30/20 1800 Infusion Verify at 07/30/20 1800  . albuterol (PROVENTIL) (2.5 MG/3ML) 0.083% nebulizer solution 2.5 mg  2.5 mg Nebulization Q4H PRN Omar Person, NP      . apixaban (ELIQUIS) tablet 2.5 mg  2.5 mg Oral BID Julian Hy, DO  2.5 mg at 07/31/20 0953  . budesonide (PULMICORT) nebulizer solution 0.5 mg  0.5 mg Nebulization BID Renee Pain, MD   0.5 mg at 07/31/20 0907  . chlorhexidine (PERIDEX) 0.12 % solution 15 mL  15 mL Mouth Rinse BID Renee Pain, MD   15 mL at 07/31/20 0953  . Chlorhexidine Gluconate Cloth 2 % PADS 6 each  6 each Topical Q0600 Collene Gobble, MD   6 each at 07/31/20 0445  .  donepezil (ARICEPT) tablet 5 mg  5 mg Oral QHS Jennelle Human B, NP   5 mg at 07/30/20 2245  . Gerhardt's butt cream   Topical QID PRN Arnell Asal, NP   Given at 07/28/20 1128  . ipratropium-albuterol (DUONEB) 0.5-2.5 (3) MG/3ML nebulizer solution 3 mL  3 mL Nebulization Q6H Renee Pain, MD   3 mL at 07/31/20 0908  . lactated ringers infusion   Intravenous Continuous Collene Gobble, MD 10 mL/hr at 07/30/20 1800 Infusion Verify at 07/30/20 1800  . loperamide (IMODIUM) capsule 2 mg  2 mg Oral PRN Jennelle Human B, NP   2 mg at 07/29/20 2206  . MEDLINE mouth rinse  15 mL Mouth Rinse q12n4p Renee Pain, MD   15 mL at 07/29/20 1200  . melatonin tablet 5 mg  5 mg Oral QHS Sloan Leiter B, RPH   5 mg at 07/29/20 2126  . memantine (NAMENDA) tablet 5 mg  5 mg Oral BID Jennelle Human B, NP   5 mg at 07/31/20 0952  . norepinephrine (LEVOPHED) 4mg  in 280mL premix infusion  2-10 mcg/min Intravenous Titrated Omar Person, NP   Stopped at 07/28/20 [phone]  . pantoprazole (PROTONIX) EC tablet 40 mg  40 mg Oral Daily Sloan Leiter B, RPH   40 mg at 07/31/20 0953  . polyethylene glycol (MIRALAX / GLYCOLAX) packet 17 g  17 g Oral Daily PRN Omar Person, NP         Discharge Medications: Please see discharge summary for a list of discharge medications.  Relevant Imaging Results:  Relevant Lab Results:   Additional Information SS# [id number]  [place], [place]

## 2020-07-31 NOTE — Progress Notes (Signed)
Nephrology Follow-Up Consult note   Assessment/Recommendations: Todd Lloyd is a/an 80 y.o. male with a past medical history significant for COPD, lung cancer status post treatment, dementia, atrial fibrillation who present w/ sepsis now with AKI  Oliguric AKI, unstable worsening: Likely secondary to ATN from shock as well as rhabdomyolysis.  Significant AKI before he even arrived to the hospital.    He has intermittently demonstrated signs of uremia.  His jerking has improved today which is reassuring.  He has previously stated he wants to hold off on dialysis as long as possible.  Given his signs of uremia are minimal at this time we can do so -Continue to hold on dialysis for now but may need to arrange for dialysis catheter on Monday if he continues to be slow to improve.  May need to engage family in conversation as well -Minimal response to FST yesterday -Continue management of shock as below -Continue to monitor daily Cr, Dose meds for GFR -Monitor Daily I/Os, Daily weight  -Maintain MAP>65 for optimal renal perfusion.  -Avoid nephrotoxic medications including NSAIDs and Vanc/Zosyn combo -Renal ultrasound without signs of obstruction -Agree with holding losartan and diuretics -Currently no IV fluids running  Acute hypoxic and hypercarbic respiratory failure: History of COPD.  Management per primary.  Supplemental oxygen as needed  Shock: Likely rotavirus and E. coli.    Overall improved.  Shock improved  Altered mental status: Overall improved but remains delirious.  Likely multifactorial  Hypertension: Blood pressure low at this time related to shock.  Holding home medications     Recommendations conveyed to primary service.    [facility] 07/31/2020 9:57 AM  ___________________________________________________________  CC: AKI  Interval History/Subjective: Patient states he is very frustrated today because he does not know  why he is in the hospital and why he cannot walk around.  He wants to leave but also has noted to be confused.  I discussed his kidney injury at length and he just continually said he does not understand what I am saying.   Medications:  Current Facility-Administered Medications  Medication Dose Route Frequency Provider Last Rate Last Admin  . 0.9 %  sodium chloride infusion  250 mL Intravenous Continuous Omar Person, NP 10 mL/hr at 07/30/20 1800 Infusion Verify at 07/30/20 1800  . albuterol (PROVENTIL) (2.5 MG/3ML) 0.083% nebulizer solution 2.5 mg  2.5 mg Nebulization Q4H PRN Omar Person, NP      . apixaban Arne [place]) tablet 2.5 mg  2.5 mg Oral BID Noemi Chapel P, DO   2.5 mg at 07/31/20 0953  . budesonide (PULMICORT) nebulizer solution 0.5 mg  0.5 mg Nebulization BID Renee Pain, MD   0.5 mg at 07/31/20 0907  . chlorhexidine (PERIDEX) 0.12 % solution 15 mL  15 mL Mouth Rinse BID Renee Pain, MD   15 mL at 07/31/20 0953  . Chlorhexidine Gluconate Cloth 2 % PADS 6 each  6 each Topical Q0600 Collene Gobble, MD   6 each at 07/31/20 0445  . donepezil (ARICEPT) tablet 5 mg  5 mg Oral QHS Jennelle Human B, NP   5 mg at 07/30/20 2245  . Gerhardt's butt cream   Topical QID PRN Arnell Asal, NP   Given at 07/28/20 1128  . ipratropium-albuterol (DUONEB) 0.5-2.5 (3) MG/3ML nebulizer solution 3 mL  3 mL Nebulization Q6H Renee Pain, MD   3 mL at 07/31/20 0908  . lactated ringers infusion   Intravenous Continuous [name]  S, MD 10 mL/hr at 07/30/20 1800 Infusion Verify at 07/30/20 1800  . loperamide (IMODIUM) capsule 2 mg  2 mg Oral PRN Jennelle Human B, NP   2 mg at 07/29/20 2206  . MEDLINE mouth rinse  15 mL Mouth Rinse q12n4p Renee Pain, MD   15 mL at 07/29/20 1200  . melatonin tablet 5 mg  5 mg Oral QHS Sloan Leiter B, RPH   5 mg at 07/29/20 2126  . memantine (NAMENDA) tablet 5 mg  5 mg Oral BID Jennelle Human B, NP   5 mg at 07/31/20 0952  .  norepinephrine (LEVOPHED) 4mg  in 251mL premix infusion  2-10 mcg/min Intravenous Titrated Omar Person, NP   Stopped at 07/28/20 [phone]  . pantoprazole (PROTONIX) EC tablet 40 mg  40 mg Oral Daily Sloan Leiter B, RPH   40 mg at 07/31/20 0953  . polyethylene glycol (MIRALAX / GLYCOLAX) packet 17 g  17 g Oral Daily PRN Omar Person, NP          Review of Systems: 10 systems reviewed and negative except per interval history/subjective  Physical Exam: Vitals:   07/31/20 0759 07/31/20 0907  BP: (!) 98/57   Pulse: 91   Resp: 13   Temp: 98.2 F (36.8 C)   SpO2: 92% 96%   Total I/O In: -  Out: 800 [Stool:800]  Intake/Output Summary (Last 24 hours) at 07/31/2020 0957 Last data filed at 07/31/2020 0805 Gross per 24 hour  Intake 440.25 ml  Output 1205 ml  Net -764.75 ml   General: Ill-appearing, lying in bed, no distress HEENT: anicteric sclera, oropharynx clear without lesions CV: Normal rate, trace edema in the lower extremities bilaterally Lungs:  Bilateral chest rise, mild increased work of breathing Abd: soft, non-tender Skin: no visible lesions or rashes Psych: alert,  engaged but slightly confused Musculoskeletal: no obvious deformities Neuro: normal speech, minimal tremor present, confused   Test Results I personally reviewed new and old clinical labs and radiology tests Lab Results  Component Value Date   NA 140 07/31/2020   K 4.0 07/31/2020   CL 101 07/31/2020   CO2 27 07/31/2020   BUN 72 (H) 07/31/2020   CREATININE 8.07 (H) 07/31/2020   CALCIUM 7.3 (L) 07/31/2020   ALBUMIN 2.8 (L) 07/31/2020   PHOS 6.0 (H) 07/31/2020

## 2020-07-31 NOTE — Progress Notes (Signed)
Notified MD of back pain, left groin pain, and 12 beats of asymptomatic vtach, orders received.

## 2020-07-31 NOTE — Progress Notes (Signed)
Patient ID: Todd Lloyd, male   DOB: Jan 03, 1941, 80 y.o.   MRN: [id number]  PROGRESS NOTE    Todd Lloyd  WIO:[id number] DOB: 1940-08-15 DOA: 07/26/2020 PCP: Haywood Pao, MD   Brief Narrative:  80 year old male with history of COPD, stage I non-small cell lung cancer status post radiation in 04/2020, dementia, paroxysmal A. fib on Eliquis, GERD, leukemia status post bone marrow transplantation in 1990 was admitted to ICU under PCCM service with altered mental status after a fall and diarrhea.  He was found to be in shock/respiratory failure requiring BiPAP, acute kidney injury, rhabdomyolysis.  CT of the head and C-spine were negative for acute abnormalities.  CT of the abdomen and pelvis showed focal colitis without evidence of perforation.  Neurology and nephrology were consulted.  He was treated with bicarb drip.  EEG was negative for seizures.  He was treated with broad-spectrum antibiotics initially; stool was positive for rotavirus and enteropathogenic E. coli for which she was given a dose of Zithromax as well.  He was treated with intravenous fluids initially.  Urine output has been minimal and he has been getting intermittent Lasix recently by nephrology.  Mental status has improved; neurology signed off.  Patient was transferred to [facility] service on 07/31/2020.  Assessment & Plan:   Septic shock: Present on admission; shock is resolved Rotavirus and enteropathogenic E. coli diarrhea -Patient was initially treated with IV fluids and broad-spectrum antibiotics.  Patient also received empiric coverage for meningitis because of altered mental status which were stopped on 07/27/2020.  Blood and urine cultures have been negative so far.  Covid/flu were negative on admission.  C. difficile testing was negative.  GI PCR was positive for rotavirus/enteropathogenic E. coli.  He received a dose of Zithromax on 07/28/2020 to cover enteropathogenic E. Coli. -Antibiotics have subsequently been  discontinued. -Blood pressure on the lower side but stable.  Off pressors. -Transferred to [facility] service on 07/31/2020.  Acute metabolic encephalopathy History of dementia -Patient was encephalopathic probably secondary to shock/respiratory failure. -EEG was negative for seizures.  Neurology has signed off.  MRI has been deferred because of improvement in mental status. -Continue Namenda and Aricept -Fall precautions -PT is recommending SNF.  Social worker following  Acute renal failure Rhabdomyolysis Acute metabolic acidosis: Acidosis has resolved -Likely from ATN from shock and rhabdomyolysis -Initially treated with IV fluids and then subsequently with intermittent Lasix.  Urine output still very poor.  Nephrology following: Might need home analysis at some point but patient would want to avoid at this time. -CK total was trending down -Renal ultrasound was negative for obstruction.  Avoid nephrotoxic medications.  Losartan and diuretics on hold for now.  Acute hypoxic respiratory failure COPD with acute exacerbation Stage I right lung cancer status post radiation -Respiratory status improving.  Currently on 3 L oxygen via nasal cannula and required BiPAP at night.  Continue BiPAP at night and as needed for now.  PCCM have signed off -Echo showed EF of 55 to 60% -Continue nebs.  Currently not on any steroids.  Paroxysmal A. Fib -Rate controlled.  Continue low-dose Eliquis.  Digoxin on hold.  Outpatient follow-up with cardiology  Leukocytosis -Resolved  Thrombocytopenia -Likely due to sepsis.  Monitor.  No signs of bleeding currently  Anemia of chronic disease -Probably from acute on chronic illnesses.  Hemoglobin stable.  Transfuse if hemoglobin is less than 7  GERD -Continue PPI    DVT prophylaxis: Eliquis Code Status: DNR Family Communication: None at  bedside Disposition Plan: Status is: Inpatient  Remains inpatient appropriate because:Inpatient level of care  appropriate due to severity of illness   Dispo: The patient is from: Home              Anticipated d/c is to: SNF              Patient currently is not medically stable to d/c.   Difficult to place patient No  Consultants: PCCM/nephrology  Procedures: Echo  Antimicrobials:  Anti-infectives (From admission, onward)   Start     Dose/Rate Route Frequency Ordered Stop   07/28/20 0845  azithromycin (ZITHROMAX) tablet 1,000 mg        1,000 mg Oral  Once 07/28/20 0747 07/28/20 0827   07/28/20 0608  cefTRIAXone (ROCEPHIN) 2 g in sodium chloride 0.9 % 100 mL IVPB  Status:  Discontinued        2 g 200 mL/hr over 30 Minutes Intravenous Every 24 hours 07/27/20 1447 07/28/20 0747   07/27/20 1045  metroNIDAZOLE (FLAGYL) IVPB 500 mg        500 mg 100 mL/hr over 60 Minutes Intravenous  Once 07/27/20 0956 07/27/20 1113   07/26/20 1930  acyclovir (ZOVIRAX) 775 mg in dextrose 5 % 150 mL IVPB  Status:  Discontinued        10 mg/kg  77.6 kg (Ideal) 165.5 mL/hr over 60 Minutes Intravenous Every 24 hours 07/26/20 1848 07/27/20 1424   07/26/20 1915  ampicillin (OMNIPEN) 2 g in sodium chloride 0.9 % 100 mL IVPB  Status:  Discontinued        2 g 300 mL/hr over 20 Minutes Intravenous Every 8 hours 07/26/20 1848 07/27/20 1424   07/26/20 1900  cefTRIAXone (ROCEPHIN) 2 g in sodium chloride 0.9 % 100 mL IVPB  Status:  Discontinued        2 g 200 mL/hr over 30 Minutes Intravenous Every 12 hours 07/26/20 1846 07/27/20 1447   07/26/20 1851  vancomycin variable dose per unstable renal function (pharmacist dosing)  Status:  Discontinued         Does not apply See admin instructions 07/26/20 1853 07/27/20 1424   07/26/20 1815  ceFEPIme (MAXIPIME) 2 g in sodium chloride 0.9 % 100 mL IVPB  Status:  Discontinued        2 g 200 mL/hr over 30 Minutes Intravenous  Once 07/26/20 1802 07/26/20 1846   07/26/20 1815  metroNIDAZOLE (FLAGYL) IVPB 500 mg        500 mg 100 mL/hr over 60 Minutes Intravenous  Once 07/26/20 1802  07/26/20 2049   07/26/20 1815  vancomycin (VANCOREADY) IVPB 1750 mg/350 mL        1,750 mg 175 mL/hr over 120 Minutes Intravenous  Once 07/26/20 1802 07/26/20 2228       Subjective: Patient seen and examined at bedside.  Awake, poor historian.  Feels weak.  No overnight fever, vomiting or chest pain reported.  Objective: Vitals:   07/31/20 0146 07/31/20 0508 07/31/20 0759 07/31/20 0907  BP:   (!) 98/57   Pulse: 93 85 91   Resp: 20 20 13    Temp:  98.8 F (37.1 C) 98.2 F (36.8 C)   TempSrc:  Axillary Oral   SpO2: 96% 100% 92% 96%  Weight:      Height:        Intake/Output Summary (Last 24 hours) at 07/31/2020 1027 Last data filed at 07/31/2020 0805 Gross per 24 hour  Intake 408.26 ml  Output 1205 ml  Net -796.74 ml   Filed Weights   07/27/20 0155 07/28/20 2000 07/30/20 0332  Weight: 97.3 kg 104.9 kg 105.5 kg    Examination:  General exam: Appears calm and comfortable.  Poor historian.  Looks chronically ill.  On 3 L nasal cannula oxygen currently Respiratory system: Bilateral decreased breath sounds at bases with scattered crackles Cardiovascular system: S1 & S2 heard, Rate controlled Gastrointestinal system: Abdomen is nondistended, soft and nontender. Normal bowel sounds heard. Extremities: No cyanosis, clubbing; trace lower extremity edema present  Central nervous system: Awake, very slow to respond to questions.  No focal neurological deficits.  Moves extremities. Skin: No rashes, lesions or ulcers Psychiatry: Flat affect    Data Reviewed: I have personally reviewed following labs and imaging studies  CBC: Recent Labs  Lab 07/27/20 0136 07/27/20 0254 07/27/20 0950 07/28/20 0103 07/29/20 0108 07/30/20 0135 07/31/20 0139  WBC 25.8*  --   --  16.0* 8.5 6.1 5.1  HGB 9.7*   < > 10.2* 9.1* 8.3* 8.2* 8.3*  HCT 33.8*   < > 30.0* 30.4* 27.6* 27.0* 27.1*  MCV 94.9  --   --  91.0 90.8 90.9 91.2  PLT 116*  --   --  86* 75* 84* 87*   < > = values in this  interval not displayed.   Basic Metabolic Panel: Recent Labs  Lab 07/27/20 0136 07/27/20 0254 07/28/20 0103 07/29/20 0108 07/29/20 2139 07/30/20 0135 07/31/20 0139  NA 139   < > 143 143 141 140 140  K 5.4*   < > 4.0 4.2 4.7 3.9 4.0  CL 107   < > 105 100 104 101 101  CO2 19*   < > 23 25 23 26 27   GLUCOSE 105*   < > 99 69* 82 86 116*  BUN 52*   < > 58* 61* 66* 66* 72*  CREATININE 3.89*   < > 5.23* 6.45* 7.12* 7.31* 8.07*  CALCIUM 7.2*   < > 7.0* 7.0* 6.9* 6.9* 7.3*  MG 1.9  --   --  1.5* 1.9 2.0  --   PHOS 7.5*  --   --  5.3*  --  5.5* 6.0*   < > = values in this interval not displayed.   GFR: Estimated Creatinine Clearance: 9.2 mL/min (A) (by C-G formula based on SCr of 8.07 mg/dL (H)). Liver Function Tests: Recent Labs  Lab 07/26/20 1727 07/27/20 0136 07/28/20 0103 07/29/20 0108 07/30/20 0135 07/31/20 0139  AST 131* 231* 271* 233*  --   --   ALT 67* 86* 117* 126*  --   --   ALKPHOS 46 42 38 38  --   --   BILITOT 1.0 0.8 0.6 0.7  --   --   PROT 5.6* 5.7* 5.4* 5.1*  --   --   ALBUMIN 3.1* 3.4* 3.1* 2.8*  2.8* 3.0* 2.8*   No results for input(s): LIPASE, AMYLASE in the last 168 hours. No results for input(s): AMMONIA in the last 168 hours. Coagulation Profile: Recent Labs  Lab 07/26/20 1802  INR 1.7*   Cardiac Enzymes: Recent Labs  Lab 07/26/20 1727 07/26/20 2059 07/27/20 0136 07/27/20 1120 07/28/20 0103  CKTOTAL 4,945* 7,306* 11,849*  10,573* 12,472* 9,746*   BNP (last 3 results) No results for input(s): PROBNP in the last 8760 hours. HbA1C: No results for input(s): HGBA1C in the last 72 hours. CBG: Recent Labs  Lab 07/30/20 1527 07/30/20 2038 07/30/20 2345 07/31/20 0505 07/31/20 0758  GLUCAP 102*  110* 112* 105* 121*   Lipid Profile: No results for input(s): CHOL, HDL, LDLCALC, TRIG, CHOLHDL, LDLDIRECT in the last 72 hours. Thyroid Function Tests: No results for input(s): TSH, T4TOTAL, FREET4, T3FREE, THYROIDAB in the last 72 hours. Anemia  Panel: No results for input(s): VITAMINB12, FOLATE, FERRITIN, TIBC, IRON, RETICCTPCT in the last 72 hours. Sepsis Labs: Recent Labs  Lab 07/26/20 1727 07/26/20 2042 07/26/20 2206 07/27/20 0136 07/28/20 0103  PROCALCITON  --  26.97  --  39.52 61.41  LATICACIDVEN 1.3  --  1.3  --   --     Recent Results (from the past 240 hour(s))  Resp Panel by RT-PCR (Flu A&B, Covid) Nasopharyngeal Swab     Status: None   Collection Time: 07/26/20  5:27 PM   Specimen: Nasopharyngeal Swab; Nasopharyngeal(NP) swabs in vial transport medium  Result Value Ref Range Status   SARS Coronavirus 2 by RT PCR NEGATIVE NEGATIVE Final    Comment: (NOTE) SARS-CoV-2 target nucleic acids are NOT DETECTED.  The SARS-CoV-2 RNA is generally detectable in upper respiratory specimens during the acute phase of infection. The lowest concentration of SARS-CoV-2 viral copies this assay can detect is 138 copies/mL. A negative result does not preclude SARS-Cov-2 infection and should not be used as the sole basis for treatment or other patient management decisions. A negative result may occur with  improper specimen collection/handling, submission of specimen other than nasopharyngeal swab, presence of viral mutation(s) within the areas targeted by this assay, and inadequate number of viral copies(<138 copies/mL). A negative result must be combined with clinical observations, patient history, and epidemiological information. The expected result is Negative.  Fact Sheet for Patients:  EntrepreneurPulse.com.au  Fact Sheet for Healthcare Providers:  IncredibleEmployment.be  This test is no t yet approved or cleared by the [place] FDA and  has been authorized for detection and/or diagnosis of SARS-CoV-2 by FDA under an Emergency Use Authorization (EUA). This EUA will remain  in effect (meaning this test can be used) for the duration of the COVID-19 declaration under Section  564(b)(1) of the Act, 21 U.S.C.section 360bbb-3(b)(1), unless the authorization is terminated  or revoked sooner.       Influenza A by PCR NEGATIVE NEGATIVE Final   Influenza B by PCR NEGATIVE NEGATIVE Final    Comment: (NOTE) The Xpert Xpress SARS-CoV-2/FLU/RSV plus assay is intended as an aid in the diagnosis of influenza from Nasopharyngeal swab specimens and should not be used as a sole basis for treatment. Nasal washings and aspirates are unacceptable for Xpert Xpress SARS-CoV-2/FLU/RSV testing.  Fact Sheet for Patients: EntrepreneurPulse.com.au  Fact Sheet for Healthcare Providers: IncredibleEmployment.be  This test is not yet approved or cleared by the [place] FDA and has been authorized for detection and/or diagnosis of SARS-CoV-2 by FDA under an Emergency Use Authorization (EUA). This EUA will remain in effect (meaning this test can be used) for the duration of the COVID-19 declaration under Section 564(b)(1) of the Act, 21 U.S.C. section 360bbb-3(b)(1), unless the authorization is terminated or revoked.  Performed at [facility], [place] [address], [place], [place] [address]   Blood Culture (routine x 2)     Status: None (Preliminary result)   Collection Time: 07/26/20  5:44 PM   Specimen: BLOOD LEFT ARM  Result Value Ref Range Status   Specimen Description BLOOD LEFT ARM  Final   Special Requests   Final    BOTTLES DRAWN AEROBIC AND ANAEROBIC Blood Culture results may not be optimal due  to an inadequate volume of blood received in culture bottles   Culture   Final    NO GROWTH 4 DAYS Performed at [facility], [place] [address] [place], [place] [address]    Report Status PENDING  Incomplete  Blood Culture (routine x 2)     Status: None (Preliminary result)   Collection Time: 07/26/20  6:25 PM   Specimen: BLOOD LEFT ARM  Result Value Ref Range Status   Specimen Description BLOOD LEFT ARM  Final   Special  Requests   Final    AEROBIC BOTTLE ONLY Blood Culture results may not be optimal due to an inadequate volume of blood received in culture bottles   Culture   Final    NO GROWTH 4 DAYS Performed at [facility], [place] [address] [place], [place] [address]    Report Status PENDING  Incomplete  C Difficile Quick Screen w PCR reflex     Status: None   Collection Time: 07/26/20  8:54 PM   Specimen: STOOL  Result Value Ref Range Status   C Diff antigen NEGATIVE NEGATIVE Final   C Diff toxin NEGATIVE NEGATIVE Final   C Diff interpretation No C. difficile detected.  Final    Comment: Performed at [facility], [place] [address], [place], [place] [address]  Urine culture     Status: None   Collection Time: 07/26/20  9:16 PM   Specimen: In/Out Cath Urine  Result Value Ref Range Status   Specimen Description IN/OUT CATH URINE  Final   Special Requests NONE  Final   Culture   Final    NO GROWTH Performed at [facility], [place] [address] [place], [place] [address]    Report Status 07/28/2020 FINAL  Final  Gastrointestinal Panel by PCR , Stool     Status: Abnormal   Collection Time: 07/27/20  3:06 AM   Specimen: Nasal Mucosa; Stool  Result Value Ref Range Status   Campylobacter species NOT DETECTED NOT DETECTED Final   Plesimonas shigelloides NOT DETECTED NOT DETECTED Final   Salmonella species NOT DETECTED NOT DETECTED Final   Yersinia enterocolitica NOT DETECTED NOT DETECTED Final   Vibrio species NOT DETECTED NOT DETECTED Final   Vibrio cholerae NOT DETECTED NOT DETECTED Final   Enteroaggregative E coli (EAEC) NOT DETECTED NOT DETECTED Final   Enteropathogenic E coli (EPEC) DETECTED (A) NOT DETECTED Final    Comment: RESULT CALLED TO, READ BACK BY AND VERIFIED WITH: BRANDY TURNER 07/27/20 1708 KLW    Enterotoxigenic E coli (ETEC) NOT DETECTED NOT DETECTED Final   Shiga like toxin producing E coli (STEC) NOT DETECTED NOT DETECTED Final   Shigella/Enteroinvasive E coli  (EIEC) NOT DETECTED NOT DETECTED Final   Cryptosporidium NOT DETECTED NOT DETECTED Final   Cyclospora cayetanensis NOT DETECTED NOT DETECTED Final   Entamoeba histolytica NOT DETECTED NOT DETECTED Final   Giardia lamblia NOT DETECTED NOT DETECTED Final   Adenovirus F40/41 NOT DETECTED NOT DETECTED Final   Astrovirus NOT DETECTED NOT DETECTED Final   Norovirus GI/GII NOT DETECTED NOT DETECTED Final   Rotavirus A DETECTED (A) NOT DETECTED Final    Comment: RESULT CALLED TO, READ BACK BY AND VERIFIED WITH: BRANDY TURNER 07/27/20 1708 KLW    Sapovirus (I, II, IV, and V) NOT DETECTED NOT DETECTED Final    Comment: Performed at [facility], [address] [place], [place] [address]  MRSA PCR Screening     Status: None  Collection Time: 07/27/20  7:21 PM   Specimen: Nasal Mucosa; Nasopharyngeal  Result Value Ref Range Status   MRSA by PCR NEGATIVE NEGATIVE Final    Comment:        The GeneXpert MRSA Assay (FDA approved for NASAL specimens only), is one component of a comprehensive MRSA colonization surveillance program. It is not intended to diagnose MRSA infection nor to guide or monitor treatment for MRSA infections. Performed at [facility], [place] [address], [place], [place] [address]          Radiology Studies: DG Chest Port 1 View  Result Date: 07/30/2020 CLINICAL DATA:  Respiratory failure EXAM: PORTABLE CHEST 1 VIEW COMPARISON:  07/26/2020 FINDINGS: The lungs are symmetrically well inflated. Minimal left basilar atelectasis. Mild coarsening of the pulmonary interstitium is again noted. No pneumothorax or pleural effusion. Cardiac size is mildly enlarged. Pulmonary vasculature is normal. No acute bone abnormality. IMPRESSION: No active disease.  Stable cardiomegaly. Electronically Signed   By: Fidela Salisbury MD   On: 07/30/2020 07:04        Scheduled Meds: . apixaban  2.5 mg Oral BID  . budesonide (PULMICORT) nebulizer solution  0.5 mg Nebulization BID   . chlorhexidine  15 mL Mouth Rinse BID  . Chlorhexidine Gluconate Cloth  6 each Topical Q0600  . donepezil  5 mg Oral QHS  . ipratropium-albuterol  3 mL Nebulization Q6H  . mouth rinse  15 mL Mouth Rinse q12n4p  . melatonin  5 mg Oral QHS  . memantine  5 mg Oral BID  . pantoprazole  40 mg Oral Daily   Continuous Infusions: . sodium chloride 10 mL/hr at 07/30/20 1800  . lactated ringers 10 mL/hr at 07/30/20 1800  . norepinephrine (LEVOPHED) Adult infusion Stopped (07/28/20 0955)          Aline August, MD [facility] 07/31/2020, 10:27 AM

## 2020-07-31 NOTE — TOC Initial Note (Signed)
Transition of Care [facility]) - Initial/Assessment Note    Patient Details  Name: Todd Lloyd MRN: [id number] Date of Birth: 1940/08/25  [facility]) CM/SW Contact:    Coralee Pesa, [place] Phone Number: 07/31/2020, 10:42 AM  Clinical Narrative:                 CSW noted that pt is disoriented x3 and that dtr is requesting to talk with TOC. CSW spoke with dtr, [name] who noted that pt had been independent and caring for his wife at home. His wife just had a stroke and is requiring assistance as well. She noted that she would be interested in a longer term placement for both of her parents, possibly to ALF. CSW discussed payment limitations of Medicare, beyond SNF placement. Resources and information were given on Medicaid applications and long term care options. She also noted that pt is associated with the [place], but unsure of benefits beyond medications. CSW unable to check on the weekend. Pt's dtr noted no preference, but would prefer to stay around [place]. Pt has been fully vaccinated and boosted. Medicare list given to dtr. She noted she would look into after SNF options this weekend and SW will follow up with any bed offers. SW will continue to follow for transition of care.  Expected Discharge Plan: Skilled Nursing Facility Barriers to Discharge: SNF Pending bed offer,Continued Medical Work up   Patient Goals and CMS Choice Patient states their goals for this hospitalization and ongoing recovery are:: Pt unable to participate in goal setting due to disorientation. CMS Medicare.gov Compare Post Acute Care list provided to:: Patient Represenative (must comment) [name], daughter)    Expected Discharge Plan and Services Expected Discharge Plan: [place] Acute Care Choice: [place] Living arrangements for the past 2 months: Single Family Home                                      Prior Living  Arrangements/Services Living arrangements for the past 2 months: Single Family Home Lives with:: Spouse Patient language and need for interpreter reviewed:: Yes Do you feel safe going back to the place where you live?: Yes      Need for Family Participation in Patient Care: Yes (Comment) Care giver support system in place?: No (comment)   Criminal Activity/Legal Involvement Pertinent to Current Situation/Hospitalization: No - Comment as needed  Activities of Daily Living   ADL Screening (condition at time of admission) Patient's cognitive ability adequate to safely complete daily activities?: No Is the patient deaf or have difficulty hearing?: No Does the patient have difficulty seeing, even when wearing glasses/contacts?: No Does the patient have difficulty concentrating, remembering, or making decisions?: Yes Patient able to express need for assistance with ADLs?: No Does the patient have difficulty dressing or bathing?: Yes Independently performs ADLs?: No Communication: Independent Dressing (OT): Needs assistance Is this a change from baseline?: Change from baseline, expected to last >3 days Grooming: Needs assistance Is this a change from baseline?: Change from baseline, expected to last >3 days Feeding: Needs assistance Is this a change from baseline?: Change from baseline, expected to last >3 days Bathing: Needs assistance Is this a change from baseline?: Change from baseline, expected to last >3 days Toileting: Needs assistance Is this a change from baseline?: Change from baseline, expected to last >3days In/Out Bed: Needs  assistance Is this a change from baseline?: Change from baseline, expected to last >3 days Walks in Home: Needs assistance Is this a change from baseline?: Change from baseline, expected to last >3 days Does the patient have difficulty walking or climbing stairs?: Yes Weakness of Legs: Both Weakness of Arms/Hands: Both  Permission  Sought/Granted Permission sought to share information with : Family Supports Permission granted to share information with : Yes, Verbal Permission Granted  Share Information with NAME: [name]     Permission granted to share info w Relationship: Daughter  Permission granted to share info w Contact Information: [phone]  Emotional Assessment Appearance:: Appears stated age Attitude/Demeanor/Rapport: Unable to Assess Affect (typically observed): Unable to Assess Orientation: : Oriented to Self Alcohol / Substance Use: Not Applicable Psych Involvement: No (comment)  Admission diagnosis:  Respiratory distress [R06.03] Fall [W19.XXXA] Encephalopathy [G93.40] AKI (acute kidney injury) ([place]) [N17.9] Injury of head, initial encounter [M09.47SJ] Fall, initial encounter [W19.XXXA] Sepsis ([place]) [A41.9] Altered mental status, unspecified altered mental status type [R41.82] Sepsis, due to unspecified organism, unspecified whether acute organ dysfunction present [facility]) [A41.9] Patient Active Problem List   Diagnosis Date Noted  . Elevated CK 07/27/2020  . SIRS (systemic inflammatory response syndrome) ([place]) 07/26/2020  . Acute hypercapnic respiratory failure ([place]) 07/26/2020  . AKI (acute kidney injury) ([place]) 07/26/2020  . Elevated brain natriuretic peptide (BNP) level 07/26/2020  . Volume overload 07/26/2020  . Hypotension 07/26/2020  . Malignant neoplasm of lower lobe of right lung ([place]) 04/22/2020  . Leg wound, right 06/10/2019  . Abscess of right leg   . Solitary pulmonary nodule 12/16/2018  . Essential hypertension 10/21/2018  . Chronic anticoagulation 10/21/2018  . Chronic low back pain 07/16/2018  . RLS (restless legs syndrome) 10/18/2016  . Peripheral neuropathy 10/18/2016  . Memory difficulties 09/12/2016  . Gait abnormality 09/12/2016  . History of colonic polyps 01/12/2016  . Tobacco use disorder 04/29/2015  . Dyspnea on exertion 03/19/2015  . COPD (chronic  obstructive pulmonary disease) ([place]) 03/19/2015  . Insomnia 11/14/2013  . Vitamin B12 deficiency 11/14/2013  . Ataxia 11/13/2013  . Dizziness 11/12/2013  . Chronic atrial fibrillation ([place]) 11/12/2013   PCP:  Haywood Pao, MD Pharmacy:   [id number] - [place], [address] [place] [address] Phone: [phone] Fax: [place], [place] - [address] [place] [address] [place] [address] Phone: [phone] Fax: [phone]  [place], [place] [address] [place] [address] Phone: [phone] Fax: [phone]     Social Determinants of Health (SDOH) Interventions    Readmission Risk Interventions No flowsheet data found.

## 2020-08-01 DIAGNOSIS — I959 Hypotension, unspecified: Secondary | ICD-10-CM | POA: Diagnosis not present

## 2020-08-01 DIAGNOSIS — N179 Acute kidney failure, unspecified: Secondary | ICD-10-CM | POA: Diagnosis not present

## 2020-08-01 DIAGNOSIS — I482 Chronic atrial fibrillation, unspecified: Secondary | ICD-10-CM | POA: Diagnosis not present

## 2020-08-01 DIAGNOSIS — J9602 Acute respiratory failure with hypercapnia: Secondary | ICD-10-CM | POA: Diagnosis not present

## 2020-08-01 LAB — CBC
HCT: 25.8 % — ABNORMAL LOW (ref 39.0–52.0)
Hemoglobin: 8 g/dL — ABNORMAL LOW (ref 13.0–17.0)
MCH: 27.8 pg (ref 26.0–34.0)
MCHC: 31 g/dL (ref 30.0–36.0)
MCV: 89.6 fL (ref 80.0–100.0)
Platelets: 102 10*3/uL — ABNORMAL LOW (ref 150–400)
RBC: 2.88 MIL/uL — ABNORMAL LOW (ref 4.22–5.81)
RDW: 19.4 % — ABNORMAL HIGH (ref 11.5–15.5)
WBC: 4.5 10*3/uL (ref 4.0–10.5)
nRBC: 0 % (ref 0.0–0.2)

## 2020-08-01 LAB — RENAL FUNCTION PANEL
Albumin: 2.5 g/dL — ABNORMAL LOW (ref 3.5–5.0)
Anion gap: 14 (ref 5–15)
BUN: 78 mg/dL — ABNORMAL HIGH (ref 8–23)
CO2: 23 mmol/L (ref 22–32)
Calcium: 7.2 mg/dL — ABNORMAL LOW (ref 8.9–10.3)
Chloride: 102 mmol/L (ref 98–111)
Creatinine, Ser: 8.44 mg/dL — ABNORMAL HIGH (ref 0.61–1.24)
GFR, Estimated: 6 mL/min — ABNORMAL LOW (ref >60)
Glucose, Bld: 102 mg/dL — ABNORMAL HIGH (ref 70–99)
Phosphorus: 6.1 mg/dL — ABNORMAL HIGH (ref 2.5–4.6)
Potassium: 3.7 mmol/L (ref 3.5–5.1)
Sodium: 139 mmol/L (ref 135–145)

## 2020-08-01 LAB — GLUCOSE, CAPILLARY
Glucose-Capillary: 100 mg/dL — ABNORMAL HIGH (ref 70–99)
Glucose-Capillary: 91 mg/dL (ref 70–99)
Glucose-Capillary: 108 mg/dL — ABNORMAL HIGH (ref 70–99)
Glucose-Capillary: 102 mg/dL — ABNORMAL HIGH (ref 70–99)
Glucose-Capillary: 124 mg/dL — ABNORMAL HIGH (ref 70–99)

## 2020-08-01 NOTE — TOC Progression Note (Addendum)
Transition of Care [facility]) - Progression Note    Patient Details  Name: Todd Lloyd MRN: [id number] Date of Birth: April 01, 1941  Transition of Care [facility]) CM/SW [place], [place] Phone Number: 08/01/2020, 10:15 AM  Clinical Narrative:    CSW gave dtr [name], first 5 bed offers and encouraged her to research her options. As of right now pt is NMR to DC, She was informed someone would follow up with her next week to solidify plans. [name] noted that her parents also have Medicare F and G, and that she would call for Medicaid assistance first thing in the morning. SW will continue to follow.  1:45 PM: CSW was notified by daughter that they are interested in [facility] for pt when he is ready to DC. CSW updated facility and they will follow for a DC date. SW will continue to follow.  Expected Discharge Plan: [place] Barriers to Discharge: SNF Pending bed offer,Continued Medical Work up  Expected Discharge Plan and Services Expected Discharge Plan: [place] Choice: [place] arrangements for the past 2 months: Single Family Home                                       Social Determinants of Health (SDOH) Interventions    Readmission Risk Interventions No flowsheet data found.

## 2020-08-01 NOTE — Progress Notes (Signed)
Patient ID: Todd Lloyd, male   DOB: 27-Jun-1940, 80 y.o.   MRN: [id number]  PROGRESS NOTE    Todd Lloyd  WEX:[id number] DOB: 1940-10-02 DOA: 07/26/2020 PCP: Haywood Pao, MD   Brief Narrative:  80 year old male with history of COPD, stage I non-small cell lung cancer status post radiation in 04/2020, dementia, paroxysmal A. fib on Eliquis, GERD, leukemia status post bone marrow transplantation in 1990 was admitted to ICU under PCCM service with altered mental status after a fall and diarrhea.  He was found to be in shock/respiratory failure requiring BiPAP, acute kidney injury, rhabdomyolysis.  CT of the head and C-spine were negative for acute abnormalities.  CT of the abdomen and pelvis showed focal colitis without evidence of perforation.  Neurology and nephrology were consulted.  He was treated with bicarb drip.  EEG was negative for seizures.  He was treated with broad-spectrum antibiotics initially; stool was positive for rotavirus and enteropathogenic E. coli for which she was given a dose of Zithromax as well.  He was treated with intravenous fluids initially.  Urine output has been minimal and he has been getting intermittent Lasix recently by nephrology.  Mental status has improved; neurology signed off.  Patient was transferred to [facility] service on 07/31/2020.  Assessment & Plan:   Septic shock: Present on admission; shock is resolved Rotavirus and enteropathogenic E. coli diarrhea -Patient was initially treated with IV fluids and broad-spectrum antibiotics.  Patient also received empiric coverage for meningitis because of altered mental status which were stopped on 07/27/2020.  Blood and urine cultures have been negative so far.  Covid/flu were negative on admission.  C. difficile testing was negative.  GI PCR was positive for rotavirus/enteropathogenic E. coli.  He received a dose of Zithromax on 07/28/2020 to cover enteropathogenic E. Coli. -Antibiotics have subsequently been  discontinued. -Blood pressure on the lower side but stable.  Off pressors. -Transferred to [facility] service on 07/31/2020.  Acute metabolic encephalopathy History of dementia -Patient was encephalopathic probably secondary to shock/respiratory failure. -EEG was negative for seizures.  Neurology has signed off.  MRI has been deferred because of improvement in mental status. -Continue Namenda and Aricept -Fall precautions -PT is recommending SNF.  Social worker following  Acute renal failure Rhabdomyolysis Acute metabolic acidosis: Acidosis has resolved -Likely from ATN from shock and rhabdomyolysis -Initially treated with IV fluids and then subsequently with intermittent Lasix.  Urine output still very poor.  Nephrology following: Might need hemodialysis at some point but patient would want to avoid at this time.  Creatinine 8.44 today. -CK total was trending down -Renal ultrasound was negative for obstruction.  Avoid nephrotoxic medications.  Losartan and diuretics on hold for now.  Acute hypoxic respiratory failure COPD with acute exacerbation Stage I right lung cancer status post radiation -Respiratory status improving.  Currently on 3-4 L oxygen via nasal cannula and required BiPAP at night.  Continue BiPAP at night and as needed for now.  PCCM has signed off -Echo showed EF of 55 to 60% -Continue nebs.  Currently not on any steroids.  Paroxysmal A. Fib -Rate controlled.  Continue low-dose Eliquis.  Digoxin on hold.  Outpatient follow-up with cardiology  Leukocytosis -Resolved  Thrombocytopenia -Likely due to sepsis.  Monitor.  No signs of bleeding currently  Anemia of chronic disease -Probably from acute on chronic illnesses.  Hemoglobin stable.  Transfuse if hemoglobin is less than 7  GERD -Continue PPI    DVT prophylaxis: Eliquis Code Status: DNR Family  Communication: None at bedside Disposition Plan: Status is: Inpatient  Remains inpatient appropriate  because:Inpatient level of care appropriate due to severity of illness   Dispo: The patient is from: Home              Anticipated d/c is to: SNF              Patient currently is not medically stable to d/c.   Difficult to place patient No  Consultants: PCCM/nephrology  Procedures: Echo  Antimicrobials:  Anti-infectives (From admission, onward)   Start     Dose/Rate Route Frequency Ordered Stop   07/28/20 0845  azithromycin (ZITHROMAX) tablet 1,000 mg        1,000 mg Oral  Once 07/28/20 0747 07/28/20 0827   07/28/20 0608  cefTRIAXone (ROCEPHIN) 2 g in sodium chloride 0.9 % 100 mL IVPB  Status:  Discontinued        2 g 200 mL/hr over 30 Minutes Intravenous Every 24 hours 07/27/20 1447 07/28/20 0747   07/27/20 1045  metroNIDAZOLE (FLAGYL) IVPB 500 mg        500 mg 100 mL/hr over 60 Minutes Intravenous  Once 07/27/20 0956 07/27/20 1113   07/26/20 1930  acyclovir (ZOVIRAX) 775 mg in dextrose 5 % 150 mL IVPB  Status:  Discontinued        10 mg/kg  77.6 kg (Ideal) 165.5 mL/hr over 60 Minutes Intravenous Every 24 hours 07/26/20 1848 07/27/20 1424   07/26/20 1915  ampicillin (OMNIPEN) 2 g in sodium chloride 0.9 % 100 mL IVPB  Status:  Discontinued        2 g 300 mL/hr over 20 Minutes Intravenous Every 8 hours 07/26/20 1848 07/27/20 1424   07/26/20 1900  cefTRIAXone (ROCEPHIN) 2 g in sodium chloride 0.9 % 100 mL IVPB  Status:  Discontinued        2 g 200 mL/hr over 30 Minutes Intravenous Every 12 hours 07/26/20 1846 07/27/20 1447   07/26/20 1851  vancomycin variable dose per unstable renal function (pharmacist dosing)  Status:  Discontinued         Does not apply See admin instructions 07/26/20 1853 07/27/20 1424   07/26/20 1815  ceFEPIme (MAXIPIME) 2 g in sodium chloride 0.9 % 100 mL IVPB  Status:  Discontinued        2 g 200 mL/hr over 30 Minutes Intravenous  Once 07/26/20 1802 07/26/20 1846   07/26/20 1815  metroNIDAZOLE (FLAGYL) IVPB 500 mg        500 mg 100 mL/hr over 60 Minutes  Intravenous  Once 07/26/20 1802 07/26/20 2049   07/26/20 1815  vancomycin (VANCOREADY) IVPB 1750 mg/350 mL        1,750 mg 175 mL/hr over 120 Minutes Intravenous  Once 07/26/20 1802 07/26/20 2228       Subjective: Patient seen and examined at bedside.  No overnight chest pain, worsening shortness breath, fever reported.  Poor historian.  Objective: Vitals:   08/01/20 0426 08/01/20 0741 08/01/20 0830 08/01/20 0937  BP:  (!) 113/55    Pulse: 71 82    Resp:  19    Temp:  97.9 F (36.6 C)    TempSrc:      SpO2: 96% 97%  93%  Weight:   105.6 kg   Height:        Intake/Output Summary (Last 24 hours) at 08/01/2020 0942 Last data filed at 08/01/2020 0600 Gross per 24 hour  Intake -  Output 350 ml  Net -350  ml   Filed Weights   07/28/20 2000 07/30/20 0332 08/01/20 0830  Weight: 104.9 kg 105.5 kg 105.6 kg    Examination:  General exam: No distress.  Poor historian.  Looks chronically ill.  Currently on 3 L oxygen via nasal cannula  respiratory system: Decreased breath sounds at bases bilaterally with some crackles heart Cardiovascular system: Rate controlled, S1-S2 heard Gastrointestinal system: Abdomen is slightly distended, soft and nontender.  Bowel sounds are heard  extremities: Mild lower extremity edema present; no clubbing Central nervous system: Awake; still very slow to respond to questions.  No focal neurological deficits.  Moving extremities Skin: No obvious ecchymosis/lesions  psychiatry: Affect is flat    Data Reviewed: I have personally reviewed following labs and imaging studies  CBC: Recent Labs  Lab 07/28/20 0103 07/29/20 0108 07/30/20 0135 07/31/20 0139 08/01/20 0458  WBC 16.0* 8.5 6.1 5.1 4.5  HGB 9.1* 8.3* 8.2* 8.3* 8.0*  HCT 30.4* 27.6* 27.0* 27.1* 25.8*  MCV 91.0 90.8 90.9 91.2 89.6  PLT 86* 75* 84* 87* 099*   Basic Metabolic Panel: Recent Labs  Lab 07/27/20 0136 07/27/20 0254 07/29/20 0108 07/29/20 2139 07/30/20 0135 07/31/20 0139  07/31/20 2133 08/01/20 0458  NA 139   < > 143 141 140 140 138 139  K 5.4*   < > 4.2 4.7 3.9 4.0 3.7 3.7  CL 107   < > 100 104 101 101 101 102  CO2 19*   < > 25 23 26 27 26 23   GLUCOSE 105*   < > 69* 82 86 116* 120* 102*  BUN 52*   < > 61* 66* 66* 72* 75* 78*  CREATININE 3.89*   < > 6.45* 7.12* 7.31* 8.07* 8.11* 8.44*  CALCIUM 7.2*   < > 7.0* 6.9* 6.9* 7.3* 7.1* 7.2*  MG 1.9  --  1.5* 1.9 2.0  --  1.9  --   PHOS 7.5*  --  5.3*  --  5.5* 6.0*  --  6.1*   < > = values in this interval not displayed.   GFR: Estimated Creatinine Clearance: 8.8 mL/min (A) (by C-G formula based on SCr of 8.44 mg/dL (H)). Liver Function Tests: Recent Labs  Lab 07/26/20 1727 07/27/20 0136 07/28/20 0103 07/29/20 0108 07/30/20 0135 07/31/20 0139 08/01/20 0458  AST 131* 231* 271* 233*  --   --   --   ALT 67* 86* 117* 126*  --   --   --   ALKPHOS 46 42 38 38  --   --   --   BILITOT 1.0 0.8 0.6 0.7  --   --   --   PROT 5.6* 5.7* 5.4* 5.1*  --   --   --   ALBUMIN 3.1* 3.4* 3.1* 2.8*  2.8* 3.0* 2.8* 2.5*   No results for input(s): LIPASE, AMYLASE in the last 168 hours. No results for input(s): AMMONIA in the last 168 hours. Coagulation Profile: Recent Labs  Lab 07/26/20 1802  INR 1.7*   Cardiac Enzymes: Recent Labs  Lab 07/26/20 1727 07/26/20 2059 07/27/20 0136 07/27/20 1120 07/28/20 0103  CKTOTAL 4,945* 7,306* 11,849*  10,573* 12,472* 9,746*   BNP (last 3 results) No results for input(s): PROBNP in the last 8760 hours. HbA1C: No results for input(s): HGBA1C in the last 72 hours. CBG: Recent Labs  Lab 07/31/20 1639 07/31/20 1947 07/31/20 2336 08/01/20 0329 08/01/20 0740  GLUCAP 126* 114* 123* 100* 91   Lipid Profile: No results for input(s): CHOL, HDL,  LDLCALC, TRIG, CHOLHDL, LDLDIRECT in the last 72 hours. Thyroid Function Tests: No results for input(s): TSH, T4TOTAL, FREET4, T3FREE, THYROIDAB in the last 72 hours. Anemia Panel: No results for input(s): VITAMINB12, FOLATE,  FERRITIN, TIBC, IRON, RETICCTPCT in the last 72 hours. Sepsis Labs: Recent Labs  Lab 07/26/20 1727 07/26/20 2042 07/26/20 2206 07/27/20 0136 07/28/20 0103  PROCALCITON  --  26.97  --  39.52 61.41  LATICACIDVEN 1.3  --  1.3  --   --     Recent Results (from the past 240 hour(s))  Resp Panel by RT-PCR (Flu A&B, Covid) Nasopharyngeal Swab     Status: None   Collection Time: 07/26/20  5:27 PM   Specimen: Nasopharyngeal Swab; Nasopharyngeal(NP) swabs in vial transport medium  Result Value Ref Range Status   SARS Coronavirus 2 by RT PCR NEGATIVE NEGATIVE Final    Comment: (NOTE) SARS-CoV-2 target nucleic acids are NOT DETECTED.  The SARS-CoV-2 RNA is generally detectable in upper respiratory specimens during the acute phase of infection. The lowest concentration of SARS-CoV-2 viral copies this assay can detect is 138 copies/mL. A negative result does not preclude SARS-Cov-2 infection and should not be used as the sole basis for treatment or other patient management decisions. A negative result may occur with  improper specimen collection/handling, submission of specimen other than nasopharyngeal swab, presence of viral mutation(s) within the areas targeted by this assay, and inadequate number of viral copies(<138 copies/mL). A negative result must be combined with clinical observations, patient history, and epidemiological information. The expected result is Negative.  Fact Sheet for Patients:  EntrepreneurPulse.com.au  Fact Sheet for Healthcare Providers:  IncredibleEmployment.be  This test is no t yet approved or cleared by the [place] FDA and  has been authorized for detection and/or diagnosis of SARS-CoV-2 by FDA under an Emergency Use Authorization (EUA). This EUA will remain  in effect (meaning this test can be used) for the duration of the COVID-19 declaration under Section 564(b)(1) of the Act, 21 U.S.C.section 360bbb-3(b)(1),  unless the authorization is terminated  or revoked sooner.       Influenza A by PCR NEGATIVE NEGATIVE Final   Influenza B by PCR NEGATIVE NEGATIVE Final    Comment: (NOTE) The Xpert Xpress SARS-CoV-2/FLU/RSV plus assay is intended as an aid in the diagnosis of influenza from Nasopharyngeal swab specimens and should not be used as a sole basis for treatment. Nasal washings and aspirates are unacceptable for Xpert Xpress SARS-CoV-2/FLU/RSV testing.  Fact Sheet for Patients: EntrepreneurPulse.com.au  Fact Sheet for Healthcare Providers: IncredibleEmployment.be  This test is not yet approved or cleared by the [place] FDA and has been authorized for detection and/or diagnosis of SARS-CoV-2 by FDA under an Emergency Use Authorization (EUA). This EUA will remain in effect (meaning this test can be used) for the duration of the COVID-19 declaration under Section 564(b)(1) of the Act, 21 U.S.C. section 360bbb-3(b)(1), unless the authorization is terminated or revoked.  Performed at [facility], [place] [address], [place], [place] [address]   Blood Culture (routine x 2)     Status: None   Collection Time: 07/26/20  5:44 PM   Specimen: BLOOD LEFT ARM  Result Value Ref Range Status   Specimen Description BLOOD LEFT ARM  Final   Special Requests   Final    BOTTLES DRAWN AEROBIC AND ANAEROBIC Blood Culture results may not be optimal due to an inadequate volume of blood received in culture bottles   Culture   Final  NO GROWTH 5 DAYS Performed at [facility], [place] [address], [place], [place] [address]    Report Status 07/31/2020 FINAL  Final  Blood Culture (routine x 2)     Status: None   Collection Time: 07/26/20  6:25 PM   Specimen: BLOOD LEFT ARM  Result Value Ref Range Status   Specimen Description BLOOD LEFT ARM  Final   Special Requests   Final    AEROBIC BOTTLE ONLY Blood Culture results may not be optimal due to an  inadequate volume of blood received in culture bottles   Culture   Final    NO GROWTH 5 DAYS Performed at [facility], [place] [address] [place], [place] [address]    Report Status 07/31/2020 FINAL  Final  C Difficile Quick Screen w PCR reflex     Status: None   Collection Time: 07/26/20  8:54 PM   Specimen: STOOL  Result Value Ref Range Status   C Diff antigen NEGATIVE NEGATIVE Final   C Diff toxin NEGATIVE NEGATIVE Final   C Diff interpretation No C. difficile detected.  Final    Comment: Performed at [facility], [place] [address], [place], [place] [address]  Urine culture     Status: None   Collection Time: 07/26/20  9:16 PM   Specimen: In/Out Cath Urine  Result Value Ref Range Status   Specimen Description IN/OUT CATH URINE  Final   Special Requests NONE  Final   Culture   Final    NO GROWTH Performed at [facility], [place] [address], [place], [place] [address]    Report Status 07/28/2020 FINAL  Final  Gastrointestinal Panel by PCR , Stool     Status: Abnormal   Collection Time: 07/27/20  3:06 AM   Specimen: Nasal Mucosa; Stool  Result Value Ref Range Status   Campylobacter species NOT DETECTED NOT DETECTED Final   Plesimonas shigelloides NOT DETECTED NOT DETECTED Final   Salmonella species NOT DETECTED NOT DETECTED Final   Yersinia enterocolitica NOT DETECTED NOT DETECTED Final   Vibrio species NOT DETECTED NOT DETECTED Final   Vibrio cholerae NOT DETECTED NOT DETECTED Final   Enteroaggregative E coli (EAEC) NOT DETECTED NOT DETECTED Final   Enteropathogenic E coli (EPEC) DETECTED (A) NOT DETECTED Final    Comment: RESULT CALLED TO, READ BACK BY AND VERIFIED WITH: BRANDY TURNER 07/27/20 1708 KLW    Enterotoxigenic E coli (ETEC) NOT DETECTED NOT DETECTED Final   Shiga like toxin producing E coli (STEC) NOT DETECTED NOT DETECTED Final   Shigella/Enteroinvasive E coli (EIEC) NOT DETECTED NOT DETECTED Final   Cryptosporidium NOT DETECTED NOT DETECTED Final    Cyclospora cayetanensis NOT DETECTED NOT DETECTED Final   Entamoeba histolytica NOT DETECTED NOT DETECTED Final   Giardia lamblia NOT DETECTED NOT DETECTED Final   Adenovirus F40/41 NOT DETECTED NOT DETECTED Final   Astrovirus NOT DETECTED NOT DETECTED Final   Norovirus GI/GII NOT DETECTED NOT DETECTED Final   Rotavirus A DETECTED (A) NOT DETECTED Final    Comment: RESULT CALLED TO, READ BACK BY AND VERIFIED WITH: BRANDY TURNER 07/27/20 1708 KLW    Sapovirus (I, II, IV, and V) NOT DETECTED NOT DETECTED Final    Comment: Performed at [facility], [place] [place],  [address]  MRSA PCR Screening     Status: None   Collection Time: 07/27/20  7:21 PM   Specimen: Nasal Mucosa; Nasopharyngeal  Result Value Ref Range  Status   MRSA by PCR NEGATIVE NEGATIVE Final    Comment:        The GeneXpert MRSA Assay (FDA approved for NASAL specimens only), is one component of a comprehensive MRSA colonization surveillance program. It is not intended to diagnose MRSA infection nor to guide or monitor treatment for MRSA infections. Performed at [facility], [place] [address] [place], [place] [address]          Radiology Studies: No results found.      Scheduled Meds: . apixaban  2.5 mg Oral BID  . budesonide (PULMICORT) nebulizer solution  0.5 mg Nebulization BID  . chlorhexidine  15 mL Mouth Rinse BID  . Chlorhexidine Gluconate Cloth  6 each Topical Q0600  . donepezil  5 mg Oral QHS  . ipratropium-albuterol  3 mL Nebulization Q6H  . mouth rinse  15 mL Mouth Rinse q12n4p  . melatonin  5 mg Oral QHS  . memantine  5 mg Oral BID  . pantoprazole  40 mg Oral Daily   Continuous Infusions: . sodium chloride 10 mL/hr at 07/30/20 1800  . lactated ringers 10 mL/hr at 07/30/20 1800          Kshitiz Starla Link, MD [facility] 08/01/2020, 9:42 AM

## 2020-08-01 NOTE — Plan of Care (Signed)

## 2020-08-01 NOTE — Progress Notes (Signed)
Nephrology Follow-Up Consult note   Assessment/Recommendations: Todd Lloyd is a/an 80 y.o. male with a past medical history significant for COPD, lung cancer status post treatment, dementia, atrial fibrillation who present w/ sepsis now with AKI  Oliguric AKI: Likely secondary to ATN from shock as well as rhabdomyolysis.  Significant AKI before he even arrived to the hospital.    He has intermittently demonstrated signs of uremia. He has previously stated he wants to hold off on dialysis as long as possible.  Given his signs of uremia are minimal at this time we can do so -Creatinine seems to be plateauing at 8.4 today with slight improvement in urine output.  Continue to monitor -Continue to monitor daily Cr, Dose meds for GFR -Monitor Daily I/Os, Daily weight  -Maintain MAP>65 for optimal renal perfusion.  -Avoid nephrotoxic medications including NSAIDs and Vanc/Zosyn combo -Renal ultrasound without signs of obstruction -Agree with holding losartan and diuretics -Currently no IV fluids running; encourage p.o. hydration  Acute hypoxic and hypercarbic respiratory failure: History of COPD.  Management per primary.  Supplemental oxygen as needed  Shock: Likely rotavirus and E. coli.    Overall improved.  Shock improved  Altered mental status: Overall improved but remains delirious.  Likely multifactorial  Hypertension: Blood pressure low at this time related to shock.  Holding home medications     Recommendations conveyed to primary service.    [facility] 08/01/2020 9:53 AM  ___________________________________________________________  CC: AKI  Interval History/Subjective: Patient states he is very frustrated today because he does not know why he is in the hospital and why he cannot walk around.  He wants to leave but also has noted to be confused.  I discussed his kidney injury at length and he just continually said he does not  understand what I am saying.   Medications:  Current Facility-Administered Medications  Medication Dose Route Frequency Provider Last Rate Last Admin  . 0.9 %  sodium chloride infusion  250 mL Intravenous Continuous Omar Person, NP 10 mL/hr at 07/30/20 1800 Infusion Verify at 07/30/20 1800  . acetaminophen (TYLENOL) tablet 650 mg  650 mg Oral Q4H PRN Vernelle Emerald, MD   650 mg at 07/31/20 2106  . albuterol (PROVENTIL) (2.5 MG/3ML) 0.083% nebulizer solution 2.5 mg  2.5 mg Nebulization Q4H PRN Omar Person, NP      . apixaban Arne Cleveland) tablet 2.5 mg  2.5 mg Oral BID Noemi Chapel P, DO   2.5 mg at 07/31/20 2107  . budesonide (PULMICORT) nebulizer solution 0.5 mg  0.5 mg Nebulization BID Renee Pain, MD   0.5 mg at 08/01/20 6962  . chlorhexidine (PERIDEX) 0.12 % solution 15 mL  15 mL Mouth Rinse BID Renee Pain, MD   15 mL at 07/31/20 2120  . Chlorhexidine Gluconate Cloth 2 % PADS 6 each  6 each Topical Q0600 Collene Gobble, MD   6 each at 07/31/20 2300  . donepezil (ARICEPT) tablet 5 mg  5 mg Oral QHS Jennelle Human B, NP   5 mg at 07/31/20 2107  . Gerhardt's butt cream   Topical QID PRN Arnell Asal, NP   Given at 07/28/20 1128  . ipratropium-albuterol (DUONEB) 0.5-2.5 (3) MG/3ML nebulizer solution 3 mL  3 mL Nebulization Q6H Renee Pain, MD   3 mL at 08/01/20 0938  . lactated ringers infusion   Intravenous Continuous Collene Gobble, MD 10 mL/hr at 07/30/20 1800 Infusion Verify at 07/30/20 1800  .  loperamide (IMODIUM) capsule 2 mg  2 mg Oral PRN Jennelle Human B, NP   2 mg at 07/29/20 2206  . MEDLINE mouth rinse  15 mL Mouth Rinse q12n4p Renee Pain, MD   15 mL at 07/31/20 2108  . melatonin tablet 5 mg  5 mg Oral QHS Sloan Leiter B, RPH   5 mg at 07/31/20 2107  . memantine (NAMENDA) tablet 5 mg  5 mg Oral BID Jennelle Human B, NP   5 mg at 07/31/20 2121  . pantoprazole (PROTONIX) EC tablet 40 mg  40 mg Oral Daily Sloan Leiter B, RPH   40 mg at  07/31/20 0953  . polyethylene glycol (MIRALAX / GLYCOLAX) packet 17 g  17 g Oral Daily PRN Omar Person, NP          Review of Systems: 10 systems reviewed and negative except per interval history/subjective  Physical Exam: Vitals:   08/01/20 0741 08/01/20 0937  BP: (!) 113/55   Pulse: 82   Resp: 19   Temp: 97.9 F (36.6 C)   SpO2: 97% 93%   No intake/output data recorded.  Intake/Output Summary (Last 24 hours) at 08/01/2020 0953 Last data filed at 08/01/2020 0600 Gross per 24 hour  Intake -  Output 350 ml  Net -350 ml   General: Ill-appearing, lying in bed, no distress HEENT: anicteric sclera, oropharynx clear without lesions CV: Normal rate, trace edema in the lower extremities bilaterally Lungs:  Bilateral chest rise, mild increased work of breathing Abd: soft, non-tender Skin: no visible lesions or rashes Psych: alert,  engaged but slightly confused Musculoskeletal: no obvious deformities Neuro: normal speech, minimal tremor present, confused   Test Results I personally reviewed new and old clinical labs and radiology tests Lab Results  Component Value Date   NA 139 08/01/2020   K 3.7 08/01/2020   CL 102 08/01/2020   CO2 23 08/01/2020   BUN 78 (H) 08/01/2020   CREATININE 8.44 (H) 08/01/2020   CALCIUM 7.2 (L) 08/01/2020   ALBUMIN 2.5 (L) 08/01/2020   PHOS 6.1 (H) 08/01/2020

## 2020-08-02 ENCOUNTER — Encounter (HOSPITAL_COMMUNITY): Payer: Self-pay | Admitting: Pulmonary Disease

## 2020-08-02 ENCOUNTER — Other Ambulatory Visit: Payer: Self-pay

## 2020-08-02 DIAGNOSIS — R748 Abnormal levels of other serum enzymes: Secondary | ICD-10-CM

## 2020-08-02 DIAGNOSIS — N179 Acute kidney failure, unspecified: Secondary | ICD-10-CM | POA: Diagnosis not present

## 2020-08-02 DIAGNOSIS — J9602 Acute respiratory failure with hypercapnia: Secondary | ICD-10-CM | POA: Diagnosis not present

## 2020-08-02 DIAGNOSIS — A419 Sepsis, unspecified organism: Secondary | ICD-10-CM | POA: Diagnosis not present

## 2020-08-02 LAB — RENAL FUNCTION PANEL
Albumin: 2.5 g/dL — ABNORMAL LOW (ref 3.5–5.0)
Anion gap: 12 (ref 5–15)
BUN: 82 mg/dL — ABNORMAL HIGH (ref 8–23)
CO2: 26 mmol/L (ref 22–32)
Calcium: 7.4 mg/dL — ABNORMAL LOW (ref 8.9–10.3)
Chloride: 102 mmol/L (ref 98–111)
Creatinine, Ser: 8.78 mg/dL — ABNORMAL HIGH (ref 0.61–1.24)
GFR, Estimated: 6 mL/min — ABNORMAL LOW (ref >60)
Glucose, Bld: 130 mg/dL — ABNORMAL HIGH (ref 70–99)
Phosphorus: 6.9 mg/dL — ABNORMAL HIGH (ref 2.5–4.6)
Potassium: 3.7 mmol/L (ref 3.5–5.1)
Sodium: 140 mmol/L (ref 135–145)

## 2020-08-02 LAB — CBC
HCT: 26.4 % — ABNORMAL LOW (ref 39.0–52.0)
Hemoglobin: 8 g/dL — ABNORMAL LOW (ref 13.0–17.0)
MCH: 27.3 pg (ref 26.0–34.0)
MCHC: 30.3 g/dL (ref 30.0–36.0)
MCV: 90.1 fL (ref 80.0–100.0)
Platelets: 130 10*3/uL — ABNORMAL LOW (ref 150–400)
RBC: 2.93 MIL/uL — ABNORMAL LOW (ref 4.22–5.81)
RDW: 19.6 % — ABNORMAL HIGH (ref 11.5–15.5)
WBC: 4.7 10*3/uL (ref 4.0–10.5)
nRBC: 0 % (ref 0.0–0.2)

## 2020-08-02 LAB — GLUCOSE, CAPILLARY
Glucose-Capillary: 99 mg/dL (ref 70–99)
Glucose-Capillary: 116 mg/dL — ABNORMAL HIGH (ref 70–99)
Glucose-Capillary: 139 mg/dL — ABNORMAL HIGH (ref 70–99)
Glucose-Capillary: 144 mg/dL — ABNORMAL HIGH (ref 70–99)

## 2020-08-02 MED ORDER — IPRATROPIUM-ALBUTEROL 0.5-2.5 (3) MG/3ML IN SOLN
3.0000 mL | Freq: Three times a day (TID) | RESPIRATORY_TRACT | Status: DC
  Administered 2020-08-02 – 2020-08-03 (×2): 3 mL via RESPIRATORY_TRACT
  Filled 2020-08-02 (×2): qty 3

## 2020-08-02 MED ORDER — FUROSEMIDE 10 MG/ML IJ SOLN
80.0000 mg | Freq: Once | INTRAMUSCULAR | Status: AC
  Administered 2020-08-02: 80 mg via INTRAVENOUS
  Filled 2020-08-02: qty 8

## 2020-08-02 MED ORDER — ALBUMIN HUMAN 25 % IV SOLN
12.5000 g | Freq: Once | INTRAVENOUS | Status: AC
  Administered 2020-08-02: 12.5 g via INTRAVENOUS
  Filled 2020-08-02: qty 50

## 2020-08-02 NOTE — Care Management Important Message (Signed)
Important Message  Patient Details  Name: Todd Lloyd MRN: [id number] Date of Birth: 28-Apr-1941   Medicare Important Message Given:  Yes     [name] Circle 08/02/2020, 4:16 PM

## 2020-08-02 NOTE — Plan of Care (Signed)
  Problem: Clinical Measurements: Goal: Respiratory complications will improve Outcome: Progressing   Problem: Activity: Goal: Risk for activity intolerance will decrease Outcome: Progressing Note: Pt is up in chair with PT assistance this morning    Problem: Nutrition: Goal: Adequate nutrition will be maintained Outcome: Progressing   Problem: Elimination: Goal: Will not experience complications related to bowel motility Outcome: Progressing Note: Last BM 3/14 Goal: Will not experience complications related to urinary retention Outcome: Progressing

## 2020-08-02 NOTE — Progress Notes (Signed)
Pt. Placed on BIPAP at this time 16/6 R12-40%, VSS RT will continue to monitor.

## 2020-08-02 NOTE — Progress Notes (Signed)
Patient ID: Todd Lloyd, male   DOB: 03-01-1941, 80 y.o.   MRN: [id number]  PROGRESS NOTE    Todd Lloyd  QBH:[id number] DOB: [birth year] DOA: 07/26/2020 PCP: Haywood Pao, MD   Brief Narrative:  80 year old male with history of COPD, stage I non-small cell lung cancer status post radiation in 04/2020, dementia, paroxysmal A. fib on Eliquis, GERD, leukemia status post bone marrow transplantation in 1990 was admitted to ICU under PCCM service with altered mental status after a fall and diarrhea.  He was found to be in shock/respiratory failure requiring BiPAP, acute kidney injury, rhabdomyolysis.  CT of the head and C-spine were negative for acute abnormalities.  CT of the abdomen and pelvis showed focal colitis without evidence of perforation.  Neurology and nephrology were consulted.  He was treated with bicarb drip.  EEG was negative for seizures.  He was treated with broad-spectrum antibiotics initially; stool was positive for rotavirus and enteropathogenic E. coli for which she was given a dose of Zithromax as well.  He was treated with intravenous fluids initially.  Urine output has been minimal and he has been getting intermittent Lasix recently by nephrology.  Mental status has improved; neurology signed off.  Patient was transferred to [facility] service on 07/31/2020.  Assessment & Plan:   Acute renal failure Rhabdomyolysis Acute metabolic acidosis: Acidosis has resolved -Likely from ATN from shock and rhabdomyolysis -Initially treated with IV fluids and then subsequently with intermittent Lasix.  Urine output is still not that good.  Nephrology following: Might need hemodialysis at some point but patient would want to avoid at this time.  Creatinine 8.78 today. -CK total was trending down -Renal ultrasound was negative for obstruction.  Avoid nephrotoxic medications.  Losartan and diuretics on hold for now.  Septic shock: Present on admission; shock is resolved Rotavirus and  enteropathogenic E. coli diarrhea -Patient was initially treated with IV fluids and broad-spectrum antibiotics.  Patient also received empiric coverage for meningitis because of altered mental status which were stopped on 07/27/2020.  Blood and urine cultures have been negative so far.  Covid/flu were negative on admission.  C. difficile testing was negative.  GI PCR was positive for rotavirus/enteropathogenic E. coli.  He received a dose of Zithromax on 07/28/2020 to cover enteropathogenic E. Coli. -Antibiotics have subsequently been discontinued. -Blood pressure currently stable.  Off pressors. -Transferred to [facility] service on 07/31/2020.  Acute metabolic encephalopathy History of dementia -Patient was encephalopathic probably secondary to shock/respiratory failure. -EEG was negative for seizures.  Neurology has signed off.  MRI has been deferred because of improvement in mental status. -Continue Namenda and Aricept -Fall precautions -PT is recommending SNF.  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  Currently on 2-3 L oxygen via nasal cannula and still requiring BiPAP at night.  Continue BiPAP at night and as needed for now.  PCCM has signed off -Echo showed EF of 55 to 60% -Continue nebs.  Currently not on any steroids.  Paroxysmal A. Fib -Rate controlled.  Continue low-dose Eliquis.  Digoxin on hold.  Outpatient follow-up with cardiology  Leukocytosis -Resolved  Thrombocytopenia -Likely due to sepsis.  Monitor.  No signs of bleeding currently  Anemia of chronic disease -Probably from acute on chronic illnesses.  Hemoglobin stable.  Transfuse if hemoglobin is less than 7  GERD -Continue PPI    DVT prophylaxis: Eliquis Code Status: DNR Family Communication:  None at bedside Disposition Plan: Status is: Inpatient  Remains inpatient appropriate  because:Inpatient level of care appropriate due to severity of illness   Dispo: The patient is from: Home              Anticipated d/c is to: SNF              Patient currently is not medically stable to d/c.   Difficult to place patient No  Consultants: PCCM/nephrology  Procedures: Echo  Antimicrobials:  Anti-infectives (From admission, onward)   Start     Dose/Rate Route Frequency Ordered Stop   07/28/20 0845  azithromycin (ZITHROMAX) tablet 1,000 mg        1,000 mg Oral  Once 07/28/20 0747 07/28/20 0827   07/28/20 0608  cefTRIAXone (ROCEPHIN) 2 g in sodium chloride 0.9 % 100 mL IVPB  Status:  Discontinued        2 g 200 mL/hr over 30 Minutes Intravenous Every 24 hours 07/27/20 1447 07/28/20 0747   07/27/20 1045  metroNIDAZOLE (FLAGYL) IVPB 500 mg        500 mg 100 mL/hr over 60 Minutes Intravenous  Once 07/27/20 0956 07/27/20 1113   07/26/20 1930  acyclovir (ZOVIRAX) 775 mg in dextrose 5 % 150 mL IVPB  Status:  Discontinued        10 mg/kg  77.6 kg (Ideal) 165.5 mL/hr over 60 Minutes Intravenous Every 24 hours 07/26/20 1848 07/27/20 1424   07/26/20 1915  ampicillin (OMNIPEN) 2 g in sodium chloride 0.9 % 100 mL IVPB  Status:  Discontinued        2 g 300 mL/hr over 20 Minutes Intravenous Every 8 hours 07/26/20 1848 07/27/20 1424   07/26/20 1900  cefTRIAXone (ROCEPHIN) 2 g in sodium chloride 0.9 % 100 mL IVPB  Status:  Discontinued        2 g 200 mL/hr over 30 Minutes Intravenous Every 12 hours 07/26/20 1846 07/27/20 1447   07/26/20 1851  vancomycin variable dose per unstable renal function (pharmacist dosing)  Status:  Discontinued         Does not apply See admin instructions 07/26/20 1853 07/27/20 1424   07/26/20 1815  ceFEPIme (MAXIPIME) 2 g in sodium chloride 0.9 % 100 mL IVPB  Status:  Discontinued        2 g 200 mL/hr over 30 Minutes Intravenous  Once 07/26/20 1802 07/26/20 1846   07/26/20 1815  metroNIDAZOLE (FLAGYL) IVPB 500 mg        500 mg 100 mL/hr over 60 Minutes  Intravenous  Once 07/26/20 1802 07/26/20 2049   07/26/20 1815  vancomycin (VANCOREADY) IVPB 1750 mg/350 mL        1,750 mg 175 mL/hr over 120 Minutes Intravenous  Once 07/26/20 1802 07/26/20 2228       Subjective: Patient seen and examined at bedside.  Poor historian.  No worsening shortness of breath, chest pain, fever reported. Objective: Vitals:   08/01/20 2211 08/02/20 0003 08/02/20 0213 08/02/20 0323  BP:  128/61  137/63  Pulse:  80 79 81  Resp: 20 20  (!) 22  Temp:  97.9 F (36.6 C)  97.8 F (36.6 C)  TempSrc:      SpO2: 98% 100% 99% 98%  Weight:      Height:        Intake/Output Summary (Last 24 hours) at 08/02/2020 0743 Last data filed at 08/01/2020 2009 Gross per 24 hour  Intake 960 ml  Output 600 ml  Net  360 ml   Filed Weights   07/28/20 2000 07/30/20 0332 08/01/20 0830  Weight: 104.9 kg 105.5 kg 105.6 kg    Examination:  General exam: Poor historian.  Looks chronically ill.  Currently on 2 to 3 L oxygen via nasal cannula.  Required BiPAP overnight.  No distress. respiratory system: Bilateral decreased breath sounds at bases with some crackles, no wheezing.  Intermittent tachypnea  cardiovascular system: S1-S2 heard, rate controlled Gastrointestinal system: Abdomen is distended mildly, soft and nontender.  Normal bowel sounds heard  extremities: No cyanosis; bilateral lower extremity edema present Central nervous system: Still very slow to respond to questions; alert.  No focal neurological deficits.  Moves extremities  skin: No obvious petechiae/rashes psychiatry: Flat affect   Data Reviewed: I have personally reviewed following labs and imaging studies  CBC: Recent Labs  Lab 07/29/20 0108 07/30/20 0135 07/31/20 0139 08/01/20 0458 08/02/20 0334  WBC 8.5 6.1 5.1 4.5 4.7  HGB 8.3* 8.2* 8.3* 8.0* 8.0*  HCT 27.6* 27.0* 27.1* 25.8* 26.4*  MCV 90.8 90.9 91.2 89.6 90.1  PLT 75* 84* 87* 102* 373*   Basic Metabolic Panel: Recent Labs  Lab  07/27/20 0136 07/27/20 0254 07/29/20 0108 07/29/20 2139 07/30/20 0135 07/31/20 0139 07/31/20 2133 08/01/20 0458 08/02/20 0334  NA 139   < > 143 141 140 140 138 139 140  K 5.4*   < > 4.2 4.7 3.9 4.0 3.7 3.7 3.7  CL 107   < > 100 104 101 101 101 102 102  CO2 19*   < > 25 23 26 27 26 23 26   GLUCOSE 105*   < > 69* 82 86 116* 120* 102* 130*  BUN 52*   < > 61* 66* 66* 72* 75* 78* 82*  CREATININE 3.89*   < > 6.45* 7.12* 7.31* 8.07* 8.11* 8.44* 8.78*  CALCIUM 7.2*   < > 7.0* 6.9* 6.9* 7.3* 7.1* 7.2* 7.4*  MG 1.9  --  1.5* 1.9 2.0  --  1.9  --   --   PHOS 7.5*  --  5.3*  --  5.5* 6.0*  --  6.1* 6.9*   < > = values in this interval not displayed.   GFR: Estimated Creatinine Clearance: 8.4 mL/min (A) (by C-G formula based on SCr of 8.78 mg/dL (H)). Liver Function Tests: Recent Labs  Lab 07/26/20 1727 07/27/20 0136 07/28/20 0103 07/29/20 0108 07/30/20 0135 07/31/20 0139 08/01/20 0458 08/02/20 0334  AST 131* 231* 271* 233*  --   --   --   --   ALT 67* 86* 117* 126*  --   --   --   --   ALKPHOS 46 42 38 38  --   --   --   --   BILITOT 1.0 0.8 0.6 0.7  --   --   --   --   PROT 5.6* 5.7* 5.4* 5.1*  --   --   --   --   ALBUMIN 3.1* 3.4* 3.1* 2.8*  2.8* 3.0* 2.8* 2.5* 2.5*   No results for input(s): LIPASE, AMYLASE in the last 168 hours. No results for input(s): AMMONIA in the last 168 hours. Coagulation Profile: Recent Labs  Lab 07/26/20 1802  INR 1.7*   Cardiac Enzymes: Recent Labs  Lab 07/26/20 1727 07/26/20 2059 07/27/20 0136 07/27/20 1120 07/28/20 0103  CKTOTAL 4,945* 7,306* 11,849*  10,573* 12,472* 9,746*   BNP (last 3 results) No results for input(s): PROBNP in the last 8760 hours. HbA1C: No  results for input(s): HGBA1C in the last 72 hours. CBG: Recent Labs  Lab 08/01/20 0329 08/01/20 0740 08/01/20 1206 08/01/20 1613 08/01/20 2034  GLUCAP 100* 91 108* 102* 124*   Lipid Profile: No results for input(s): CHOL, HDL, LDLCALC, TRIG, CHOLHDL, LDLDIRECT in  the last 72 hours. Thyroid Function Tests: No results for input(s): TSH, T4TOTAL, FREET4, T3FREE, THYROIDAB in the last 72 hours. Anemia Panel: No results for input(s): VITAMINB12, FOLATE, FERRITIN, TIBC, IRON, RETICCTPCT in the last 72 hours. Sepsis Labs: Recent Labs  Lab 07/26/20 1727 07/26/20 2042 07/26/20 2206 07/27/20 0136 07/28/20 0103  PROCALCITON  --  26.97  --  39.52 61.41  LATICACIDVEN 1.3  --  1.3  --   --     Recent Results (from the past 240 hour(s))  Resp Panel by RT-PCR (Flu A&B, Covid) Nasopharyngeal Swab     Status: None   Collection Time: 07/26/20  5:27 PM   Specimen: Nasopharyngeal Swab; Nasopharyngeal(NP) swabs in vial transport medium  Result Value Ref Range Status   SARS Coronavirus 2 by RT PCR NEGATIVE NEGATIVE Final    Comment: (NOTE) SARS-CoV-2 target nucleic acids are NOT DETECTED.  The SARS-CoV-2 RNA is generally detectable in upper respiratory specimens during the acute phase of infection. The lowest concentration of SARS-CoV-2 viral copies this assay can detect is 138 copies/mL. A negative result does not preclude SARS-Cov-2 infection and should not be used as the sole basis for treatment or other patient management decisions. A negative result may occur with  improper specimen collection/handling, submission of specimen other than nasopharyngeal swab, presence of viral mutation(s) within the areas targeted by this assay, and inadequate number of viral copies(<138 copies/mL). A negative result must be combined with clinical observations, patient history, and epidemiological information. The expected result is Negative.  Fact Sheet for Patients:  EntrepreneurPulse.com.au  Fact Sheet for Healthcare Providers:  IncredibleEmployment.be  This test is no t yet approved or cleared by the [place] FDA and  has been authorized for detection and/or diagnosis of SARS-CoV-2 by FDA under an Emergency Use  Authorization (EUA). This EUA will remain  in effect (meaning this test can be used) for the duration of the COVID-19 declaration under Section 564(b)(1) of the Act, 21 U.S.C.section 360bbb-3(b)(1), unless the authorization is terminated  or revoked sooner.       Influenza A by PCR NEGATIVE NEGATIVE Final   Influenza B by PCR NEGATIVE NEGATIVE Final    Comment: (NOTE) The Xpert Xpress SARS-CoV-2/FLU/RSV plus assay is intended as an aid in the diagnosis of influenza from Nasopharyngeal swab specimens and should not be used as a sole basis for treatment. Nasal washings and aspirates are unacceptable for Xpert Xpress SARS-CoV-2/FLU/RSV testing.  Fact Sheet for Patients: EntrepreneurPulse.com.au  Fact Sheet for Healthcare Providers: IncredibleEmployment.be  This test is not yet approved or cleared by the [place] FDA and has been authorized for detection and/or diagnosis of SARS-CoV-2 by FDA under an Emergency Use Authorization (EUA). This EUA will remain in effect (meaning this test can be used) for the duration of the COVID-19 declaration under Section 564(b)(1) of the Act, 21 U.S.C. section 360bbb-3(b)(1), unless the authorization is terminated or revoked.  Performed at [facility], [place] [address], [place], [place] [address]   Blood Culture (routine x 2)     Status: None   Collection Time: 07/26/20  5:44 PM   Specimen: BLOOD LEFT ARM  Result Value Ref Range Status   Specimen Description BLOOD LEFT ARM  Final   Special Requests   Final    BOTTLES DRAWN AEROBIC AND ANAEROBIC Blood Culture results may not be optimal due to an inadequate volume of blood received in culture bottles   Culture   Final    NO GROWTH 5 DAYS Performed at [facility], [place] [address], [place], [place] [address]    Report Status 07/31/2020 FINAL  Final  Blood Culture (routine x 2)     Status: None   Collection Time: 07/26/20  6:25 PM   Specimen:  BLOOD LEFT ARM  Result Value Ref Range Status   Specimen Description BLOOD LEFT ARM  Final   Special Requests   Final    AEROBIC BOTTLE ONLY Blood Culture results may not be optimal due to an inadequate volume of blood received in culture bottles   Culture   Final    NO GROWTH 5 DAYS Performed at [facility], [place] [address], [place], [place] [address]    Report Status 07/31/2020 FINAL  Final  C Difficile Quick Screen w PCR reflex     Status: None   Collection Time: 07/26/20  8:54 PM   Specimen: STOOL  Result Value Ref Range Status   C Diff antigen NEGATIVE NEGATIVE Final   C Diff toxin NEGATIVE NEGATIVE Final   C Diff interpretation No C. difficile detected.  Final    Comment: Performed at [facility], [place] [address], [place], [place] [address]  Urine culture     Status: None   Collection Time: 07/26/20  9:16 PM   Specimen: In/Out Cath Urine  Result Value Ref Range Status   Specimen Description IN/OUT CATH URINE  Final   Special Requests NONE  Final   Culture   Final    NO GROWTH Performed at [facility], [place] [address] [place], [place] [address]    Report Status 07/28/2020 FINAL  Final  Gastrointestinal Panel by PCR , Stool     Status: Abnormal   Collection Time: 07/27/20  3:06 AM   Specimen: Nasal Mucosa; Stool  Result Value Ref Range Status   Campylobacter species NOT DETECTED NOT DETECTED Final   Plesimonas shigelloides NOT DETECTED NOT DETECTED Final   Salmonella species NOT DETECTED NOT DETECTED Final   Yersinia enterocolitica NOT DETECTED NOT DETECTED Final   Vibrio species NOT DETECTED NOT DETECTED Final   Vibrio cholerae NOT DETECTED NOT DETECTED Final   Enteroaggregative E coli (EAEC) NOT DETECTED NOT DETECTED Final   Enteropathogenic E coli (EPEC) DETECTED (A) NOT DETECTED Final    Comment: RESULT CALLED TO, READ BACK BY AND VERIFIED WITH: BRANDY TURNER 07/27/20 1708 KLW    Enterotoxigenic E coli (ETEC) NOT DETECTED NOT DETECTED Final    Shiga like toxin producing E coli (STEC) NOT DETECTED NOT DETECTED Final   Shigella/Enteroinvasive E coli (EIEC) NOT DETECTED NOT DETECTED Final   Cryptosporidium NOT DETECTED NOT DETECTED Final   Cyclospora cayetanensis NOT DETECTED NOT DETECTED Final   Entamoeba histolytica NOT DETECTED NOT DETECTED Final   Giardia lamblia NOT DETECTED NOT DETECTED Final   Adenovirus F40/41 NOT DETECTED NOT DETECTED Final   Astrovirus NOT DETECTED NOT DETECTED Final   Norovirus GI/GII NOT DETECTED NOT DETECTED Final   Rotavirus A DETECTED (A) NOT DETECTED Final    Comment: RESULT CALLED TO, READ BACK BY AND VERIFIED WITH: BRANDY TURNER 07/27/20 1708 KLW    Sapovirus (I, II, IV, and V) NOT DETECTED NOT DETECTED Final  Comment: Performed at [facility], [address] [place], [place] [address]  MRSA PCR Screening     Status: None   Collection Time: 07/27/20  7:21 PM   Specimen: Nasal Mucosa; Nasopharyngeal  Result Value Ref Range Status   MRSA by PCR NEGATIVE NEGATIVE Final    Comment:        The GeneXpert MRSA Assay (FDA approved for NASAL specimens only), is one component of a comprehensive MRSA colonization surveillance program. It is not intended to diagnose MRSA infection nor to guide or monitor treatment for MRSA infections. Performed at [facility], [place] [address], [place], [place] [address]          Radiology Studies: No results found.      Scheduled Meds: . apixaban  2.5 mg Oral BID  . budesonide (PULMICORT) nebulizer solution  0.5 mg Nebulization BID  . chlorhexidine  15 mL Mouth Rinse BID  . Chlorhexidine Gluconate Cloth  6 each Topical Q0600  . donepezil  5 mg Oral QHS  . ipratropium-albuterol  3 mL Nebulization Q6H  . mouth rinse  15 mL Mouth Rinse q12n4p  . melatonin  5 mg Oral QHS  . memantine  5 mg Oral BID  . pantoprazole  40 mg Oral Daily   Continuous Infusions: . sodium chloride 10 mL/hr at 07/30/20 1800  . lactated ringers 10 mL/hr at  07/30/20 1800          Demitrios Molyneux Starla Link, MD [facility] 08/02/2020, 7:43 AM

## 2020-08-02 NOTE — Progress Notes (Signed)
Patient ID: Todd Lloyd, male   DOB: 1940/12/15, 80 y.o.   MRN: [id number] [facility] Progress Note   Assessment/ Plan:   1. Acute kidney Injury: Nonoliguric overnight with 600 cc urine output.  Etiology suspected to be from ATN and rhabdomyolysis.  BUN and creatinine continue to trend up slowly but without any acute indications for dialysis.  He does not appear to be having any significant uremic signs or symptoms and does not have any critical electrolyte abnormality.  Given his current volume status and improving urine output, I will attempt to augment urine output with diuresis. 2.  Sepsis: Earlier with septic shock in the setting of rotavirus and EPEC diarrhea treated with broad-spectrum antibiotics and intravenous fluids.  He was also empirically treated earlier for suspected meningitis due to his encephalopathy.  Blood and urine culture so far negative then stool testing negative for C. difficile infection. 3.  Metabolic encephalopathy: With underlying dementia and suspected to be toxic metabolic encephalopathy from his earlier presentation with shock and possibly now with acute kidney injury/azotemia. 4.  Acute hypoxic respiratory failure: With history of chronic obstructive lung disease and stage I right lung cancer status post radiation.  Remains on supplemental oxygenation. 5.  Anemia of chronic disease: Without overt blood loss, continue to follow H&H.  Subjective:   Reports to be feeling fair and inquires about ways that he can improve his renal function.   Objective:   BP 125/69 (BP Location: Right Arm)   Pulse 84   Temp 97.6 F (36.4 C)   Resp 18   Ht 6' (1.829 m)   Wt 105.6 kg   SpO2 98%   BMI 31.59 kg/m   Intake/Output Summary (Last 24 hours) at 08/02/2020 0854 Last data filed at 08/02/2020 0800 Gross per 24 hour  Intake 1360 ml  Output 600 ml  Net 760 ml   Weight change:   Physical Exam: Gen: Comfortably sitting up in bed, eating  breakfast CVS: Pulse regular rhythm, normal rate, S1 and S2 normal Resp: Distant breath sounds bilaterally with fine rales left base Abd: Soft, obese, nontender Ext: 2+ pitting lower extremity edema  Imaging: No results found.  Labs: BMET Recent Labs  Lab 07/27/20 0136 07/27/20 0254 07/29/20 0108 07/29/20 2139 07/30/20 0135 07/31/20 0139 07/31/20 2133 08/01/20 0458 08/02/20 0334  NA 139   < > 143 141 140 140 138 139 140  K 5.4*   < > 4.2 4.7 3.9 4.0 3.7 3.7 3.7  CL 107   < > 100 104 101 101 101 102 102  CO2 19*   < > 25 23 26 27 26 23 26   GLUCOSE 105*   < > 69* 82 86 116* 120* 102* 130*  BUN 52*   < > 61* 66* 66* 72* 75* 78* 82*  CREATININE 3.89*   < > 6.45* 7.12* 7.31* 8.07* 8.11* 8.44* 8.78*  CALCIUM 7.2*   < > 7.0* 6.9* 6.9* 7.3* 7.1* 7.2* 7.4*  PHOS 7.5*  --  5.3*  --  5.5* 6.0*  --  6.1* 6.9*   < > = values in this interval not displayed.   CBC Recent Labs  Lab 07/30/20 0135 07/31/20 0139 08/01/20 0458 08/02/20 0334  WBC 6.1 5.1 4.5 4.7  HGB 8.2* 8.3* 8.0* 8.0*  HCT 27.0* 27.1* 25.8* 26.4*  MCV 90.9 91.2 89.6 90.1  PLT 84* 87* 102* 130*    Medications:    . apixaban  2.5 mg Oral BID  .  budesonide (PULMICORT) nebulizer solution  0.5 mg Nebulization BID  . chlorhexidine  15 mL Mouth Rinse BID  . Chlorhexidine Gluconate Cloth  6 each Topical Q0600  . donepezil  5 mg Oral QHS  . ipratropium-albuterol  3 mL Nebulization Q6H  . mouth rinse  15 mL Mouth Rinse q12n4p  . melatonin  5 mg Oral QHS  . memantine  5 mg Oral BID  . pantoprazole  40 mg Oral Daily   Elmarie Shiley, MD 08/02/2020, 8:54 AM

## 2020-08-02 NOTE — Progress Notes (Signed)
Physical Therapy Treatment Patient Details Name: Todd Lloyd MRN: [id number] DOB: 28-Mar-1941 Today's Date: 08/02/2020    History of Present Illness 80 yo admitted 3/7 after fall and remained on floor at home 6 hrs with rhabdomyolysis, acute respiratory failure, AMS, AKI. PMhx; COPD, lung CA, dementia, Afib, leukemia    PT Comments    Pt received in bed, soiled with BM but pt unaware. He required min guard assist bed mobility and min assist transfer to [facility] using RW. Pt with additional BM after transfer to [facility]. NT present in room and assisted with bathing and pericare. Pt very fatigued afterward and only tolerated ambulation BSC to recliner, 7' with RW min assist. Pt on 3L O2 throughout session. SpO2 97% at rest. Desat to 85% during acitivity. 1-minute seated recovery in recliner to return to 90%. Pt fatigues quickly. 2/4 DOE with minimal activity. Pt in recliner with feet elevated at end of session.    Follow Up Recommendations  SNF;Supervision/Assistance - 24 hour     Equipment Recommendations  Rolling walker with 5" wheels    Recommendations for Other Services       Precautions / Restrictions Precautions Precautions: Fall;Other (comment) Precaution Comments: watch sats    Mobility  Bed Mobility Overal bed mobility: Needs Assistance Bed Mobility: Supine to Sit     Supine to sit: Min guard;HOB elevated     General bed mobility comments: +rail, increased time, min guard for safety    Transfers Overall transfer level: Needs assistance Equipment used: Rolling walker (2 wheeled) Transfers: Sit to/from [facility] Sit to Stand: Min guard Stand pivot transfers: Min assist       General transfer comment: cues for hand placement and sequencing, increased time, assist to manage RW for pivot bed to [facility]  Ambulation/Gait Ambulation/Gait assistance: Min assist Gait Distance (Feet): 7 Feet Assistive device: Rolling walker (2 wheeled) Gait  Pattern/deviations: Step-through pattern;Decreased stride length Gait velocity: decreased   General Gait Details: Ambulated BSC to recliner with RW. Desat to 85% on 3L. 2/4 DOE with minimal activity. 1-minute seated recovery to return to 90%. Fatigues easily.   Stairs             Wheelchair Mobility    Modified Rankin (Stroke Patients Only)       Balance Overall balance assessment: Needs assistance;History of Falls   Sitting balance-Leahy Scale: Good     Standing balance support: Bilateral upper extremity supported;During functional activity Standing balance-Leahy Scale: Poor Standing balance comment: reliant on external support                            Cognition Arousal/Alertness: Awake/alert Behavior During Therapy: WFL for tasks assessed/performed Overall Cognitive Status: Impaired/Different from baseline Area of Impairment: Memory;Following commands;Safety/judgement;Problem solving                     Memory: Decreased short-term memory;Decreased recall of precautions Following Commands: Follows one step commands consistently Safety/Judgement: Decreased awareness of safety;Decreased awareness of deficits   Problem Solving: Slow processing;Requires verbal cues;Difficulty sequencing General Comments: confusion appears to be clearing      Exercises      General Comments General comments (skin integrity, edema, etc.): SpO2 975 at rest on 3L. Desat to 85% during mobility on 3L. 2/4 DOE with minimal activity. Fatigues quickly.      Pertinent Vitals/Pain Pain Assessment: Faces Faces Pain Scale: Hurts a little bit Pain Location: scrotum (severe edema)  Pain Descriptors / Indicators: Tender Pain Intervention(s): Monitored during session;Repositioned    Home Living                      Prior Function            PT Goals (current goals can now be found in the care plan section) Acute Rehab PT Goals Patient Stated Goal: return  home to care for my wife Progress towards PT goals: Progressing toward goals    Frequency    Min 2X/week      PT Plan Current plan remains appropriate    Co-evaluation              AM-PAC PT "6 Clicks" Mobility   Outcome Measure  Help needed turning from your back to your side while in a flat bed without using bedrails?: A Little Help needed moving from lying on your back to sitting on the side of a flat bed without using bedrails?: A Little Help needed moving to and from a bed to a chair (including a wheelchair)?: A Little Help needed standing up from a chair using your arms (e.g., wheelchair or bedside chair)?: A Little Help needed to walk in hospital room?: A Little Help needed climbing 3-5 steps with a railing? : A Lot 6 Click Score: 17    End of Session Equipment Utilized During Treatment: Gait belt;Oxygen Activity Tolerance: Patient tolerated treatment well Patient left: in chair;with call bell/phone within reach;with chair alarm set Nurse Communication: Mobility status PT Visit Diagnosis: Other abnormalities of gait and mobility (R26.89);Difficulty in walking, not elsewhere classified (R26.2);Muscle weakness (generalized) (M62.81)     Time: 0737-1062 PT Time Calculation (min) (ACUTE ONLY): 42 min  Charges:  $Gait Training: 23-37 mins $Therapeutic Activity: 8-22 mins                     Lorrin Goodell, PT  Office # [phone] Pager [phone]    Lorriane Shire 08/02/2020, 1:19 PM

## 2020-08-03 DIAGNOSIS — N179 Acute kidney failure, unspecified: Secondary | ICD-10-CM | POA: Diagnosis not present

## 2020-08-03 DIAGNOSIS — A419 Sepsis, unspecified organism: Secondary | ICD-10-CM | POA: Diagnosis not present

## 2020-08-03 DIAGNOSIS — J9602 Acute respiratory failure with hypercapnia: Secondary | ICD-10-CM | POA: Diagnosis not present

## 2020-08-03 DIAGNOSIS — R6521 Severe sepsis with septic shock: Secondary | ICD-10-CM | POA: Diagnosis not present

## 2020-08-03 LAB — RENAL FUNCTION PANEL
Albumin: 2.6 g/dL — ABNORMAL LOW (ref 3.5–5.0)
Anion gap: 12 (ref 5–15)
BUN: 85 mg/dL — ABNORMAL HIGH (ref 8–23)
CO2: 27 mmol/L (ref 22–32)
Calcium: 7.7 mg/dL — ABNORMAL LOW (ref 8.9–10.3)
Chloride: 103 mmol/L (ref 98–111)
Creatinine, Ser: 8.57 mg/dL — ABNORMAL HIGH (ref 0.61–1.24)
GFR, Estimated: 6 mL/min — ABNORMAL LOW (ref >60)
Glucose, Bld: 100 mg/dL — ABNORMAL HIGH (ref 70–99)
Phosphorus: 6.8 mg/dL — ABNORMAL HIGH (ref 2.5–4.6)
Potassium: 3.7 mmol/L (ref 3.5–5.1)
Sodium: 142 mmol/L (ref 135–145)

## 2020-08-03 LAB — CBC
HCT: 26 % — ABNORMAL LOW (ref 39.0–52.0)
Hemoglobin: 7.9 g/dL — ABNORMAL LOW (ref 13.0–17.0)
MCH: 27.2 pg (ref 26.0–34.0)
MCHC: 30.4 g/dL (ref 30.0–36.0)
MCV: 89.7 fL (ref 80.0–100.0)
Platelets: 140 10*3/uL — ABNORMAL LOW (ref 150–400)
RBC: 2.9 MIL/uL — ABNORMAL LOW (ref 4.22–5.81)
RDW: 19.7 % — ABNORMAL HIGH (ref 11.5–15.5)
WBC: 3.4 10*3/uL — ABNORMAL LOW (ref 4.0–10.5)
nRBC: 0 % (ref 0.0–0.2)

## 2020-08-03 LAB — GLUCOSE, CAPILLARY
Glucose-Capillary: 119 mg/dL — ABNORMAL HIGH (ref 70–99)
Glucose-Capillary: 119 mg/dL — ABNORMAL HIGH (ref 70–99)
Glucose-Capillary: 96 mg/dL (ref 70–99)
Glucose-Capillary: 104 mg/dL — ABNORMAL HIGH (ref 70–99)
Glucose-Capillary: 139 mg/dL — ABNORMAL HIGH (ref 70–99)

## 2020-08-03 MED ORDER — FUROSEMIDE 10 MG/ML IJ SOLN
80.0000 mg | Freq: Once | INTRAMUSCULAR | Status: AC
  Administered 2020-08-03: 80 mg via INTRAVENOUS
  Filled 2020-08-03: qty 8

## 2020-08-03 MED ORDER — ALBUMIN HUMAN 25 % IV SOLN
12.5000 g | Freq: Once | INTRAVENOUS | Status: AC
  Administered 2020-08-03: 12.5 g via INTRAVENOUS
  Filled 2020-08-03: qty 50

## 2020-08-03 NOTE — Plan of Care (Signed)

## 2020-08-03 NOTE — Progress Notes (Signed)
RT note. Pt. Placed on BIPAP at this time, VSS, pt. Resting comfortable. RT will continue to monitor

## 2020-08-03 NOTE — Progress Notes (Signed)
Patient ID: BLONG BUSK, male   DOB: August 10, 1940, 80 y.o.   MRN: [id number] [facility] Progress Note   Assessment/ Plan:   1. Acute kidney Injury: Nonoliguric overnight with 2 L urine output following furosemide challenge yesterday.  Etiology suspected to be from ATN and rhabdomyolysis.  BUN slightly higher with lower creatinine.  Given his current volume status and improving urine output, I will again attempt to augment urine output with diuresis. 2.  Sepsis: Earlier with septic shock in the setting of rotavirus and EPEC diarrhea treated with broad-spectrum antibiotics and intravenous fluids.  He was also empirically treated earlier for suspected meningitis due to his encephalopathy.  Blood and urine culture so far negative then stool testing negative for C. difficile infection. 3.  Metabolic encephalopathy: With underlying dementia and suspected to be toxic metabolic encephalopathy from his earlier presentation with shock and possibly now with acute kidney injury/azotemia. 4.  Acute hypoxic respiratory failure: With history of chronic obstructive lung disease and stage I right lung cancer status post radiation.  Remains on supplemental oxygenation. 5.  Anemia of chronic disease: Without overt blood loss, continue to follow H&H.  Subjective:   Reports to be feeling fair and expresses some frustration about still being in the hospital.   Objective:   BP (!) 148/64   Pulse 93   Temp 97.8 F (36.6 C) (Oral)   Resp 17   Ht 6' (1.829 m)   Wt 107.8 kg   SpO2 95%   BMI 32.24 kg/m   Intake/Output Summary (Last 24 hours) at 08/03/2020 0859 Last data filed at 08/03/2020 0410 Gross per 24 hour  Intake 720 ml  Output 2050 ml  Net -1330 ml   Weight change: 2.177 kg  Physical Exam: Gen: Comfortably sitting up in bed, eating breakfast CVS: Pulse regular rhythm, normal rate, S1 and S2 normal Resp: Distant breath sounds bilaterally with fine rales left base Abd: Soft, obese,  nontender Ext: 2+ pitting lower extremity edema  Imaging: No results found.  Labs: BMET Recent Labs  Lab 07/29/20 0108 07/29/20 2139 07/30/20 0135 07/31/20 0139 07/31/20 2133 08/01/20 0458 08/02/20 0334 08/03/20 0209  NA 143 141 140 140 138 139 140 142  K 4.2 4.7 3.9 4.0 3.7 3.7 3.7 3.7  CL 100 104 101 101 101 102 102 103  CO2 25 23 26 27 26 23 26 27   GLUCOSE 69* 82 86 116* 120* 102* 130* 100*  BUN 61* 66* 66* 72* 75* 78* 82* 85*  CREATININE 6.45* 7.12* 7.31* 8.07* 8.11* 8.44* 8.78* 8.57*  CALCIUM 7.0* 6.9* 6.9* 7.3* 7.1* 7.2* 7.4* 7.7*  PHOS 5.3*  --  5.5* 6.0*  --  6.1* 6.9* 6.8*   CBC Recent Labs  Lab 07/31/20 0139 08/01/20 0458 08/02/20 0334 08/03/20 0209  WBC 5.1 4.5 4.7 3.4*  HGB 8.3* 8.0* 8.0* 7.9*  HCT 27.1* 25.8* 26.4* 26.0*  MCV 91.2 89.6 90.1 89.7  PLT 87* 102* 130* 140*    Medications:    . apixaban  2.5 mg Oral BID  . budesonide (PULMICORT) nebulizer solution  0.5 mg Nebulization BID  . chlorhexidine  15 mL Mouth Rinse BID  . Chlorhexidine Gluconate Cloth  6 each Topical Q0600  . donepezil  5 mg Oral QHS  . mouth rinse  15 mL Mouth Rinse q12n4p  . melatonin  5 mg Oral QHS  . memantine  5 mg Oral BID  . pantoprazole  40 mg Oral Daily   Elmarie Shiley, MD 08/03/2020,  8:59 AM

## 2020-08-03 NOTE — Progress Notes (Signed)
Patient ID: Todd Lloyd, male   DOB: [birth year], 80 y.o.   MRN: [id number]  PROGRESS NOTE    BROADY LAFOY  GUY:[id number] DOB: 1941/01/15 DOA: 07/26/2020 PCP: Haywood Pao, MD   Brief Narrative:  80 year old male with history of COPD, stage I non-small cell lung cancer status post radiation in 04/2020, dementia, paroxysmal A. fib on Eliquis, GERD, leukemia status post bone marrow transplantation in 1990 was admitted to ICU under PCCM service with altered mental status after a fall and diarrhea.  He was found to be in shock/respiratory failure requiring BiPAP, acute kidney injury, rhabdomyolysis.  CT of the head and C-spine were negative for acute abnormalities.  CT of the abdomen and pelvis showed focal colitis without evidence of perforation.  Neurology and nephrology were consulted.  He was treated with bicarb drip.  EEG was negative for seizures.  He was treated with broad-spectrum antibiotics initially; stool was positive for rotavirus and enteropathogenic E. coli for which she was given a dose of Zithromax as well.  He was treated with intravenous fluids initially.  Urine output has been minimal and he has been getting intermittent Lasix recently by nephrology.  Mental status has improved; neurology signed off.  Patient was transferred to [facility] service on 07/31/2020.  Assessment & Plan:   Acute renal failure Rhabdomyolysis Acute metabolic acidosis: Acidosis has resolved -Likely from ATN from shock and rhabdomyolysis -Initially treated with IV fluids and then subsequently with intermittent Lasix.  Urine output is still not that good.  Nephrology following: Might need hemodialysis at some point but patient would want to avoid at this time.  Creatinine 8.57 today.  Patient was given a dose of IV albumin and Lasix by nephrology on 08/02/2020 with good urine output. -CK total was trending down -Renal ultrasound was negative for obstruction.  Avoid nephrotoxic medications.  Losartan and  diuretics on hold for now.  Septic shock: Present on admission; shock is resolved Rotavirus and enteropathogenic E. coli diarrhea -Patient was initially treated with IV fluids and broad-spectrum antibiotics.  Patient also received empiric coverage for meningitis because of altered mental status which were stopped on 07/27/2020.  Blood and urine cultures have been negative so far.  Covid/flu were negative on admission.  C. difficile testing was negative.  GI PCR was positive for rotavirus/enteropathogenic E. coli.  He received a dose of Zithromax on 07/28/2020 to cover enteropathogenic E. Coli. -Antibiotics have subsequently been discontinued. -Transferred to [facility] service on 07/31/2020. -Currently hemodynamically stable.  Acute metabolic encephalopathy History of dementia -Patient was encephalopathic probably secondary to shock/respiratory failure. -EEG was negative for seizures.  Neurology has signed off.  MRI has been deferred because of improvement in mental status. -Continue Namenda and Aricept -Fall precautions -PT is recommending SNF.  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  Currently on 4 L oxygen via nasal cannula and still requiring BiPAP at night.  Continue BiPAP at night and as needed for now.  PCCM has signed off -Echo showed EF of 55 to 60% -Continue nebs.  Currently not on any steroids.  Paroxysmal A. Fib -Rate controlled.  Continue low-dose Eliquis.  Digoxin on hold.  Outpatient follow-up with cardiology  Leukocytosis -Resolved -Now has leukopenia  Thrombocytopenia -Likely due to sepsis.  Platelets improving.  Monitor.  No signs of bleeding currently  Anemia of chronic disease -Probably from acute on chronic illnesses.  Hemoglobin stable.  Transfuse  if hemoglobin is less than 7  GERD -Continue PPI    DVT prophylaxis: Eliquis Code Status:  DNR Family Communication: None at bedside Disposition Plan: Status is: Inpatient  Remains inpatient appropriate because:Inpatient level of care appropriate due to severity of illness   Dispo: The patient is from: Home              Anticipated d/c is to: SNF              Patient currently is not medically stable to d/c.   Difficult to place patient No  Consultants: PCCM/nephrology  Procedures: Echo  Antimicrobials:  Anti-infectives (From admission, onward)   Start     Dose/Rate Route Frequency Ordered Stop   07/28/20 0845  azithromycin (ZITHROMAX) tablet 1,000 mg        1,000 mg Oral  Once 07/28/20 0747 07/28/20 0827   07/28/20 0608  cefTRIAXone (ROCEPHIN) 2 g in sodium chloride 0.9 % 100 mL IVPB  Status:  Discontinued        2 g 200 mL/hr over 30 Minutes Intravenous Every 24 hours 07/27/20 1447 07/28/20 0747   07/27/20 1045  metroNIDAZOLE (FLAGYL) IVPB 500 mg        500 mg 100 mL/hr over 60 Minutes Intravenous  Once 07/27/20 0956 07/27/20 1113   07/26/20 1930  acyclovir (ZOVIRAX) 775 mg in dextrose 5 % 150 mL IVPB  Status:  Discontinued        10 mg/kg  77.6 kg (Ideal) 165.5 mL/hr over 60 Minutes Intravenous Every 24 hours 07/26/20 1848 07/27/20 1424   07/26/20 1915  ampicillin (OMNIPEN) 2 g in sodium chloride 0.9 % 100 mL IVPB  Status:  Discontinued        2 g 300 mL/hr over 20 Minutes Intravenous Every 8 hours 07/26/20 1848 07/27/20 1424   07/26/20 1900  cefTRIAXone (ROCEPHIN) 2 g in sodium chloride 0.9 % 100 mL IVPB  Status:  Discontinued        2 g 200 mL/hr over 30 Minutes Intravenous Every 12 hours 07/26/20 1846 07/27/20 1447   07/26/20 1851  vancomycin variable dose per unstable renal function (pharmacist dosing)  Status:  Discontinued         Does not apply See admin instructions 07/26/20 1853 07/27/20 1424   07/26/20 1815  ceFEPIme (MAXIPIME) 2 g in sodium chloride 0.9 % 100 mL IVPB  Status:  Discontinued        2 g 200 mL/hr over 30 Minutes Intravenous  Once  07/26/20 1802 07/26/20 1846   07/26/20 1815  metroNIDAZOLE (FLAGYL) IVPB 500 mg        500 mg 100 mL/hr over 60 Minutes Intravenous  Once 07/26/20 1802 07/26/20 2049   07/26/20 1815  vancomycin (VANCOREADY) IVPB 1750 mg/350 mL        1,750 mg 175 mL/hr over 120 Minutes Intravenous  Once 07/26/20 1802 07/26/20 2228       Subjective: Patient seen and examined at bedside.  Still feels short of breath with exertion.  No overnight fever, vomiting or abdominal pain reported.   Objective: Vitals:   08/02/20 2159 08/02/20 2212 08/03/20 0409 08/03/20 0600  BP:  122/69 (!) 148/65   Pulse: 87 87 86   Resp: 15 15 17    Temp:  98 F (36.7 C) 97.8 F (36.6 C)   TempSrc:  Axillary Axillary   SpO2: 98% 98% 100%   Weight:    107.8 kg  Height:  Intake/Output Summary (Last 24 hours) at 08/03/2020 0744 Last data filed at 08/03/2020 0410 Gross per 24 hour  Intake 720 ml  Output 2450 ml  Net -1730 ml   Filed Weights   07/30/20 0332 08/01/20 0830 08/03/20 0600  Weight: 105.5 kg 105.6 kg 107.8 kg    Examination:  General exam: On 4 L oxygen via nasal cannula currently.  Required BiPAP overnight.  Looks very deconditioned and chronically ill.  No acute distress.  Poor historian. respiratory system: Decreased breath sounds at bases bilaterally with basilar crackles cardiovascular system: Rate controlled, S1-S2 heard Gastrointestinal system: Abdomen is still slightly distended, soft and nontender.  Bowel sounds are heard extremities: Lower extremities edema present bilaterally; no cyanosis Central nervous system: Awake, still very slow to respond to questions.  No focal neurological deficits.  Moving extremities skin: No obvious ecchymosis/lesions. psychiatry: Affect is flat  Data Reviewed: I have personally reviewed following labs and imaging studies  CBC: Recent Labs  Lab 07/30/20 0135 07/31/20 0139 08/01/20 0458 08/02/20 0334 08/03/20 0209  WBC 6.1 5.1 4.5 4.7 3.4*  HGB 8.2*  8.3* 8.0* 8.0* 7.9*  HCT 27.0* 27.1* 25.8* 26.4* 26.0*  MCV 90.9 91.2 89.6 90.1 89.7  PLT 84* 87* 102* 130* 025*   Basic Metabolic Panel: Recent Labs  Lab 07/29/20 0108 07/29/20 2139 07/30/20 0135 07/31/20 0139 07/31/20 2133 08/01/20 0458 08/02/20 0334 08/03/20 0209  NA 143 141 140 140 138 139 140 142  K 4.2 4.7 3.9 4.0 3.7 3.7 3.7 3.7  CL 100 104 101 101 101 102 102 103  CO2 25 23 26 27 26 23 26 27   GLUCOSE 69* 82 86 116* 120* 102* 130* 100*  BUN 61* 66* 66* 72* 75* 78* 82* 85*  CREATININE 6.45* 7.12* 7.31* 8.07* 8.11* 8.44* 8.78* 8.57*  CALCIUM 7.0* 6.9* 6.9* 7.3* 7.1* 7.2* 7.4* 7.7*  MG 1.5* 1.9 2.0  --  1.9  --   --   --   PHOS 5.3*  --  5.5* 6.0*  --  6.1* 6.9* 6.8*   GFR: Estimated Creatinine Clearance: 8.7 mL/min (A) (by C-G formula based on SCr of 8.57 mg/dL (H)). Liver Function Tests: Recent Labs  Lab 07/28/20 0103 07/29/20 0108 07/30/20 0135 07/31/20 0139 08/01/20 0458 08/02/20 0334 08/03/20 0209  AST 271* 233*  --   --   --   --   --   ALT 117* 126*  --   --   --   --   --   ALKPHOS 38 38  --   --   --   --   --   BILITOT 0.6 0.7  --   --   --   --   --   PROT 5.4* 5.1*  --   --   --   --   --   ALBUMIN 3.1* 2.8*  2.8* 3.0* 2.8* 2.5* 2.5* 2.6*   No results for input(s): LIPASE, AMYLASE in the last 168 hours. No results for input(s): AMMONIA in the last 168 hours. Coagulation Profile: No results for input(s): INR, PROTIME in the last 168 hours. Cardiac Enzymes: Recent Labs  Lab 07/27/20 1120 07/28/20 0103  CKTOTAL 12,472* 9,746*   BNP (last 3 results) No results for input(s): PROBNP in the last 8760 hours. HbA1C: No results for input(s): HGBA1C in the last 72 hours. CBG: Recent Labs  Lab 08/01/20 2034 08/02/20 0828 08/02/20 1213 08/02/20 1720 08/02/20 2113  GLUCAP 124* 99 116* 139* 144*   Lipid Profile:  No results for input(s): CHOL, HDL, LDLCALC, TRIG, CHOLHDL, LDLDIRECT in the last 72 hours. Thyroid Function Tests: No results for  input(s): TSH, T4TOTAL, FREET4, T3FREE, THYROIDAB in the last 72 hours. Anemia Panel: No results for input(s): VITAMINB12, FOLATE, FERRITIN, TIBC, IRON, RETICCTPCT in the last 72 hours. Sepsis Labs: Recent Labs  Lab 07/28/20 0103  PROCALCITON 61.41    Recent Results (from the past 240 hour(s))  Resp Panel by RT-PCR (Flu A&B, Covid) Nasopharyngeal Swab     Status: None   Collection Time: 07/26/20  5:27 PM   Specimen: Nasopharyngeal Swab; Nasopharyngeal(NP) swabs in vial transport medium  Result Value Ref Range Status   SARS Coronavirus 2 by RT PCR NEGATIVE NEGATIVE Final    Comment: (NOTE) SARS-CoV-2 target nucleic acids are NOT DETECTED.  The SARS-CoV-2 RNA is generally detectable in upper respiratory specimens during the acute phase of infection. The lowest concentration of SARS-CoV-2 viral copies this assay can detect is 138 copies/mL. A negative result does not preclude SARS-Cov-2 infection and should not be used as the sole basis for treatment or other patient management decisions. A negative result may occur with  improper specimen collection/handling, submission of specimen other than nasopharyngeal swab, presence of viral mutation(s) within the areas targeted by this assay, and inadequate number of viral copies(<138 copies/mL). A negative result must be combined with clinical observations, patient history, and epidemiological information. The expected result is Negative.  Fact Sheet for Patients:  EntrepreneurPulse.com.au  Fact Sheet for Healthcare Providers:  IncredibleEmployment.be  This test is no t yet approved or cleared by the [place] FDA and  has been authorized for detection and/or diagnosis of SARS-CoV-2 by FDA under an Emergency Use Authorization (EUA). This EUA will remain  in effect (meaning this test can be used) for the duration of the COVID-19 declaration under Section 564(b)(1) of the Act, 21 U.S.C.section  360bbb-3(b)(1), unless the authorization is terminated  or revoked sooner.       Influenza A by PCR NEGATIVE NEGATIVE Final   Influenza B by PCR NEGATIVE NEGATIVE Final    Comment: (NOTE) The Xpert Xpress SARS-CoV-2/FLU/RSV plus assay is intended as an aid in the diagnosis of influenza from Nasopharyngeal swab specimens and should not be used as a sole basis for treatment. Nasal washings and aspirates are unacceptable for Xpert Xpress SARS-CoV-2/FLU/RSV testing.  Fact Sheet for Patients: EntrepreneurPulse.com.au  Fact Sheet for Healthcare Providers: IncredibleEmployment.be  This test is not yet approved or cleared by the [place] FDA and has been authorized for detection and/or diagnosis of SARS-CoV-2 by FDA under an Emergency Use Authorization (EUA). This EUA will remain in effect (meaning this test can be used) for the duration of the COVID-19 declaration under Section 564(b)(1) of the Act, 21 U.S.C. section 360bbb-3(b)(1), unless the authorization is terminated or revoked.  Performed at [facility], [place] [address], [place], [place] [address]   Blood Culture (routine x 2)     Status: None   Collection Time: 07/26/20  5:44 PM   Specimen: BLOOD LEFT ARM  Result Value Ref Range Status   Specimen Description BLOOD LEFT ARM  Final   Special Requests   Final    BOTTLES DRAWN AEROBIC AND ANAEROBIC Blood Culture results may not be optimal due to an inadequate volume of blood received in culture bottles   Culture   Final    NO GROWTH 5 DAYS Performed at [facility], [place] [address] [place], [place] [address]  Report Status 07/31/2020 FINAL  Final  Blood Culture (routine x 2)     Status: None   Collection Time: 07/26/20  6:25 PM   Specimen: BLOOD LEFT ARM  Result Value Ref Range Status   Specimen Description BLOOD LEFT ARM  Final   Special Requests   Final    AEROBIC BOTTLE ONLY Blood Culture results may not be optimal  due to an inadequate volume of blood received in culture bottles   Culture   Final    NO GROWTH 5 DAYS Performed at [facility], [place] [address] [place], [place] [address]    Report Status 07/31/2020 FINAL  Final  C Difficile Quick Screen w PCR reflex     Status: None   Collection Time: 07/26/20  8:54 PM   Specimen: STOOL  Result Value Ref Range Status   C Diff antigen NEGATIVE NEGATIVE Final   C Diff toxin NEGATIVE NEGATIVE Final   C Diff interpretation No C. difficile detected.  Final    Comment: Performed at [facility], [place] [address], [place], [place] [address]  Urine culture     Status: None   Collection Time: 07/26/20  9:16 PM   Specimen: In/Out Cath Urine  Result Value Ref Range Status   Specimen Description IN/OUT CATH URINE  Final   Special Requests NONE  Final   Culture   Final    NO GROWTH Performed at [facility], [place] [address], [place], [place] [address]    Report Status 07/28/2020 FINAL  Final  Gastrointestinal Panel by PCR , Stool     Status: Abnormal   Collection Time: 07/27/20  3:06 AM   Specimen: Nasal Mucosa; Stool  Result Value Ref Range Status   Campylobacter species NOT DETECTED NOT DETECTED Final   Plesimonas shigelloides NOT DETECTED NOT DETECTED Final   Salmonella species NOT DETECTED NOT DETECTED Final   Yersinia enterocolitica NOT DETECTED NOT DETECTED Final   Vibrio species NOT DETECTED NOT DETECTED Final   Vibrio cholerae NOT DETECTED NOT DETECTED Final   Enteroaggregative E coli (EAEC) NOT DETECTED NOT DETECTED Final   Enteropathogenic E coli (EPEC) DETECTED (A) NOT DETECTED Final    Comment: RESULT CALLED TO, READ BACK BY AND VERIFIED WITH: BRANDY TURNER 07/27/20 1708 KLW    Enterotoxigenic E coli (ETEC) NOT DETECTED NOT DETECTED Final   Shiga like toxin producing E coli (STEC) NOT DETECTED NOT DETECTED Final   Shigella/Enteroinvasive E coli (EIEC) NOT DETECTED NOT DETECTED Final   Cryptosporidium NOT DETECTED NOT DETECTED  Final   Cyclospora cayetanensis NOT DETECTED NOT DETECTED Final   Entamoeba histolytica NOT DETECTED NOT DETECTED Final   Giardia lamblia NOT DETECTED NOT DETECTED Final   Adenovirus F40/41 NOT DETECTED NOT DETECTED Final   Astrovirus NOT DETECTED NOT DETECTED Final   Norovirus GI/GII NOT DETECTED NOT DETECTED Final   Rotavirus A DETECTED (A) NOT DETECTED Final    Comment: RESULT CALLED TO, READ BACK BY AND VERIFIED WITH: BRANDY TURNER 07/27/20 1708 KLW    Sapovirus (I, II, IV, and V) NOT DETECTED NOT DETECTED Final    Comment: Performed at [facility], [place] [place], [place] [address]  MRSA PCR Screening     Status: None   Collection Time: 07/27/20  7:21 PM   Specimen: Nasal Mucosa; Nasopharyngeal  Result Value Ref Range Status   MRSA by PCR NEGATIVE NEGATIVE Final    Comment:  The GeneXpert MRSA Assay (FDA approved for NASAL specimens only), is one component of a comprehensive MRSA colonization surveillance program. It is not intended to diagnose MRSA infection nor to guide or monitor treatment for MRSA infections. Performed at [facility], [place] [address], [place], [place] [address]          Radiology Studies: No results found.      Scheduled Meds: . apixaban  2.5 mg Oral BID  . budesonide (PULMICORT) nebulizer solution  0.5 mg Nebulization BID  . chlorhexidine  15 mL Mouth Rinse BID  . Chlorhexidine Gluconate Cloth  6 each Topical Q0600  . donepezil  5 mg Oral QHS  . mouth rinse  15 mL Mouth Rinse q12n4p  . melatonin  5 mg Oral QHS  . memantine  5 mg Oral BID  . pantoprazole  40 mg Oral Daily   Continuous Infusions: . sodium chloride 10 mL/hr at 07/30/20 1800  . lactated ringers 10 mL/hr at 07/30/20 1800          Kshitiz Starla Link, MD [facility] 08/03/2020, 7:44 AM

## 2020-08-04 DIAGNOSIS — N179 Acute kidney failure, unspecified: Secondary | ICD-10-CM | POA: Diagnosis not present

## 2020-08-04 DIAGNOSIS — J9602 Acute respiratory failure with hypercapnia: Secondary | ICD-10-CM | POA: Diagnosis not present

## 2020-08-04 DIAGNOSIS — Z7901 Long term (current) use of anticoagulants: Secondary | ICD-10-CM | POA: Diagnosis not present

## 2020-08-04 LAB — RENAL FUNCTION PANEL
Albumin: 2.8 g/dL — ABNORMAL LOW (ref 3.5–5.0)
Anion gap: 11 (ref 5–15)
BUN: 86 mg/dL — ABNORMAL HIGH (ref 8–23)
CO2: 29 mmol/L (ref 22–32)
Calcium: 7.9 mg/dL — ABNORMAL LOW (ref 8.9–10.3)
Chloride: 103 mmol/L (ref 98–111)
Creatinine, Ser: 7.36 mg/dL — ABNORMAL HIGH (ref 0.61–1.24)
GFR, Estimated: 7 mL/min — ABNORMAL LOW (ref >60)
Glucose, Bld: 112 mg/dL — ABNORMAL HIGH (ref 70–99)
Phosphorus: 6.6 mg/dL — ABNORMAL HIGH (ref 2.5–4.6)
Potassium: 3.9 mmol/L (ref 3.5–5.1)
Sodium: 143 mmol/L (ref 135–145)

## 2020-08-04 LAB — CBC
HCT: 26.2 % — ABNORMAL LOW (ref 39.0–52.0)
Hemoglobin: 8.1 g/dL — ABNORMAL LOW (ref 13.0–17.0)
MCH: 27.9 pg (ref 26.0–34.0)
MCHC: 30.9 g/dL (ref 30.0–36.0)
MCV: 90.3 fL (ref 80.0–100.0)
Platelets: 179 10*3/uL (ref 150–400)
RBC: 2.9 MIL/uL — ABNORMAL LOW (ref 4.22–5.81)
RDW: 19.9 % — ABNORMAL HIGH (ref 11.5–15.5)
WBC: 3.4 10*3/uL — ABNORMAL LOW (ref 4.0–10.5)
nRBC: 0 % (ref 0.0–0.2)

## 2020-08-04 LAB — GLUCOSE, CAPILLARY
Glucose-Capillary: 93 mg/dL (ref 70–99)
Glucose-Capillary: 127 mg/dL — ABNORMAL HIGH (ref 70–99)
Glucose-Capillary: 142 mg/dL — ABNORMAL HIGH (ref 70–99)
Glucose-Capillary: 144 mg/dL — ABNORMAL HIGH (ref 70–99)

## 2020-08-04 LAB — IRON AND TIBC
Iron: 86 ug/dL (ref 45–182)
Saturation Ratios: 35 % (ref 17.9–39.5)
TIBC: 246 ug/dL — ABNORMAL LOW (ref 250–450)
UIBC: 160 ug/dL

## 2020-08-04 LAB — RETICULOCYTES
Immature Retic Fract: 12.8 % (ref 2.3–15.9)
RBC.: 2.88 MIL/uL — ABNORMAL LOW (ref 4.22–5.81)
Retic Count, Absolute: 23 10*3/uL (ref 19.0–186.0)
Retic Ct Pct: 0.8 % (ref 0.4–3.1)

## 2020-08-04 LAB — FERRITIN: Ferritin: 65 ng/mL (ref 24–336)

## 2020-08-04 MED ORDER — ALBUMIN HUMAN 25 % IV SOLN
12.5000 g | Freq: Once | INTRAVENOUS | Status: AC
  Administered 2020-08-04: 12.5 g via INTRAVENOUS
  Filled 2020-08-04: qty 50

## 2020-08-04 MED ORDER — FUROSEMIDE 10 MG/ML IJ SOLN
80.0000 mg | Freq: Once | INTRAMUSCULAR | Status: AC
  Administered 2020-08-04: 80 mg via INTRAVENOUS
  Filled 2020-08-04: qty 8

## 2020-08-04 NOTE — TOC Progression Note (Addendum)
Transition of Care [facility]) - Progression Note    Patient Details  Name: Todd Lloyd MRN: [id number] Date of Birth: 06-27-1940  Transition of Care [facility]) CM/SW [place], RN Phone Number: [phone]  08/04/2020, 2:21 PM  Clinical Narrative:    TOC following for placement at [facility]. Patient is not medically stable for d/c. CM spoke with daughter Todd Lloyd who confirms that bed has been accepted at [facility]. CM discussed long term plan of care with daughter. Daughter states that she is not able to provide long term care, her parents are not eligible for medicaid and she is unable to afford long term care. Daughter states that she is not sure of what the long term goal will look like for her father. Daughter states that her dad has served in [employer] previously and would like to know if patient is eligible for VA benefits. CM to call VA to determine if patient has VA benefits.   54 CM has called VA to determine patients eligible services. Message left, will await return call.      Expected Discharge Plan: [place] Barriers to Discharge: Continued Medical Work up  Expected Discharge Plan and Services Expected Discharge Plan: [place] Choice: [place] arrangements for the past 2 months: Single Family Home                                       Social Determinants of Health (SDOH) Interventions    Readmission Risk Interventions Readmission Risk Prevention Plan 08/04/2020  Transportation Screening Complete  PCP or Specialist Appt within 5-7 Days Complete  Home Care Screening Complete  Medication Review (RN CM) Referral to Pharmacy  Some recent data might be hidden

## 2020-08-04 NOTE — Progress Notes (Signed)
[facility]                                                                              Patient Demographics  Todd Lloyd, is a 80 y.o. male, DOB - [birth year], YSA:[id number]  Admit date - 07/26/2020   Admitting Physician Renee Pain, MD  Outpatient Primary MD for the patient is [name], [name], MD  Outpatient specialists:   LOS - 9  days   Medical records reviewed and are as summarized below:    Chief Complaint  Patient presents with  . Fall  . Altered Mental Status       Brief summary   80 year old male with history of COPD, stage I non-small cell lung cancer s/p radiation in 04/2020, dementia, paroxysmal A. fib on Eliquis, GERD, leukemia s/p bone marrow transplantation in 1990 was admitted to ICU under PCCM service with altered mental status after a fall and diarrhea.  He was found to be in shock/respiratory failure requiring BiPAP, acute kidney injury, rhabdomyolysis.  CT of the head and C-spine were negative for acute abnormalities.  CT of the abdomen and pelvis showed focal colitis without evidence of perforation.  Neurology and nephrology were consulted.  He was treated with bicarb drip.  EEG was negative for seizures.  He was treated with broad-spectrum antibiotics initially; stool was positive for rotavirus and enteropathogenic E. Coli, received Zithromax.  He was treated with intravenous fluids initially.  Urine output has been minimal and he has been getting intermittent Lasix recently by nephrology.  Mental status has improved; neurology signed off.  Patient was transferred to [facility] service on 07/31/2020.   Assessment & Plan    Principal Problem: Acute kidney injury: Likely secondary from ATN, septic shock and rhabdomyolysis -Initially treated with IV fluids, subsequently with intermittent Lasix.  Nonoliguric -Nephrology following, creatinine appears to be trending down, 7.36 today and hopefully indicated of renal recovery -CK has trended  down. -Renal ultrasound negative for obstruction, losartan on hold  Active problems Septic shock, present on admission.  Rotavirus and enteropathogenic E. coli diarrhea -Initially treated with aggressive IV fluid hydration, broad-spectrum antibiotics -Also received empiric antibiotics for meningitis due to altered mental status, DC'd on 07/27/2020. -Blood cultures, urine cultures negative, Covid/flu negative, C. difficile negative -GI PCR was positive for rotavirus/enteropathogenic E. coli, received a dose of Zithromax on 07/28/2020 -Antibiotics have been subsequently discontinued.  Currently hemodynamically stable  Acute metabolic encephalopathy superimposed on dementia -Likely encephalopathic secondary to septic shock and respiratory failure.  Neurology was consulted -EEG was negative for seizures.  MRI deferred due to improvement in mental status. -Continue Namenda, Aricept, fall precautions -PT recommended SNF  Acute respiratory failure with hypoxia, acute COPD exacerbation. -Currently stable, O2 sats 99% on 3 L via [place], wean as tolerated. -Continue BiPAP at night and as needed.  PCCM has signed off -2D echo showed EF of 55 to 60% -Continue nebs.  Currently no wheezing  History of stage I right lung cancer status post XRT -Outpatient follow-up with oncology  Paroxysmal atrial fibrillation -Rate controlled, digoxin on hold -Continue low-dose Eliquis, follow outpatient with cardiology  Leukocytosis -Likely secondary to septic shock,  now resolved  Thrombocytopenia -Likely due to septic shock, currently improved  Anemia of chronic disease and acute illness -Hemoglobin currently stable, H&H 8.1  GERD -Continue PPI  Obesity Estimated body mass index is 32.35 kg/m as calculated from the following:   Height as of this encounter: 6' (1.829 m).   Weight as of this encounter: 108.2 kg.  Code Status: DNR DVT Prophylaxis:  apixaban (ELIQUIS) tablet 2.5 mg Start: 07/30/20  1000 SCDs Start: 07/26/20 2041 apixaban (ELIQUIS) tablet 2.5 mg   Level of Care: Level of care: Progressive Family Communication: Discussed all imaging results, lab results, explained to the patient    Disposition Plan:     Status is: Inpatient  Remains inpatient appropriate because:Inpatient level of care appropriate due to severity of illness   Dispo: The patient is from: Home              Anticipated d/c is to: SNF              Patient currently is not medically stable to d/c.  Creatinine still very high, hopefully recovering   Difficult to place patient No      Time Spent in minutes 35 minutes  Procedures:  2D echo  Consultants:   Nephrology CCM  Antimicrobials:   Anti-infectives (From admission, onward)   Start     Dose/Rate Route Frequency Ordered Stop   07/28/20 0845  azithromycin (ZITHROMAX) tablet 1,000 mg        1,000 mg Oral  Once 07/28/20 0747 07/28/20 0827   07/28/20 0608  cefTRIAXone (ROCEPHIN) 2 g in sodium chloride 0.9 % 100 mL IVPB  Status:  Discontinued        2 g 200 mL/hr over 30 Minutes Intravenous Every 24 hours 07/27/20 1447 07/28/20 0747   07/27/20 1045  metroNIDAZOLE (FLAGYL) IVPB 500 mg        500 mg 100 mL/hr over 60 Minutes Intravenous  Once 07/27/20 0956 07/27/20 1113   07/26/20 1930  acyclovir (ZOVIRAX) 775 mg in dextrose 5 % 150 mL IVPB  Status:  Discontinued        10 mg/kg  77.6 kg (Ideal) 165.5 mL/hr over 60 Minutes Intravenous Every 24 hours 07/26/20 1848 07/27/20 1424   07/26/20 1915  ampicillin (OMNIPEN) 2 g in sodium chloride 0.9 % 100 mL IVPB  Status:  Discontinued        2 g 300 mL/hr over 20 Minutes Intravenous Every 8 hours 07/26/20 1848 07/27/20 1424   07/26/20 1900  cefTRIAXone (ROCEPHIN) 2 g in sodium chloride 0.9 % 100 mL IVPB  Status:  Discontinued        2 g 200 mL/hr over 30 Minutes Intravenous Every 12 hours 07/26/20 1846 07/27/20 1447   07/26/20 1851  vancomycin variable dose per unstable renal function  (pharmacist dosing)  Status:  Discontinued         Does not apply See admin instructions 07/26/20 1853 07/27/20 1424   07/26/20 1815  ceFEPIme (MAXIPIME) 2 g in sodium chloride 0.9 % 100 mL IVPB  Status:  Discontinued        2 g 200 mL/hr over 30 Minutes Intravenous  Once 07/26/20 1802 07/26/20 1846   07/26/20 1815  metroNIDAZOLE (FLAGYL) IVPB 500 mg        500 mg 100 mL/hr over 60 Minutes Intravenous  Once 07/26/20 1802 07/26/20 2049   07/26/20 1815  vancomycin (VANCOREADY) IVPB 1750 mg/350 mL        1,750 mg 175 mL/hr  over 120 Minutes Intravenous  Once 07/26/20 1802 07/26/20 2228          Medications  Scheduled Meds: . apixaban  2.5 mg Oral BID  . budesonide (PULMICORT) nebulizer solution  0.5 mg Nebulization BID  . chlorhexidine  15 mL Mouth Rinse BID  . Chlorhexidine Gluconate Cloth  6 each Topical Q0600  . donepezil  5 mg Oral QHS  . mouth rinse  15 mL Mouth Rinse q12n4p  . melatonin  5 mg Oral QHS  . memantine  5 mg Oral BID  . pantoprazole  40 mg Oral Daily   Continuous Infusions: . sodium chloride 10 mL/hr at 07/30/20 1800  . lactated ringers 10 mL/hr at 07/30/20 1800   PRN Meds:.acetaminophen, albuterol, Gerhardt's butt cream, loperamide, polyethylene glycol      Subjective:   Todd Lloyd was seen and examined today.  Still has some dyspnea with exertion, overnight no fevers, nausea vomiting or abdominal pain.  Patient denies dizziness, chest pain, new weakness, numbess, tingling. No acute events overnight.    Objective:   Vitals:   08/03/20 2330 08/04/20 0403 08/04/20 0717 08/04/20 0723  BP: (!) 122/55 140/62  140/85  Pulse: 88 84  84  Resp: 20 20 19 20   Temp: 97.9 F (36.6 C) 98.2 F (36.8 C)  98.7 F (37.1 C)  TempSrc: Axillary Oral  Oral  SpO2: 96% 94%  91%  Weight:  108.2 kg    Height:        Intake/Output Summary (Last 24 hours) at 08/04/2020 1113 Last data filed at 08/04/2020 3810 Gross per 24 hour  Intake 615 ml  Output 3550 ml   Net -2935 ml     Wt Readings from Last 3 Encounters:  08/04/20 108.2 kg  04/22/20 93.6 kg  04/19/20 93.9 kg     Exam  General: Alert and oriented x 3, NAD, deconditioned  Cardiovascular: S1 S2 auscultated, no murmurs, RRR  Respiratory: Decreased breath sound at the bases  Gastrointestinal: Soft, nontender, nondistended, + bowel sounds  Ext: + pedal edema bilaterally  Neuro: no new deficits  Musculoskeletal: No digital cyanosis, clubbing  Skin: No rashes  Psych: Normal affect and demeanor   Data Reviewed:  I have personally reviewed following labs and imaging studies  Micro Results Recent Results (from the past 240 hour(s))  Resp Panel by RT-PCR (Flu A&B, Covid) Nasopharyngeal Swab     Status: None   Collection Time: 07/26/20  5:27 PM   Specimen: Nasopharyngeal Swab; Nasopharyngeal(NP) swabs in vial transport medium  Result Value Ref Range Status   SARS Coronavirus 2 by RT PCR NEGATIVE NEGATIVE Final    Comment: (NOTE) SARS-CoV-2 target nucleic acids are NOT DETECTED.  The SARS-CoV-2 RNA is generally detectable in upper respiratory specimens during the acute phase of infection. The lowest concentration of SARS-CoV-2 viral copies this assay can detect is 138 copies/mL. A negative result does not preclude SARS-Cov-2 infection and should not be used as the sole basis for treatment or other patient management decisions. A negative result may occur with  improper specimen collection/handling, submission of specimen other than nasopharyngeal swab, presence of viral mutation(s) within the areas targeted by this assay, and inadequate number of viral copies(<138 copies/mL). A negative result must be combined with clinical observations, patient history, and epidemiological information. The expected result is Negative.  Fact Sheet for Patients:  EntrepreneurPulse.com.au  Fact Sheet for Healthcare Providers:   IncredibleEmployment.be  This test is no t yet approved or cleared by the  [place] Architectural technologist and  has been authorized for detection and/or diagnosis of SARS-CoV-2 by FDA under an Print production planner (EUA). This EUA will remain  in effect (meaning this test can be used) for the duration of the COVID-19 declaration under Section 564(b)(1) of the Act, 21 U.S.C.section 360bbb-3(b)(1), unless the authorization is terminated  or revoked sooner.       Influenza A by PCR NEGATIVE NEGATIVE Final   Influenza B by PCR NEGATIVE NEGATIVE Final    Comment: (NOTE) The Xpert Xpress SARS-CoV-2/FLU/RSV plus assay is intended as an aid in the diagnosis of influenza from Nasopharyngeal swab specimens and should not be used as a sole basis for treatment. Nasal washings and aspirates are unacceptable for Xpert Xpress SARS-CoV-2/FLU/RSV testing.  Fact Sheet for Patients: EntrepreneurPulse.com.au  Fact Sheet for Healthcare Providers: IncredibleEmployment.be  This test is not yet approved or cleared by the [place] FDA and has been authorized for detection and/or diagnosis of SARS-CoV-2 by FDA under an Emergency Use Authorization (EUA). This EUA will remain in effect (meaning this test can be used) for the duration of the COVID-19 declaration under Section 564(b)(1) of the Act, 21 U.S.C. section 360bbb-3(b)(1), unless the authorization is terminated or revoked.  Performed at [facility], [place] [address], [place], [place] [address]   Blood Culture (routine x 2)     Status: None   Collection Time: 07/26/20  5:44 PM   Specimen: BLOOD LEFT ARM  Result Value Ref Range Status   Specimen Description BLOOD LEFT ARM  Final   Special Requests   Final    BOTTLES DRAWN AEROBIC AND ANAEROBIC Blood Culture results may not be optimal due to an inadequate volume of blood received in culture bottles   Culture   Final    NO GROWTH 5  DAYS Performed at [facility], [place] [address], [place], [place] [address]    Report Status 07/31/2020 FINAL  Final  Blood Culture (routine x 2)     Status: None   Collection Time: 07/26/20  6:25 PM   Specimen: BLOOD LEFT ARM  Result Value Ref Range Status   Specimen Description BLOOD LEFT ARM  Final   Special Requests   Final    AEROBIC BOTTLE ONLY Blood Culture results may not be optimal due to an inadequate volume of blood received in culture bottles   Culture   Final    NO GROWTH 5 DAYS Performed at [facility], [place] [address], [place], [place] [address]    Report Status 07/31/2020 FINAL  Final  C Difficile Quick Screen w PCR reflex     Status: None   Collection Time: 07/26/20  8:54 PM   Specimen: STOOL  Result Value Ref Range Status   C Diff antigen NEGATIVE NEGATIVE Final   C Diff toxin NEGATIVE NEGATIVE Final   C Diff interpretation No C. difficile detected.  Final    Comment: Performed at [facility], [place] [address], [place],  [address]  Urine culture     Status: None   Collection Time: 07/26/20  9:16 PM   Specimen: In/Out Cath Urine  Result Value Ref Range Status   Specimen Description IN/OUT CATH URINE  Final   Special Requests NONE  Final   Culture   Final    NO GROWTH Performed at [facility], [place] [address] [place],  [address]    Report Status 07/28/2020 FINAL  Final  Gastrointestinal Panel by  PCR , Stool     Status: Abnormal   Collection Time: 07/27/20  3:06 AM   Specimen: Nasal Mucosa; Stool  Result Value Ref Range Status   Campylobacter species NOT DETECTED NOT DETECTED Final   Plesimonas shigelloides NOT DETECTED NOT DETECTED Final   Salmonella species NOT DETECTED NOT DETECTED Final   Yersinia enterocolitica NOT DETECTED NOT DETECTED Final   Vibrio species NOT DETECTED NOT DETECTED Final   Vibrio cholerae NOT DETECTED NOT DETECTED Final   Enteroaggregative E coli (EAEC) NOT DETECTED NOT DETECTED Final    Enteropathogenic E coli (EPEC) DETECTED (A) NOT DETECTED Final    Comment: RESULT CALLED TO, READ BACK BY AND VERIFIED WITH: BRANDY TURNER 07/27/20 1708 KLW    Enterotoxigenic E coli (ETEC) NOT DETECTED NOT DETECTED Final   Shiga like toxin producing E coli (STEC) NOT DETECTED NOT DETECTED Final   Shigella/Enteroinvasive E coli (EIEC) NOT DETECTED NOT DETECTED Final   Cryptosporidium NOT DETECTED NOT DETECTED Final   Cyclospora cayetanensis NOT DETECTED NOT DETECTED Final   Entamoeba histolytica NOT DETECTED NOT DETECTED Final   Giardia lamblia NOT DETECTED NOT DETECTED Final   Adenovirus F40/41 NOT DETECTED NOT DETECTED Final   Astrovirus NOT DETECTED NOT DETECTED Final   Norovirus GI/GII NOT DETECTED NOT DETECTED Final   Rotavirus A DETECTED (A) NOT DETECTED Final    Comment: RESULT CALLED TO, READ BACK BY AND VERIFIED WITH: BRANDY TURNER 07/27/20 1708 KLW    Sapovirus (I, II, IV, and V) NOT DETECTED NOT DETECTED Final    Comment: Performed at [facility], [place] [place], [place] [address]  MRSA PCR Screening     Status: None   Collection Time: 07/27/20  7:21 PM   Specimen: Nasal Mucosa; Nasopharyngeal  Result Value Ref Range Status   MRSA by PCR NEGATIVE NEGATIVE Final    Comment:        The GeneXpert MRSA Assay (FDA approved for NASAL specimens only), is one component of a comprehensive MRSA colonization surveillance program. It is not intended to diagnose MRSA infection nor to guide or monitor treatment for MRSA infections. Performed at [facility], [place] [address], [place], [place] [address]     Radiology Reports CT HEAD WO CONTRAST  Result Date: 07/26/2020 CLINICAL DATA:  Trauma. Unwitnessed fall. EXAM: CT HEAD WITHOUT CONTRAST CT CERVICAL SPINE WITHOUT CONTRAST TECHNIQUE: Multidetector CT imaging of the head and cervical spine was performed following the standard protocol without intravenous contrast. Multiplanar CT image reconstructions of the  cervical spine were also generated. COMPARISON:  CT cervical spine August 30, 2011. FINDINGS: CT HEAD FINDINGS Brain: No evidence of acute large vascular territory infarction, hemorrhage, hydrocephalus, extra-axial collection or mass lesion/mass effect. Mild for age scattered white matter hypodensities, most likely related to chronic microvascular ischemic disease. Mild generalized cerebral atrophy with ex vacuo ventricular dilation. Vascular: Calcific atherosclerosis. No hyperdense vessel identified. Skull: No acute fracture.  Left frontal scalp contusion Sinuses/Orbits: Visualized sinuses are clear. Bilateral scleral buckles. Other: No mastoid effusions. CT CERVICAL SPINE FINDINGS Alignment: Mild anterolisthesis of C4 on C5, favor degenerative given facet arthropathy at this level. Broad dextrocurvature of the cervical spine. Skull base and vertebrae: Vertebral body heights are maintained. No evidence of acute fracture. Soft tissues and spinal canal: No prevertebral fluid or swelling. No visible canal hematoma. Disc levels: Left eccentric degenerative disc disease in the lower cervical spine, greatest at C7-T1 with disc height loss and endplate sclerosis. Bulky facet hypertrophy at multiple  levels, greatest on the left at C4-C5 where there is likely at least moderate bony foraminal stenosis. Upper chest: Emphysema. Lung apices appear clear although evaluation is limited by motion. IMPRESSION: CT head: 1. No evidence of acute intracranial abnormality. 2. Left frontal scalp contusion without acute fracture. CT cervical spine: 1. No evidence of acute fracture. 2. Rotation of C1 on C2 is likely positional in the absence of a fixed torticollis. 3. Bulky facet hypertrophy at multiple levels, greatest on the left at C4-C5 where there is likely at least moderate bony foraminal stenosis. 4. Left eccentric degenerative disc disease in the lower cervical spine, greatest at C7-T1. Electronically Signed   By: Margaretha Sheffield  MD   On: 07/26/2020 17:59   CT CHEST W CONTRAST  Result Date: 07/07/2020 CLINICAL DATA:  Restaging non-small cell lung cancer. Status post radiation therapy. EXAM: CT CHEST WITH CONTRAST TECHNIQUE: Multidetector CT imaging of the chest was performed during intravenous contrast administration. CONTRAST:  26mL OMNIPAQUE IOHEXOL 300 MG/ML  SOLN COMPARISON:  Multiple prior CT scans and a PET scan from 02/24/2020 FINDINGS: Cardiovascular: The heart is within normal limits in size for age and stable. No pericardial effusion. Stable aortic and coronary artery calcifications. 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 The pulmonary arteries are enlarged suggesting pulmonary hypertension. Mediastinum/Nodes: Small scattered mediastinal and hilar lymph nodes are stable. No mass or overt adenopathy. The esophagus is grossly normal. Lungs/Pleura: The bilobed right lower lobe lung mass measures approximately 13.5 x 9 mm. It previously measured 20 x 13 mm. Surrounding interstitial changes are likely due to radiation. There is also a new right-sided pleural effusion which is small to moderate in size. No new worrisome pulmonary nodules to suggest pulmonary metastatic disease. A few tiny scattered stable subpleural nodules in the upper lung zones bilaterally are noted. Stable emphysematous changes and pulmonary scarring. Upper Abdomen: No significant upper abdominal findings. No worrisome hepatic lesions. There is a stable calcified lesion associated with the left adrenal gland possibly related to prior hemorrhage. Musculoskeletal: No significant bony findings. IMPRESSION: 1. Interval decrease in size of the bilobed right lower lobe lung mass suggesting a response to radiation treatment. 2. New small to moderate right-sided pleural effusion. 3. No new worrisome pulmonary nodules to suggest pulmonary metastatic disease. 4. Stable emphysematous changes and pulmonary scarring. 5. Stable scattered mediastinal and hilar lymph  nodes. No adenopathy. 6. No findings to suggest upper abdominal metastatic disease. Aortic Atherosclerosis (ICD10-I70.0) and Emphysema (ICD10-J43.9). Electronically Signed   By: Marijo Sanes M.D.   On: 07/07/2020 11:45   CT CERVICAL SPINE WO CONTRAST  Result Date: 07/26/2020 CLINICAL DATA:  Trauma. Unwitnessed fall. EXAM: CT HEAD WITHOUT CONTRAST CT CERVICAL SPINE WITHOUT CONTRAST TECHNIQUE: Multidetector CT imaging of the head and cervical spine was performed following the standard protocol without intravenous contrast. Multiplanar CT image reconstructions of the cervical spine were also generated. COMPARISON:  CT cervical spine August 30, 2011. FINDINGS: CT HEAD FINDINGS Brain: No evidence of acute large vascular territory infarction, hemorrhage, hydrocephalus, extra-axial collection or mass lesion/mass effect. Mild for age scattered white matter hypodensities, most likely related to chronic microvascular ischemic disease. Mild generalized cerebral atrophy with ex vacuo ventricular dilation. Vascular: Calcific atherosclerosis. No hyperdense vessel identified. Skull: No acute fracture.  Left frontal scalp contusion Sinuses/Orbits: Visualized sinuses are clear. Bilateral scleral buckles. Other: No mastoid effusions. CT CERVICAL SPINE FINDINGS Alignment: Mild anterolisthesis of C4 on C5, favor degenerative given facet arthropathy at this level. Broad dextrocurvature of the cervical  spine. Skull base and vertebrae: Vertebral body heights are maintained. No evidence of acute fracture. Soft tissues and spinal canal: No prevertebral fluid or swelling. No visible canal hematoma. Disc levels: Left eccentric degenerative disc disease in the lower cervical spine, greatest at C7-T1 with disc height loss and endplate sclerosis. Bulky facet hypertrophy at multiple levels, greatest on the left at C4-C5 where there is likely at least moderate bony foraminal stenosis. Upper chest: Emphysema. Lung apices appear clear although  evaluation is limited by motion. IMPRESSION: CT head: 1. No evidence of acute intracranial abnormality. 2. Left frontal scalp contusion without acute fracture. CT cervical spine: 1. No evidence of acute fracture. 2. Rotation of C1 on C2 is likely positional in the absence of a fixed torticollis. 3. Bulky facet hypertrophy at multiple levels, greatest on the left at C4-C5 where there is likely at least moderate bony foraminal stenosis. 4. Left eccentric degenerative disc disease in the lower cervical spine, greatest at C7-T1. Electronically Signed   By: Margaretha Sheffield MD   On: 07/26/2020 17:59   US RENAL  Result Date: 07/28/2020 CLINICAL DATA:  Acute kidney injury EXAM: RENAL / URINARY TRACT ULTRASOUND COMPLETE COMPARISON:  None. FINDINGS: Right Kidney: Renal measurements: 10 x 6.2 x 5.7 cm = volume: 184.4 mL. Echogenicity within normal limits. No mass or hydronephrosis visualized. Left Kidney: Renal measurements: 10.2 x 6.6 x 6 cm = volume: 211.7 mL. Echogenicity within normal limits. 1.8 cm cyst at the upper pole. No hydronephrosis visualized. Bladder: Decompressed and not visualized Other: None. IMPRESSION: No hydronephrosis. Electronically Signed   By: Macy Mis M.D.   On: 07/28/2020 16:23   DG Pelvis Portable  Result Date: 07/26/2020 CLINICAL DATA:  Recent fall with pelvic pain, initial encounter EXAM: PORTABLE PELVIS 1-2 VIEWS COMPARISON:  02/24/2020 FINDINGS: Pelvic ring is intact. No acute fracture or dislocation is noted. Postsurgical changes are seen. No soft tissue abnormality is noted. Degenerative changes of lumbar spine are noted as well. Left upper quadrant calcification is noted similar to that seen on prior PET-CT. IMPRESSION: No acute abnormality noted. Electronically Signed   By: Inez Catalina M.D.   On: 07/26/2020 17:55   DG Chest Port 1 View  Result Date: 07/30/2020 CLINICAL DATA:  Respiratory failure EXAM: PORTABLE CHEST 1 VIEW COMPARISON:  07/26/2020 FINDINGS: The lungs are  symmetrically well inflated. Minimal left basilar atelectasis. Mild coarsening of the pulmonary interstitium is again noted. No pneumothorax or pleural effusion. Cardiac size is mildly enlarged. Pulmonary vasculature is normal. No acute bone abnormality. IMPRESSION: No active disease.  Stable cardiomegaly. Electronically Signed   By: Fidela Salisbury MD   On: 07/30/2020 07:04   DG Chest Port 1 View  Result Date: 07/26/2020 CLINICAL DATA:  Recent fall EXAM: PORTABLE CHEST 1 VIEW COMPARISON:  07/06/2020 FINDINGS: Cardiac shadow is mildly enlarged but stable. Aortic calcifications are seen. Known right lower lobe mass lesion is not well appreciated on this exam. No infiltrate or sizable effusion is seen. No acute bony abnormality is noted. IMPRESSION: No acute abnormality noted. Electronically Signed   By: Inez Catalina M.D.   On: 07/26/2020 17:57   EEG adult  Result Date: 07/27/2020 Lora Havens, MD     07/27/2020 10:35 AM Patient Name: SHELDON SEM MRN: [id number] Epilepsy Attending: Lora Havens Referring Physician/Provider: Dr. Kerney Elbe Date: 07/27/2020 Duration: 23.04 mins  Patient history: 80yo M with ams. EEG to evaluate for seizure  Level of alertness: Awake  AEDs during EEG study: None  Technical aspects: This EEG study was done with scalp electrodes positioned according to the 10-20 International system of electrode placement. Electrical activity was acquired at a sampling rate of 500Hz  and reviewed with a high frequency filter of 70Hz  and a low frequency filter of 1Hz . EEG data were recorded continuously and digitally stored.  Description: EEG showed continuous generalized 4.5-5Hz  theta slowing. Hyperventilation and photic stimulation were not performed.    ABNORMALITY -Continuous slow, generalized  IMPRESSION: This study is suggestive of moderate diffuse encephalopathy, nonspecific etiology. No seizures or epileptiform discharges were seen throughout the recording.   Lora Havens   ECHOCARDIOGRAM LIMITED  Result Date: 07/27/2020    ECHOCARDIOGRAM LIMITED REPORT   Patient Name:   BERWYN BIGLEY Date of Exam: 07/27/2020 Medical Rec #:  [id number]          Height:       72.0 in Accession #:    [id number]         Weight:       214.5 lb Date of Birth:  1940/06/06          BSA:          2.195 m Patient Age:    83 years           BP:           105/57 mmHg Patient Gender: M                  HR:           77 bpm. Exam Location:  Inpatient Procedure: Limited Echo, Cardiac Doppler and Color Doppler Indications:    Congestive Heart Failure I50.9  History:        Patient has prior history of Echocardiogram examinations, most                 recent 03/03/2020. COPD, Arrythmias:Atrial Fibrillation; Risk                 Factors:Hypertension, Dyslipidemia and Former Smoker.  Sonographer:    Vickie Epley RDCS Referring Phys: [place]  1. Left ventricular ejection fraction, by estimation, is 55 to 60%. The left ventricle has normal function. Left ventricular diastolic function could not be evaluated.  2. The mitral valve is grossly normal. No evidence of mitral stenosis.  3. Tricuspid valve regurgitation is mild to moderate.  4. The aortic valve is grossly normal. Aortic valve regurgitation is trivial. No aortic stenosis is present.  5. There is moderately elevated pulmonary artery systolic pressure. FINDINGS  Left Ventricle: Left ventricular ejection fraction, by estimation, is 55 to 60%. The left ventricle has normal function. Left ventricular diastolic function could not be evaluated. Left ventricular diastolic function could not be evaluated due to atrial  fibrillation. Right Ventricle: There is moderately elevated pulmonary artery systolic pressure. The tricuspid regurgitant velocity is 2.77 m/s, and with an assumed right atrial pressure of 15 mmHg, the estimated right ventricular systolic pressure is 37.6 mmHg. Mitral Valve: The mitral valve is grossly normal. No  evidence of mitral valve stenosis. Tricuspid Valve: The tricuspid valve is normal in structure. Tricuspid valve regurgitation is mild to moderate. Aortic Valve: The aortic valve is grossly normal. Aortic valve regurgitation is trivial. No aortic stenosis is present. Pulmonic Valve: The pulmonic valve was normal in structure. Pulmonic valve regurgitation is trivial. Aorta: The aortic root and ascending aorta are structurally normal, with no evidence of dilitation. LEFT VENTRICLE PLAX 2D LVIDd:  5.00 cm LVIDs:         3.80 cm LV PW:         1.00 cm LV IVS:        1.00 cm LVOT diam:     2.40 cm LVOT Area:     4.52 cm  LV Volumes (MOD) LV vol d, MOD A2C: 112.0 ml LV vol d, MOD A4C: 101.0 ml LV vol s, MOD A2C: 56.8 ml LV vol s, MOD A4C: 55.4 ml LV SV MOD A2C:     55.2 ml LV SV MOD A4C:     101.0 ml LV SV MOD BP:      49.6 ml RIGHT VENTRICLE TAPSE (M-mode): 1.5 cm LEFT ATRIUM         Index LA diam:    5.10 cm 2.32 cm/m   AORTA Ao Root diam: 3.80 cm Ao Asc diam:  3.60 cm TRICUSPID VALVE TR Peak grad:   30.7 mmHg TR Vmax:        277.00 cm/s  SHUNTS Systemic Diam: 2.40 cm Mertie Moores MD Electronically signed by Mertie Moores MD Signature Date/Time: 07/27/2020/3:32:59 PM    Final    CT CHEST ABDOMEN PELVIS WO CONTRAST  Result Date: 07/26/2020 CLINICAL DATA:  Sepsis and leukocytosis, recent fall, history of lung carcinoma EXAM: CT CHEST, ABDOMEN AND PELVIS WITHOUT CONTRAST TECHNIQUE: Multidetector CT imaging of the chest, abdomen and pelvis was performed following the standard protocol without IV contrast. COMPARISON:  Chest x-ray and pelvis film from earlier in the same day., 07/06/2020 FINDINGS: CT CHEST FINDINGS Cardiovascular: Atherosclerotic calcifications of the aorta are noted without aneurysmal dilatation. No cardiac enlargement is seen. No pericardial effusion is noted. 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 Mediastinum/Nodes: Thoracic inlet is within normal limits. No sizable hilar or mediastinal  adenopathy is noted. The esophagus as visualized is within normal limits. Lungs/Pleura: Left lung is well aerated with mild emphysematous changes. No focal infiltrate or sizable effusion is seen. Previously seen bilobed right lower lobe mass lesion is again identified and stable. Small right-sided pleural effusion is noted stable from the prior exam. Musculoskeletal: Degenerative changes of the thoracic spine are noted. No acute rib abnormality is seen. CT ABDOMEN PELVIS FINDINGS Hepatobiliary: No focal liver abnormality is seen. No gallstones, gallbladder wall thickening, or biliary dilatation. Pancreas: Unremarkable. No pancreatic ductal dilatation or surrounding inflammatory changes. Spleen: Normal in size without focal abnormality. Adrenals/Urinary Tract: Adrenal glands again demonstrate calcification on the left consistent with prior adrenal hematoma. The overall appearance is stable. Right adrenal gland is within normal limits. Kidneys are well visualized bilaterally. Tiny nonobstructing left renal stones are noted. Ureters are within normal limits. Bladder is decompressed by Foley catheter. No obstructive changes are seen. Stomach/Bowel: Colon is well visualized with some fluid within. Considerable wall thickening is noted within the ascending colon new from prior PET-CT from 02/24/2020. These changes are consistent with focal colitis without evidence of perforation. Some pericolonic inflammatory changes are seen. Small bowel and stomach appear within normal limits. The appendix is not well visualized. Vascular/Lymphatic: Aortic atherosclerosis. No enlarged abdominal or pelvic lymph nodes. Reproductive: Prostate is unremarkable. Other: No abdominal wall hernia or abnormality. No abdominopelvic ascites. Musculoskeletal: No acute or significant osseous findings. IMPRESSION: CT of the chest: Stable right lower lobe mass lesion with associated right effusion similar to that seen on the prior exam. CT of the  abdomen and pelvis: Tiny nonobstructing left renal stone. Changes of ascending colitis without evidence of perforation or abscess formation. Stable  calcified left adrenal hematoma. Aortic Atherosclerosis (ICD10-I70.0) and Emphysema (ICD10-J43.9). Electronically Signed   By: Inez Catalina M.D.   On: 07/26/2020 23:08    Lab Data:  CBC: Recent Labs  Lab 07/31/20 0139 08/01/20 0458 08/02/20 0334 08/03/20 0209 08/04/20 0226  WBC 5.1 4.5 4.7 3.4* 3.4*  HGB 8.3* 8.0* 8.0* 7.9* 8.1*  HCT 27.1* 25.8* 26.4* 26.0* 26.2*  MCV 91.2 89.6 90.1 89.7 90.3  PLT 87* 102* 130* 140* 063   Basic Metabolic Panel: Recent Labs  Lab 07/29/20 0108 07/29/20 2139 07/30/20 0135 07/31/20 0139 07/31/20 2133 08/01/20 0458 08/02/20 0334 08/03/20 0209 08/04/20 0226  NA 143 141 140 140 138 139 140 142 143  K 4.2 4.7 3.9 4.0 3.7 3.7 3.7 3.7 3.9  CL 100 104 101 101 101 102 102 103 103  CO2 25 23 26 27 26 23 26 27 29   GLUCOSE 69* 82 86 116* 120* 102* 130* 100* 112*  BUN 61* 66* 66* 72* 75* 78* 82* 85* 86*  CREATININE 6.45* 7.12* 7.31* 8.07* 8.11* 8.44* 8.78* 8.57* 7.36*  CALCIUM 7.0* 6.9* 6.9* 7.3* 7.1* 7.2* 7.4* 7.7* 7.9*  MG 1.5* 1.9 2.0  --  1.9  --   --   --   --   PHOS 5.3*  --  5.5* 6.0*  --  6.1* 6.9* 6.8* 6.6*   GFR: Estimated Creatinine Clearance: 10.2 mL/min (A) (by C-G formula based on SCr of 7.36 mg/dL (H)). Liver Function Tests: Recent Labs  Lab 07/29/20 0108 07/30/20 0135 07/31/20 0139 08/01/20 0458 08/02/20 0334 08/03/20 0209 08/04/20 0226  AST 233*  --   --   --   --   --   --   ALT 126*  --   --   --   --   --   --   ALKPHOS 38  --   --   --   --   --   --   BILITOT 0.7  --   --   --   --   --   --   PROT 5.1*  --   --   --   --   --   --   ALBUMIN 2.8*  2.8*   < > 2.8* 2.5* 2.5* 2.6* 2.8*   < > = values in this interval not displayed.   No results for input(s): LIPASE, AMYLASE in the last 168 hours. No results for input(s): AMMONIA in the last 168 hours. Coagulation  Profile: No results for input(s): INR, PROTIME in the last 168 hours. Cardiac Enzymes: No results for input(s): CKTOTAL, CKMB, CKMBINDEX, TROPONINI in the last 168 hours. BNP (last 3 results) No results for input(s): PROBNP in the last 8760 hours. HbA1C: No results for input(s): HGBA1C in the last 72 hours. CBG: Recent Labs  Lab 08/03/20 1143 08/03/20 1613 08/03/20 2026 08/03/20 2328 08/04/20 0720  GLUCAP 119* 96 104* 139* 93   Lipid Profile: No results for input(s): CHOL, HDL, LDLCALC, TRIG, CHOLHDL, LDLDIRECT in the last 72 hours. Thyroid Function Tests: No results for input(s): TSH, T4TOTAL, FREET4, T3FREE, THYROIDAB in the last 72 hours. Anemia Panel: Recent Labs    08/04/20 0226  FERRITIN 65  TIBC 246*  IRON 86  RETICCTPCT 0.8   Urine analysis:    Component Value Date/Time   COLORURINE AMBER (A) 07/26/2020 2153   APPEARANCEUR CLOUDY (A) 07/26/2020 2153   LABSPEC 1.016 07/26/2020 2153   PHURINE 5.0 07/26/2020 2153   GLUCOSEU NEGATIVE 07/26/2020 2153  HGBUR LARGE (A) 07/26/2020 2153   BILIRUBINUR NEGATIVE 07/26/2020 2153   KETONESUR NEGATIVE 07/26/2020 2153   PROTEINUR 100 (A) 07/26/2020 2153   UROBILINOGEN 0.2 11/13/2013 1429   NITRITE NEGATIVE 07/26/2020 2153   LEUKOCYTESUR NEGATIVE 07/26/2020 2153     Ripudeep Rai M.D. [facility] 08/04/2020, 11:13 AM  Available via Epic secure chat 7am-7pm After 7 pm, please refer to night coverage provider listed on amion.

## 2020-08-04 NOTE — Progress Notes (Signed)
RT note.  Pt. Stated that he did not want to wear the BIPAP tonight when giving his breathing treatment. Told pt. To let his RN know if he decides  to change his mind, RN made aware. RT will continue to monitor.

## 2020-08-04 NOTE — Progress Notes (Signed)
Bipap kept alarming. Pt keeps panicking and taking it off. Pt calmed and reassured and placed back on 3l [place]. RT notified of change. 02 sats 91% on 3L [place]. Pt resting comfortably. Will continue to monitor pt.

## 2020-08-04 NOTE — Progress Notes (Signed)
Patient ID: Todd Lloyd, male   DOB: 01/23/1941, 80 y.o.   MRN: [id number] [facility] Progress Note   Assessment/ Plan:   1. Acute kidney Injury: Nonoliguric overnight with 3.6 L urine output following furosemide dose yesterday.  Etiology suspected to be from ATN and rhabdomyolysis.  Creatinine appears to be trending down and likely indicative of renal recovery-we will continue to augment urine output with diuresis.  The goal is to liberate him from supplemental oxygen. 2.  Sepsis: Earlier with septic shock in the setting of rotavirus and EPEC diarrhea treated with broad-spectrum antibiotics and intravenous fluids.  He was also empirically treated earlier for suspected meningitis due to his encephalopathy.  Blood and urine culture so far negative and stool testing negative for C. difficile infection. 3.  Metabolic encephalopathy: With underlying dementia and suspected to be toxic metabolic encephalopathy from his earlier presentation with shock and possibly now with acute kidney injury/azotemia. 4.  Acute hypoxic respiratory failure: With history of chronic obstructive lung disease and stage I right lung cancer status post radiation.  Remains on supplemental oxygenation with efforts at weaning/volume unloading. 5.  Anemia of chronic disease: Without overt blood loss, continue to follow H&H.  Subjective:   Reports to be feeling fair and denies any chest pain but has some shortness of breath with exertion.  Remains on supplemental oxygen.   Objective:   BP 140/85   Pulse 84   Temp 98.7 F (37.1 C) (Oral)   Resp (!) 30   Ht 6' (1.829 m)   Wt 108.2 kg   SpO2 91%   BMI 32.35 kg/m   Intake/Output Summary (Last 24 hours) at 08/04/2020 0804 Last data filed at 08/04/2020 9373 Gross per 24 hour  Intake 829.93 ml  Output 3650 ml  Net -2820.07 ml   Weight change: 0.38 kg  Physical Exam: Gen: Comfortably resting in bed, easy to awaken and engage in conversation CVS: Pulse  regular rhythm, normal rate, S1 and S2 normal Resp: Distant breath sounds bilaterally with fine rales left base Abd: Soft, obese, nontender Ext: 2+ pitting lower extremity edema  Imaging: No results found.  Labs: BMET Recent Labs  Lab 07/29/20 0108 07/29/20 2139 07/30/20 0135 07/31/20 0139 07/31/20 2133 08/01/20 0458 08/02/20 0334 08/03/20 0209 08/04/20 0226  NA 143   < > 140 140 138 139 140 142 143  K 4.2   < > 3.9 4.0 3.7 3.7 3.7 3.7 3.9  CL 100   < > 101 101 101 102 102 103 103  CO2 25   < > 26 27 26 23 26 27 29   GLUCOSE 69*   < > 86 116* 120* 102* 130* 100* 112*  BUN 61*   < > 66* 72* 75* 78* 82* 85* 86*  CREATININE 6.45*   < > 7.31* 8.07* 8.11* 8.44* 8.78* 8.57* 7.36*  CALCIUM 7.0*   < > 6.9* 7.3* 7.1* 7.2* 7.4* 7.7* 7.9*  PHOS 5.3*  --  5.5* 6.0*  --  6.1* 6.9* 6.8* 6.6*   < > = values in this interval not displayed.   CBC Recent Labs  Lab 08/01/20 0458 08/02/20 0334 08/03/20 0209 08/04/20 0226  WBC 4.5 4.7 3.4* 3.4*  HGB 8.0* 8.0* 7.9* 8.1*  HCT 25.8* 26.4* 26.0* 26.2*  MCV 89.6 90.1 89.7 90.3  PLT 102* 130* 140* 179    Medications:    . apixaban  2.5 mg Oral BID  . budesonide (PULMICORT) nebulizer solution  0.5 mg Nebulization BID  .  chlorhexidine  15 mL Mouth Rinse BID  . Chlorhexidine Gluconate Cloth  6 each Topical Q0600  . donepezil  5 mg Oral QHS  . mouth rinse  15 mL Mouth Rinse q12n4p  . melatonin  5 mg Oral QHS  . memantine  5 mg Oral BID  . pantoprazole  40 mg Oral Daily   Elmarie Shiley, MD 08/04/2020, 8:04 AM

## 2020-08-05 ENCOUNTER — Inpatient Hospital Stay (HOSPITAL_COMMUNITY): Payer: Medicare Other

## 2020-08-05 DIAGNOSIS — J9602 Acute respiratory failure with hypercapnia: Secondary | ICD-10-CM | POA: Diagnosis not present

## 2020-08-05 DIAGNOSIS — Z7901 Long term (current) use of anticoagulants: Secondary | ICD-10-CM | POA: Diagnosis not present

## 2020-08-05 DIAGNOSIS — N179 Acute kidney failure, unspecified: Secondary | ICD-10-CM | POA: Diagnosis not present

## 2020-08-05 LAB — RENAL FUNCTION PANEL
Albumin: 3 g/dL — ABNORMAL LOW (ref 3.5–5.0)
Anion gap: 12 (ref 5–15)
BUN: 85 mg/dL — ABNORMAL HIGH (ref 8–23)
CO2: 30 mmol/L (ref 22–32)
Calcium: 8 mg/dL — ABNORMAL LOW (ref 8.9–10.3)
Chloride: 102 mmol/L (ref 98–111)
Creatinine, Ser: 5.78 mg/dL — ABNORMAL HIGH (ref 0.61–1.24)
GFR, Estimated: 9 mL/min — ABNORMAL LOW (ref >60)
Glucose, Bld: 104 mg/dL — ABNORMAL HIGH (ref 70–99)
Phosphorus: 6.5 mg/dL — ABNORMAL HIGH (ref 2.5–4.6)
Potassium: 4 mmol/L (ref 3.5–5.1)
Sodium: 144 mmol/L (ref 135–145)

## 2020-08-05 LAB — BLOOD GAS, ARTERIAL
Acid-Base Excess: 6.4 mmol/L — ABNORMAL HIGH (ref 0.0–2.0)
Acid-Base Excess: 6.9 mmol/L — ABNORMAL HIGH (ref 0.0–2.0)
Bicarbonate: 32.7 mmol/L — ABNORMAL HIGH (ref 20.0–28.0)
Bicarbonate: 32.9 mmol/L — ABNORMAL HIGH (ref 20.0–28.0)
FIO2: 44
FIO2: 40
O2 Saturation: 98.9 %
O2 Saturation: 97 %
Patient temperature: 37
Patient temperature: 37
pCO2 arterial: 70.1 mmHg (ref 32.0–48.0)
pCO2 arterial: 66.7 mmHg (ref 32.0–48.0)
pH, Arterial: 7.291 — ABNORMAL LOW (ref 7.350–7.450)
pH, Arterial: 7.314 — ABNORMAL LOW (ref 7.350–7.450)
pO2, Arterial: 164 mmHg — ABNORMAL HIGH (ref 83.0–108.0)
pO2, Arterial: 95.3 mmHg (ref 83.0–108.0)

## 2020-08-05 LAB — CBC
HCT: 27.4 % — ABNORMAL LOW (ref 39.0–52.0)
Hemoglobin: 8.3 g/dL — ABNORMAL LOW (ref 13.0–17.0)
MCH: 27.9 pg (ref 26.0–34.0)
MCHC: 30.3 g/dL (ref 30.0–36.0)
MCV: 92.3 fL (ref 80.0–100.0)
Platelets: 218 10*3/uL (ref 150–400)
RBC: 2.97 MIL/uL — ABNORMAL LOW (ref 4.22–5.81)
RDW: 19.9 % — ABNORMAL HIGH (ref 11.5–15.5)
WBC: 5.2 10*3/uL (ref 4.0–10.5)
nRBC: 0 % (ref 0.0–0.2)

## 2020-08-05 LAB — BLOOD GAS, VENOUS
Acid-Base Excess: 6.4 mmol/L — ABNORMAL HIGH (ref 0.0–2.0)
Bicarbonate: 32.7 mmol/L — ABNORMAL HIGH (ref 20.0–28.0)
FIO2: 44
O2 Saturation: 98.9 %
Patient temperature: 37
pCO2, Ven: 70.1 mmHg (ref 44.0–60.0)
pH, Ven: 7.291 (ref 7.250–7.430)

## 2020-08-05 LAB — MAGNESIUM
Magnesium: 1.3 mg/dL — ABNORMAL LOW (ref 1.7–2.4)
Magnesium: 1.7 mg/dL (ref 1.7–2.4)

## 2020-08-05 LAB — GLUCOSE, CAPILLARY
Glucose-Capillary: 129 mg/dL — ABNORMAL HIGH (ref 70–99)
Glucose-Capillary: 167 mg/dL — ABNORMAL HIGH (ref 70–99)
Glucose-Capillary: 66 mg/dL — ABNORMAL LOW (ref 70–99)
Glucose-Capillary: 84 mg/dL (ref 70–99)
Glucose-Capillary: 91 mg/dL (ref 70–99)
Glucose-Capillary: 96 mg/dL (ref 70–99)
Glucose-Capillary: 99 mg/dL (ref 70–99)

## 2020-08-05 MED ORDER — MAGNESIUM SULFATE 2 GM/50ML IV SOLN
2.0000 g | Freq: Once | INTRAVENOUS | Status: AC
  Administered 2020-08-05: 2 g via INTRAVENOUS
  Filled 2020-08-05: qty 50

## 2020-08-05 MED ORDER — DEXTROSE 50 % IV SOLN
12.5000 g | INTRAVENOUS | Status: AC
  Administered 2020-08-05: 12.5 g via INTRAVENOUS
  Filled 2020-08-05: qty 50

## 2020-08-05 MED ORDER — LORAZEPAM 2 MG/ML IJ SOLN
1.0000 mg | Freq: Once | INTRAMUSCULAR | Status: AC
  Administered 2020-08-05: 1 mg via INTRAVENOUS
  Filled 2020-08-05: qty 1

## 2020-08-05 NOTE — Progress Notes (Signed)
Nurse noted new onset involuntary movements of body. She also noted expressive aphasia. Chart reviewed, and based on history labs were ordered to check for electrolytes, and ABG was ordered. The ABG really does show that he needs to be wearing his BiPAP (with elevated CO2), but patient continues to refuse the BiPAP through the night. Due to the expressive aphasia - CT head ordered to r/o acute changes. BMP and mag still pending.

## 2020-08-05 NOTE — Progress Notes (Signed)
Upon reassessment of pt, pt exhibiting jerking/ticking type movements all over and having moderate expressive aphasia. No changes to pt's orientation or mentation. Pt smile symmetrical and no hemiparesis observed. Blood glucose 99 and vital signs stable. Pt stating he can't breathe but 02 sats 99% on 3L [place].   12:39: RT notified to assess pt's breathing. Night hospitalist paged through [facility] and rapid repsonse nurse notified along with charge RN.   New orders placed by [facility]. Will continue to monitor pt.

## 2020-08-05 NOTE — TOC Progression Note (Signed)
Transition of Care [facility]) - Progression Note    Patient Details  Name: Todd Lloyd MRN: [id number] Date of Birth: 11/22/1940  Transition of Care [facility]) CM/SW [place], RN Phone Number: [phone]  08/05/2020, 10:07 AM  Clinical Narrative:    CM received return call from [place] at [place]. Per [facility] patient is not service connected so he has no long term care benefits and his short term care must be filed through [employer].    Expected Discharge Plan: [place] Barriers to Discharge: Continued Medical Work up  Expected Discharge Plan and Services Expected Discharge Plan: [place] Choice: [place] arrangements for the past 2 months: Single Family Home                                       Social Determinants of Health (SDOH) Interventions    Readmission Risk Interventions Readmission Risk Prevention Plan 08/04/2020  Transportation Screening Complete  PCP or Specialist Appt within 5-7 Days Complete  Home Care Screening Complete  Medication Review (RN CM) Referral to Pharmacy  Some recent data might be hidden

## 2020-08-05 NOTE — Progress Notes (Signed)
Patient ID: PARK BECK, male   DOB: June 14, 1940, 80 y.o.   MRN: [id number] [facility] Progress Note   Assessment/ Plan:   1. Acute kidney Injury: Nonoliguric overnight with 3.8L urine output following albumin/furosemide; continue to monitor urine output off of diuretics and restart oral furosemide 20 mg daily (home dose prior to admission) by mouth when mental status back to baseline.  Etiology suspected to be from ATN and rhabdomyolysis.  Labs show improving creatinine/renal recovery at this time.  He is scheduled for outpatient renal follow-up with Dr. Joylene Grapes on 4/14 (details entered into DC-section on chart).  Renal service will sign off at this time and remain available for questions or concerns. 2.  Sepsis: Earlier with septic shock in the setting of rotavirus and EPEC diarrhea treated with broad-spectrum antibiotics and intravenous fluids.  He was also empirically treated earlier for suspected meningitis due to his encephalopathy.  Blood and urine culture so far negative and stool testing negative for C. difficile infection. 3.  Myoclonic jerks/encephalopathy: With underlying dementia and acute neurological change overnight.  I have reviewed his medications and do not find any that came to explain his symptoms.  With recovering renal function, not indicative of uremia. 4.  Acute hypoxic respiratory failure: With history of chronic obstructive lung disease and stage I right lung cancer status post radiation.  Remains on supplemental oxygenation with efforts at weaning/volume unloading. 5.  Anemia of chronic disease: Without overt blood loss, continue to follow H&H.  Subjective:   With myoclonic jerks and expressive aphasia overnight/this morning.  CT scan of the head done earlier today shows no acute intracranial process.   Objective:   BP (!) 146/73 (BP Location: Right Arm)   Pulse 100   Temp 98.6 F (37 C) (Oral)   Resp 20   Ht 6' (1.829 m)   Wt 102.3 kg   SpO2 99%    BMI 30.60 kg/m   Intake/Output Summary (Last 24 hours) at 08/05/2020 0750 Last data filed at 08/05/2020 0534 Gross per 24 hour  Intake 0 ml  Output 3800 ml  Net -3800 ml   Weight change: -5.868 kg  Physical Exam: Gen: With myoclonic jerking of jaw/upper limbs, no verbal response to questions CVS: Pulse regular rhythm, normal rate, S1 and S2 normal Resp: Coarse breath sounds bilaterally without distinct rales or rhonchi Abd: Soft, obese, nontender Ext: 1+ pitting lower extremity edema  Imaging: CT HEAD WO CONTRAST  Result Date: 08/05/2020 CLINICAL DATA:  Expressive aphasia with stroke suspected EXAM: CT HEAD WITHOUT CONTRAST TECHNIQUE: Contiguous axial images were obtained from the base of the skull through the vertex without intravenous contrast. COMPARISON:  Ten days ago FINDINGS: Brain: No evidence of acute infarction, hemorrhage, hydrocephalus, extra-axial collection or mass lesion/mass effect. Generalized atrophy. Age normal appearance of white matter. Vascular: No hyperdense vessel or unexpected calcification. Skull: Normal. Negative for fracture or focal lesion. Sinuses/Orbits: No acute finding. Bilateral scleral banding and cataract resection. IMPRESSION: Stable head CT. No visible infarct or other change from recent comparison. Electronically Signed   By: Monte Fantasia M.D.   On: 08/05/2020 05:01    Labs: BMET Recent Labs  Lab 07/30/20 0135 07/31/20 0139 07/31/20 2133 08/01/20 0458 08/02/20 0334 08/03/20 0209 08/04/20 0226 08/05/20 0301  NA 140 140 138 139 140 142 143 144  K 3.9 4.0 3.7 3.7 3.7 3.7 3.9 4.0  CL 101 101 101 102 102 103 103 102  CO2 26 27 26 23 26 27 29  30  GLUCOSE 86 116* 120* 102* 130* 100* 112* 104*  BUN 66* 72* 75* 78* 82* 85* 86* 85*  CREATININE 7.31* 8.07* 8.11* 8.44* 8.78* 8.57* 7.36* 5.78*  CALCIUM 6.9* 7.3* 7.1* 7.2* 7.4* 7.7* 7.9* 8.0*  PHOS 5.5* 6.0*  --  6.1* 6.9* 6.8* 6.6* 6.5*   CBC Recent Labs  Lab 08/02/20 0334 08/03/20 0209  08/04/20 0226 08/05/20 0301  WBC 4.7 3.4* 3.4* 5.2  HGB 8.0* 7.9* 8.1* 8.3*  HCT 26.4* 26.0* 26.2* 27.4*  MCV 90.1 89.7 90.3 92.3  PLT 130* 140* 179 218    Medications:    . apixaban  2.5 mg Oral BID  . budesonide (PULMICORT) nebulizer solution  0.5 mg Nebulization BID  . chlorhexidine  15 mL Mouth Rinse BID  . Chlorhexidine Gluconate Cloth  6 each Topical Q0600  . donepezil  5 mg Oral QHS  . mouth rinse  15 mL Mouth Rinse q12n4p  . melatonin  5 mg Oral QHS  . memantine  5 mg Oral BID  . pantoprazole  40 mg Oral Daily   Elmarie Shiley, MD 08/05/2020, 7:50 AM

## 2020-08-05 NOTE — Progress Notes (Signed)
RT note. Pt. Still refusing BIPAP at this time, Arterial blood gas was drawn per RN. MD aware of what is going on at this time

## 2020-08-05 NOTE — Progress Notes (Signed)
PT Cancellation Note  Patient Details Name: Todd Lloyd MRN: [id number] DOB: 22-Jul-1940   Cancelled Treatment:    Reason Eval/Treat Not Completed: Medical issues which prohibited therapy. Pt with elevated Co2 and on biPAP. Rn advises waiting till ABG is taken again this PM. Will check back as time allows and when medically ready.    Allena Katz 08/05/2020, 11:43 AM

## 2020-08-05 NOTE — Progress Notes (Signed)
[facility]                                                                              Patient Demographics  Todd Lloyd, is a 80 y.o. male, DOB - 1941/02/21, YTK:[id number]  Admit date - 07/26/2020   Admitting Physician Renee Pain, MD  Outpatient Primary MD for the patient is [name], [name], MD  Outpatient specialists:   LOS - 10  days   Medical records reviewed and are as summarized below:    Chief Complaint  Patient presents with  . Fall  . Altered Mental Status       Brief summary   80 year old male with history of COPD, stage I non-small cell lung cancer s/p radiation in 04/2020, dementia, paroxysmal A. fib on Eliquis, GERD, leukemia s/p bone marrow transplantation in 1990 was admitted to ICU under PCCM service with altered mental status after a fall and diarrhea.  He was found to be in shock/respiratory failure requiring BiPAP, acute kidney injury, rhabdomyolysis.  CT of the head and C-spine were negative for acute abnormalities.  CT of the abdomen and pelvis showed focal colitis without evidence of perforation.  Neurology and nephrology were consulted.  He was treated with bicarb drip.  EEG was negative for seizures.  He was treated with broad-spectrum antibiotics initially; stool was positive for rotavirus and enteropathogenic E. Coli, received Zithromax.  He was treated with intravenous fluids initially.  Urine output has been minimal and he has been getting intermittent Lasix recently by nephrology.  Mental status has improved; neurology signed off.  Patient was transferred to [facility] service on 07/31/2020.   Assessment & Plan    Principal Problem: Acute kidney injury: Likely secondary from ATN, septic shock and rhabdomyolysis -Initially treated with IV fluids, subsequently with intermittent Lasix.  Nonoliguric -Nephrology following, creatinine appears to be trending down, 5.7 today and hopefully indicated of renal recovery -CK has trended  down. -Renal ultrasound negative for obstruction, losartan on hold  Active problems Acute respiratory failure with hypercarbia and hypoxia, acute COPD exacerbation -On examination, patient lethargic, not responding to verbal commands, ABG showed pH of 7.2, PCO2 70.1.  Overnight patient had refused BiPAP - Placed on BiPAP, will repeat ABG again in 2 hours.  DNR - 2D echo showed EF of 55 to 60%.  - Patient needs to have BiPAP QHS and PRN, however has been refusing.  Acute metabolic encephalopathy superimposed on dementia, myoclonic jerks  -Overnight noticed to have myoclonic jerks and acute neurological changes, also had similar episode in ICU.  Discussed in detail with the patient's daughter (RN), patient has a history of ' involuntary movements" and had been seeing Dr. [name] neurology outpatient.  Reviewed Dr. [name]' note, mentioned increased problems with restless leg syndrome.  Patient had undergone work-up with a EMG and nerve conduction studies.  He was recommended low-dose baclofen, Cymbalta and Mirapex 1 mg twice daily -Renal function recovering, hence likely uremia is not the cause.  Magnesium was low 1.3, replaced, repeat magnesium 1.7 -CT head negative for acute intracranial pathology -ABG showed respiratory acidosis with pH 7.2, PCO2 70.1, placed on BiPAP, will reassess -If  continues to have myoclonic jerks or slurred speech, despite improvement in respiratory acidosis and mental status, will consult neurology.   -Melatonin discontinued avoid sedating meds -Patient had EEG during admission, negative for seizures. MRI was initially deferred due to rapid improvement in his mental status, will order MRI once respiratory status is stable -Continue Namenda, Aricept, fall precautions  Septic shock, present on admission.  Rotavirus and enteropathogenic E. coli diarrhea -Initially treated with aggressive IV fluid hydration, broad-spectrum antibiotics -Also received empiric antibiotics for  meningitis due to altered mental status, DC'd on 07/27/2020. -Blood cultures, urine cultures negative, Covid/flu negative, C. difficile negative -GI PCR was positive for rotavirus/enteropathogenic E. coli, received a dose of Zithromax on 07/28/2020 -Antibiotics have been subsequently discontinued.  Currently hemodynamically stable   History of stage I right lung cancer status post XRT -Outpatient follow-up with oncology  Paroxysmal atrial fibrillation -Rate controlled, digoxin on hold -Continue low-dose Eliquis, follow outpatient with cardiology  Leukocytosis -Likely secondary to septic shock, resolved  Thrombocytopenia -Likely due to septic shock, currently improved  Anemia of chronic disease and acute illness -Hemoglobin currently stable, hemoglobin stable  GERD -Continue PPI  Obesity Estimated body mass index is 30.6 kg/m as calculated from the following:   Height as of this encounter: 6' (1.829 m).   Weight as of this encounter: 102.3 kg.  Code Status: DNR DVT Prophylaxis:  apixaban (ELIQUIS) tablet 2.5 mg Start: 07/30/20 1000 SCDs Start: 07/26/20 2041 apixaban (ELIQUIS) tablet 2.5 mg   Level of Care: Level of care: Progressive Family Communication: Discussed all imaging results, lab results, explained to the patient's daughter, [name] on the phone, # [phone]   Disposition Plan:     Status is: Inpatient  Remains inpatient appropriate because:Inpatient level of care appropriate due to severity of illness   Dispo: The patient is from: Home              Anticipated d/c is to: SNF              Patient currently is not medically stable to d/c.  Acute respiratory failure with hypercarbia, myoclonic jerks, renal function recovering, not safe for discharge   Difficult to place patient No      Time Spent in minutes 35 minutes  Procedures:  2D echo  Consultants:   Nephrology CCM  Antimicrobials:   Anti-infectives (From admission, onward)    Start     Dose/Rate Route Frequency Ordered Stop   07/28/20 0845  azithromycin (ZITHROMAX) tablet 1,000 mg        1,000 mg Oral  Once 07/28/20 0747 07/28/20 0827   07/28/20 0608  cefTRIAXone (ROCEPHIN) 2 g in sodium chloride 0.9 % 100 mL IVPB  Status:  Discontinued        2 g 200 mL/hr over 30 Minutes Intravenous Every 24 hours 07/27/20 1447 07/28/20 0747   07/27/20 1045  metroNIDAZOLE (FLAGYL) IVPB 500 mg        500 mg 100 mL/hr over 60 Minutes Intravenous  Once 07/27/20 0956 07/27/20 1113   07/26/20 1930  acyclovir (ZOVIRAX) 775 mg in dextrose 5 % 150 mL IVPB  Status:  Discontinued        10 mg/kg  77.6 kg (Ideal) 165.5 mL/hr over 60 Minutes Intravenous Every 24 hours 07/26/20 1848 07/27/20 1424   07/26/20 1915  ampicillin (OMNIPEN) 2 g in sodium chloride 0.9 % 100 mL IVPB  Status:  Discontinued        2 g 300 mL/hr over 20  Minutes Intravenous Every 8 hours 07/26/20 1848 07/27/20 1424   07/26/20 1900  cefTRIAXone (ROCEPHIN) 2 g in sodium chloride 0.9 % 100 mL IVPB  Status:  Discontinued        2 g 200 mL/hr over 30 Minutes Intravenous Every 12 hours 07/26/20 1846 07/27/20 1447   07/26/20 1851  vancomycin variable dose per unstable renal function (pharmacist dosing)  Status:  Discontinued         Does not apply See admin instructions 07/26/20 1853 07/27/20 1424   07/26/20 1815  ceFEPIme (MAXIPIME) 2 g in sodium chloride 0.9 % 100 mL IVPB  Status:  Discontinued        2 g 200 mL/hr over 30 Minutes Intravenous  Once 07/26/20 1802 07/26/20 1846   07/26/20 1815  metroNIDAZOLE (FLAGYL) IVPB 500 mg        500 mg 100 mL/hr over 60 Minutes Intravenous  Once 07/26/20 1802 07/26/20 2049   07/26/20 1815  vancomycin (VANCOREADY) IVPB 1750 mg/350 mL        1,750 mg 175 mL/hr over 120 Minutes Intravenous  Once 07/26/20 1802 07/26/20 2228         Medications  Scheduled Meds: . apixaban  2.5 mg Oral BID  . budesonide (PULMICORT) nebulizer solution  0.5 mg Nebulization BID  . chlorhexidine   15 mL Mouth Rinse BID  . Chlorhexidine Gluconate Cloth  6 each Topical Q0600  . donepezil  5 mg Oral QHS  . mouth rinse  15 mL Mouth Rinse q12n4p  . melatonin  5 mg Oral QHS  . memantine  5 mg Oral BID  . pantoprazole  40 mg Oral Daily   Continuous Infusions: . sodium chloride 10 mL/hr at 07/30/20 1800  . lactated ringers 10 mL/hr at 07/30/20 1800   PRN Meds:.acetaminophen, albuterol, Gerhardt's butt cream, loperamide, polyethylene glycol      Subjective:   Todd Lloyd was seen and examined today.  Lethargic, not responding to verbal commands, respiratory acidosis on the ABG.  Overnight events noted.  Currently no myoclonic jerks during my encounter.  No fevers or chills.    Objective:   Vitals:   08/05/20 0800 08/05/20 0814 08/05/20 0932 08/05/20 1000  BP: (!) 157/89  (!) 166/68 (!) 156/76  Pulse: (!) 118  (!) 105 96  Resp: (!) 26  20 20   Temp: 98.5 F (36.9 C)     TempSrc: Oral     SpO2: 100% 100% 99% 99%  Weight:      Height:        Intake/Output Summary (Last 24 hours) at 08/05/2020 1144 Last data filed at 08/05/2020 0534 Gross per 24 hour  Intake 0 ml  Output 3800 ml  Net -3800 ml     Wt Readings from Last 3 Encounters:  08/05/20 102.3 kg  04/22/20 93.6 kg  04/19/20 93.9 kg    Physical Exam  General: Lethargic, not following any verbal commands  Cardiovascular: S1 S2 clear, RRR. + pedal edema b/l  Respiratory: Decreased breath sound at the bases  Gastrointestinal: Soft, nontender, nondistended, NBS  Ext: + pedal edema bilaterally  Neuro: unable to assess, patient is lethargic and not following any verbal commands  Musculoskeletal: No cyanosis, clubbing  Skin: No rashes  Psych: somnolent, lethargic    Data Reviewed:  I have personally reviewed following labs and imaging studies  Micro Results Recent Results (from the past 240 hour(s))  Resp Panel by RT-PCR (Flu A&B, Covid) Nasopharyngeal Swab     Status:  None   Collection Time:  07/26/20  5:27 PM   Specimen: Nasopharyngeal Swab; Nasopharyngeal(NP) swabs in vial transport medium  Result Value Ref Range Status   SARS Coronavirus 2 by RT PCR NEGATIVE NEGATIVE Final    Comment: (NOTE) SARS-CoV-2 target nucleic acids are NOT DETECTED.  The SARS-CoV-2 RNA is generally detectable in upper respiratory specimens during the acute phase of infection. The lowest concentration of SARS-CoV-2 viral copies this assay can detect is 138 copies/mL. A negative result does not preclude SARS-Cov-2 infection and should not be used as the sole basis for treatment or other patient management decisions. A negative result may occur with  improper specimen collection/handling, submission of specimen other than nasopharyngeal swab, presence of viral mutation(s) within the areas targeted by this assay, and inadequate number of viral copies(<138 copies/mL). A negative result must be combined with clinical observations, patient history, and epidemiological information. The expected result is Negative.  Fact Sheet for Patients:  EntrepreneurPulse.com.au  Fact Sheet for Healthcare Providers:  IncredibleEmployment.be  This test is no t yet approved or cleared by the [place] FDA and  has been authorized for detection and/or diagnosis of SARS-CoV-2 by FDA under an Emergency Use Authorization (EUA). This EUA will remain  in effect (meaning this test can be used) for the duration of the COVID-19 declaration under Section 564(b)(1) of the Act, 21 U.S.C.section 360bbb-3(b)(1), unless the authorization is terminated  or revoked sooner.       Influenza A by PCR NEGATIVE NEGATIVE Final   Influenza B by PCR NEGATIVE NEGATIVE Final    Comment: (NOTE) The Xpert Xpress SARS-CoV-2/FLU/RSV plus assay is intended as an aid in the diagnosis of influenza from Nasopharyngeal swab specimens and should not be used as a sole basis for treatment. Nasal washings  and aspirates are unacceptable for Xpert Xpress SARS-CoV-2/FLU/RSV testing.  Fact Sheet for Patients: EntrepreneurPulse.com.au  Fact Sheet for Healthcare Providers: IncredibleEmployment.be  This test is not yet approved or cleared by the [place] FDA and has been authorized for detection and/or diagnosis of SARS-CoV-2 by FDA under an Emergency Use Authorization (EUA). This EUA will remain in effect (meaning this test can be used) for the duration of the COVID-19 declaration under Section 564(b)(1) of the Act, 21 U.S.C. section 360bbb-3(b)(1), unless the authorization is terminated or revoked.  Performed at [facility], [place] [address], [place], [place] [address]   Blood Culture (routine x 2)     Status: None   Collection Time: 07/26/20  5:44 PM   Specimen: BLOOD LEFT ARM  Result Value Ref Range Status   Specimen Description BLOOD LEFT ARM  Final   Special Requests   Final    BOTTLES DRAWN AEROBIC AND ANAEROBIC Blood Culture results may not be optimal due to an inadequate volume of blood received in culture bottles   Culture   Final    NO GROWTH 5 DAYS Performed at [facility], [place] [address] [place], [place] [address]    Report Status 07/31/2020 FINAL  Final  Blood Culture (routine x 2)     Status: None   Collection Time: 07/26/20  6:25 PM   Specimen: BLOOD LEFT ARM  Result Value Ref Range Status   Specimen Description BLOOD LEFT ARM  Final   Special Requests   Final    AEROBIC BOTTLE ONLY Blood Culture results may not be optimal due to an inadequate volume of blood received in culture bottles   Culture   Final  NO GROWTH 5 DAYS Performed at [facility], [place] [address], [place], [place] [address]    Report Status 07/31/2020 FINAL  Final  C Difficile Quick Screen w PCR reflex     Status: None   Collection Time: 07/26/20  8:54 PM   Specimen: STOOL  Result Value Ref Range Status   C Diff antigen NEGATIVE  NEGATIVE Final   C Diff toxin NEGATIVE NEGATIVE Final   C Diff interpretation No C. difficile detected.  Final    Comment: Performed at [facility], [place] [address] [place], [place] [address]  Urine culture     Status: None   Collection Time: 07/26/20  9:16 PM   Specimen: In/Out Cath Urine  Result Value Ref Range Status   Specimen Description IN/OUT CATH URINE  Final   Special Requests NONE  Final   Culture   Final    NO GROWTH Performed at [facility], [place] [address], [place], [place] [address]    Report Status 07/28/2020 FINAL  Final  Gastrointestinal Panel by PCR , Stool     Status: Abnormal   Collection Time: 07/27/20  3:06 AM   Specimen: Nasal Mucosa; Stool  Result Value Ref Range Status   Campylobacter species NOT DETECTED NOT DETECTED Final   Plesimonas shigelloides NOT DETECTED NOT DETECTED Final   Salmonella species NOT DETECTED NOT DETECTED Final   Yersinia enterocolitica NOT DETECTED NOT DETECTED Final   Vibrio species NOT DETECTED NOT DETECTED Final   Vibrio cholerae NOT DETECTED NOT DETECTED Final   Enteroaggregative E coli (EAEC) NOT DETECTED NOT DETECTED Final   Enteropathogenic E coli (EPEC) DETECTED (A) NOT DETECTED Final    Comment: RESULT CALLED TO, READ BACK BY AND VERIFIED WITH: BRANDY TURNER 07/27/20 1708 KLW    Enterotoxigenic E coli (ETEC) NOT DETECTED NOT DETECTED Final   Shiga like toxin producing E coli (STEC) NOT DETECTED NOT DETECTED Final   Shigella/Enteroinvasive E coli (EIEC) NOT DETECTED NOT DETECTED Final   Cryptosporidium NOT DETECTED NOT DETECTED Final   Cyclospora cayetanensis NOT DETECTED NOT DETECTED Final   Entamoeba histolytica NOT DETECTED NOT DETECTED Final   Giardia lamblia NOT DETECTED NOT DETECTED Final   Adenovirus F40/41 NOT DETECTED NOT DETECTED Final   Astrovirus NOT DETECTED NOT DETECTED Final   Norovirus GI/GII NOT DETECTED NOT DETECTED Final   Rotavirus A DETECTED (A) NOT DETECTED Final    Comment: RESULT CALLED  TO, READ BACK BY AND VERIFIED WITH: BRANDY TURNER 07/27/20 1708 KLW    Sapovirus (I, II, IV, and V) NOT DETECTED NOT DETECTED Final    Comment: Performed at [facility], [place] [place], [place] [address]  MRSA PCR Screening     Status: None   Collection Time: 07/27/20  7:21 PM   Specimen: Nasal Mucosa; Nasopharyngeal  Result Value Ref Range Status   MRSA by PCR NEGATIVE NEGATIVE Final    Comment:        The GeneXpert MRSA Assay (FDA approved for NASAL specimens only), is one component of a comprehensive MRSA colonization surveillance program. It is not intended to diagnose MRSA infection nor to guide or monitor treatment for MRSA infections. Performed at [facility], [place] [address], [place], [place] [address]     Radiology Reports CT HEAD WO CONTRAST  Result Date: 08/05/2020 CLINICAL DATA:  Expressive aphasia with stroke suspected EXAM: CT HEAD WITHOUT CONTRAST TECHNIQUE: Contiguous axial images were obtained from the base of  the skull through the vertex without intravenous contrast. COMPARISON:  Ten days ago FINDINGS: Brain: No evidence of acute infarction, hemorrhage, hydrocephalus, extra-axial collection or mass lesion/mass effect. Generalized atrophy. Age normal appearance of white matter. Vascular: No hyperdense vessel or unexpected calcification. Skull: Normal. Negative for fracture or focal lesion. Sinuses/Orbits: No acute finding. Bilateral scleral banding and cataract resection. IMPRESSION: Stable head CT. No visible infarct or other change from recent comparison. Electronically Signed   By: Monte Fantasia M.D.   On: 08/05/2020 05:01   CT HEAD WO CONTRAST  Result Date: 07/26/2020 CLINICAL DATA:  Trauma. Unwitnessed fall. EXAM: CT HEAD WITHOUT CONTRAST CT CERVICAL SPINE WITHOUT CONTRAST TECHNIQUE: Multidetector CT imaging of the head and cervical spine was performed following the standard protocol without intravenous contrast. Multiplanar CT image  reconstructions of the cervical spine were also generated. COMPARISON:  CT cervical spine August 30, 2011. FINDINGS: CT HEAD FINDINGS Brain: No evidence of acute large vascular territory infarction, hemorrhage, hydrocephalus, extra-axial collection or mass lesion/mass effect. Mild for age scattered white matter hypodensities, most likely related to chronic microvascular ischemic disease. Mild generalized cerebral atrophy with ex vacuo ventricular dilation. Vascular: Calcific atherosclerosis. No hyperdense vessel identified. Skull: No acute fracture.  Left frontal scalp contusion Sinuses/Orbits: Visualized sinuses are clear. Bilateral scleral buckles. Other: No mastoid effusions. CT CERVICAL SPINE FINDINGS Alignment: Mild anterolisthesis of C4 on C5, favor degenerative given facet arthropathy at this level. Broad dextrocurvature of the cervical spine. Skull base and vertebrae: Vertebral body heights are maintained. No evidence of acute fracture. Soft tissues and spinal canal: No prevertebral fluid or swelling. No visible canal hematoma. Disc levels: Left eccentric degenerative disc disease in the lower cervical spine, greatest at C7-T1 with disc height loss and endplate sclerosis. Bulky facet hypertrophy at multiple levels, greatest on the left at C4-C5 where there is likely at least moderate bony foraminal stenosis. Upper chest: Emphysema. Lung apices appear clear although evaluation is limited by motion. IMPRESSION: CT head: 1. No evidence of acute intracranial abnormality. 2. Left frontal scalp contusion without acute fracture. CT cervical spine: 1. No evidence of acute fracture. 2. Rotation of C1 on C2 is likely positional in the absence of a fixed torticollis. 3. Bulky facet hypertrophy at multiple levels, greatest on the left at C4-C5 where there is likely at least moderate bony foraminal stenosis. 4. Left eccentric degenerative disc disease in the lower cervical spine, greatest at C7-T1. Electronically Signed    By: Margaretha Sheffield MD   On: 07/26/2020 17:59   CT CHEST W CONTRAST  Result Date: 07/07/2020 CLINICAL DATA:  Restaging non-small cell lung cancer. Status post radiation therapy. EXAM: CT CHEST WITH CONTRAST TECHNIQUE: Multidetector CT imaging of the chest was performed during intravenous contrast administration. CONTRAST:  2mL OMNIPAQUE IOHEXOL 300 MG/ML  SOLN COMPARISON:  Multiple prior CT scans and a PET scan from 02/24/2020 FINDINGS: Cardiovascular: The heart is within normal limits in size for age and stable. No pericardial effusion. Stable aortic and coronary artery calcifications. 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 The pulmonary arteries are enlarged suggesting pulmonary hypertension. Mediastinum/Nodes: Small scattered mediastinal and hilar lymph nodes are stable. No mass or overt adenopathy. The esophagus is grossly normal. Lungs/Pleura: The bilobed right lower lobe lung mass measures approximately 13.5 x 9 mm. It previously measured 20 x 13 mm. Surrounding interstitial changes are likely due to radiation. There is also a new right-sided pleural effusion which is small to moderate in size. No new worrisome pulmonary nodules to  suggest pulmonary metastatic disease. A few tiny scattered stable subpleural nodules in the upper lung zones bilaterally are noted. Stable emphysematous changes and pulmonary scarring. Upper Abdomen: No significant upper abdominal findings. No worrisome hepatic lesions. There is a stable calcified lesion associated with the left adrenal gland possibly related to prior hemorrhage. Musculoskeletal: No significant bony findings. IMPRESSION: 1. Interval decrease in size of the bilobed right lower lobe lung mass suggesting a response to radiation treatment. 2. New small to moderate right-sided pleural effusion. 3. No new worrisome pulmonary nodules to suggest pulmonary metastatic disease. 4. Stable emphysematous changes and pulmonary scarring. 5. Stable scattered  mediastinal and hilar lymph nodes. No adenopathy. 6. No findings to suggest upper abdominal metastatic disease. Aortic Atherosclerosis (ICD10-I70.0) and Emphysema (ICD10-J43.9). Electronically Signed   By: Marijo Sanes M.D.   On: 07/07/2020 11:45   CT CERVICAL SPINE WO CONTRAST  Result Date: 07/26/2020 CLINICAL DATA:  Trauma. Unwitnessed fall. EXAM: CT HEAD WITHOUT CONTRAST CT CERVICAL SPINE WITHOUT CONTRAST TECHNIQUE: Multidetector CT imaging of the head and cervical spine was performed following the standard protocol without intravenous contrast. Multiplanar CT image reconstructions of the cervical spine were also generated. COMPARISON:  CT cervical spine August 30, 2011. FINDINGS: CT HEAD FINDINGS Brain: No evidence of acute large vascular territory infarction, hemorrhage, hydrocephalus, extra-axial collection or mass lesion/mass effect. Mild for age scattered white matter hypodensities, most likely related to chronic microvascular ischemic disease. Mild generalized cerebral atrophy with ex vacuo ventricular dilation. Vascular: Calcific atherosclerosis. No hyperdense vessel identified. Skull: No acute fracture.  Left frontal scalp contusion Sinuses/Orbits: Visualized sinuses are clear. Bilateral scleral buckles. Other: No mastoid effusions. CT CERVICAL SPINE FINDINGS Alignment: Mild anterolisthesis of C4 on C5, favor degenerative given facet arthropathy at this level. Broad dextrocurvature of the cervical spine. Skull base and vertebrae: Vertebral body heights are maintained. No evidence of acute fracture. Soft tissues and spinal canal: No prevertebral fluid or swelling. No visible canal hematoma. Disc levels: Left eccentric degenerative disc disease in the lower cervical spine, greatest at C7-T1 with disc height loss and endplate sclerosis. Bulky facet hypertrophy at multiple levels, greatest on the left at C4-C5 where there is likely at least moderate bony foraminal stenosis. Upper chest: Emphysema. Lung  apices appear clear although evaluation is limited by motion. IMPRESSION: CT head: 1. No evidence of acute intracranial abnormality. 2. Left frontal scalp contusion without acute fracture. CT cervical spine: 1. No evidence of acute fracture. 2. Rotation of C1 on C2 is likely positional in the absence of a fixed torticollis. 3. Bulky facet hypertrophy at multiple levels, greatest on the left at C4-C5 where there is likely at least moderate bony foraminal stenosis. 4. Left eccentric degenerative disc disease in the lower cervical spine, greatest at C7-T1. Electronically Signed   By: Margaretha Sheffield MD   On: 07/26/2020 17:59   US RENAL  Result Date: 07/28/2020 CLINICAL DATA:  Acute kidney injury EXAM: RENAL / URINARY TRACT ULTRASOUND COMPLETE COMPARISON:  None. FINDINGS: Right Kidney: Renal measurements: 10 x 6.2 x 5.7 cm = volume: 184.4 mL. Echogenicity within normal limits. No mass or hydronephrosis visualized. Left Kidney: Renal measurements: 10.2 x 6.6 x 6 cm = volume: 211.7 mL. Echogenicity within normal limits. 1.8 cm cyst at the upper pole. No hydronephrosis visualized. Bladder: Decompressed and not visualized Other: None. IMPRESSION: No hydronephrosis. Electronically Signed   By: Macy Mis M.D.   On: 07/28/2020 16:23   DG Pelvis Portable  Result Date: 07/26/2020 CLINICAL DATA:  Recent  fall with pelvic pain, initial encounter EXAM: PORTABLE PELVIS 1-2 VIEWS COMPARISON:  02/24/2020 FINDINGS: Pelvic ring is intact. No acute fracture or dislocation is noted. Postsurgical changes are seen. No soft tissue abnormality is noted. Degenerative changes of lumbar spine are noted as well. Left upper quadrant calcification is noted similar to that seen on prior PET-CT. IMPRESSION: No acute abnormality noted. Electronically Signed   By: Inez Catalina M.D.   On: 07/26/2020 17:55   DG Chest Port 1 View  Result Date: 07/30/2020 CLINICAL DATA:  Respiratory failure EXAM: PORTABLE CHEST 1 VIEW COMPARISON:   07/26/2020 FINDINGS: The lungs are symmetrically well inflated. Minimal left basilar atelectasis. Mild coarsening of the pulmonary interstitium is again noted. No pneumothorax or pleural effusion. Cardiac size is mildly enlarged. Pulmonary vasculature is normal. No acute bone abnormality. IMPRESSION: No active disease.  Stable cardiomegaly. Electronically Signed   By: Fidela Salisbury MD   On: 07/30/2020 07:04   DG Chest Port 1 View  Result Date: 07/26/2020 CLINICAL DATA:  Recent fall EXAM: PORTABLE CHEST 1 VIEW COMPARISON:  07/06/2020 FINDINGS: Cardiac shadow is mildly enlarged but stable. Aortic calcifications are seen. Known right lower lobe mass lesion is not well appreciated on this exam. No infiltrate or sizable effusion is seen. No acute bony abnormality is noted. IMPRESSION: No acute abnormality noted. Electronically Signed   By: Inez Catalina M.D.   On: 07/26/2020 17:57   EEG adult  Result Date: 07/27/2020 Lora Havens, MD     07/27/2020 10:35 AM Patient Name: SHAFTER JUPIN MRN: [id number] Epilepsy Attending: Lora Havens Referring Physician/Provider: Dr. Kerney Elbe Date: 07/27/2020 Duration: 23.04 mins  Patient history: 80yo M with ams. EEG to evaluate for seizure  Level of alertness: Awake  AEDs during EEG study: None  Technical aspects: This EEG study was done with scalp electrodes positioned according to the 10-20 International system of electrode placement. Electrical activity was acquired at a sampling rate of 500Hz  and reviewed with a high frequency filter of 70Hz  and a low frequency filter of 1Hz . EEG data were recorded continuously and digitally stored.  Description: EEG showed continuous generalized 4.5-5Hz  theta slowing. Hyperventilation and photic stimulation were not performed.    ABNORMALITY -Continuous slow, generalized  IMPRESSION: This study is suggestive of moderate diffuse encephalopathy, nonspecific etiology. No seizures or epileptiform discharges were seen  throughout the recording.   Lora Havens   ECHOCARDIOGRAM LIMITED  Result Date: 07/27/2020    ECHOCARDIOGRAM LIMITED REPORT   Patient Name:   ABASS MISENER Date of Exam: 07/27/2020 Medical Rec #:  [id number]          Height:       72.0 in Accession #:    [id number]         Weight:       214.5 lb Date of Birth:  Feb 20, 1941          BSA:          2.195 m Patient Age:    17 years           BP:           105/57 mmHg Patient Gender: M                  HR:           77 bpm. Exam Location:  Inpatient Procedure: Limited Echo, Cardiac Doppler and Color Doppler Indications:    Congestive Heart Failure I50.9  History:  Patient has prior history of Echocardiogram examinations, most                 recent 03/03/2020. COPD, Arrythmias:Atrial Fibrillation; Risk                 Factors:Hypertension, Dyslipidemia and Former Smoker.  Sonographer:    Vickie Epley RDCS Referring Phys: Energy  1. Left ventricular ejection fraction, by estimation, is 55 to 60%. The left ventricle has normal function. Left ventricular diastolic function could not be evaluated.  2. The mitral valve is grossly normal. No evidence of mitral stenosis.  3. Tricuspid valve regurgitation is mild to moderate.  4. The aortic valve is grossly normal. Aortic valve regurgitation is trivial. No aortic stenosis is present.  5. There is moderately elevated pulmonary artery systolic pressure. FINDINGS  Left Ventricle: Left ventricular ejection fraction, by estimation, is 55 to 60%. The left ventricle has normal function. Left ventricular diastolic function could not be evaluated. Left ventricular diastolic function could not be evaluated due to atrial  fibrillation. Right Ventricle: There is moderately elevated pulmonary artery systolic pressure. The tricuspid regurgitant velocity is 2.77 m/s, and with an assumed right atrial pressure of 15 mmHg, the estimated right ventricular systolic pressure is 81.8 mmHg. Mitral Valve:  The mitral valve is grossly normal. No evidence of mitral valve stenosis. Tricuspid Valve: The tricuspid valve is normal in structure. Tricuspid valve regurgitation is mild to moderate. Aortic Valve: The aortic valve is grossly normal. Aortic valve regurgitation is trivial. No aortic stenosis is present. Pulmonic Valve: The pulmonic valve was normal in structure. Pulmonic valve regurgitation is trivial. Aorta: The aortic root and ascending aorta are structurally normal, with no evidence of dilitation. LEFT VENTRICLE PLAX 2D LVIDd:         5.00 cm LVIDs:         3.80 cm LV PW:         1.00 cm LV IVS:        1.00 cm LVOT diam:     2.40 cm LVOT Area:     4.52 cm  LV Volumes (MOD) LV vol d, MOD A2C: 112.0 ml LV vol d, MOD A4C: 101.0 ml LV vol s, MOD A2C: 56.8 ml LV vol s, MOD A4C: 55.4 ml LV SV MOD A2C:     55.2 ml LV SV MOD A4C:     101.0 ml LV SV MOD BP:      49.6 ml RIGHT VENTRICLE TAPSE (M-mode): 1.5 cm LEFT ATRIUM         Index LA diam:    5.10 cm 2.32 cm/m   AORTA Ao Root diam: 3.80 cm Ao Asc diam:  3.60 cm TRICUSPID VALVE TR Peak grad:   30.7 mmHg TR Vmax:        277.00 cm/s  SHUNTS Systemic Diam: 2.40 cm Mertie Moores MD Electronically signed by Mertie Moores MD Signature Date/Time: 07/27/2020/3:32:59 PM    Final    CT CHEST ABDOMEN PELVIS WO CONTRAST  Result Date: 07/26/2020 CLINICAL DATA:  Sepsis and leukocytosis, recent fall, history of lung carcinoma EXAM: CT CHEST, ABDOMEN AND PELVIS WITHOUT CONTRAST TECHNIQUE: Multidetector CT imaging of the chest, abdomen and pelvis was performed following the standard protocol without IV contrast. COMPARISON:  Chest x-ray and pelvis film from earlier in the same day., 07/06/2020 FINDINGS: CT CHEST FINDINGS Cardiovascular: Atherosclerotic calcifications of the aorta are noted without aneurysmal dilatation. No cardiac enlargement is seen. No pericardial effusion is noted. 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 Mediastinum/Nodes: Thoracic inlet is within normal  limits. No sizable hilar or mediastinal adenopathy is noted. The esophagus as visualized is within normal limits. Lungs/Pleura: Left lung is well aerated with mild emphysematous changes. No focal infiltrate or sizable effusion is seen. Previously seen bilobed right lower lobe mass lesion is again identified and stable. Small right-sided pleural effusion is noted stable from the prior exam. Musculoskeletal: Degenerative changes of the thoracic spine are noted. No acute rib abnormality is seen. CT ABDOMEN PELVIS FINDINGS Hepatobiliary: No focal liver abnormality is seen. No gallstones, gallbladder wall thickening, or biliary dilatation. Pancreas: Unremarkable. No pancreatic ductal dilatation or surrounding inflammatory changes. Spleen: Normal in size without focal abnormality. Adrenals/Urinary Tract: Adrenal glands again demonstrate calcification on the left consistent with prior adrenal hematoma. The overall appearance is stable. Right adrenal gland is within normal limits. Kidneys are well visualized bilaterally. Tiny nonobstructing left renal stones are noted. Ureters are within normal limits. Bladder is decompressed by Foley catheter. No obstructive changes are seen. Stomach/Bowel: Colon is well visualized with some fluid within. Considerable wall thickening is noted within the ascending colon new from prior PET-CT from 02/24/2020. These changes are consistent with focal colitis without evidence of perforation. Some pericolonic inflammatory changes are seen. Small bowel and stomach appear within normal limits. The appendix is not well visualized. Vascular/Lymphatic: Aortic atherosclerosis. No enlarged abdominal or pelvic lymph nodes. Reproductive: Prostate is unremarkable. Other: No abdominal wall hernia or abnormality. No abdominopelvic ascites. Musculoskeletal: No acute or significant osseous findings. IMPRESSION: CT of the chest: Stable right lower lobe mass lesion with associated right effusion similar to that  seen on the prior exam. CT of the abdomen and pelvis: Tiny nonobstructing left renal stone. Changes of ascending colitis without evidence of perforation or abscess formation. Stable calcified left adrenal hematoma. Aortic Atherosclerosis (ICD10-I70.0) and Emphysema (ICD10-J43.9). Electronically Signed   By: Inez Catalina M.D.   On: 07/26/2020 23:08    Lab Data:  CBC: Recent Labs  Lab 08/01/20 0458 08/02/20 0334 08/03/20 0209 08/04/20 0226 08/05/20 0301  WBC 4.5 4.7 3.4* 3.4* 5.2  HGB 8.0* 8.0* 7.9* 8.1* 8.3*  HCT 25.8* 26.4* 26.0* 26.2* 27.4*  MCV 89.6 90.1 89.7 90.3 92.3  PLT 102* 130* 140* 179 563   Basic Metabolic Panel: Recent Labs  Lab 07/29/20 2139 07/30/20 0135 07/31/20 0139 07/31/20 2133 08/01/20 0458 08/02/20 0334 08/03/20 0209 08/04/20 0226 08/05/20 0301 08/05/20 0948  NA 141 140   < > 138 139 140 142 143 144  --   K 4.7 3.9   < > 3.7 3.7 3.7 3.7 3.9 4.0  --   CL 104 101   < > 101 102 102 103 103 102  --   CO2 23 26   < > 26 23 26 27 29 30   --   GLUCOSE 82 86   < > 120* 102* 130* 100* 112* 104*  --   BUN 66* 66*   < > 75* 78* 82* 85* 86* 85*  --   CREATININE 7.12* 7.31*   < > 8.11* 8.44* 8.78* 8.57* 7.36* 5.78*  --   CALCIUM 6.9* 6.9*   < > 7.1* 7.2* 7.4* 7.7* 7.9* 8.0*  --   MG 1.9 2.0  --  1.9  --   --   --   --  1.3* 1.7  PHOS  --  5.5*   < >  --  6.1* 6.9* 6.8* 6.6* 6.5*  --    < > =  values in this interval not displayed.   GFR: Estimated Creatinine Clearance: 12.6 mL/min (A) (by C-G formula based on SCr of 5.78 mg/dL (H)). Liver Function Tests: Recent Labs  Lab 08/01/20 0458 08/02/20 0334 08/03/20 0209 08/04/20 0226 08/05/20 0301  ALBUMIN 2.5* 2.5* 2.6* 2.8* 3.0*   No results for input(s): LIPASE, AMYLASE in the last 168 hours. No results for input(s): AMMONIA in the last 168 hours. Coagulation Profile: No results for input(s): INR, PROTIME in the last 168 hours. Cardiac Enzymes: No results for input(s): CKTOTAL, CKMB, CKMBINDEX, TROPONINI in  the last 168 hours. BNP (last 3 results) No results for input(s): PROBNP in the last 8760 hours. HbA1C: No results for input(s): HGBA1C in the last 72 hours. CBG: Recent Labs  Lab 08/04/20 2012 08/05/20 0028 08/05/20 0736 08/05/20 0827 08/05/20 1129  GLUCAP 144* 99 66* 129* 84   Lipid Profile: No results for input(s): CHOL, HDL, LDLCALC, TRIG, CHOLHDL, LDLDIRECT in the last 72 hours. Thyroid Function Tests: No results for input(s): TSH, T4TOTAL, FREET4, T3FREE, THYROIDAB in the last 72 hours. Anemia Panel: Recent Labs    08/04/20 0226  FERRITIN 65  TIBC 246*  IRON 86  RETICCTPCT 0.8   Urine analysis:    Component Value Date/Time   COLORURINE AMBER (A) 07/26/2020 2153   APPEARANCEUR CLOUDY (A) 07/26/2020 2153   LABSPEC 1.016 07/26/2020 2153   PHURINE 5.0 07/26/2020 2153   GLUCOSEU NEGATIVE 07/26/2020 2153   HGBUR LARGE (A) 07/26/2020 2153   BILIRUBINUR NEGATIVE 07/26/2020 2153   KETONESUR NEGATIVE 07/26/2020 2153   PROTEINUR 100 (A) 07/26/2020 2153   UROBILINOGEN 0.2 11/13/2013 1429   NITRITE NEGATIVE 07/26/2020 2153   LEUKOCYTESUR NEGATIVE 07/26/2020 2153     Ripudeep Rai M.D. [facility] 08/05/2020, 11:44 AM  Available via Epic secure chat 7am-7pm After 7 pm, please refer to night coverage provider listed on amion.

## 2020-08-06 DIAGNOSIS — K921 Melena: Secondary | ICD-10-CM | POA: Diagnosis not present

## 2020-08-06 DIAGNOSIS — D62 Acute posthemorrhagic anemia: Secondary | ICD-10-CM

## 2020-08-06 DIAGNOSIS — K529 Noninfective gastroenteritis and colitis, unspecified: Secondary | ICD-10-CM | POA: Diagnosis not present

## 2020-08-06 DIAGNOSIS — R935 Abnormal findings on diagnostic imaging of other abdominal regions, including retroperitoneum: Secondary | ICD-10-CM

## 2020-08-06 DIAGNOSIS — Z515 Encounter for palliative care: Secondary | ICD-10-CM

## 2020-08-06 DIAGNOSIS — R4182 Altered mental status, unspecified: Secondary | ICD-10-CM | POA: Diagnosis not present

## 2020-08-06 DIAGNOSIS — Z7189 Other specified counseling: Secondary | ICD-10-CM | POA: Diagnosis not present

## 2020-08-06 DIAGNOSIS — J9602 Acute respiratory failure with hypercapnia: Secondary | ICD-10-CM | POA: Diagnosis not present

## 2020-08-06 DIAGNOSIS — Z66 Do not resuscitate: Secondary | ICD-10-CM | POA: Diagnosis not present

## 2020-08-06 DIAGNOSIS — Z7901 Long term (current) use of anticoagulants: Secondary | ICD-10-CM | POA: Diagnosis not present

## 2020-08-06 DIAGNOSIS — N179 Acute kidney failure, unspecified: Secondary | ICD-10-CM | POA: Diagnosis not present

## 2020-08-06 LAB — CBC
HCT: 25.3 % — ABNORMAL LOW (ref 39.0–52.0)
HCT: 21.1 % — ABNORMAL LOW (ref 39.0–52.0)
Hemoglobin: 7.2 g/dL — ABNORMAL LOW (ref 13.0–17.0)
Hemoglobin: 6.2 g/dL — CL (ref 13.0–17.0)
MCH: 27 pg (ref 26.0–34.0)
MCH: 27.1 pg (ref 26.0–34.0)
MCHC: 28.5 g/dL — ABNORMAL LOW (ref 30.0–36.0)
MCHC: 29.4 g/dL — ABNORMAL LOW (ref 30.0–36.0)
MCV: 94.8 fL (ref 80.0–100.0)
MCV: 92.1 fL (ref 80.0–100.0)
Platelets: 225 10*3/uL (ref 150–400)
Platelets: 239 10*3/uL (ref 150–400)
RBC: 2.67 MIL/uL — ABNORMAL LOW (ref 4.22–5.81)
RBC: 2.29 MIL/uL — ABNORMAL LOW (ref 4.22–5.81)
RDW: 20 % — ABNORMAL HIGH (ref 11.5–15.5)
RDW: 19.9 % — ABNORMAL HIGH (ref 11.5–15.5)
WBC: 5.7 10*3/uL (ref 4.0–10.5)
WBC: 6.2 10*3/uL (ref 4.0–10.5)
nRBC: 0 % (ref 0.0–0.2)
nRBC: 0 % (ref 0.0–0.2)

## 2020-08-06 LAB — RENAL FUNCTION PANEL
Albumin: 2.7 g/dL — ABNORMAL LOW (ref 3.5–5.0)
Anion gap: 8 (ref 5–15)
BUN: 88 mg/dL — ABNORMAL HIGH (ref 8–23)
CO2: 34 mmol/L — ABNORMAL HIGH (ref 22–32)
Calcium: 8.1 mg/dL — ABNORMAL LOW (ref 8.9–10.3)
Chloride: 103 mmol/L (ref 98–111)
Creatinine, Ser: 4.76 mg/dL — ABNORMAL HIGH (ref 0.61–1.24)
GFR, Estimated: 12 mL/min — ABNORMAL LOW (ref >60)
Glucose, Bld: 139 mg/dL — ABNORMAL HIGH (ref 70–99)
Phosphorus: 6.1 mg/dL — ABNORMAL HIGH (ref 2.5–4.6)
Potassium: 4.1 mmol/L (ref 3.5–5.1)
Sodium: 145 mmol/L (ref 135–145)

## 2020-08-06 LAB — HEMOGLOBIN AND HEMATOCRIT, BLOOD
HCT: 23.2 % — ABNORMAL LOW (ref 39.0–52.0)
Hemoglobin: 7.1 g/dL — ABNORMAL LOW (ref 13.0–17.0)

## 2020-08-06 LAB — GLUCOSE, CAPILLARY
Glucose-Capillary: 113 mg/dL — ABNORMAL HIGH (ref 70–99)
Glucose-Capillary: 129 mg/dL — ABNORMAL HIGH (ref 70–99)
Glucose-Capillary: 107 mg/dL — ABNORMAL HIGH (ref 70–99)
Glucose-Capillary: 85 mg/dL (ref 70–99)
Glucose-Capillary: 93 mg/dL (ref 70–99)
Glucose-Capillary: 124 mg/dL — ABNORMAL HIGH (ref 70–99)

## 2020-08-06 LAB — ABO/RH: ABO/RH(D): B POS

## 2020-08-06 LAB — HEPARIN LEVEL (UNFRACTIONATED): Heparin Unfractionated: 0.64 IU/mL (ref 0.30–0.70)

## 2020-08-06 LAB — OCCULT BLOOD X 1 CARD TO LAB, STOOL: Fecal Occult Bld: POSITIVE — AB

## 2020-08-06 LAB — PREPARE RBC (CROSSMATCH)

## 2020-08-06 MED ORDER — SODIUM CHLORIDE 0.9 % IV SOLN
80.0000 mg | Freq: Once | INTRAVENOUS | Status: AC
  Administered 2020-08-06: 80 mg via INTRAVENOUS
  Filled 2020-08-06: qty 80

## 2020-08-06 MED ORDER — CHARCOAL ACTIVATED PO LIQD: 50.0000 g | ORAL | Status: DC

## 2020-08-06 MED ORDER — SODIUM CHLORIDE 0.9 % IV SOLN
8.0000 mg/h | INTRAVENOUS | Status: DC
  Administered 2020-08-06 – 2020-08-08 (×8): 8 mg/h via INTRAVENOUS
  Filled 2020-08-06 (×8): qty 80

## 2020-08-06 MED ORDER — PANTOPRAZOLE SODIUM 40 MG IV SOLR: 40.0000 mg | Freq: Two times a day (BID) | INTRAVENOUS | Status: DC

## 2020-08-06 MED ORDER — SODIUM CHLORIDE 0.9 % IV BOLUS
500.0000 mL | Freq: Once | INTRAVENOUS | Status: AC
  Administered 2020-08-06: 500 mL via INTRAVENOUS

## 2020-08-06 MED ORDER — PROTHROMBIN COMPLEX CONC HUMAN 500 UNITS IV KIT
4770.0000 [IU] | PACK | Status: AC
  Administered 2020-08-06: 4770 [IU] via INTRAVENOUS
  Filled 2020-08-06: qty 770

## 2020-08-06 MED ORDER — SODIUM CHLORIDE 0.9% IV SOLUTION: Freq: Once | INTRAVENOUS | Status: AC
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
## 2020-08-06 NOTE — Significant Event (Addendum)
Rapid Response Event Note   Reason for Call :  Hypotension 86/47, intermittent lethargy  Initial Focused Assessment:  Pt alert, oriented. Skin warm, dry, pale. Lung sounds are clear, no distress. Abdomen is round, soft. Abdomen tender to light palpation. Bowel sounds active. Peripheral pulses 1+ in all extremities.   Per RN, pt having episodes of lethargy, difficulty to arouse. Once he is aroused his mentation is intact. Per RN, this morning pt has been restless and complaining of his chronic back pain.   VS: BP 91/43, HR 93, RR 12, SPO2 100% on 6LNC  Interventions:  Pt currently receiving 2U PRBC and a 500 cc bolus MD consulting PCCM  Plan of Care:  -Repeat H&H 2 hours post transfusion -Kcentra ordered -Close monitoring of I&O -Increase frequency of VS and neuro checks  Call rapid response for additional needs  Event Summary:  MD Notified: Dr. Tana Coast Call Time: 1610 Arrival Time: 1050 End Time: [place], RN

## 2020-08-06 NOTE — Progress Notes (Signed)
Family updated via phone.  All questions answered.

## 2020-08-06 NOTE — Progress Notes (Signed)
Physician notified lab results, drop in hemoglobin and stool positive for blood.  Order to make patient NPO. Patient informed/educated.

## 2020-08-06 NOTE — Progress Notes (Signed)
Patient had large BM, red tinge clots noted. Provider paged, orders for stat H&H and occult stool.  Patient positioned for comfort, Bipap placed by respiratory.  Vitals stable at this time, patient alert and oriented x3 .

## 2020-08-06 NOTE — Consult Note (Signed)
Palliative Medicine Inpatient Consult Note  Reason for consult:  Goals of Care  "80yo male with dementia, admitted with septic shock, acute respiratory failure, has been refusing Bipap, ac encephaloapthy, acute kidney injury, GI bleed, need goals of care"  HPI:  Per intake H&P --> 80 year old male with history of COPD, stage I non-small cell lung cancer s/p radiation in 04/2020, dementia, paroxysmal A. fib on Eliquis, GERD, leukemia s/p bone marrow transplantation in 1990 was admitted to ICU under PCCM service with altered mental status after a fall and diarrhea. He was found to be in shock/respiratory failure requiring BiPAP, acute kidney injury, rhabdomyolysis. CT of the head and C-spine were negative for acute abnormalities. CT of the abdomen and pelvis showed focal colitis without evidence of perforation. Neurology and nephrology were consulted. He was treated with bicarb drip. EEG was negative for seizures. He was treated with broad-spectrum antibiotics initially; stool was positive for rotavirus and enteropathogenic E. Coli, received Zithromax. He was treated with intravenous fluids initially. Urine output has been minimal and he has been getting intermittent Lasix recently by nephrology. Mental status has improved; neurology signed off. Patient was transferred to [facility] service on 07/31/2020.  Palliative care was asked to get involved in the setting of multiple co-morbidities with acute septic shock causing respiratory failure and AKI.   Clinical Assessment/Goals of Care:  *Please note that this is a verbal dictation therefore any spelling or grammatical errors are due to the "[place] [employer]" system interpretation.  I have reviewed medical records including EPIC notes, labs and imaging, received report from bedside RN. I assessed the patient who was lying in bed per bedside RN, Todd Lloyd his responses are slower than they were earlier.    I met with Todd Lloyd at bedside, we  called his daughter, Todd Lloyd on speaker phone to further discuss diagnosis prognosis, Todd Lloyd, EOL wishes, disposition and options.   I introduced Palliative Medicine as specialized medical care for people living with serious illness. It focuses on providing relief from the symptoms and stress of a serious illness. The goal is to improve quality of life for both the patient and the family.  Todd Lloyd shares that he is from [place] originally. He lived in [place] for sometime and then moved to [place] to be closer to his daughter. He is married and has been with his spouse for > 30 years. He states that he use to work at [employer] He does not consider himself to be an overtly religious man.  Prior to admission, Todd Lloyd was fully functional. He did not use a can or a walker and her helped to care for his spouse who suffered a stroke years ago.   Todd Lloyd understands that presently he has a GIB and it is being treated with blood transfusions. We reviewed that the GI is involved. Patients daughter shares that they may consider an endoscopy though I do not see this in their note at this time.   We reviewed Todd Lloyd's poor functional state and his recent fall. His daughter does on to share that he can no longer live at home anymore and that he requires more help than she is able to provide. We discussed potential options from here first of which would be skilled nursing facility placement.   A detailed discussion was had today regarding advanced directives - Todd Lloyd shares that she will bring this in today in order for the medical team to create a copy.    Concepts specific to code status, artifical  feeding and hydration, continued IV antibiotics and rehospitalization was had.  A MOST form was completed as below:  Cardiopulmonary Resuscitation: Do Not Attempt Resuscitation (DNR/No CPR)  Medical Interventions: Limited Additional Interventions: Use medical treatment, IV fluids and cardiac monitoring  as indicated, DO NOT USE intubation or mechanical ventilation. May consider use of less invasive airway support such as BiPAP or CPAP. Also provide comfort measures. Transfer to the hospital if indicated. Avoid intensive care.   Antibiotics: Determine use of limitation of antibiotics when infection occurs  IV Fluids: IV fluids for a defined trial period  Feeding Tube: No feeding tube   The difference between a aggressive medical intervention path  and a palliative comfort care path for this patient at this time was had.We discussed continuing the present course for the time being. If for any reason Todd Lloyd should continue to decline we would further discuss more of a comfort oriented direction of care. At this time Todd Lloyd himself would like for [place] to treat what is treatable. In terms of his poor kidney function, he would only desire for a trial of dialysis and would never want long term HD.  Discussed the importance of continued conversation with family and their  medical providers regarding overall plan of care and treatment options, ensuring decisions are within the context of the patients values and GOCs.  Decision Maker: Todd Lloyd (daughter) [phone]  SUMMARY OF RECOMMENDATIONS   DNAR/DNI, patient would be open to a trial of dialysis only and would not want to be on long term HD  MOST Completed, paper copy placed onto the chart electric copy can be found in [name]  DNR Form Completed, paper copy placed onto the chart electric copy can be found in [name]  Patients daughter will bring in a copy of advance directives for filing  Timed trial of treatments to see if improvements can be made  If improvements are made Todd Lloyd will discharge to a skilled nursing facility for additional rehabilitation  Ongoing PMT support  Code Status/Advance Care Planning: DNAR/DNI   Palliative Prophylaxis:   Oral Care, Mobility, Aspiration and delirium precuations  Additional Recommendations  (Limitations, Scope, Preferences): Continue to treat what is treatable  Psycho-social/Spiritual:   Desire for further Chaplaincy support: No  Additional Recommendations: Education on acute illness   Prognosis: much will depend upon clinical progression though at present has a very guarded clinical condition with a high risk for acute decompensation  Discharge Planning: Unclear  Review of Systems  Constitutional: Positive for malaise/fatigue.  Neurological: Positive for weakness.   Oral Intake %:  NPO I/O:  Cr improving, poor I/O therefore cannot identify how much patient urinated in last 24/hr Bowel Movements: Soft BM today  Mobility: PT has seen and recommend SNF  Vitals:   08/06/20 1110 08/06/20 1115  BP: (!) 91/43 (!) 103/41  Pulse: 93 (!) 124  Resp: 12 (!) 24  Temp:    SpO2: 100% 90%    Intake/Output Summary (Last 24 hours) at 08/06/2020 1124 Last data filed at 08/06/2020 0336 Gross per 24 hour  Intake --  Output 1450 ml  Net -1450 ml   Last Weight  Most recent update: 08/06/2020  5:43 AM   Weight  99 kg (218 lb 3.2 oz)           Gen:  Ill appearing geriatric M HEENT: moist mucous membranes CV: Regular rate and irregular rhythm  PULM: On 6LPM [place] ABD: soft/nontender  EXT: No edema  Neuro: Alert and oriented x3  PPS: 30%   This conversation/these recommendations were discussed with patient primary care team, Dr. Tana Coast  Time In: 1150 Time Out: 1300 Total Time: 32 Greater than 50%  of this time was spent counseling and coordinating care related to the above assessment and plan.  [facility] Team Team Cell Phone: [phone] Please utilize secure chat with additional questions, if there is no response within 30 minutes please call the above phone number  Palliative Medicine Team providers are available by phone from 7am to 7pm daily and can be reached through the team cell phone.  Should this patient require  assistance outside of these hours, please call the patient's attending physician.

## 2020-08-06 NOTE — Progress Notes (Signed)
PT Cancellation Note  Patient Details Name: Todd Lloyd MRN: [id number] DOB: [birth year]   Cancelled Treatment:    Reason Eval/Treat Not Completed: Medical issues which prohibited therapy (Hgb 6.2 with nurse asking PT to [place] 08/06/2020, 1:18 PM [place] M,PT Acute Rehab Services [phone] (pager)

## 2020-08-06 NOTE — Progress Notes (Signed)
PCCM Interval Progress Note  Received call from Dr. Tana Coast regarding new GI bleed with hypotension (SBP 80s) and Hgb 6.2  Received 500cc NS bolus and 1u PRBC.  SBP improved to 108 after above measures.  2nd unit blood transfusing now.  Repeat H/H ordered post transfusion.  [facility] ordered for Eliquis reversal (last dose Eliquis 2245 last night).  DNR status noted and confirmed.  I eyeballed pt along with rapid response team at bedside (around 1140AM).  Pt comfortable, alert, oriented.  Vitals stable with SBP 102 - 108 and HR 85 - 96.  Informed Dr. Tana Coast we recommend continuing with blood and fluid resuscitation and follow H/H trend. GI on board already - Considering tagged RBC scan versus endoscopy (though suspect this is lower GI bleed, likely diverticula in origin).  No further recs from our standpoint given full DNR status.  No ICU needs at this time.  Please call back if we can be of further assistance.  No charge.   Montey Hora, [facility] For pager details, please see AMION If no response to pager, please call [phone] until 7:00 PM After 7:00 PM, please call Elink at [phone] 08/06/2020, 1:01 PM

## 2020-08-06 NOTE — Progress Notes (Signed)
Patient had large BM with clots and "red tinge." HH ordered. Holding eliquis.

## 2020-08-06 NOTE — Progress Notes (Signed)
3/18  Test: Hgb Critical Value: 6.2  Name of Provider Notified: Dr. Tana Coast  Orders Received? Or Actions Taken?: MD coming to bedside to assess

## 2020-08-06 NOTE — Progress Notes (Signed)
Patient had a second bloody stool with large amount of clots. Vitals stable at this time, patient alert.  Physician notified, CBC ordered

## 2020-08-06 NOTE — Progress Notes (Signed)
[facility]                                                                              Patient Demographics  Todd Lloyd, is a 80 y.o. male, DOB - [birth year], EGB:[id number]  Admit date - 07/26/2020   Admitting Physician Renee Pain, MD  Outpatient Primary MD for the patient is [name], [name], MD  Outpatient specialists:   LOS - 11  days   Medical records reviewed and are as summarized below:    Chief Complaint  Patient presents with  . Fall  . Altered Mental Status       Brief summary   80 year old male with history of COPD, stage I non-small cell lung cancer s/p radiation in 04/2020, dementia, paroxysmal A. fib on Eliquis, GERD, leukemia s/p bone marrow transplantation in 1990 was admitted to ICU under PCCM service with altered mental status after a fall and diarrhea.  He was found to be in shock/respiratory failure requiring BiPAP, acute kidney injury, rhabdomyolysis.  CT of the head and C-spine were negative for acute abnormalities.  CT of the abdomen and pelvis showed focal colitis without evidence of perforation.  Neurology and nephrology were consulted.  He was treated with bicarb drip.  EEG was negative for seizures.  He was treated with broad-spectrum antibiotics initially; stool was positive for rotavirus and enteropathogenic E. Coli, received Zithromax.  He was treated with intravenous fluids initially.  Urine output has been minimal and he has been getting intermittent Lasix recently by nephrology.  Mental status has improved; neurology signed off.  Patient was transferred to [facility] service on 07/31/2020.   Assessment & Plan   Acute on chronic blood loss anemia, ongoing GI bleed, hypotension  -Overnight patient started having clots with large BM, eliquis was placed on hold -Hemoglobin down to 6.2 this morning, ordered 2 units packed RBCs transfusion -Patient has had several BMs with thick dark clots since then. -BP now in 80s, placed on IV  fluid bolus, infusing 1/2 packed RBC transfusion -Placed on IV PPI drip, consulted with enterology. -Unable to get CTA abdomen due to renal dysfunction, creatinine 4.76.  Currently not stable for tagged RBC scan with hypotension. -Will give Kcentra for Eliquis reversal, last Eliquis dose at 2245 last night - will follow GI recommendations regarding endoscopy.  CCM consulted for hemorrhagic shock, BP in 80s   Acute kidney injury: Likely secondary from ATN, septic shock and rhabdomyolysis -Initially treated with IV fluids, subsequently with intermittent Lasix.  Nonoliguric -Nephrology following, creatinine appears to be trending down -Creatinine improving, 4.7 today, hopefully renal recovery -CK has trended down. -Renal ultrasound negative for obstruction, continue to hold losartan, avoid nephrotoxins   Acute respiratory failure with hypercarbia and hypoxia, acute COPD exacerbation -On 3/17, patient found to be in hypercarbic respiratory failure, lethargic not responding to verbal commands.  ABG showed pH of 7.2, PCO2 70.1.  Overnight patient had refused BiPAP -Placed on BiPAP, alert and oriented this morning - 2D echo showed EF of 55 to 60%.  - Patient needs to have BiPAP QHS and PRN, however had been refusing.  Acute metabolic encephalopathy superimposed on dementia,  myoclonic jerks  -Likely due to hypercarbic respiratory failure, now improved, alert and oriented this morning.  Patient follows neurology outpatient, Dr. [name].  --CT head negative for acute intracranial pathology -Melatonin discontinued avoid sedating meds, BiPAP nightly -Patient had EEG during admission, negative for seizures. MRI was initially deferred due to rapid improvement in his mental status. -Continue Namenda, Aricept, fall precautions  Septic shock, present on admission.  Rotavirus and enteropathogenic E. coli diarrhea -Initially treated with aggressive IV fluid hydration, broad-spectrum antibiotics -Also  received empiric antibiotics for meningitis due to altered mental status, DC'd on 07/27/2020. -Blood cultures, urine cultures negative, Covid/flu negative, C. difficile negative -GI PCR was positive for rotavirus/enteropathogenic E. coli, received a dose of Zithromax on 07/28/2020 -Antibiotics have been subsequently discontinued.     History of stage I right lung cancer status post XRT -Outpatient follow-up with oncology  Paroxysmal atrial fibrillation -Rate controlled, digoxin on hold -Hold Eliquis  Leukocytosis -Resolved  Thrombocytopenia -Likely due to septic shock, currently improved   GERD -Continue PPI  Obesity Estimated body mass index is 29.59 kg/m as calculated from the following:   Height as of this encounter: 6' (1.829 m).   Weight as of this encounter: 99 kg.  Code Status: DNR DVT Prophylaxis:  SCDs Start: 07/26/20 2041   Level of Care: Level of care: Progressive Family Communication: Discussed all imaging results, lab results, explained to the patient's daughter, [name] on the phone today   Disposition Plan:     Status is: Inpatient  Remains inpatient appropriate because:Inpatient level of care appropriate due to severity of illness   Dispo: The patient is from: Home              Anticipated d/c is to: SNF              Patient currently is not medically stable to d/c.  Acute ongoing GI bleed   Difficult to place patient No      Time Spent in minutes 35 minutes  Procedures:  2D echo  Consultants:   Nephrology CCM  Antimicrobials:   Anti-infectives (From admission, onward)   Start     Dose/Rate Route Frequency Ordered Stop   07/28/20 0845  azithromycin (ZITHROMAX) tablet 1,000 mg        1,000 mg Oral  Once 07/28/20 0747 07/28/20 0827   07/28/20 0608  cefTRIAXone (ROCEPHIN) 2 g in sodium chloride 0.9 % 100 mL IVPB  Status:  Discontinued        2 g 200 mL/hr over 30 Minutes Intravenous Every 24 hours 07/27/20 1447 07/28/20 0747    07/27/20 1045  metroNIDAZOLE (FLAGYL) IVPB 500 mg        500 mg 100 mL/hr over 60 Minutes Intravenous  Once 07/27/20 0956 07/27/20 1113   07/26/20 1930  acyclovir (ZOVIRAX) 775 mg in dextrose 5 % 150 mL IVPB  Status:  Discontinued        10 mg/kg  77.6 kg (Ideal) 165.5 mL/hr over 60 Minutes Intravenous Every 24 hours 07/26/20 1848 07/27/20 1424   07/26/20 1915  ampicillin (OMNIPEN) 2 g in sodium chloride 0.9 % 100 mL IVPB  Status:  Discontinued        2 g 300 mL/hr over 20 Minutes Intravenous Every 8 hours 07/26/20 1848 07/27/20 1424   07/26/20 1900  cefTRIAXone (ROCEPHIN) 2 g in sodium chloride 0.9 % 100 mL IVPB  Status:  Discontinued        2 g 200 mL/hr over 30 Minutes  Intravenous Every 12 hours 07/26/20 1846 07/27/20 1447   07/26/20 1851  vancomycin variable dose per unstable renal function (pharmacist dosing)  Status:  Discontinued         Does not apply See admin instructions 07/26/20 1853 07/27/20 1424   07/26/20 1815  ceFEPIme (MAXIPIME) 2 g in sodium chloride 0.9 % 100 mL IVPB  Status:  Discontinued        2 g 200 mL/hr over 30 Minutes Intravenous  Once 07/26/20 1802 07/26/20 1846   07/26/20 1815  metroNIDAZOLE (FLAGYL) IVPB 500 mg        500 mg 100 mL/hr over 60 Minutes Intravenous  Once 07/26/20 1802 07/26/20 2049   07/26/20 1815  vancomycin (VANCOREADY) IVPB 1750 mg/350 mL        1,750 mg 175 mL/hr over 120 Minutes Intravenous  Once 07/26/20 1802 07/26/20 2228         Medications  Scheduled Meds: . sodium chloride   Intravenous Once  . budesonide (PULMICORT) nebulizer solution  0.5 mg Nebulization BID  . chlorhexidine  15 mL Mouth Rinse BID  . Chlorhexidine Gluconate Cloth  6 each Topical Q0600  . donepezil  5 mg Oral QHS  . mouth rinse  15 mL Mouth Rinse q12n4p  . memantine  5 mg Oral BID  . [START ON 08/09/2020] pantoprazole  40 mg Intravenous Q12H   Continuous Infusions: . sodium chloride 10 mL/hr at 07/30/20 1800  . lactated ringers 10 mL/hr at 07/30/20 1800   . pantoprozole (PROTONIX) infusion 8 mg/hr (08/06/20 1045)   PRN Meds:.acetaminophen, albuterol, Gerhardt's butt cream, loperamide, polyethylene glycol      Subjective:   Todd Lloyd was seen and examined today.  Acute ongoing GI bleed, patient had a BM during encounter with clots.  No chest pain or acute shortness of breath.  He is much more alert and oriented than yesterday.  No abdominal pain  Objective:   Vitals:   08/06/20 1000 08/06/20 1027 08/06/20 1028 08/06/20 1042  BP: (!) 80/40 (!) 91/42  (!) 87/33  Pulse: 97 97 (!) 124 (!) 101  Resp: 19 20 (!) 28 19  Temp: 97.9 F (36.6 C) 97.9 F (36.6 C)  97.9 F (36.6 C)  TempSrc: Oral Oral  Oral  SpO2: 99% 94% 100% 100%  Weight:      Height:        Intake/Output Summary (Last 24 hours) at 08/06/2020 1104 Last data filed at 08/06/2020 0336 Gross per 24 hour  Intake --  Output 1450 ml  Net -1450 ml     Wt Readings from Last 3 Encounters:  08/06/20 99 kg  04/22/20 93.6 kg  04/19/20 93.9 kg   Physical Exam  General: Much more alert and oriented from yesterday.  Following verbal commands.  Cardiovascular: S1 S2 clear, RRR. 1+ pedal edema b/l  Respiratory: Decreased breath sound at the bases  Gastrointestinal: Soft, nontender, nondistended, NBS  Ext: + pedal edema bilaterally  Neuro: no new deficits  Musculoskeletal: No cyanosis, clubbing  Skin: No rashes  Psych: Normal affect and demeanor    Data Reviewed:  I have personally reviewed following labs and imaging studies  Micro Results Recent Results (from the past 240 hour(s))  MRSA PCR Screening     Status: None   Collection Time: 07/27/20  7:21 PM   Specimen: Nasal Mucosa; Nasopharyngeal  Result Value Ref Range Status   MRSA by PCR NEGATIVE NEGATIVE Final    Comment:  The GeneXpert MRSA Assay (FDA approved for NASAL specimens only), is one component of a comprehensive MRSA colonization surveillance program. It is not intended to  diagnose MRSA infection nor to guide or monitor treatment for MRSA infections. Performed at [facility], [place] [address], [place], [place] [address]     Radiology Reports CT HEAD WO CONTRAST  Result Date: 08/05/2020 CLINICAL DATA:  Expressive aphasia with stroke suspected EXAM: CT HEAD WITHOUT CONTRAST TECHNIQUE: Contiguous axial images were obtained from the base of the skull through the vertex without intravenous contrast. COMPARISON:  Ten days ago FINDINGS: Brain: No evidence of acute infarction, hemorrhage, hydrocephalus, extra-axial collection or mass lesion/mass effect. Generalized atrophy. Age normal appearance of white matter. Vascular: No hyperdense vessel or unexpected calcification. Skull: Normal. Negative for fracture or focal lesion. Sinuses/Orbits: No acute finding. Bilateral scleral banding and cataract resection. IMPRESSION: Stable head CT. No visible infarct or other change from recent comparison. Electronically Signed   By: Monte Fantasia M.D.   On: 08/05/2020 05:01   CT HEAD WO CONTRAST  Result Date: 07/26/2020 CLINICAL DATA:  Trauma. Unwitnessed fall. EXAM: CT HEAD WITHOUT CONTRAST CT CERVICAL SPINE WITHOUT CONTRAST TECHNIQUE: Multidetector CT imaging of the head and cervical spine was performed following the standard protocol without intravenous contrast. Multiplanar CT image reconstructions of the cervical spine were also generated. COMPARISON:  CT cervical spine August 30, 2011. FINDINGS: CT HEAD FINDINGS Brain: No evidence of acute large vascular territory infarction, hemorrhage, hydrocephalus, extra-axial collection or mass lesion/mass effect. Mild for age scattered white matter hypodensities, most likely related to chronic microvascular ischemic disease. Mild generalized cerebral atrophy with ex vacuo ventricular dilation. Vascular: Calcific atherosclerosis. No hyperdense vessel identified. Skull: No acute fracture.  Left frontal scalp contusion Sinuses/Orbits:  Visualized sinuses are clear. Bilateral scleral buckles. Other: No mastoid effusions. CT CERVICAL SPINE FINDINGS Alignment: Mild anterolisthesis of C4 on C5, favor degenerative given facet arthropathy at this level. Broad dextrocurvature of the cervical spine. Skull base and vertebrae: Vertebral body heights are maintained. No evidence of acute fracture. Soft tissues and spinal canal: No prevertebral fluid or swelling. No visible canal hematoma. Disc levels: Left eccentric degenerative disc disease in the lower cervical spine, greatest at C7-T1 with disc height loss and endplate sclerosis. Bulky facet hypertrophy at multiple levels, greatest on the left at C4-C5 where there is likely at least moderate bony foraminal stenosis. Upper chest: Emphysema. Lung apices appear clear although evaluation is limited by motion. IMPRESSION: CT head: 1. No evidence of acute intracranial abnormality. 2. Left frontal scalp contusion without acute fracture. CT cervical spine: 1. No evidence of acute fracture. 2. Rotation of C1 on C2 is likely positional in the absence of a fixed torticollis. 3. Bulky facet hypertrophy at multiple levels, greatest on the left at C4-C5 where there is likely at least moderate bony foraminal stenosis. 4. Left eccentric degenerative disc disease in the lower cervical spine, greatest at C7-T1. Electronically Signed   By: Margaretha Sheffield MD   On: 07/26/2020 17:59   CT CERVICAL SPINE WO CONTRAST  Result Date: 07/26/2020 CLINICAL DATA:  Trauma. Unwitnessed fall. EXAM: CT HEAD WITHOUT CONTRAST CT CERVICAL SPINE WITHOUT CONTRAST TECHNIQUE: Multidetector CT imaging of the head and cervical spine was performed following the standard protocol without intravenous contrast. Multiplanar CT image reconstructions of the cervical spine were also generated. COMPARISON:  CT cervical spine August 30, 2011. FINDINGS: CT HEAD FINDINGS Brain: No evidence of acute large vascular territory infarction, hemorrhage,  hydrocephalus, extra-axial  collection or mass lesion/mass effect. Mild for age scattered white matter hypodensities, most likely related to chronic microvascular ischemic disease. Mild generalized cerebral atrophy with ex vacuo ventricular dilation. Vascular: Calcific atherosclerosis. No hyperdense vessel identified. Skull: No acute fracture.  Left frontal scalp contusion Sinuses/Orbits: Visualized sinuses are clear. Bilateral scleral buckles. Other: No mastoid effusions. CT CERVICAL SPINE FINDINGS Alignment: Mild anterolisthesis of C4 on C5, favor degenerative given facet arthropathy at this level. Broad dextrocurvature of the cervical spine. Skull base and vertebrae: Vertebral body heights are maintained. No evidence of acute fracture. Soft tissues and spinal canal: No prevertebral fluid or swelling. No visible canal hematoma. Disc levels: Left eccentric degenerative disc disease in the lower cervical spine, greatest at C7-T1 with disc height loss and endplate sclerosis. Bulky facet hypertrophy at multiple levels, greatest on the left at C4-C5 where there is likely at least moderate bony foraminal stenosis. Upper chest: Emphysema. Lung apices appear clear although evaluation is limited by motion. IMPRESSION: CT head: 1. No evidence of acute intracranial abnormality. 2. Left frontal scalp contusion without acute fracture. CT cervical spine: 1. No evidence of acute fracture. 2. Rotation of C1 on C2 is likely positional in the absence of a fixed torticollis. 3. Bulky facet hypertrophy at multiple levels, greatest on the left at C4-C5 where there is likely at least moderate bony foraminal stenosis. 4. Left eccentric degenerative disc disease in the lower cervical spine, greatest at C7-T1. Electronically Signed   By: Margaretha Sheffield MD   On: 07/26/2020 17:59   US RENAL  Result Date: 07/28/2020 CLINICAL DATA:  Acute kidney injury EXAM: RENAL / URINARY TRACT ULTRASOUND COMPLETE COMPARISON:  None. FINDINGS: Right  Kidney: Renal measurements: 10 x 6.2 x 5.7 cm = volume: 184.4 mL. Echogenicity within normal limits. No mass or hydronephrosis visualized. Left Kidney: Renal measurements: 10.2 x 6.6 x 6 cm = volume: 211.7 mL. Echogenicity within normal limits. 1.8 cm cyst at the upper pole. No hydronephrosis visualized. Bladder: Decompressed and not visualized Other: None. IMPRESSION: No hydronephrosis. Electronically Signed   By: Macy Mis M.D.   On: 07/28/2020 16:23   DG Pelvis Portable  Result Date: 07/26/2020 CLINICAL DATA:  Recent fall with pelvic pain, initial encounter EXAM: PORTABLE PELVIS 1-2 VIEWS COMPARISON:  02/24/2020 FINDINGS: Pelvic ring is intact. No acute fracture or dislocation is noted. Postsurgical changes are seen. No soft tissue abnormality is noted. Degenerative changes of lumbar spine are noted as well. Left upper quadrant calcification is noted similar to that seen on prior PET-CT. IMPRESSION: No acute abnormality noted. Electronically Signed   By: Inez Catalina M.D.   On: 07/26/2020 17:55   DG Chest Port 1 View  Result Date: 07/30/2020 CLINICAL DATA:  Respiratory failure EXAM: PORTABLE CHEST 1 VIEW COMPARISON:  07/26/2020 FINDINGS: The lungs are symmetrically well inflated. Minimal left basilar atelectasis. Mild coarsening of the pulmonary interstitium is again noted. No pneumothorax or pleural effusion. Cardiac size is mildly enlarged. Pulmonary vasculature is normal. No acute bone abnormality. IMPRESSION: No active disease.  Stable cardiomegaly. Electronically Signed   By: Fidela Salisbury MD   On: 07/30/2020 07:04   DG Chest Port 1 View  Result Date: 07/26/2020 CLINICAL DATA:  Recent fall EXAM: PORTABLE CHEST 1 VIEW COMPARISON:  07/06/2020 FINDINGS: Cardiac shadow is mildly enlarged but stable. Aortic calcifications are seen. Known right lower lobe mass lesion is not well appreciated on this exam. No infiltrate or sizable effusion is seen. No acute bony abnormality is noted. IMPRESSION: No  acute abnormality noted. Electronically Signed   By: Inez Catalina M.D.   On: 07/26/2020 17:57   EEG adult  Result Date: 07/27/2020 Lora Havens, MD     07/27/2020 10:35 AM Patient Name: Todd Lloyd MRN: [id number] Epilepsy Attending: Lora Havens Referring Physician/Provider: Dr. Kerney Elbe Date: 07/27/2020 Duration: 23.04 mins  Patient history: 80yo M with ams. EEG to evaluate for seizure  Level of alertness: Awake  AEDs during EEG study: None  Technical aspects: This EEG study was done with scalp electrodes positioned according to the 10-20 International system of electrode placement. Electrical activity was acquired at a sampling rate of 500Hz  and reviewed with a high frequency filter of 70Hz  and a low frequency filter of 1Hz . EEG data were recorded continuously and digitally stored.  Description: EEG showed continuous generalized 4.5-5Hz  theta slowing. Hyperventilation and photic stimulation were not performed.    ABNORMALITY -Continuous slow, generalized  IMPRESSION: This study is suggestive of moderate diffuse encephalopathy, nonspecific etiology. No seizures or epileptiform discharges were seen throughout the recording.   Lora Havens   ECHOCARDIOGRAM LIMITED  Result Date: 07/27/2020    ECHOCARDIOGRAM LIMITED REPORT   Patient Name:   KENNEY GOING Date of Exam: 07/27/2020 Medical Rec #:  [id number]          Height:       72.0 in Accession #:    [id number]         Weight:       214.5 lb Date of Birth:  Sep 30, 1940          BSA:          2.195 m Patient Age:    68 years           BP:           105/57 mmHg Patient Gender: M                  HR:           77 bpm. Exam Location:  Inpatient Procedure: Limited Echo, Cardiac Doppler and Color Doppler Indications:    Congestive Heart Failure I50.9  History:        Patient has prior history of Echocardiogram examinations, most                 recent 03/03/2020. COPD, Arrythmias:Atrial Fibrillation; Risk                  Factors:Hypertension, Dyslipidemia and Former Smoker.  Sonographer:    Vickie Epley RDCS Referring Phys: [place]  1. Left ventricular ejection fraction, by estimation, is 55 to 60%. The left ventricle has normal function. Left ventricular diastolic function could not be evaluated.  2. The mitral valve is grossly normal. No evidence of mitral stenosis.  3. Tricuspid valve regurgitation is mild to moderate.  4. The aortic valve is grossly normal. Aortic valve regurgitation is trivial. No aortic stenosis is present.  5. There is moderately elevated pulmonary artery systolic pressure. FINDINGS  Left Ventricle: Left ventricular ejection fraction, by estimation, is 55 to 60%. The left ventricle has normal function. Left ventricular diastolic function could not be evaluated. Left ventricular diastolic function could not be evaluated due to atrial  fibrillation. Right Ventricle: There is moderately elevated pulmonary artery systolic pressure. The tricuspid regurgitant velocity is 2.77 m/s, and with an assumed right atrial pressure of 15 mmHg, the estimated right ventricular systolic pressure is 62.6 mmHg. Mitral Valve: The  mitral valve is grossly normal. No evidence of mitral valve stenosis. Tricuspid Valve: The tricuspid valve is normal in structure. Tricuspid valve regurgitation is mild to moderate. Aortic Valve: The aortic valve is grossly normal. Aortic valve regurgitation is trivial. No aortic stenosis is present. Pulmonic Valve: The pulmonic valve was normal in structure. Pulmonic valve regurgitation is trivial. Aorta: The aortic root and ascending aorta are structurally normal, with no evidence of dilitation. LEFT VENTRICLE PLAX 2D LVIDd:         5.00 cm LVIDs:         3.80 cm LV PW:         1.00 cm LV IVS:        1.00 cm LVOT diam:     2.40 cm LVOT Area:     4.52 cm  LV Volumes (MOD) LV vol d, MOD A2C: 112.0 ml LV vol d, MOD A4C: 101.0 ml LV vol s, MOD A2C: 56.8 ml LV vol s, MOD A4C:  55.4 ml LV SV MOD A2C:     55.2 ml LV SV MOD A4C:     101.0 ml LV SV MOD BP:      49.6 ml RIGHT VENTRICLE TAPSE (M-mode): 1.5 cm LEFT ATRIUM         Index LA diam:    5.10 cm 2.32 cm/m   AORTA Ao Root diam: 3.80 cm Ao Asc diam:  3.60 cm TRICUSPID VALVE TR Peak grad:   30.7 mmHg TR Vmax:        277.00 cm/s  SHUNTS Systemic Diam: 2.40 cm Mertie Moores MD Electronically signed by Mertie Moores MD Signature Date/Time: 07/27/2020/3:32:59 PM    Final    CT CHEST ABDOMEN PELVIS WO CONTRAST  Result Date: 07/26/2020 CLINICAL DATA:  Sepsis and leukocytosis, recent fall, history of lung carcinoma EXAM: CT CHEST, ABDOMEN AND PELVIS WITHOUT CONTRAST TECHNIQUE: Multidetector CT imaging of the chest, abdomen and pelvis was performed following the standard protocol without IV contrast. COMPARISON:  Chest x-ray and pelvis film from earlier in the same day., 07/06/2020 FINDINGS: CT CHEST FINDINGS Cardiovascular: Atherosclerotic calcifications of the aorta are noted without aneurysmal dilatation. No cardiac enlargement is seen. No pericardial effusion is noted. 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 Mediastinum/Nodes: Thoracic inlet is within normal limits. No sizable hilar or mediastinal adenopathy is noted. The esophagus as visualized is within normal limits. Lungs/Pleura: Left lung is well aerated with mild emphysematous changes. No focal infiltrate or sizable effusion is seen. Previously seen bilobed right lower lobe mass lesion is again identified and stable. Small right-sided pleural effusion is noted stable from the prior exam. Musculoskeletal: Degenerative changes of the thoracic spine are noted. No acute rib abnormality is seen. CT ABDOMEN PELVIS FINDINGS Hepatobiliary: No focal liver abnormality is seen. No gallstones, gallbladder wall thickening, or biliary dilatation. Pancreas: Unremarkable. No pancreatic ductal dilatation or surrounding inflammatory changes. Spleen: Normal in size without focal abnormality.  Adrenals/Urinary Tract: Adrenal glands again demonstrate calcification on the left consistent with prior adrenal hematoma. The overall appearance is stable. Right adrenal gland is within normal limits. Kidneys are well visualized bilaterally. Tiny nonobstructing left renal stones are noted. Ureters are within normal limits. Bladder is decompressed by Foley catheter. No obstructive changes are seen. Stomach/Bowel: Colon is well visualized with some fluid within. Considerable wall thickening is noted within the ascending colon new from prior PET-CT from 02/24/2020. These changes are consistent with focal colitis without evidence of perforation. Some pericolonic inflammatory changes are seen. Small bowel and stomach  appear within normal limits. The appendix is not well visualized. Vascular/Lymphatic: Aortic atherosclerosis. No enlarged abdominal or pelvic lymph nodes. Reproductive: Prostate is unremarkable. Other: No abdominal wall hernia or abnormality. No abdominopelvic ascites. Musculoskeletal: No acute or significant osseous findings. IMPRESSION: CT of the chest: Stable right lower lobe mass lesion with associated right effusion similar to that seen on the prior exam. CT of the abdomen and pelvis: Tiny nonobstructing left renal stone. Changes of ascending colitis without evidence of perforation or abscess formation. Stable calcified left adrenal hematoma. Aortic Atherosclerosis (ICD10-I70.0) and Emphysema (ICD10-J43.9). Electronically Signed   By: Inez Catalina M.D.   On: 07/26/2020 23:08    Lab Data:  CBC: Recent Labs  Lab 08/03/20 0209 08/04/20 0226 08/05/20 0301 08/06/20 0211 08/06/20 0751  WBC 3.4* 3.4* 5.2 5.7 6.2  HGB 7.9* 8.1* 8.3* 7.2* 6.2*  HCT 26.0* 26.2* 27.4* 25.3* 21.1*  MCV 89.7 90.3 92.3 94.8 92.1  PLT 140* 179 218 225 485   Basic Metabolic Panel: Recent Labs  Lab 07/31/20 2133 08/01/20 0458 08/02/20 0334 08/03/20 0209 08/04/20 0226 08/05/20 0301 08/05/20 0948  08/06/20 0211  NA 138   < > 140 142 143 144  --  145  K 3.7   < > 3.7 3.7 3.9 4.0  --  4.1  CL 101   < > 102 103 103 102  --  103  CO2 26   < > 26 27 29 30   --  34*  GLUCOSE 120*   < > 130* 100* 112* 104*  --  139*  BUN 75*   < > 82* 85* 86* 85*  --  88*  CREATININE 8.11*   < > 8.78* 8.57* 7.36* 5.78*  --  4.76*  CALCIUM 7.1*   < > 7.4* 7.7* 7.9* 8.0*  --  8.1*  MG 1.9  --   --   --   --  1.3* 1.7  --   PHOS  --    < > 6.9* 6.8* 6.6* 6.5*  --  6.1*   < > = values in this interval not displayed.   GFR: Estimated Creatinine Clearance: 15.1 mL/min (A) (by C-G formula based on SCr of 4.76 mg/dL (H)). Liver Function Tests: Recent Labs  Lab 08/02/20 0334 08/03/20 0209 08/04/20 0226 08/05/20 0301 08/06/20 0211  ALBUMIN 2.5* 2.6* 2.8* 3.0* 2.7*   No results for input(s): LIPASE, AMYLASE in the last 168 hours. No results for input(s): AMMONIA in the last 168 hours. Coagulation Profile: No results for input(s): INR, PROTIME in the last 168 hours. Cardiac Enzymes: No results for input(s): CKTOTAL, CKMB, CKMBINDEX, TROPONINI in the last 168 hours. BNP (last 3 results) No results for input(s): PROBNP in the last 8760 hours. HbA1C: No results for input(s): HGBA1C in the last 72 hours. CBG: Recent Labs  Lab 08/05/20 1626 08/05/20 2027 08/05/20 2356 08/06/20 0339 08/06/20 0730  GLUCAP 91 96 167* 113* 129*   Lipid Profile: No results for input(s): CHOL, HDL, LDLCALC, TRIG, CHOLHDL, LDLDIRECT in the last 72 hours. Thyroid Function Tests: No results for input(s): TSH, T4TOTAL, FREET4, T3FREE, THYROIDAB in the last 72 hours. Anemia Panel: Recent Labs    08/04/20 0226  FERRITIN 65  TIBC 246*  IRON 86  RETICCTPCT 0.8   Urine analysis:    Component Value Date/Time   COLORURINE AMBER (A) 07/26/2020 2153   APPEARANCEUR CLOUDY (A) 07/26/2020 2153   LABSPEC 1.016 07/26/2020 2153   PHURINE 5.0 07/26/2020 2153   GLUCOSEU NEGATIVE 07/26/2020  2153   HGBUR LARGE (A) 07/26/2020 2153    BILIRUBINUR NEGATIVE 07/26/2020 2153   KETONESUR NEGATIVE 07/26/2020 2153   PROTEINUR 100 (A) 07/26/2020 2153   UROBILINOGEN 0.2 11/13/2013 1429   NITRITE NEGATIVE 07/26/2020 2153   LEUKOCYTESUR NEGATIVE 07/26/2020 2153     Apple Dearmas M.D. [facility] 08/06/2020, 11:04 AM  Available via Epic secure chat 7am-7pm After 7 pm, please refer to night coverage provider listed on amion.

## 2020-08-07 DIAGNOSIS — G8929 Other chronic pain: Secondary | ICD-10-CM

## 2020-08-07 DIAGNOSIS — J9602 Acute respiratory failure with hypercapnia: Secondary | ICD-10-CM | POA: Diagnosis not present

## 2020-08-07 DIAGNOSIS — R1031 Right lower quadrant pain: Secondary | ICD-10-CM | POA: Diagnosis not present

## 2020-08-07 DIAGNOSIS — K921 Melena: Secondary | ICD-10-CM | POA: Diagnosis not present

## 2020-08-07 DIAGNOSIS — Z7189 Other specified counseling: Secondary | ICD-10-CM | POA: Diagnosis not present

## 2020-08-07 DIAGNOSIS — D62 Acute posthemorrhagic anemia: Secondary | ICD-10-CM | POA: Diagnosis not present

## 2020-08-07 DIAGNOSIS — Z515 Encounter for palliative care: Secondary | ICD-10-CM | POA: Diagnosis not present

## 2020-08-07 DIAGNOSIS — R935 Abnormal findings on diagnostic imaging of other abdominal regions, including retroperitoneum: Secondary | ICD-10-CM | POA: Diagnosis not present

## 2020-08-07 LAB — CBC
HCT: 19.9 % — ABNORMAL LOW (ref 39.0–52.0)
Hemoglobin: 6.1 g/dL — CL (ref 13.0–17.0)
MCH: 27.6 pg (ref 26.0–34.0)
MCHC: 30.7 g/dL (ref 30.0–36.0)
MCV: 90 fL (ref 80.0–100.0)
Platelets: 198 10*3/uL (ref 150–400)
RBC: 2.21 MIL/uL — ABNORMAL LOW (ref 4.22–5.81)
RDW: 18.6 % — ABNORMAL HIGH (ref 11.5–15.5)
WBC: 4.5 10*3/uL (ref 4.0–10.5)
nRBC: 0 % (ref 0.0–0.2)

## 2020-08-07 LAB — GLUCOSE, CAPILLARY
Glucose-Capillary: 182 mg/dL — ABNORMAL HIGH (ref 70–99)
Glucose-Capillary: 84 mg/dL (ref 70–99)
Glucose-Capillary: 111 mg/dL — ABNORMAL HIGH (ref 70–99)
Glucose-Capillary: 109 mg/dL — ABNORMAL HIGH (ref 70–99)
Glucose-Capillary: 133 mg/dL — ABNORMAL HIGH (ref 70–99)

## 2020-08-07 LAB — RENAL FUNCTION PANEL
Albumin: 2 g/dL — ABNORMAL LOW (ref 3.5–5.0)
Anion gap: 7 (ref 5–15)
BUN: 90 mg/dL — ABNORMAL HIGH (ref 8–23)
CO2: 31 mmol/L (ref 22–32)
Calcium: 7.6 mg/dL — ABNORMAL LOW (ref 8.9–10.3)
Chloride: 106 mmol/L (ref 98–111)
Creatinine, Ser: 4.34 mg/dL — ABNORMAL HIGH (ref 0.61–1.24)
GFR, Estimated: 13 mL/min — ABNORMAL LOW (ref >60)
Glucose, Bld: 265 mg/dL — ABNORMAL HIGH (ref 70–99)
Phosphorus: 5 mg/dL — ABNORMAL HIGH (ref 2.5–4.6)
Potassium: 3.5 mmol/L (ref 3.5–5.1)
Sodium: 144 mmol/L (ref 135–145)

## 2020-08-07 LAB — HEMOGLOBIN AND HEMATOCRIT, BLOOD
HCT: 19.4 % — ABNORMAL LOW (ref 39.0–52.0)
HCT: 24 % — ABNORMAL LOW (ref 39.0–52.0)
Hemoglobin: 6.1 g/dL — CL (ref 13.0–17.0)
Hemoglobin: 7.7 g/dL — ABNORMAL LOW (ref 13.0–17.0)

## 2020-08-07 LAB — PREPARE RBC (CROSSMATCH)

## 2020-08-07 MED ORDER — SODIUM CHLORIDE 0.9% IV SOLUTION: Freq: Once | INTRAVENOUS | Status: AC

## 2020-08-07 MED ORDER — FUROSEMIDE 10 MG/ML IJ SOLN
20.0000 mg | Freq: Once | INTRAMUSCULAR | Status: AC
  Administered 2020-08-07: 20 mg via INTRAVENOUS
  Filled 2020-08-07: qty 2

## 2020-08-07 NOTE — Progress Notes (Signed)
[facility]                                                                              Patient Demographics  Todd Lloyd, is a 80 y.o. male, DOB - Jan 16, 1941, MHD:[id number]  Admit date - 07/26/2020   Admitting Physician Todd Pain, MD  Outpatient Primary MD for the patient is [name], [name], MD  Outpatient specialists:   LOS - 12  days   Medical records reviewed and are as summarized below:    Chief Complaint  Patient presents with  . Fall  . Altered Mental Status       Brief summary   80 year old male with history of COPD, stage I non-small cell lung cancer s/p radiation in 04/2020, dementia, paroxysmal A. fib on Eliquis, GERD, leukemia s/p bone marrow transplantation in 1990 was admitted to ICU under PCCM service with altered mental status after a fall and diarrhea.  He was found to be in shock/respiratory failure requiring BiPAP, acute kidney injury, rhabdomyolysis.  CT of the head and C-spine were negative for acute abnormalities.  CT of the abdomen and pelvis showed focal colitis without evidence of perforation.  Neurology and nephrology were consulted.  He was treated with bicarb drip.  EEG was negative for seizures.  He was treated with broad-spectrum antibiotics initially; stool was positive for rotavirus and enteropathogenic E. Coli, received Zithromax.  He was treated with intravenous fluids initially.  Urine output has been minimal and he has been getting intermittent Lasix recently by nephrology.  Mental status has improved; neurology signed off.  Patient was transferred to [facility] service on 07/31/2020.  08/07/2020: Patient seen.  Discussed with patient and spouse.  Patient is currently being transfused packed red blood cells.  No further rectal bleeding reported.  Last hemoglobin prior to blood transfusion was 6.1 g/dL.  We will continue to monitor H&H.   Assessment & Plan   Acute on chronic blood loss anemia, ongoing GI bleed, hypotension   -Overnight patient started having clots with large BM, eliquis was placed on hold -Hemoglobin down to 6.2 this morning, ordered 2 units packed RBCs transfusion -Patient has had several BMs with thick dark clots since then. -BP now in 80s, placed on IV fluid bolus, infusing 1/2 packed RBC transfusion -Placed on IV PPI drip, consulted with enterology. -Unable to get CTA abdomen due to renal dysfunction, creatinine 4.76.  Currently not stable for tagged RBC scan with hypotension. -Will give Kcentra for Eliquis reversal, last Eliquis dose at 2245 last night - will follow GI recommendations regarding endoscopy.  CCM consulted for hemorrhagic shock, BP in 80s 08/07/2020: No further bleeding reported.  Currently being transfused packed red blood cells.  We will continue to monitor H&H.   Acute kidney injury: Likely secondary from ATN, septic shock and rhabdomyolysis -Initially treated with IV fluids, subsequently with intermittent Lasix.  Nonoliguric -Nephrology following, creatinine appears to be trending down -Creatinine improving, 4.7 today, hopefully renal recovery -CK has trended down. -Renal ultrasound negative for obstruction, continue to hold losartan, avoid nephrotoxins 08/07/2020: AKI is multifactorial.  Full recovery, if possible, will take a long time.  Acute respiratory failure with hypercarbia and hypoxia, acute COPD exacerbation -On 3/17, patient found to be in hypercarbic respiratory failure, lethargic not responding to verbal commands.  ABG showed pH of 7.2, PCO2 70.1.  Overnight patient had refused BiPAP -Placed on BiPAP, alert and oriented this morning - 2D echo showed EF of 55 to 60%.  - Patient needs to have BiPAP QHS and PRN, however had been refusing.  Acute metabolic encephalopathy superimposed on dementia, myoclonic jerks  -Likely due to hypercarbic respiratory failure, now improved, alert and oriented this morning.  Patient follows neurology outpatient, Dr. [name].   --CT head negative for acute intracranial pathology -Melatonin discontinued avoid sedating meds, BiPAP nightly -Patient had EEG during admission, negative for seizures. MRI was initially deferred due to rapid improvement in his mental status. -Continue Namenda, Aricept, fall precautions  Septic shock, present on admission.  Rotavirus and enteropathogenic E. coli diarrhea -Initially treated with aggressive IV fluid hydration, broad-spectrum antibiotics -Also received empiric antibiotics for meningitis due to altered mental status, DC'd on 07/27/2020. -Blood cultures, urine cultures negative, Covid/flu negative, C. difficile negative -GI PCR was positive for rotavirus/enteropathogenic E. coli, received a dose of Zithromax on 07/28/2020 -Antibiotics have been subsequently discontinued.     History of stage I right lung cancer status post XRT -Outpatient follow-up with oncology  Paroxysmal atrial fibrillation -Rate controlled, digoxin on hold -Hold Eliquis  Leukocytosis -Resolved  Thrombocytopenia  GERD -Continue PPI  Obesity Estimated body mass index is 29.74 kg/m as calculated from the following:   Height as of this encounter: 6' (1.829 m).   Weight as of this encounter: 99.5 kg.  Code Status: DNR DVT Prophylaxis:  SCDs Start: 07/26/20 2041   Level of Care: Level of care: Progressive Family Communication: Discussed all imaging results, lab results, explained to the patient's daughter, Ms Todd Lloyd on the phone today   Disposition Plan:     Status is: Inpatient  Remains inpatient appropriate because:Inpatient level of care appropriate due to severity of illness   Dispo: The patient is from: Home              Anticipated d/c is to: SNF              Patient currently is not medically stable to d/c.  Acute ongoing GI bleed   Difficult to place patient No      Time Spent in minutes 35 minutes  Procedures:  2D echo  Consultants:    Nephrology CCM  Antimicrobials:   Anti-infectives (From admission, onward)   Start     Dose/Rate Route Frequency Ordered Stop   07/28/20 0845  azithromycin (ZITHROMAX) tablet 1,000 mg        1,000 mg Oral  Once 07/28/20 0747 07/28/20 0827   07/28/20 0608  cefTRIAXone (ROCEPHIN) 2 g in sodium chloride 0.9 % 100 mL IVPB  Status:  Discontinued        2 g 200 mL/hr over 30 Minutes Intravenous Every 24 hours 07/27/20 1447 07/28/20 0747   07/27/20 1045  metroNIDAZOLE (FLAGYL) IVPB 500 mg        500 mg 100 mL/hr over 60 Minutes Intravenous  Once 07/27/20 0956 07/27/20 1113   07/26/20 1930  acyclovir (ZOVIRAX) 775 mg in dextrose 5 % 150 mL IVPB  Status:  Discontinued        10 mg/kg  77.6 kg (Ideal) 165.5 mL/hr over 60 Minutes Intravenous Every 24 hours 07/26/20 1848 07/27/20 1424   07/26/20 1915  ampicillin (OMNIPEN) 2  g in sodium chloride 0.9 % 100 mL IVPB  Status:  Discontinued        2 g 300 mL/hr over 20 Minutes Intravenous Every 8 hours 07/26/20 1848 07/27/20 1424   07/26/20 1900  cefTRIAXone (ROCEPHIN) 2 g in sodium chloride 0.9 % 100 mL IVPB  Status:  Discontinued        2 g 200 mL/hr over 30 Minutes Intravenous Every 12 hours 07/26/20 1846 07/27/20 1447   07/26/20 1851  vancomycin variable dose per unstable renal function (pharmacist dosing)  Status:  Discontinued         Does not apply See admin instructions 07/26/20 1853 07/27/20 1424   07/26/20 1815  ceFEPIme (MAXIPIME) 2 g in sodium chloride 0.9 % 100 mL IVPB  Status:  Discontinued        2 g 200 mL/hr over 30 Minutes Intravenous  Once 07/26/20 1802 07/26/20 1846   07/26/20 1815  metroNIDAZOLE (FLAGYL) IVPB 500 mg        500 mg 100 mL/hr over 60 Minutes Intravenous  Once 07/26/20 1802 07/26/20 2049   07/26/20 1815  vancomycin (VANCOREADY) IVPB 1750 mg/350 mL        1,750 mg 175 mL/hr over 120 Minutes Intravenous  Once 07/26/20 1802 07/26/20 2228         Medications  Scheduled Meds: . budesonide (PULMICORT)  nebulizer solution  0.5 mg Nebulization BID  . chlorhexidine  15 mL Mouth Rinse BID  . Chlorhexidine Gluconate Cloth  6 each Topical Q0600  . donepezil  5 mg Oral QHS  . mouth rinse  15 mL Mouth Rinse q12n4p  . memantine  5 mg Oral BID  . [START ON 08/09/2020] pantoprazole  40 mg Intravenous Q12H   Continuous Infusions: . sodium chloride 10 mL/hr at 07/30/20 1800  . lactated ringers 10 mL/hr at 07/30/20 1800  . pantoprozole (PROTONIX) infusion 8 mg/hr (08/07/20 1135)   PRN Meds:.acetaminophen, albuterol, Gerhardt's butt cream, loperamide, polyethylene glycol      Subjective:  No further bleeding reported.  Objective:   Vitals:   08/07/20 0700 08/07/20 0743 08/07/20 1014 08/07/20 1204  BP:   103/69 (!) 134/47  Pulse:   94 79  Resp:   19 18  Temp: 98.6 F (37 C)  98 F (36.7 C) 98.5 F (36.9 C)  TempSrc: Oral  Oral Oral  SpO2:  100%  100%  Weight:      Height:        Intake/Output Summary (Last 24 hours) at 08/07/2020 1218 Last data filed at 08/07/2020 1014 Gross per 24 hour  Intake 2254.29 ml  Output 500 ml  Net 1754.29 ml     Wt Readings from Last 3 Encounters:  08/07/20 99.5 kg  04/22/20 93.6 kg  04/19/20 93.9 kg   Physical Exam  General: Patient is awake and alert.  Patient is at the daily distress.  Patient is pale.   Cardiovascular: S1 S2   Respiratory: Decreased breath sound at the bases  Gastrointestinal: Soft, nontender, nondistended, NBS  Ext: Edema of the extremities.    Neuro: Patient is awake and alert.  Patient moves all extremities.   Data Reviewed:  I have personally reviewed following labs and imaging studies  Micro Results No results found for this or any previous visit (from the past 240 hour(s)).  Radiology Reports CT HEAD WO CONTRAST  Result Date: 08/05/2020 CLINICAL DATA:  Expressive aphasia with stroke suspected EXAM: CT HEAD WITHOUT CONTRAST TECHNIQUE: Contiguous axial images were  obtained from the base of the skull  through the vertex without intravenous contrast. COMPARISON:  Ten days ago FINDINGS: Brain: No evidence of acute infarction, hemorrhage, hydrocephalus, extra-axial collection or mass lesion/mass effect. Generalized atrophy. Age normal appearance of white matter. Vascular: No hyperdense vessel or unexpected calcification. Skull: Normal. Negative for fracture or focal lesion. Sinuses/Orbits: No acute finding. Bilateral scleral banding and cataract resection. IMPRESSION: Stable head CT. No visible infarct or other change from recent comparison. Electronically Signed   By: Monte Fantasia M.D.   On: 08/05/2020 05:01   CT HEAD WO CONTRAST  Result Date: 07/26/2020 CLINICAL DATA:  Trauma. Unwitnessed fall. EXAM: CT HEAD WITHOUT CONTRAST CT CERVICAL SPINE WITHOUT CONTRAST TECHNIQUE: Multidetector CT imaging of the head and cervical spine was performed following the standard protocol without intravenous contrast. Multiplanar CT image reconstructions of the cervical spine were also generated. COMPARISON:  CT cervical spine August 30, 2011. FINDINGS: CT HEAD FINDINGS Brain: No evidence of acute large vascular territory infarction, hemorrhage, hydrocephalus, extra-axial collection or mass lesion/mass effect. Mild for age scattered white matter hypodensities, most likely related to chronic microvascular ischemic disease. Mild generalized cerebral atrophy with ex vacuo ventricular dilation. Vascular: Calcific atherosclerosis. No hyperdense vessel identified. Skull: No acute fracture.  Left frontal scalp contusion Sinuses/Orbits: Visualized sinuses are clear. Bilateral scleral buckles. Other: No mastoid effusions. CT CERVICAL SPINE FINDINGS Alignment: Mild anterolisthesis of C4 on C5, favor degenerative given facet arthropathy at this level. Broad dextrocurvature of the cervical spine. Skull base and vertebrae: Vertebral body heights are maintained. No evidence of acute fracture. Soft tissues and spinal canal: No prevertebral  fluid or swelling. No visible canal hematoma. Disc levels: Left eccentric degenerative disc disease in the lower cervical spine, greatest at C7-T1 with disc height loss and endplate sclerosis. Bulky facet hypertrophy at multiple levels, greatest on the left at C4-C5 where there is likely at least moderate bony foraminal stenosis. Upper chest: Emphysema. Lung apices appear clear although evaluation is limited by motion. IMPRESSION: CT head: 1. No evidence of acute intracranial abnormality. 2. Left frontal scalp contusion without acute fracture. CT cervical spine: 1. No evidence of acute fracture. 2. Rotation of C1 on C2 is likely positional in the absence of a fixed torticollis. 3. Bulky facet hypertrophy at multiple levels, greatest on the left at C4-C5 where there is likely at least moderate bony foraminal stenosis. 4. Left eccentric degenerative disc disease in the lower cervical spine, greatest at C7-T1. Electronically Signed   By: Margaretha Sheffield MD   On: 07/26/2020 17:59   CT CERVICAL SPINE WO CONTRAST  Result Date: 07/26/2020 CLINICAL DATA:  Trauma. Unwitnessed fall. EXAM: CT HEAD WITHOUT CONTRAST CT CERVICAL SPINE WITHOUT CONTRAST TECHNIQUE: Multidetector CT imaging of the head and cervical spine was performed following the standard protocol without intravenous contrast. Multiplanar CT image reconstructions of the cervical spine were also generated. COMPARISON:  CT cervical spine August 30, 2011. FINDINGS: CT HEAD FINDINGS Brain: No evidence of acute large vascular territory infarction, hemorrhage, hydrocephalus, extra-axial collection or mass lesion/mass effect. Mild for age scattered white matter hypodensities, most likely related to chronic microvascular ischemic disease. Mild generalized cerebral atrophy with ex vacuo ventricular dilation. Vascular: Calcific atherosclerosis. No hyperdense vessel identified. Skull: No acute fracture.  Left frontal scalp contusion Sinuses/Orbits: Visualized sinuses are  clear. Bilateral scleral buckles. Other: No mastoid effusions. CT CERVICAL SPINE FINDINGS Alignment: Mild anterolisthesis of C4 on C5, favor degenerative given facet arthropathy at this level. Broad dextrocurvature of the cervical spine.  Skull base and vertebrae: Vertebral body heights are maintained. No evidence of acute fracture. Soft tissues and spinal canal: No prevertebral fluid or swelling. No visible canal hematoma. Disc levels: Left eccentric degenerative disc disease in the lower cervical spine, greatest at C7-T1 with disc height loss and endplate sclerosis. Bulky facet hypertrophy at multiple levels, greatest on the left at C4-C5 where there is likely at least moderate bony foraminal stenosis. Upper chest: Emphysema. Lung apices appear clear although evaluation is limited by motion. IMPRESSION: CT head: 1. No evidence of acute intracranial abnormality. 2. Left frontal scalp contusion without acute fracture. CT cervical spine: 1. No evidence of acute fracture. 2. Rotation of C1 on C2 is likely positional in the absence of a fixed torticollis. 3. Bulky facet hypertrophy at multiple levels, greatest on the left at C4-C5 where there is likely at least moderate bony foraminal stenosis. 4. Left eccentric degenerative disc disease in the lower cervical spine, greatest at C7-T1. Electronically Signed   By: Margaretha Sheffield MD   On: 07/26/2020 17:59   US RENAL  Result Date: 07/28/2020 CLINICAL DATA:  Acute kidney injury EXAM: RENAL / URINARY TRACT ULTRASOUND COMPLETE COMPARISON:  None. FINDINGS: Right Kidney: Renal measurements: 10 x 6.2 x 5.7 cm = volume: 184.4 mL. Echogenicity within normal limits. No mass or hydronephrosis visualized. Left Kidney: Renal measurements: 10.2 x 6.6 x 6 cm = volume: 211.7 mL. Echogenicity within normal limits. 1.8 cm cyst at the upper pole. No hydronephrosis visualized. Bladder: Decompressed and not visualized Other: None. IMPRESSION: No hydronephrosis. Electronically Signed    By: Macy Mis M.D.   On: 07/28/2020 16:23   DG Pelvis Portable  Result Date: 07/26/2020 CLINICAL DATA:  Recent fall with pelvic Lloyd, initial encounter EXAM: PORTABLE PELVIS 1-2 VIEWS COMPARISON:  02/24/2020 FINDINGS: Pelvic ring is intact. No acute fracture or dislocation is noted. Postsurgical changes are seen. No soft tissue abnormality is noted. Degenerative changes of lumbar spine are noted as well. Left upper quadrant calcification is noted similar to that seen on prior PET-CT. IMPRESSION: No acute abnormality noted. Electronically Signed   By: Inez Catalina M.D.   On: 07/26/2020 17:55   DG Chest Port 1 View  Result Date: 07/30/2020 CLINICAL DATA:  Respiratory failure EXAM: PORTABLE CHEST 1 VIEW COMPARISON:  07/26/2020 FINDINGS: The lungs are symmetrically well inflated. Minimal left basilar atelectasis. Mild coarsening of the pulmonary interstitium is again noted. No pneumothorax or pleural effusion. Cardiac size is mildly enlarged. Pulmonary vasculature is normal. No acute bone abnormality. IMPRESSION: No active disease.  Stable cardiomegaly. Electronically Signed   By: Fidela Salisbury MD   On: 07/30/2020 07:04   DG Chest Port 1 View  Result Date: 07/26/2020 CLINICAL DATA:  Recent fall EXAM: PORTABLE CHEST 1 VIEW COMPARISON:  07/06/2020 FINDINGS: Cardiac shadow is mildly enlarged but stable. Aortic calcifications are seen. Known right lower lobe mass lesion is not well appreciated on this exam. No infiltrate or sizable effusion is seen. No acute bony abnormality is noted. IMPRESSION: No acute abnormality noted. Electronically Signed   By: Inez Catalina M.D.   On: 07/26/2020 17:57   EEG adult  Result Date: 07/27/2020 Lora Havens, MD     07/27/2020 10:35 AM Patient Name: Todd Lloyd MRN: [id number] Epilepsy Attending: Lora Havens Referring Physician/Provider: Dr. Kerney Elbe Date: 07/27/2020 Duration: 23.04 mins  Patient history: 80yo M with ams. EEG to evaluate for seizure   Level of alertness: Awake  AEDs during EEG study: None  Technical aspects: This EEG study was done with scalp electrodes positioned according to the 10-20 International system of electrode placement. Electrical activity was acquired at a sampling rate of 500Hz  and reviewed with a high frequency filter of 70Hz  and a low frequency filter of 1Hz . EEG data were recorded continuously and digitally stored.  Description: EEG showed continuous generalized 4.5-5Hz  theta slowing. Hyperventilation and photic stimulation were not performed.    ABNORMALITY -Continuous slow, generalized  IMPRESSION: This study is suggestive of moderate diffuse encephalopathy, nonspecific etiology. No seizures or epileptiform discharges were seen throughout the recording.   Lora Havens   ECHOCARDIOGRAM LIMITED  Result Date: 07/27/2020    ECHOCARDIOGRAM LIMITED REPORT   Patient Name:   Todd Lloyd Date of Exam: 07/27/2020 Medical Rec #:  [id number]          Height:       72.0 in Accession #:    [id number]         Weight:       214.5 lb Date of Birth:  1940-12-09          BSA:          2.195 m Patient Age:    65 years           BP:           105/57 mmHg Patient Gender: M                  HR:           77 bpm. Exam Location:  Inpatient Procedure: Limited Echo, Cardiac Doppler and Color Doppler Indications:    Congestive Heart Failure I50.9  History:        Patient has prior history of Echocardiogram examinations, most                 recent 03/03/2020. COPD, Arrythmias:Atrial Fibrillation; Risk                 Factors:Hypertension, Dyslipidemia and Former Smoker.  Sonographer:    Vickie Epley RDCS Referring Phys: [place]  1. Left ventricular ejection fraction, by estimation, is 55 to 60%. The left ventricle has normal function. Left ventricular diastolic function could not be evaluated.  2. The mitral valve is grossly normal. No evidence of mitral stenosis.  3. Tricuspid valve regurgitation is mild to  moderate.  4. The aortic valve is grossly normal. Aortic valve regurgitation is trivial. No aortic stenosis is present.  5. There is moderately elevated pulmonary artery systolic pressure. FINDINGS  Left Ventricle: Left ventricular ejection fraction, by estimation, is 55 to 60%. The left ventricle has normal function. Left ventricular diastolic function could not be evaluated. Left ventricular diastolic function could not be evaluated due to atrial  fibrillation. Right Ventricle: There is moderately elevated pulmonary artery systolic pressure. The tricuspid regurgitant velocity is 2.77 m/s, and with an assumed right atrial pressure of 15 mmHg, the estimated right ventricular systolic pressure is 81.2 mmHg. Mitral Valve: The mitral valve is grossly normal. No evidence of mitral valve stenosis. Tricuspid Valve: The tricuspid valve is normal in structure. Tricuspid valve regurgitation is mild to moderate. Aortic Valve: The aortic valve is grossly normal. Aortic valve regurgitation is trivial. No aortic stenosis is present. Pulmonic Valve: The pulmonic valve was normal in structure. Pulmonic valve regurgitation is trivial. Aorta: The aortic root and ascending aorta are structurally normal, with no evidence of dilitation. LEFT VENTRICLE PLAX 2D LVIDd:  5.00 cm LVIDs:         3.80 cm LV PW:         1.00 cm LV IVS:        1.00 cm LVOT diam:     2.40 cm LVOT Area:     4.52 cm  LV Volumes (MOD) LV vol d, MOD A2C: 112.0 ml LV vol d, MOD A4C: 101.0 ml LV vol s, MOD A2C: 56.8 ml LV vol s, MOD A4C: 55.4 ml LV SV MOD A2C:     55.2 ml LV SV MOD A4C:     101.0 ml LV SV MOD BP:      49.6 ml RIGHT VENTRICLE TAPSE (M-mode): 1.5 cm LEFT ATRIUM         Index LA diam:    5.10 cm 2.32 cm/m   AORTA Ao Root diam: 3.80 cm Ao Asc diam:  3.60 cm TRICUSPID VALVE TR Peak grad:   30.7 mmHg TR Vmax:        277.00 cm/s  SHUNTS Systemic Diam: 2.40 cm Mertie Moores MD Electronically signed by Mertie Moores MD Signature Date/Time:  07/27/2020/3:32:59 PM    Final    CT CHEST ABDOMEN PELVIS WO CONTRAST  Result Date: 07/26/2020 CLINICAL DATA:  Sepsis and leukocytosis, recent fall, history of lung carcinoma EXAM: CT CHEST, ABDOMEN AND PELVIS WITHOUT CONTRAST TECHNIQUE: Multidetector CT imaging of the chest, abdomen and pelvis was performed following the standard protocol without IV contrast. COMPARISON:  Chest x-ray and pelvis film from earlier in the same day., 07/06/2020 FINDINGS: CT CHEST FINDINGS Cardiovascular: Atherosclerotic calcifications of the aorta are noted without aneurysmal dilatation. No cardiac enlargement is seen. No pericardial effusion is noted. 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 Mediastinum/Nodes: Thoracic inlet is within normal limits. No sizable hilar or mediastinal adenopathy is noted. The esophagus as visualized is within normal limits. Lungs/Pleura: Left lung is well aerated with mild emphysematous changes. No focal infiltrate or sizable effusion is seen. Previously seen bilobed right lower lobe mass lesion is again identified and stable. Small right-sided pleural effusion is noted stable from the prior exam. Musculoskeletal: Degenerative changes of the thoracic spine are noted. No acute rib abnormality is seen. CT ABDOMEN PELVIS FINDINGS Hepatobiliary: No focal liver abnormality is seen. No gallstones, gallbladder wall thickening, or biliary dilatation. Pancreas: Unremarkable. No pancreatic ductal dilatation or surrounding inflammatory changes. Spleen: Normal in size without focal abnormality. Adrenals/Urinary Tract: Adrenal glands again demonstrate calcification on the left consistent with prior adrenal hematoma. The overall appearance is stable. Right adrenal gland is within normal limits. Kidneys are well visualized bilaterally. Tiny nonobstructing left renal stones are noted. Ureters are within normal limits. Bladder is decompressed by Foley catheter. No obstructive changes are seen. Stomach/Bowel: Colon is  well visualized with some fluid within. Considerable wall thickening is noted within the ascending colon new from prior PET-CT from 02/24/2020. These changes are consistent with focal colitis without evidence of perforation. Some pericolonic inflammatory changes are seen. Small bowel and stomach appear within normal limits. The appendix is not well visualized. Vascular/Lymphatic: Aortic atherosclerosis. No enlarged abdominal or pelvic lymph nodes. Reproductive: Prostate is unremarkable. Other: No abdominal wall hernia or abnormality. No abdominopelvic ascites. Musculoskeletal: No acute or significant osseous findings. IMPRESSION: CT of the chest: Stable right lower lobe mass lesion with associated right effusion similar to that seen on the prior exam. CT of the abdomen and pelvis: Tiny nonobstructing left renal stone. Changes of ascending colitis without evidence of perforation or abscess formation. Stable  calcified left adrenal hematoma. Aortic Atherosclerosis (ICD10-I70.0) and Emphysema (ICD10-J43.9). Electronically Signed   By: Inez Catalina M.D.   On: 07/26/2020 23:08    Lab Data:  CBC: Recent Labs  Lab 08/04/20 0226 08/05/20 0301 08/06/20 0211 08/06/20 0751 08/06/20 1445 08/07/20 0139 08/07/20 0338  WBC 3.4* 5.2 5.7 6.2  --  4.5  --   HGB 8.1* 8.3* 7.2* 6.2* 7.1* 6.1* 6.1*  HCT 26.2* 27.4* 25.3* 21.1* 23.2* 19.9* 19.4*  MCV 90.3 92.3 94.8 92.1  --  90.0  --   PLT 179 218 225 239  --  198  --    Basic Metabolic Panel: Recent Labs  Lab 07/31/20 2133 08/01/20 0458 08/03/20 0209 08/04/20 0226 08/05/20 0301 08/05/20 0948 08/06/20 0211 08/07/20 0139  NA 138   < > 142 143 144  --  145 144  K 3.7   < > 3.7 3.9 4.0  --  4.1 3.5  CL 101   < > 103 103 102  --  103 106  CO2 26   < > 27 29 30   --  34* 31  GLUCOSE 120*   < > 100* 112* 104*  --  139* 265*  BUN 75*   < > 85* 86* 85*  --  88* 90*  CREATININE 8.11*   < > 8.57* 7.36* 5.78*  --  4.76* 4.34*  CALCIUM 7.1*   < > 7.7* 7.9* 8.0*   --  8.1* 7.6*  MG 1.9  --   --   --  1.3* 1.7  --   --   PHOS  --    < > 6.8* 6.6* 6.5*  --  6.1* 5.0*   < > = values in this interval not displayed.   GFR: Estimated Creatinine Clearance: 16.6 mL/min (A) (by C-G formula based on SCr of 4.34 mg/dL (H)). Liver Function Tests: Recent Labs  Lab 08/03/20 0209 08/04/20 0226 08/05/20 0301 08/06/20 0211 08/07/20 0139  ALBUMIN 2.6* 2.8* 3.0* 2.7* 2.0*   No results for input(s): LIPASE, AMYLASE in the last 168 hours. No results for input(s): AMMONIA in the last 168 hours. Coagulation Profile: No results for input(s): INR, PROTIME in the last 168 hours. Cardiac Enzymes: No results for input(s): CKTOTAL, CKMB, CKMBINDEX, TROPONINI in the last 168 hours. BNP (last 3 results) No results for input(s): PROBNP in the last 8760 hours. HbA1C: No results for input(s): HGBA1C in the last 72 hours. CBG: Recent Labs  Lab 08/06/20 1915 08/06/20 2301 08/07/20 0319 08/07/20 0736 08/07/20 1159  GLUCAP 93 124* 182* 84 111*   Lipid Profile: No results for input(s): CHOL, HDL, LDLCALC, TRIG, CHOLHDL, LDLDIRECT in the last 72 hours. Thyroid Function Tests: No results for input(s): TSH, T4TOTAL, FREET4, T3FREE, THYROIDAB in the last 72 hours. Anemia Panel: No results for input(s): VITAMINB12, FOLATE, FERRITIN, TIBC, IRON, RETICCTPCT in the last 72 hours. Urine analysis:    Component Value Date/Time   COLORURINE AMBER (A) 07/26/2020 2153   APPEARANCEUR CLOUDY (A) 07/26/2020 2153   LABSPEC 1.016 07/26/2020 2153   PHURINE 5.0 07/26/2020 2153   GLUCOSEU NEGATIVE 07/26/2020 2153   HGBUR LARGE (A) 07/26/2020 2153   BILIRUBINUR NEGATIVE 07/26/2020 2153   KETONESUR NEGATIVE 07/26/2020 2153   PROTEINUR 100 (A) 07/26/2020 2153   UROBILINOGEN 0.2 11/13/2013 1429   NITRITE NEGATIVE 07/26/2020 2153   LEUKOCYTESUR NEGATIVE 07/26/2020 2153     Bonnell Public M.D. [facility] 08/07/2020, 12:18 PM  Available via Epic secure chat  7am-7pm After  7 pm, please refer to night coverage provider listed on amion.

## 2020-08-07 NOTE — Plan of Care (Signed)
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
## 2020-08-07 NOTE — Progress Notes (Signed)
Palliative Medicine Inpatient Follow Up Note  Reason for consult:  Goals of Care  "80yo male with dementia, admitted with septic shock, acute respiratory failure, has been refusing Bipap, ac encephaloapthy, acute kidney injury, GI bleed, need goals of care"  HPI:  Per intake H&P --> 80 year old male with history of COPD, stage I non-small cell lung cancer s/p radiation in 04/2020, dementia, paroxysmal A. fib on Eliquis, GERD, leukemia s/p bone marrow transplantation in 1990 was admitted to ICU under PCCM service with altered mental status after a fall and diarrhea. He was found to be in shock/respiratory failure requiring BiPAP, acute kidney injury, rhabdomyolysis. CT of the head and C-spine were negative for acute abnormalities. CT of the abdomen and pelvis showedfocal colitis without evidence of perforation. Neurology and nephrology were consulted. He was treated with bicarb drip. EEG was negative for seizures. He was treated with broad-spectrum antibiotics initially; stool was positive for rotavirus and enteropathogenic E. Coli,received Zithromax. He was treated with intravenous fluids initially. Urine output has been minimal and he has been getting intermittent Lasix recently by nephrology. Mental status has improved; neurology signed off. Patient was [facility] to [facility] service on 07/31/2020.  Palliative care was asked to get involved in the setting of multiple co-morbidities with acute septic shock causing respiratory failure and AKI.   Today's Discussion (08/07/2020):  *Please note that this is a verbal dictation therefore any spelling or grammatical errors are due to the "[place] One" system interpretation.  I was able to check in with [facility] bedside RN, Todd Lloyd. She states that his mentation is improved and his BM's are not as frank red as was described prior.  I met with Todd Lloyd this afternoon. He was in good spirits. He was noted to be on RA. Prior to my entering  he had coughed on some of his CLD tray though it had cleared by the time I got to bedside. Todd Lloyd endorses impatience at waiting for everything to improve.I shared with him that he has endured a pretty significant GIB and it will take time to show improvements. He vocalized understanding. We reviewed the plan which would be to optimize his present health state and transition to skilled nursing thereafter.   I was able to call patients daughter, Todd Lloyd. We discussed that Todd Lloyd remains to have a GIB but it appears to be less than yesterday. Todd Lloyd shares that she had spoke to the GI physician who brought up that they may consider a colonoscopy on Monday.   Todd Lloyd goes on to tell me how upsetting it is that medicare does not offer supplemental services for patients like her father. She expresses frustration that she will not be able to care for him nor will she be able to afford a caregiver. I expressed understanding in regards to these concerns.   Palliative care will continue to provide ongoing support.  Objective Assessment: Vital Signs Vitals:   08/07/20 1204 08/07/20 1320  BP: (!) 134/47 (!) 146/67  Pulse: 79 90  Resp: 18 20  Temp: 98.5 F (36.9 C) 98.2 F (36.8 C)  SpO2: 100%     Intake/Output Summary (Last 24 hours) at 08/07/2020 1506 Last data filed at 08/07/2020 1320 Gross per 24 hour  Intake 1460 ml  Output 125 ml  Net 1335 ml   Last Weight  Most recent update: 08/07/2020  4:26 AM   Weight  99.5 kg (219 lb 4.8 oz)           Gen:  Ill appearing  geriatric M HEENT: moist mucous membranes CV: Regular rate and irregular rhythm  PULM: On RA ABD: soft/nontender  EXT: No edema  Neuro: Alert and oriented x3   SUMMARY OF RECOMMENDATIONS DNAR/DNI, patient would be open to a trial of dialysis only and would not want to be on long term HD  MOST Completed, paper copy placed onto the chart electric copy can be found in [place]  DNR Form Completed, paper copy placed onto  the chart electric copy can be found in [name]  Patients daughter will bring in a copy of advance directives for filing  Timed trial of treatments to see if improvements can be made  Todd Lloyd will discharge to a skilled nursing facility for additional rehabilitation  Ongoing incremental PMT support  Time Spent: 35 Greater than 50% of the time was spent in counseling and coordination of care ______________________________________________________________________________________ [place] Team Team Cell Phone: [phone] Please utilize secure chat with additional questions, if there is no response within 30 minutes please call the above phone number  Palliative Medicine Team providers are available by phone from 7am to 7pm daily and can be reached through the team cell phone.  Should this patient require assistance outside of these hours, please call the patient's attending physician.

## 2020-08-07 NOTE — Progress Notes (Signed)
3:18AM RN called due to patient's hemoglobin dropping to 6.1 from 7.1 (s/p transfusion of 2 units of PRBC).  No evidence of bleeding per RN.  H/H was repeated and was still 6.1.  2 units of blood ordered for transfusion.  We shall plan to give Lasix 20 Mg after first unit (with holding parameters).

## 2020-08-08 ENCOUNTER — Inpatient Hospital Stay (HOSPITAL_COMMUNITY): Payer: Medicare Other

## 2020-08-08 DIAGNOSIS — Z515 Encounter for palliative care: Secondary | ICD-10-CM | POA: Diagnosis not present

## 2020-08-08 DIAGNOSIS — M79662 Pain in left lower leg: Secondary | ICD-10-CM | POA: Diagnosis not present

## 2020-08-08 DIAGNOSIS — R933 Abnormal findings on diagnostic imaging of other parts of digestive tract: Secondary | ICD-10-CM

## 2020-08-08 DIAGNOSIS — J9602 Acute respiratory failure with hypercapnia: Secondary | ICD-10-CM | POA: Diagnosis not present

## 2020-08-08 DIAGNOSIS — K921 Melena: Secondary | ICD-10-CM | POA: Diagnosis not present

## 2020-08-08 DIAGNOSIS — M7989 Other specified soft tissue disorders: Secondary | ICD-10-CM

## 2020-08-08 DIAGNOSIS — Z66 Do not resuscitate: Secondary | ICD-10-CM | POA: Diagnosis not present

## 2020-08-08 DIAGNOSIS — Z7189 Other specified counseling: Secondary | ICD-10-CM | POA: Diagnosis not present

## 2020-08-08 LAB — TYPE AND SCREEN
ABO/RH(D): B POS
Antibody Screen: NEGATIVE
Unit division: 0
Unit division: 0
Unit division: 0
Unit division: 0

## 2020-08-08 LAB — CBC
HCT: 23 % — ABNORMAL LOW (ref 39.0–52.0)
Hemoglobin: 7.6 g/dL — ABNORMAL LOW (ref 13.0–17.0)
MCH: 28 pg (ref 26.0–34.0)
MCHC: 33 g/dL (ref 30.0–36.0)
MCV: 84.9 fL (ref 80.0–100.0)
Platelets: 211 10*3/uL (ref 150–400)
RBC: 2.71 MIL/uL — ABNORMAL LOW (ref 4.22–5.81)
RDW: 19.9 % — ABNORMAL HIGH (ref 11.5–15.5)
WBC: 5.5 10*3/uL (ref 4.0–10.5)
nRBC: 0 % (ref 0.0–0.2)

## 2020-08-08 LAB — BASIC METABOLIC PANEL
Anion gap: 9 (ref 5–15)
BUN: 78 mg/dL — ABNORMAL HIGH (ref 8–23)
CO2: 31 mmol/L (ref 22–32)
Calcium: 7.5 mg/dL — ABNORMAL LOW (ref 8.9–10.3)
Chloride: 101 mmol/L (ref 98–111)
Creatinine, Ser: 3.45 mg/dL — ABNORMAL HIGH (ref 0.61–1.24)
GFR, Estimated: 17 mL/min — ABNORMAL LOW (ref >60)
Glucose, Bld: 110 mg/dL — ABNORMAL HIGH (ref 70–99)
Potassium: 3.4 mmol/L — ABNORMAL LOW (ref 3.5–5.1)
Sodium: 141 mmol/L (ref 135–145)

## 2020-08-08 LAB — BPAM RBC
Blood Product Expiration Date: 202204032359
Blood Product Expiration Date: 202204132359
Blood Product Expiration Date: 202204132359
Blood Product Expiration Date: 202204132359
ISSUE DATE / TIME: 202203180929
ISSUE DATE / TIME: 202203181020
ISSUE DATE / TIME: 202203190638
ISSUE DATE / TIME: 202203191036
Unit Type and Rh: 7300
Unit Type and Rh: 7300
Unit Type and Rh: 7300
Unit Type and Rh: 7300

## 2020-08-08 LAB — GLUCOSE, CAPILLARY
Glucose-Capillary: 100 mg/dL — ABNORMAL HIGH (ref 70–99)
Glucose-Capillary: 100 mg/dL — ABNORMAL HIGH (ref 70–99)
Glucose-Capillary: 92 mg/dL (ref 70–99)
Glucose-Capillary: 97 mg/dL (ref 70–99)
Glucose-Capillary: 98 mg/dL (ref 70–99)
Glucose-Capillary: 99 mg/dL (ref 70–99)

## 2020-08-08 MED ORDER — POTASSIUM CHLORIDE CRYS ER 20 MEQ PO TBCR
40.0000 meq | EXTENDED_RELEASE_TABLET | Freq: Once | ORAL | Status: AC
  Administered 2020-08-08: 40 meq via ORAL
  Filled 2020-08-08: qty 2

## 2020-08-08 MED ORDER — PANTOPRAZOLE SODIUM 40 MG PO TBEC
40.0000 mg | DELAYED_RELEASE_TABLET | Freq: Every day | ORAL | Status: DC
  Administered 2020-08-09 – 2020-08-20 (×12): 40 mg via ORAL
  Filled 2020-08-08 (×12): qty 1

## 2020-08-08 MED ORDER — ACETAMINOPHEN 325 MG PO TABS
650.0000 mg | ORAL_TABLET | Freq: Three times a day (TID) | ORAL | Status: DC
  Administered 2020-08-08 – 2020-08-20 (×37): 650 mg via ORAL
  Filled 2020-08-08 (×37): qty 2

## 2020-08-08 NOTE — Progress Notes (Signed)
Physical Therapy Re-Evaluation Patient Details Name: Todd Lloyd MRN: [id number] DOB: 1941/01/01 Today's Date: 08/08/2020   History of Present Illness  80 yo admitted 3/7 after fall and remained on floor at home 6 hrs with rhabdomyolysis, acute respiratory failure, AMS, AKI. Hospital course complicated by increased CO2/Bipap, hypotension. PMhx; COPD, lung CA, dementia, Afib, leukemia.  Clinical Impression  Pt presents with condition above and deficits mentioned below, see PT Problem List. Pt was living with and caring for his wife prior to admission. He was independent in mobility, self care and driving. He reports enjoying being outdoors and home repairs. Pt presents with generalized weakness, decreased aerobic endurance and activity tolerance, and impaired standing balance. Pt's Sp02 decreased to as low as 81% when ambulating only ~20 ft with a RW and min guard-A in his room while on RA. It eventually returned to 96% when sitting and resting, but it took several minutes. He is eager to return home to his wife. Plan is for pt to go to [place] rehab prior to returning home. Will follow acutely.     Follow Up Recommendations SNF;Supervision/Assistance - 24 hour    Equipment Recommendations  Rolling walker with 5" wheels    Recommendations for Other Services       Precautions / Restrictions Precautions Precautions: Fall Precaution Comments: watch sats Restrictions Weight Bearing Restrictions: No      Mobility  Bed Mobility Overal bed mobility: Needs Assistance Bed Mobility: Supine to Sit     Supine to sit: Min guard;HOB elevated Sit to supine: Min guard   General bed mobility comments: +rail, increased time, min guard for safety    Transfers Overall transfer level: Needs assistance Equipment used: Rolling walker (2 wheeled) Transfers: Sit to/from Stand Sit to Stand: Min guard         General transfer comment: increased time, cues for hand placement, min guard for  safety  Ambulation/Gait Ambulation/Gait assistance: Min assist;Min guard Gait Distance (Feet): 20 Feet (x2 bouts of ~8 ft > ~20 ft) Assistive device: Rolling walker (2 wheeled) Gait Pattern/deviations: Step-through pattern;Decreased stride length;Trunk flexed Gait velocity: decreased Gait velocity interpretation: <1.8 ft/sec, indicate of risk for recurrent falls General Gait Details: Pt ambulates with decreased bil step length slowly with trunk flexed. SpO2 decreased to as low as 81% towards last half of longer gait bout, thus returned to sit to rest with it eventually returning to 96% on RA after several minutes.  Stairs            Wheelchair Mobility    Modified Rankin (Stroke Patients Only)       Balance Overall balance assessment: Needs assistance;History of Falls   Sitting balance-Leahy Scale: Good     Standing balance support: Bilateral upper extremity supported;During functional activity Standing balance-Leahy Scale: Poor Standing balance comment: reliant on RW                             Pertinent Vitals/Pain Pain Assessment: Faces Faces Pain Scale: Hurts little more Pain Location: medial abdomen/chest then lower back and R scapular region after longer gait bout with increased labored breathing Pain Descriptors / Indicators: Tightness Pain Intervention(s): Limited activity within patient's tolerance;Monitored during session;Repositioned (RN made aware of the pain and that it was improving some with rest and with changes in position)    Home Living Family/patient expects to be discharged to:: Private residence Living Arrangements: Spouse/significant other (spouse with h/o stroke, pt was her caregiver)  Available Help at Discharge: Family;Available PRN/intermittently Type of Home: House Home Access: Stairs to enter   [employer] of Steps: 2 Home Layout: One level Home Equipment: Walker - 2 wheels      Prior Function Level of  Independence: Independent         Comments: Reports driving prior to admission, does not walk with a device, occasionally sat to shower     Hand Dominance   Dominant Hand: Right    Extremity/Trunk Assessment   Upper Extremity Assessment Upper Extremity Assessment: Defer to OT evaluation    Lower Extremity Assessment Lower Extremity Assessment: Generalized weakness    Cervical / Trunk Assessment Cervical / Trunk Assessment: Kyphotic  Communication   Communication: No difficulties  Cognition Arousal/Alertness: Awake/alert Behavior During Therapy: WFL for tasks assessed/performed Overall Cognitive Status: No family/caregiver present to determine baseline cognitive functioning Area of Impairment: Following commands;Safety/judgement;Problem solving                       Following Commands: Follows multi-step commands inconsistently;Follows one step commands consistently;Follows one step commands with increased time;Follows multi-step commands with increased time Safety/Judgement: Decreased awareness of safety;Decreased awareness of deficits   Problem Solving: Slow processing;Requires verbal cues;Difficulty sequencing General Comments: Pt slow to process cues, needing repeated cues at times to sequence turning or stepping back.      General Comments General comments (skin integrity, edema, etc.): SpO2 as low as 81% with longer gait bout, returned to 96% after >4 min sitting and resting while on RA, RN made aware    Exercises General Exercises - Lower Extremity Hip Flexion/Marching: Both;15 reps;Standing (RW) Mini-Sqauts: Both;10 reps;Standing (RW)   Assessment/Plan    PT Assessment Patient needs continued PT services  PT Problem List Decreased strength;Decreased mobility;Decreased safety awareness;Decreased activity tolerance;Decreased cognition;Decreased balance;Decreased knowledge of use of DME;Decreased coordination;Cardiopulmonary status limiting activity        PT Treatment Interventions DME instruction;Therapeutic exercise;Gait training;Balance training;Functional mobility training;Therapeutic activities;Patient/family education;Cognitive remediation;Neuromuscular re-education;Stair training    PT Goals (Current goals can be found in the Care Plan section)  Acute Rehab PT Goals Patient Stated Goal: to improve PT Goal Formulation: With patient Time For Goal Achievement: 08/22/20 Potential to Achieve Goals: Fair    Frequency Min 2X/week   Barriers to discharge Decreased caregiver support      Co-evaluation               AM-PAC PT "6 Clicks" Mobility  Outcome Measure Help needed turning from your back to your side while in a flat bed without using bedrails?: A Little Help needed moving from lying on your back to sitting on the side of a flat bed without using bedrails?: A Little Help needed moving to and from a bed to a chair (including a wheelchair)?: A Little Help needed standing up from a chair using your arms (e.g., wheelchair or bedside chair)?: A Little Help needed to walk in hospital room?: A Little Help needed climbing 3-5 steps with a railing? : A Lot 6 Click Score: 17    End of Session Equipment Utilized During Treatment: Gait belt Activity Tolerance: Treatment limited secondary to medical complications (Comment) (limited by decreasing SpO2) Patient left: in chair;with call bell/phone within reach;with chair alarm set;with nursing/sitter in room Nurse Communication: Mobility status;Other (comment) (abdominal/chest/lower back/ scapular tightness pain following gait that improved slightly with rest and changing positions; diarrhea) PT Visit Diagnosis: Other abnormalities of gait and mobility (R26.89);Difficulty in walking, not elsewhere classified (R26.2);Muscle  weakness (generalized) (M62.81);Unsteadiness on feet (R26.81)    Time: 4114-6431 PT Time Calculation (min) (ACUTE ONLY): 32 min   Charges:   PT  Evaluation $PT Re-evaluation: 1 Re-eval PT Treatments $Gait Training: 8-22 mins        Moishe Spice, PT, DPT Acute Rehabilitation Services  Pager: [phone] Office: [place] 08/08/2020, 5:23 PM

## 2020-08-08 NOTE — Progress Notes (Signed)
Palliative Medicine Inpatient Follow Up Note  Reason for consult:  Goals of Care  "80yo male with dementia, admitted with septic shock, acute respiratory failure, has been refusing Bipap, ac encephaloapthy, acute kidney injury, GI bleed, need goals of care"  HPI:  Per intake H&P --> 80 year old male with history of COPD, stage I non-small cell lung cancer s/p radiation in 04/2020, dementia, paroxysmal A. fib on Eliquis, GERD, leukemia s/p bone marrow transplantation in 1990 was admitted to ICU under PCCM service with altered mental status after a fall and diarrhea. He was found to be in shock/respiratory failure requiring BiPAP, acute kidney injury, rhabdomyolysis. CT of the head and C-spine were negative for acute abnormalities. CT of the abdomen and pelvis showedfocal colitis without evidence of perforation. Neurology and nephrology were consulted. He was treated with bicarb drip. EEG was negative for seizures. He was treated with broad-spectrum antibiotics initially; stool was positive for rotavirus and enteropathogenic E. Coli,received Zithromax. He was treated with intravenous fluids initially. Urine output has been minimal and he has been getting intermittent Lasix recently by nephrology. Mental status has improved; neurology signed off. Patient was transferred to [facility] service on 07/31/2020.  Palliative care was asked to get involved in the setting of multiple co-morbidities with acute septic shock causing respiratory failure and AKI.   Today's Discussion (08/08/2020):  *Please note that this is a verbal dictation therefore any spelling or grammatical errors are due to the "[place]" system interpretation.  Per review of labs Cr continues to improve and Hgb this morning appears more stable.   I met with Todd Lloyd at bedside this morning. The GI team was also present. They reviewed with Todd Lloyd that at this time they will continue with supportive measures. Ideally they  would like to veer away from a scope as he is identified as high risk for this procedure. Encouraged to eat a bland diet.   Per conversation with patients evening RN, Todd Lloyd. Todd Lloyd had no bloody BM's over night. He has remained alert and oriented. She shares that she is not certain as to why he has a foley. Expresses that he had some generalized pain in his lower back. We discussed starting ATC tylenol for this.   Todd Lloyd was able to stand and walk to his chair this morning with 1P assistance.   Palliative care will continue to provide ongoing support.  Objective Assessment: Vital Signs Vitals:   08/08/20 0922 08/08/20 0934  BP: (!) 140/58   Pulse:    Resp: 19   Temp: 98.7 F (37.1 C)   SpO2: 100% 100%    Intake/Output Summary (Last 24 hours) at 08/08/2020 1059 Last data filed at 08/08/2020 0402 Gross per 24 hour  Intake 310 ml  Output 1060 ml  Net -750 ml   Last Weight  Most recent update: 08/07/2020  4:26 AM   Weight  99.5 kg (219 lb 4.8 oz)           Gen:  Ill appearing geriatric M HEENT: moist mucous membranes CV: Regular rate and irregular rhythm  PULM: On RA ABD: soft/nontender  EXT: No edema  Neuro: Alert and oriented x3   SUMMARY OF RECOMMENDATIONS DNAR/DNI, patient would be open to a trial of dialysis only and would not want to be on long term HD  MOST Completed, paper copy placed onto the chart electric copy can be found in [place]  DNR Form Completed, paper copy placed onto the chart electric copy can be found in [place]  Patients daughter will bring in a copy of advance directives for filing  Timed trial of treatments to see if improvements can be made  Nutrition support appreciated  Added tylenol ATC for generalized pain  Todd Lloyd will discharge to a skilled nursing facility for additional rehabilitation  Ongoing incremental PMT support  Time Spent: 35 Greater than 50% of the time was spent in counseling and coordination of  care ______________________________________________________________________________________ [place] Team Team Cell Phone: [phone] Please utilize secure chat with additional questions, if there is no response within 30 minutes please call the above phone number  Palliative Medicine Team providers are available by phone from 7am to 7pm daily and can be reached through the team cell phone.  Should this patient require assistance outside of these hours, please call the patient's attending physician.

## 2020-08-08 NOTE — Progress Notes (Signed)
Daily Rounding Note  08/08/2020, 10:37 AM  LOS: 13 days   SUBJECTIVE:   Chief complaint: Hematochezia.  Right-sided colitis    Patient is feeling well.  No nausea or vomiting.  Tolerating clear liquids.  Bowel movement this morning brown, no blood.  No abdominal pain.  Walking with the walker in the room and not dizzy.  No shortness of breath.  OBJECTIVE:         Vital signs in last 24 hours:    Temp:  [97.8 F (36.6 C)-98.7 F (37.1 C)] 98.7 F (37.1 C) (03/20 0922) Pulse Rate:  [78-101] 82 (03/20 0400) Resp:  [12-22] 19 (03/20 0922) BP: (120-163)/(46-68) 140/58 (03/20 0922) SpO2:  [93 %-100 %] 100 % (03/20 0934) FiO2 (%):  [32 %] 32 % (03/20 0934) Last BM Date: 08/06/20 Filed Weights   08/05/20 0531 08/06/20 0516 08/07/20 0426  Weight: 102.3 kg 99 kg 99.5 kg   General: Looks chronically unwell but comfortable and not acutely ill. Heart: irreg, irreg.  Rate 90. Chest: Clear bilaterally with overall reduced breath sounds. Abdomen: Soft, obese, not tender, not distended.  Active bowel sounds Extremities: Left greater than right pedal edema. Neuro/Psych: Alert.  Appropriate.  Follows all commands.  Fluid speech.  Moves all 4 limbs without tremor.  Intake/Output from previous day: 03/19 0701 - 03/20 0700 In: 620 [Blood:620] Out: 1060 [Urine:1060]  Intake/Output this shift: No intake/output data recorded.  Lab Results: Recent Labs    08/06/20 0751 08/06/20 1445 08/07/20 0139 08/07/20 0338 08/07/20 2149 08/08/20 0146  WBC 6.2  --  4.5  --   --  5.5  HGB 6.2*   < > 6.1* 6.1* 7.7* 7.6*  HCT 21.1*   < > 19.9* 19.4* 24.0* 23.0*  PLT 239  --  198  --   --  211   < > = values in this interval not displayed.   BMET Recent Labs    08/06/20 0211 08/07/20 0139 08/08/20 0146  NA 145 144 141  K 4.1 3.5 3.4*  CL 103 106 101  CO2 34* 31 31  GLUCOSE 139* 265* 110*  BUN 88* 90* 78*  CREATININE 4.76* 4.34*  3.45*  CALCIUM 8.1* 7.6* 7.5*   LFT Recent Labs    08/06/20 0211 08/07/20 0139  ALBUMIN 2.7* 2.0*   PT/INR No results for input(s): LABPROT, INR in the last 72 hours. Hepatitis Panel No results for input(s): HEPBSAG, HCVAB, HEPAIGM, HEPBIGM in the last 72 hours.  Studies/Results: No results found.  Scheduled Meds: . budesonide (PULMICORT) nebulizer solution  0.5 mg Nebulization BID  . chlorhexidine  15 mL Mouth Rinse BID  . Chlorhexidine Gluconate Cloth  6 each Topical Q0600  . donepezil  5 mg Oral QHS  . mouth rinse  15 mL Mouth Rinse q12n4p  . memantine  5 mg Oral BID  . [START ON 08/09/2020] pantoprazole  40 mg Intravenous Q12H   Continuous Infusions: . sodium chloride 10 mL/hr at 07/30/20 1800  . lactated ringers 10 mL/hr at 07/30/20 1800  . pantoprozole (PROTONIX) infusion 8 mg/hr (08/08/20 1000)   PRN Meds:.acetaminophen, albuterol, Gerhardt's butt cream, loperamide, polyethylene glycol  ASSESMENT:   *   Painless hematochezia.  Initial CT of 3/7 w ascending colitis, Ischemic vs infectious colitis.  07/27/2020 stool PCR panel positive for rotavirus and enteropathogenic E. coli has completed all antibiotics which included ampicillin, azithromycin, cefepime, ceftriaxone, vancomycin, metronidazole at various points during his 12-day hospitalization.  Overall improving.  The bloody stool has resolved.  *    acute blood loss anemia.  Hgb 6.1 >> 7.6.  S/p PRBC x 4.  Baseline Hgb 10 to 11.    *    chronic Eliquis for A. fib.  On hold.  Received Kcentra on 3/18. Hospitalist initiated empiric Protonix drip this morning.  *    AKI in setting of rhabdomyolysis, septic shock and baseline CKD.  GFR 17   PLAN   *   ?  When, if to restart Eliquis?  *   CBC in AM.    *   This is a lower GI bleed so I stopped the Protonix drip and switched him back to oral Protonix. Resume carb mod diet.    Todd Lloyd  08/08/2020, 10:37 AM Phone [phone]

## 2020-08-08 NOTE — Progress Notes (Signed)
Hospitalist notified pt had 6 beat run of accelerated rhythm. Pt asleep on bipap at the time but denies symptoms when awakened.

## 2020-08-08 NOTE — Evaluation (Signed)
Occupational Therapy Evaluation Patient Details Name: Todd Lloyd MRN: [id number] DOB: 15-Aug-1940 Today's Date: 08/08/2020    History of Present Illness 80 yo admitted 3/7 after fall and remained on floor at home 6 hrs with rhabdomyolysis, acute respiratory failure, AMS, AKI. Hospital course complicated by increased CO2/Bipap, hypotension. PMhx; COPD, lung CA, dementia, Afib, leukemia.   Clinical Impression   Pt was living with and caring for his wife prior to admission. He was independent in mobility, self care and driving. He reports enjoying being outdoors and home repairs. Pt presents with generalized weakness and impaired standing balance. Pt's Sp02 in mid 90s on RA. Pt requires min guard assist and RW for mobility and set up to max assist for ADL this visit. Pt voicing frustration with his slow progress and having diarrhea after eating a regular diet today. He is eager to return home to his wife. Plan is for pt to go to [facility] prior to returning home. Will follow acutely.     Follow Up Recommendations  SNF;Supervision/Assistance - 24 hour    Equipment Recommendations  3 in 1 bedside commode    Recommendations for Other Services       Precautions / Restrictions Precautions Precautions: Fall      Mobility Bed Mobility Overal bed mobility: Needs Assistance Bed Mobility: Supine to Sit;Sit to Supine     Supine to sit: Min guard;HOB elevated Sit to supine: Min guard   General bed mobility comments: +rail, increased time, min guard for safety    Transfers Overall transfer level: Needs assistance Equipment used: Rolling walker (2 wheeled) Transfers: Sit to/from Stand Sit to Stand: Min guard         General transfer comment: increased time, cues for hand placement, elevated bed    Balance Overall balance assessment: Needs assistance;History of Falls   Sitting balance-Leahy Scale: Good     Standing balance support: Bilateral upper extremity supported;During  functional activity Standing balance-Leahy Scale: Poor Standing balance comment: reliant on RW                           ADL either performed or assessed with clinical judgement   ADL Overall ADL's : Needs assistance/impaired Eating/Feeding: Set up;Sitting   Grooming: Wash/dry hands;Wash/dry face;Set up;Sitting   Upper Body Bathing: Minimal assistance;Sitting   Lower Body Bathing: Maximal assistance;Sit to/from stand   Upper Body Dressing : Sitting;Set up   Lower Body Dressing: Maximal assistance;Sit to/from stand   Toilet Transfer: Ambulation;RW;BSC;Min guard   Toileting- Water quality scientist and Hygiene: Sit to/from stand;Minimal assistance       Functional mobility during ADLs: Rolling walker;Min guard       Vision Baseline Vision/History: Wears glasses Wears Glasses: At all times Patient Visual Report: No change from baseline       Perception     Praxis      Pertinent Vitals/Pain Pain Assessment: No/denies pain     Hand Dominance Right   Extremity/Trunk Assessment Upper Extremity Assessment Upper Extremity Assessment: Overall WFL for tasks assessed   Lower Extremity Assessment Lower Extremity Assessment: Defer to PT evaluation   Cervical / Trunk Assessment Cervical / Trunk Assessment: Kyphotic   Communication Communication Communication: No difficulties   Cognition Arousal/Alertness: Awake/alert Behavior During Therapy: WFL for tasks assessed/performed Overall Cognitive Status: No family/caregiver present to determine baseline cognitive functioning Area of Impairment: Memory  General Comments       Exercises     Shoulder Instructions      Home Living Family/patient expects to be discharged to:: Private residence Living Arrangements: Spouse/significant other (spouse with h/o stroke, pt was her caregiver) Available Help at Discharge: Family;Available PRN/intermittently Type of  Home: House Home Access: Stairs to enter [employer] of Steps: 2   Home Layout: One level     Bathroom Shower/Tub: Walk-in shower;Tub/shower unit   Bathroom Toilet: Standard     Home Equipment: Walker - 2 wheels          Prior Functioning/Environment Level of Independence: Independent        Comments: Reports driving prior to admission, does not walk with a device, occasionally sat to shower        OT Problem List: Decreased activity tolerance;Decreased strength;Impaired balance (sitting and/or standing);Decreased knowledge of use of DME or AE;Cardiopulmonary status limiting activity;Decreased cognition      OT Treatment/Interventions: Self-care/ADL training;DME and/or AE instruction;Therapeutic activities;Patient/family education;Balance training    OT Goals(Current goals can be found in the care plan section) Acute Rehab OT Goals Patient Stated Goal: return home to care for my wife OT Goal Formulation: With patient Time For Goal Achievement: 08/22/20 Potential to Achieve Goals: Good ADL Goals Pt Will Perform Grooming: with supervision;standing (3 activities) Pt Will Perform Lower Body Bathing: with supervision;sit to/from stand;with adaptive equipment Pt Will Perform Lower Body Dressing: with supervision;with adaptive equipment;sit to/from stand Pt Will Transfer to Toilet: with supervision;ambulating Pt Will Perform Toileting - Clothing Manipulation and hygiene: with supervision;sit to/from stand  OT Frequency: Min 2X/week   Barriers to D/C:            Co-evaluation              AM-PAC OT "6 Clicks" Daily Activity     Outcome Measure Help from another person eating meals?: None Help from another person taking care of personal grooming?: A Little Help from another person toileting, which includes using toliet, bedpan, or urinal?: A Lot Help from another person bathing (including washing, rinsing, drying)?: A Lot Help from another person to  put on and taking off regular upper body clothing?: A Little   6 Click Score: 14   End of Session Equipment Utilized During Treatment: Gait belt;Rolling walker  Activity Tolerance: Patient tolerated treatment well Patient left: in bed;with call bell/phone within reach;with bed alarm set  OT Visit Diagnosis: Unsteadiness on feet (R26.81);Other abnormalities of gait and mobility (R26.89);History of falling (Z91.81);Muscle weakness (generalized) (M62.81);Other symptoms and signs involving cognitive function                Time: 1414-1433 OT Time Calculation (min): 19 min Charges:  OT General Charges $OT Visit: 1 Visit OT Evaluation $OT Eval Moderate Complexity: 1 Mod  Nestor Lewandowsky, OTR/L Acute Rehabilitation Services Pager: [phone] Office: [phone]  Malka So 08/08/2020, 2:47 PM

## 2020-08-08 NOTE — Progress Notes (Signed)
VASCULAR LAB    Left lower extremity venous duplex has been performed.  See CV proc for preliminary results.   Lateria Alderman, RVT 08/08/2020, 12:26 PM

## 2020-08-08 NOTE — Progress Notes (Signed)
[facility]                                                                              Patient Demographics  Todd Lloyd, is a 80 y.o. male, DOB - 04-26-1941, GBT:[id number]  Admit date - 07/26/2020   Admitting Physician Renee Pain, MD  Outpatient Primary MD for the patient is [name], [name], MD  Outpatient specialists:   LOS - 13  days   Medical records reviewed and are as summarized below:    Chief Complaint  Patient presents with  . Fall  . Altered Mental Status       Brief summary   80 year old male with history of COPD, stage I non-small cell lung cancer s/p radiation in 04/2020, dementia, paroxysmal A. fib on Eliquis, GERD, leukemia s/p bone marrow transplantation in 1990 was admitted to ICU under PCCM service with altered mental status after a fall and diarrhea.  He was found to be in shock/respiratory failure requiring BiPAP, acute kidney injury, rhabdomyolysis.  CT of the head and C-spine were negative for acute abnormalities.  CT of the abdomen and pelvis showed focal colitis without evidence of perforation.  Neurology and nephrology were consulted.  He was treated with bicarb drip.  EEG was negative for seizures.  He was treated with broad-spectrum antibiotics initially; stool was positive for rotavirus and enteropathogenic E. Coli, received Zithromax.  He was treated with intravenous fluids initially.  Urine output has been minimal and he has been getting intermittent Lasix recently by nephrology.  Mental status has improved; neurology signed off.  Patient was transferred to [facility] service on 07/31/2020.  08/07/2020: Patient seen.  Discussed with patient and spouse.  Patient is currently being transfused packed red blood cells.  No further rectal bleeding reported.  Last hemoglobin prior to blood transfusion was 6.1 g/dL.  We will continue to monitor H&H.  08/08/2020: Patient seen.  No further bleeding reported.  GI input is appreciated.  We will  continue to hold anticoagulation.  Hemoglobin is stable at 7.6 g/dL.  For skilled nursing facility placement once cleared for discharge.  Diet as tolerated.   Assessment & Plan   Acute on chronic blood loss anemia, ongoing GI bleed, hypotension  -Overnight patient started having clots with large BM, eliquis was placed on hold -Hemoglobin down to 6.2 this morning, ordered 2 units packed RBCs transfusion -Patient has had several BMs with thick dark clots since then. -BP now in 80s, placed on IV fluid bolus, infusing 1/2 packed RBC transfusion -Placed on IV PPI drip, consulted with enterology. -Unable to get CTA abdomen due to renal dysfunction, creatinine 4.76.  Currently not stable for tagged RBC scan with hypotension. -Will give Kcentra for Eliquis reversal, last Eliquis dose at 2245 last night - will follow GI recommendations regarding endoscopy.  CCM consulted for hemorrhagic shock, BP in 80s 08/07/2020: No further bleeding reported.  Currently being transfused packed red blood cells.  We will continue to monitor H&H. 08/08/2020: H&H is stable.  No further bleeding reported.  Acute kidney injury: Likely secondary from ATN, septic shock and rhabdomyolysis -Initially treated with IV fluids, subsequently with intermittent  Lasix.  Nonoliguric -Nephrology following, creatinine appears to be trending down -Creatinine improving, 4.7 today, hopefully renal recovery -CK has trended down. -Renal ultrasound negative for obstruction, continue to hold losartan, avoid nephrotoxins 08/07/2020: AKI is multifactorial.  Full recovery, if possible, will take a long time.  08/08/2020: Slight improvement in renal function is noted.  Continue to monitor closely.  Acute respiratory failure with hypercarbia and hypoxia, acute COPD exacerbation -On 3/17, patient found to be in hypercarbic respiratory failure, lethargic not responding to verbal commands.  ABG showed pH of 7.2, PCO2 70.1.  Overnight patient had  refused BiPAP -Placed on BiPAP, alert and oriented this morning - 2D echo showed EF of 55 to 60%.  - Patient needs to have BiPAP QHS and PRN, however had been refusing.  Acute metabolic encephalopathy superimposed on dementia, myoclonic jerks  -Likely due to hypercarbic respiratory failure, now improved, alert and oriented this morning.  Patient follows neurology outpatient, Dr. [name].  --CT head negative for acute intracranial pathology -Melatonin discontinued avoid sedating meds, BiPAP nightly -Patient had EEG during admission, negative for seizures. MRI was initially deferred due to rapid improvement in his mental status. -Continue Namenda, Aricept, fall precautions  Septic shock, present on admission.  Rotavirus and enteropathogenic E. coli diarrhea -Initially treated with aggressive IV fluid hydration, broad-spectrum antibiotics -Also received empiric antibiotics for meningitis due to altered mental status, DC'd on 07/27/2020. -Blood cultures, urine cultures negative, Covid/flu negative, C. difficile negative -GI PCR was positive for rotavirus/enteropathogenic E. coli, received a dose of Zithromax on 07/28/2020 -Antibiotics have been subsequently discontinued.     History of stage I right lung cancer status post XRT -Outpatient follow-up with oncology  Paroxysmal atrial fibrillation -Rate controlled, digoxin on hold -Hold Eliquis  Leukocytosis -Resolved  Thrombocytopenia  GERD -Continue PPI  Obesity Estimated body mass index is 29.74 kg/m as calculated from the following:   Height as of this encounter: 6' (1.829 m).   Weight as of this encounter: 99.5 kg.  Code Status: DNR DVT Prophylaxis:  SCDs Start: 07/26/20 2041   Level of Care: Level of care: Progressive Family Communication: Discussed with patient   Disposition Plan:     Status is: Inpatient  Remains inpatient appropriate because:Inpatient level of care appropriate due to severity of illness   Dispo:  The patient is from: Home              Anticipated d/c is to: SNF              Patient currently is not medically stable to d/c.  Acute ongoing GI bleed   Difficult to place patient No      Time Spent in minutes 25 minutes  Procedures:  2D echo  Consultants:   Nephrology CCM  Antimicrobials:   Anti-infectives (From admission, onward)   Start     Dose/Rate Route Frequency Ordered Stop   07/28/20 0845  azithromycin (ZITHROMAX) tablet 1,000 mg        1,000 mg Oral  Once 07/28/20 0747 07/28/20 0827   07/28/20 0608  cefTRIAXone (ROCEPHIN) 2 g in sodium chloride 0.9 % 100 mL IVPB  Status:  Discontinued        2 g 200 mL/hr over 30 Minutes Intravenous Every 24 hours 07/27/20 1447 07/28/20 0747   07/27/20 1045  metroNIDAZOLE (FLAGYL) IVPB 500 mg        500 mg 100 mL/hr over 60 Minutes Intravenous  Once 07/27/20 0956 07/27/20 1113   07/26/20 1930  acyclovir (ZOVIRAX)  775 mg in dextrose 5 % 150 mL IVPB  Status:  Discontinued        10 mg/kg  77.6 kg (Ideal) 165.5 mL/hr over 60 Minutes Intravenous Every 24 hours 07/26/20 1848 07/27/20 1424   07/26/20 1915  ampicillin (OMNIPEN) 2 g in sodium chloride 0.9 % 100 mL IVPB  Status:  Discontinued        2 g 300 mL/hr over 20 Minutes Intravenous Every 8 hours 07/26/20 1848 07/27/20 1424   07/26/20 1900  cefTRIAXone (ROCEPHIN) 2 g in sodium chloride 0.9 % 100 mL IVPB  Status:  Discontinued        2 g 200 mL/hr over 30 Minutes Intravenous Every 12 hours 07/26/20 1846 07/27/20 1447   07/26/20 1851  vancomycin variable dose per unstable renal function (pharmacist dosing)  Status:  Discontinued         Does not apply See admin instructions 07/26/20 1853 07/27/20 1424   07/26/20 1815  ceFEPIme (MAXIPIME) 2 g in sodium chloride 0.9 % 100 mL IVPB  Status:  Discontinued        2 g 200 mL/hr over 30 Minutes Intravenous  Once 07/26/20 1802 07/26/20 1846   07/26/20 1815  metroNIDAZOLE (FLAGYL) IVPB 500 mg        500 mg 100 mL/hr over 60 Minutes  Intravenous  Once 07/26/20 1802 07/26/20 2049   07/26/20 1815  vancomycin (VANCOREADY) IVPB 1750 mg/350 mL        1,750 mg 175 mL/hr over 120 Minutes Intravenous  Once 07/26/20 1802 07/26/20 2228         Medications  Scheduled Meds: . acetaminophen  650 mg Oral TID  . budesonide (PULMICORT) nebulizer solution  0.5 mg Nebulization BID  . chlorhexidine  15 mL Mouth Rinse BID  . Chlorhexidine Gluconate Cloth  6 each Topical Q0600  . donepezil  5 mg Oral QHS  . mouth rinse  15 mL Mouth Rinse q12n4p  . memantine  5 mg Oral BID  . [START ON 08/09/2020] pantoprazole  40 mg Oral Daily   Continuous Infusions: . sodium chloride 10 mL/hr at 07/30/20 1800  . lactated ringers 10 mL/hr at 07/30/20 1800   PRN Meds:.albuterol, Gerhardt's butt cream, loperamide, polyethylene glycol      Subjective:  No further bleeding reported.  Objective:   Vitals:   08/08/20 0922 08/08/20 0934 08/08/20 1212 08/08/20 1521  BP: (!) 140/58  (!) 169/80 (!) 143/65  Pulse:   87 93  Resp: 19  20 (!) 21  Temp: 98.7 F (37.1 C)  98.5 F (36.9 C) 98.7 F (37.1 C)  TempSrc: Oral  Oral Oral  SpO2: 100% 100% 96% 96%  Weight:      Height:        Intake/Output Summary (Last 24 hours) at 08/08/2020 1540 Last data filed at 08/08/2020 0402 Gross per 24 hour  Intake --  Output 1060 ml  Net -1060 ml     Wt Readings from Last 3 Encounters:  08/07/20 99.5 kg  04/22/20 93.6 kg  04/19/20 93.9 kg   Physical Exam  General: Patient is awake and alert.  Patient is at the daily distress.  Patient is pale.   Cardiovascular: S1 S2   Respiratory: Decreased breath sound at the bases  Gastrointestinal: Soft, nontender, nondistended, NBS  Ext: Edema of the extremities.    Neuro: Patient is awake and alert.  Patient moves all extremities.   Data Reviewed:  I have personally reviewed following labs and imaging  studies  Micro Results No results found for this or any previous visit (from the past 240  hour(s)).  Radiology Reports CT HEAD WO CONTRAST  Result Date: 08/05/2020 CLINICAL DATA:  Expressive aphasia with stroke suspected EXAM: CT HEAD WITHOUT CONTRAST TECHNIQUE: Contiguous axial images were obtained from the base of the skull through the vertex without intravenous contrast. COMPARISON:  Ten days ago FINDINGS: Brain: No evidence of acute infarction, hemorrhage, hydrocephalus, extra-axial collection or mass lesion/mass effect. Generalized atrophy. Age normal appearance of white matter. Vascular: No hyperdense vessel or unexpected calcification. Skull: Normal. Negative for fracture or focal lesion. Sinuses/Orbits: No acute finding. Bilateral scleral banding and cataract resection. IMPRESSION: Stable head CT. No visible infarct or other change from recent comparison. Electronically Signed   By: Monte Fantasia M.D.   On: 08/05/2020 05:01   CT HEAD WO CONTRAST  Result Date: 07/26/2020 CLINICAL DATA:  Trauma. Unwitnessed fall. EXAM: CT HEAD WITHOUT CONTRAST CT CERVICAL SPINE WITHOUT CONTRAST TECHNIQUE: Multidetector CT imaging of the head and cervical spine was performed following the standard protocol without intravenous contrast. Multiplanar CT image reconstructions of the cervical spine were also generated. COMPARISON:  CT cervical spine August 30, 2011. FINDINGS: CT HEAD FINDINGS Brain: No evidence of acute large vascular territory infarction, hemorrhage, hydrocephalus, extra-axial collection or mass lesion/mass effect. Mild for age scattered white matter hypodensities, most likely related to chronic microvascular ischemic disease. Mild generalized cerebral atrophy with ex vacuo ventricular dilation. Vascular: Calcific atherosclerosis. No hyperdense vessel identified. Skull: No acute fracture.  Left frontal scalp contusion Sinuses/Orbits: Visualized sinuses are clear. Bilateral scleral buckles. Other: No mastoid effusions. CT CERVICAL SPINE FINDINGS Alignment: Mild anterolisthesis of C4 on C5, favor  degenerative given facet arthropathy at this level. Broad dextrocurvature of the cervical spine. Skull base and vertebrae: Vertebral body heights are maintained. No evidence of acute fracture. Soft tissues and spinal canal: No prevertebral fluid or swelling. No visible canal hematoma. Disc levels: Left eccentric degenerative disc disease in the lower cervical spine, greatest at C7-T1 with disc height loss and endplate sclerosis. Bulky facet hypertrophy at multiple levels, greatest on the left at C4-C5 where there is likely at least moderate bony foraminal stenosis. Upper chest: Emphysema. Lung apices appear clear although evaluation is limited by motion. IMPRESSION: CT head: 1. No evidence of acute intracranial abnormality. 2. Left frontal scalp contusion without acute fracture. CT cervical spine: 1. No evidence of acute fracture. 2. Rotation of C1 on C2 is likely positional in the absence of a fixed torticollis. 3. Bulky facet hypertrophy at multiple levels, greatest on the left at C4-C5 where there is likely at least moderate bony foraminal stenosis. 4. Left eccentric degenerative disc disease in the lower cervical spine, greatest at C7-T1. Electronically Signed   By: Margaretha Sheffield MD   On: 07/26/2020 17:59   CT CERVICAL SPINE WO CONTRAST  Result Date: 07/26/2020 CLINICAL DATA:  Trauma. Unwitnessed fall. EXAM: CT HEAD WITHOUT CONTRAST CT CERVICAL SPINE WITHOUT CONTRAST TECHNIQUE: Multidetector CT imaging of the head and cervical spine was performed following the standard protocol without intravenous contrast. Multiplanar CT image reconstructions of the cervical spine were also generated. COMPARISON:  CT cervical spine August 30, 2011. FINDINGS: CT HEAD FINDINGS Brain: No evidence of acute large vascular territory infarction, hemorrhage, hydrocephalus, extra-axial collection or mass lesion/mass effect. Mild for age scattered white matter hypodensities, most likely related to chronic microvascular ischemic  disease. Mild generalized cerebral atrophy with ex vacuo ventricular dilation. Vascular: Calcific atherosclerosis. No hyperdense vessel  identified. Skull: No acute fracture.  Left frontal scalp contusion Sinuses/Orbits: Visualized sinuses are clear. Bilateral scleral buckles. Other: No mastoid effusions. CT CERVICAL SPINE FINDINGS Alignment: Mild anterolisthesis of C4 on C5, favor degenerative given facet arthropathy at this level. Broad dextrocurvature of the cervical spine. Skull base and vertebrae: Vertebral body heights are maintained. No evidence of acute fracture. Soft tissues and spinal canal: No prevertebral fluid or swelling. No visible canal hematoma. Disc levels: Left eccentric degenerative disc disease in the lower cervical spine, greatest at C7-T1 with disc height loss and endplate sclerosis. Bulky facet hypertrophy at multiple levels, greatest on the left at C4-C5 where there is likely at least moderate bony foraminal stenosis. Upper chest: Emphysema. Lung apices appear clear although evaluation is limited by motion. IMPRESSION: CT head: 1. No evidence of acute intracranial abnormality. 2. Left frontal scalp contusion without acute fracture. CT cervical spine: 1. No evidence of acute fracture. 2. Rotation of C1 on C2 is likely positional in the absence of a fixed torticollis. 3. Bulky facet hypertrophy at multiple levels, greatest on the left at C4-C5 where there is likely at least moderate bony foraminal stenosis. 4. Left eccentric degenerative disc disease in the lower cervical spine, greatest at C7-T1. Electronically Signed   By: Margaretha Sheffield MD   On: 07/26/2020 17:59   US RENAL  Result Date: 07/28/2020 CLINICAL DATA:  Acute kidney injury EXAM: RENAL / URINARY TRACT ULTRASOUND COMPLETE COMPARISON:  None. FINDINGS: Right Kidney: Renal measurements: 10 x 6.2 x 5.7 cm = volume: 184.4 mL. Echogenicity within normal limits. No mass or hydronephrosis visualized. Left Kidney: Renal measurements:  10.2 x 6.6 x 6 cm = volume: 211.7 mL. Echogenicity within normal limits. 1.8 cm cyst at the upper pole. No hydronephrosis visualized. Bladder: Decompressed and not visualized Other: None. IMPRESSION: No hydronephrosis. Electronically Signed   By: Macy Mis M.D.   On: 07/28/2020 16:23   DG Pelvis Portable  Result Date: 07/26/2020 CLINICAL DATA:  Recent fall with pelvic pain, initial encounter EXAM: PORTABLE PELVIS 1-2 VIEWS COMPARISON:  02/24/2020 FINDINGS: Pelvic ring is intact. No acute fracture or dislocation is noted. Postsurgical changes are seen. No soft tissue abnormality is noted. Degenerative changes of lumbar spine are noted as well. Left upper quadrant calcification is noted similar to that seen on prior PET-CT. IMPRESSION: No acute abnormality noted. Electronically Signed   By: Inez Catalina M.D.   On: 07/26/2020 17:55   DG Chest Port 1 View  Result Date: 07/30/2020 CLINICAL DATA:  Respiratory failure EXAM: PORTABLE CHEST 1 VIEW COMPARISON:  07/26/2020 FINDINGS: The lungs are symmetrically well inflated. Minimal left basilar atelectasis. Mild coarsening of the pulmonary interstitium is again noted. No pneumothorax or pleural effusion. Cardiac size is mildly enlarged. Pulmonary vasculature is normal. No acute bone abnormality. IMPRESSION: No active disease.  Stable cardiomegaly. Electronically Signed   By: Fidela Salisbury MD   On: 07/30/2020 07:04   DG Chest Port 1 View  Result Date: 07/26/2020 CLINICAL DATA:  Recent fall EXAM: PORTABLE CHEST 1 VIEW COMPARISON:  07/06/2020 FINDINGS: Cardiac shadow is mildly enlarged but stable. Aortic calcifications are seen. Known right lower lobe mass lesion is not well appreciated on this exam. No infiltrate or sizable effusion is seen. No acute bony abnormality is noted. IMPRESSION: No acute abnormality noted. Electronically Signed   By: Inez Catalina M.D.   On: 07/26/2020 17:57   EEG adult  Result Date: 07/27/2020 Lora Havens, MD     07/27/2020  10:35  AM Patient Name: ORIAN FIGUEIRA MRN: [id number] Epilepsy Attending: Lora Havens Referring Physician/Provider: Dr. Kerney Elbe Date: 07/27/2020 Duration: 23.04 mins  Patient history: 80yo M with ams. EEG to evaluate for seizure  Level of alertness: Awake  AEDs during EEG study: None  Technical aspects: This EEG study was done with scalp electrodes positioned according to the 10-20 International system of electrode placement. Electrical activity was acquired at a sampling rate of 500Hz  and reviewed with a high frequency filter of 70Hz  and a low frequency filter of 1Hz . EEG data were recorded continuously and digitally stored.  Description: EEG showed continuous generalized 4.5-5Hz  theta slowing. Hyperventilation and photic stimulation were not performed.    ABNORMALITY -Continuous slow, generalized  IMPRESSION: This study is suggestive of moderate diffuse encephalopathy, nonspecific etiology. No seizures or epileptiform discharges were seen throughout the recording.   Priyanka O Yadav   VAS [place] LOWER EXTREMITY VENOUS (DVT)  Result Date: 08/08/2020  Lower Venous DVT Study Indications: Swelling, and Pain.  Comparison Study: No prior study Performing Technologist: Sharion Dove RVS  Examination Guidelines: A complete evaluation includes B-mode imaging, spectral Doppler, color Doppler, and power Doppler as needed of all accessible portions of each vessel. Bilateral testing is considered an integral part of a complete examination. Limited examinations for reoccurring indications may be performed as noted. The reflux portion of the exam is performed with the patient in reverse Trendelenburg.  +-----+---------------+---------+-----------+----------+--------------+ RIGHTCompressibilityPhasicitySpontaneityPropertiesThrombus Aging +-----+---------------+---------+-----------+----------+--------------+ CFV  Full           Yes      Yes                                  +-----+---------------+---------+-----------+----------+--------------+   +---------+---------------+---------+-----------+----------+--------------+ LEFT     CompressibilityPhasicitySpontaneityPropertiesThrombus Aging +---------+---------------+---------+-----------+----------+--------------+ CFV      Full           Yes      Yes                                 +---------+---------------+---------+-----------+----------+--------------+ SFJ      Full                                                        +---------+---------------+---------+-----------+----------+--------------+ FV Prox  Full                                                        +---------+---------------+---------+-----------+----------+--------------+ FV Mid   Full                                                        +---------+---------------+---------+-----------+----------+--------------+ FV DistalFull                                                        +---------+---------------+---------+-----------+----------+--------------+  PFV      Full                                                        +---------+---------------+---------+-----------+----------+--------------+ POP      Full           Yes      Yes                                 +---------+---------------+---------+-----------+----------+--------------+ PTV      Full                                                        +---------+---------------+---------+-----------+----------+--------------+ PERO     Full                                                        +---------+---------------+---------+-----------+----------+--------------+     Summary: RIGHT: - No evidence of common femoral vein obstruction.  LEFT: - There is no evidence of deep vein thrombosis in the lower extremity.  *See table(s) above for measurements and observations.    Preliminary    ECHOCARDIOGRAM LIMITED  Result Date: 07/27/2020     ECHOCARDIOGRAM LIMITED REPORT   Patient Name:   ALLEN EGERTON Date of Exam: 07/27/2020 Medical Rec #:  [id number]          Height:       72.0 in Accession #:    [id number]         Weight:       214.5 lb Date of Birth:  [birth year]          BSA:          2.195 m Patient Age:    76 years           BP:           105/57 mmHg Patient Gender: M                  HR:           77 bpm. Exam Location:  Inpatient Procedure: Limited Echo, Cardiac Doppler and Color Doppler Indications:    Congestive Heart Failure I50.9  History:        Patient has prior history of Echocardiogram examinations, most                 recent 03/03/2020. COPD, Arrythmias:Atrial Fibrillation; Risk                 Factors:Hypertension, Dyslipidemia and Former Smoker.  Sonographer:    Vickie Epley RDCS Referring Phys: [place]  1. Left ventricular ejection fraction, by estimation, is 55 to 60%. The left ventricle has normal function. Left ventricular diastolic function could not be evaluated.  2. The mitral valve is grossly normal. No evidence of mitral stenosis.  3. Tricuspid valve regurgitation is mild to moderate.  4. The aortic valve is grossly  normal. Aortic valve regurgitation is trivial. No aortic stenosis is present.  5. There is moderately elevated pulmonary artery systolic pressure. FINDINGS  Left Ventricle: Left ventricular ejection fraction, by estimation, is 55 to 60%. The left ventricle has normal function. Left ventricular diastolic function could not be evaluated. Left ventricular diastolic function could not be evaluated due to atrial  fibrillation. Right Ventricle: There is moderately elevated pulmonary artery systolic pressure. The tricuspid regurgitant velocity is 2.77 m/s, and with an assumed right atrial pressure of 15 mmHg, the estimated right ventricular systolic pressure is 28.3 mmHg. Mitral Valve: The mitral valve is grossly normal. No evidence of mitral valve stenosis. Tricuspid Valve: The tricuspid  valve is normal in structure. Tricuspid valve regurgitation is mild to moderate. Aortic Valve: The aortic valve is grossly normal. Aortic valve regurgitation is trivial. No aortic stenosis is present. Pulmonic Valve: The pulmonic valve was normal in structure. Pulmonic valve regurgitation is trivial. Aorta: The aortic root and ascending aorta are structurally normal, with no evidence of dilitation. LEFT VENTRICLE PLAX 2D LVIDd:         5.00 cm LVIDs:         3.80 cm LV PW:         1.00 cm LV IVS:        1.00 cm LVOT diam:     2.40 cm LVOT Area:     4.52 cm  LV Volumes (MOD) LV vol d, MOD A2C: 112.0 ml LV vol d, MOD A4C: 101.0 ml LV vol s, MOD A2C: 56.8 ml LV vol s, MOD A4C: 55.4 ml LV SV MOD A2C:     55.2 ml LV SV MOD A4C:     101.0 ml LV SV MOD BP:      49.6 ml RIGHT VENTRICLE TAPSE (M-mode): 1.5 cm LEFT ATRIUM         Index LA diam:    5.10 cm 2.32 cm/m   AORTA Ao Root diam: 3.80 cm Ao Asc diam:  3.60 cm TRICUSPID VALVE TR Peak grad:   30.7 mmHg TR Vmax:        277.00 cm/s  SHUNTS Systemic Diam: 2.40 cm Mertie Moores MD Electronically signed by Mertie Moores MD Signature Date/Time: 07/27/2020/3:32:59 PM    Final    CT CHEST ABDOMEN PELVIS WO CONTRAST  Result Date: 07/26/2020 CLINICAL DATA:  Sepsis and leukocytosis, recent fall, history of lung carcinoma EXAM: CT CHEST, ABDOMEN AND PELVIS WITHOUT CONTRAST TECHNIQUE: Multidetector CT imaging of the chest, abdomen and pelvis was performed following the standard protocol without IV contrast. COMPARISON:  Chest x-ray and pelvis film from earlier in the same day., 07/06/2020 FINDINGS: CT CHEST FINDINGS Cardiovascular: Atherosclerotic calcifications of the aorta are noted without aneurysmal dilatation. No cardiac enlargement is seen. No pericardial effusion is noted. 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 Mediastinum/Nodes: Thoracic inlet is within normal limits. No sizable hilar or mediastinal adenopathy is noted. The esophagus as visualized is within normal  limits. Lungs/Pleura: Left lung is well aerated with mild emphysematous changes. No focal infiltrate or sizable effusion is seen. Previously seen bilobed right lower lobe mass lesion is again identified and stable. Small right-sided pleural effusion is noted stable from the prior exam. Musculoskeletal: Degenerative changes of the thoracic spine are noted. No acute rib abnormality is seen. CT ABDOMEN PELVIS FINDINGS Hepatobiliary: No focal liver abnormality is seen. No gallstones, gallbladder wall thickening, or biliary dilatation. Pancreas: Unremarkable. No pancreatic ductal dilatation or surrounding inflammatory changes. Spleen: Normal in size without focal  abnormality. Adrenals/Urinary Tract: Adrenal glands again demonstrate calcification on the left consistent with prior adrenal hematoma. The overall appearance is stable. Right adrenal gland is within normal limits. Kidneys are well visualized bilaterally. Tiny nonobstructing left renal stones are noted. Ureters are within normal limits. Bladder is decompressed by Foley catheter. No obstructive changes are seen. Stomach/Bowel: Colon is well visualized with some fluid within. Considerable wall thickening is noted within the ascending colon new from prior PET-CT from 02/24/2020. These changes are consistent with focal colitis without evidence of perforation. Some pericolonic inflammatory changes are seen. Small bowel and stomach appear within normal limits. The appendix is not well visualized. Vascular/Lymphatic: Aortic atherosclerosis. No enlarged abdominal or pelvic lymph nodes. Reproductive: Prostate is unremarkable. Other: No abdominal wall hernia or abnormality. No abdominopelvic ascites. Musculoskeletal: No acute or significant osseous findings. IMPRESSION: CT of the chest: Stable right lower lobe mass lesion with associated right effusion similar to that seen on the prior exam. CT of the abdomen and pelvis: Tiny nonobstructing left renal stone. Changes of  ascending colitis without evidence of perforation or abscess formation. Stable calcified left adrenal hematoma. Aortic Atherosclerosis (ICD10-I70.0) and Emphysema (ICD10-J43.9). Electronically Signed   By: Inez Catalina M.D.   On: 07/26/2020 23:08    Lab Data:  CBC: Recent Labs  Lab 08/05/20 0301 08/06/20 0211 08/06/20 0751 08/06/20 1445 08/07/20 0139 08/07/20 0338 08/07/20 2149 08/08/20 0146  WBC 5.2 5.7 6.2  --  4.5  --   --  5.5  HGB 8.3* 7.2* 6.2* 7.1* 6.1* 6.1* 7.7* 7.6*  HCT 27.4* 25.3* 21.1* 23.2* 19.9* 19.4* 24.0* 23.0*  MCV 92.3 94.8 92.1  --  90.0  --   --  84.9  PLT 218 225 239  --  198  --   --  539   Basic Metabolic Panel: Recent Labs  Lab 08/03/20 0209 08/04/20 0226 08/05/20 0301 08/05/20 0948 08/06/20 0211 08/07/20 0139 08/08/20 0146  NA 142 143 144  --  145 144 141  K 3.7 3.9 4.0  --  4.1 3.5 3.4*  CL 103 103 102  --  103 106 101  CO2 27 29 30   --  34* 31 31  GLUCOSE 100* 112* 104*  --  139* 265* 110*  BUN 85* 86* 85*  --  88* 90* 78*  CREATININE 8.57* 7.36* 5.78*  --  4.76* 4.34* 3.45*  CALCIUM 7.7* 7.9* 8.0*  --  8.1* 7.6* 7.5*  MG  --   --  1.3* 1.7  --   --   --   PHOS 6.8* 6.6* 6.5*  --  6.1* 5.0*  --    GFR: Estimated Creatinine Clearance: 20.9 mL/min (A) (by C-G formula based on SCr of 3.45 mg/dL (H)). Liver Function Tests: Recent Labs  Lab 08/03/20 0209 08/04/20 0226 08/05/20 0301 08/06/20 0211 08/07/20 0139  ALBUMIN 2.6* 2.8* 3.0* 2.7* 2.0*   No results for input(s): LIPASE, AMYLASE in the last 168 hours. No results for input(s): AMMONIA in the last 168 hours. Coagulation Profile: No results for input(s): INR, PROTIME in the last 168 hours. Cardiac Enzymes: No results for input(s): CKTOTAL, CKMB, CKMBINDEX, TROPONINI in the last 168 hours. BNP (last 3 results) No results for input(s): PROBNP in the last 8760 hours. HbA1C: No results for input(s): HGBA1C in the last 72 hours. CBG: Recent Labs  Lab 08/07/20 1942 08/08/20 0156  08/08/20 0359 08/08/20 1210 08/08/20 1519  GLUCAP 133* 99 98 92 97   Lipid Profile: No results for  input(s): CHOL, HDL, LDLCALC, TRIG, CHOLHDL, LDLDIRECT in the last 72 hours. Thyroid Function Tests: No results for input(s): TSH, T4TOTAL, FREET4, T3FREE, THYROIDAB in the last 72 hours. Anemia Panel: No results for input(s): VITAMINB12, FOLATE, FERRITIN, TIBC, IRON, RETICCTPCT in the last 72 hours. Urine analysis:    Component Value Date/Time   COLORURINE AMBER (A) 07/26/2020 2153   APPEARANCEUR CLOUDY (A) 07/26/2020 2153   LABSPEC 1.016 07/26/2020 2153   PHURINE 5.0 07/26/2020 2153   GLUCOSEU NEGATIVE 07/26/2020 2153   HGBUR LARGE (A) 07/26/2020 2153   BILIRUBINUR NEGATIVE 07/26/2020 2153   KETONESUR NEGATIVE 07/26/2020 2153   PROTEINUR 100 (A) 07/26/2020 2153   UROBILINOGEN 0.2 11/13/2013 1429   NITRITE NEGATIVE 07/26/2020 2153   LEUKOCYTESUR NEGATIVE 07/26/2020 2153     Bonnell Public M.D. [facility] 08/08/2020, 3:40 PM  Available via Epic secure chat 7am-7pm After 7 pm, please refer to night coverage provider listed on amion.

## 2020-08-08 NOTE — Progress Notes (Signed)
Hospitalist notified pt restless and c/o heaviness and pain in LLE. LLE noted to have more edema than RLE and toes are cold to touch, however the rest of the leg is warm and the same temperature as all other extremities. All pulses palpable and equal 2+. Capillary refill is less than 3 sec for all extremities.

## 2020-08-09 DIAGNOSIS — R195 Other fecal abnormalities: Secondary | ICD-10-CM | POA: Diagnosis not present

## 2020-08-09 DIAGNOSIS — K921 Melena: Secondary | ICD-10-CM | POA: Diagnosis not present

## 2020-08-09 DIAGNOSIS — J9602 Acute respiratory failure with hypercapnia: Secondary | ICD-10-CM | POA: Diagnosis not present

## 2020-08-09 DIAGNOSIS — R933 Abnormal findings on diagnostic imaging of other parts of digestive tract: Secondary | ICD-10-CM | POA: Diagnosis not present

## 2020-08-09 DIAGNOSIS — D649 Anemia, unspecified: Secondary | ICD-10-CM | POA: Diagnosis not present

## 2020-08-09 LAB — BLOOD GAS, ARTERIAL
Acid-Base Excess: 4.8 mmol/L — ABNORMAL HIGH (ref 0.0–2.0)
Bicarbonate: 30.2 mmol/L — ABNORMAL HIGH (ref 20.0–28.0)
Drawn by: 33100
FIO2: 28
O2 Saturation: 97.1 %
Patient temperature: 36.7
pCO2 arterial: 56.9 mmHg — ABNORMAL HIGH (ref 32.0–48.0)
pH, Arterial: 7.343 — ABNORMAL LOW (ref 7.350–7.450)
pO2, Arterial: 93.3 mmHg (ref 83.0–108.0)

## 2020-08-09 LAB — CBC
HCT: 21.9 % — ABNORMAL LOW (ref 39.0–52.0)
Hemoglobin: 7.1 g/dL — ABNORMAL LOW (ref 13.0–17.0)
MCH: 28.1 pg (ref 26.0–34.0)
MCHC: 32.4 g/dL (ref 30.0–36.0)
MCV: 86.6 fL (ref 80.0–100.0)
Platelets: 203 10*3/uL (ref 150–400)
RBC: 2.53 MIL/uL — ABNORMAL LOW (ref 4.22–5.81)
RDW: 19.8 % — ABNORMAL HIGH (ref 11.5–15.5)
WBC: 4.8 10*3/uL (ref 4.0–10.5)
nRBC: 0 % (ref 0.0–0.2)

## 2020-08-09 LAB — HEPATIC FUNCTION PANEL
ALT: 40 U/L (ref 0–44)
AST: 41 U/L (ref 15–41)
Albumin: 2.4 g/dL — ABNORMAL LOW (ref 3.5–5.0)
Alkaline Phosphatase: 99 U/L (ref 38–126)
Bilirubin, Direct: <0.1 mg/dL (ref 0.0–0.2)
Total Bilirubin: 0.6 mg/dL (ref 0.3–1.2)
Total Protein: 4.6 g/dL — ABNORMAL LOW (ref 6.5–8.1)

## 2020-08-09 LAB — BASIC METABOLIC PANEL
Anion gap: 6 (ref 5–15)
BUN: 69 mg/dL — ABNORMAL HIGH (ref 8–23)
CO2: 32 mmol/L (ref 22–32)
Calcium: 7.4 mg/dL — ABNORMAL LOW (ref 8.9–10.3)
Chloride: 105 mmol/L (ref 98–111)
Creatinine, Ser: 3.02 mg/dL — ABNORMAL HIGH (ref 0.61–1.24)
GFR, Estimated: 20 mL/min — ABNORMAL LOW (ref >60)
Glucose, Bld: 108 mg/dL — ABNORMAL HIGH (ref 70–99)
Potassium: 3.5 mmol/L (ref 3.5–5.1)
Sodium: 143 mmol/L (ref 135–145)

## 2020-08-09 LAB — GLUCOSE, CAPILLARY
Glucose-Capillary: 98 mg/dL (ref 70–99)
Glucose-Capillary: 88 mg/dL (ref 70–99)
Glucose-Capillary: 76 mg/dL (ref 70–99)
Glucose-Capillary: 151 mg/dL — ABNORMAL HIGH (ref 70–99)
Glucose-Capillary: 157 mg/dL — ABNORMAL HIGH (ref 70–99)
Glucose-Capillary: 93 mg/dL (ref 70–99)

## 2020-08-09 LAB — HEMOGLOBIN AND HEMATOCRIT, BLOOD
HCT: 24.6 % — ABNORMAL LOW (ref 39.0–52.0)
Hemoglobin: 7.5 g/dL — ABNORMAL LOW (ref 13.0–17.0)

## 2020-08-09 LAB — TROPONIN I (HIGH SENSITIVITY)
Troponin I (High Sensitivity): 28 ng/L — ABNORMAL HIGH (ref <18)
Troponin I (High Sensitivity): 28 ng/L — ABNORMAL HIGH (ref <18)

## 2020-08-09 LAB — OCCULT BLOOD X 1 CARD TO LAB, STOOL: Fecal Occult Bld: POSITIVE — AB

## 2020-08-09 MED ORDER — BOOST / RESOURCE BREEZE PO LIQD CUSTOM
1.0000 | Freq: Three times a day (TID) | ORAL | Status: DC
  Administered 2020-08-09 – 2020-08-20 (×30): 1 via ORAL

## 2020-08-09 NOTE — Progress Notes (Signed)
Called to room due to patient complaining of "no air" through BIPAP. I explained nothing has changed on machine and it is working properly and he is getting air. He continued to state it was "dry and he felt like he was choking". I attempted to adjust pressures and pt continued to state "no air and dry". Pt taken off and on RA was 91%. Placed pt on 2L [place] and pt states that is better.

## 2020-08-09 NOTE — Progress Notes (Signed)
08/09/2019 10:05 PM RN called due to patient complaining of chest pressure and tightness.  He was in no acute distress.  Troponin was checked and was 28 (this was 522 on 3/7) ECG ordered and pending.  We shall continue to monitor patient.

## 2020-08-09 NOTE — Plan of Care (Signed)

## 2020-08-09 NOTE — Progress Notes (Signed)
Pt called RN into room. Pt noted to be very anxious and restless with shallow breathing. He was unable to tolerate his Bipap and removed it earlier. He is now on 2L [place] with O2 sat=high 90s. He admits to feeling a little SOB. R=19-20 HR=80s - a.fib. Lung sounds unchanged from previous assessment. Vital signs stable. Pt provided reassurance and emotional support by RN, assisted with repositioning and given a drink. Hospitalist updated. Will continue to monitor.

## 2020-08-09 NOTE — Plan of Care (Signed)
Nutrition Education Note  RD consulted for nutrition education regarding nutrition management for GERD/GI bleeds.    RD provided "Gastro Esophageal Reflex Disease (GERD) Nutrition Therapy" handout from the [employer] of [employer]. Reviewed patient's dietary recall and discussed ways for pt to meet nutrition goals. Explained reasons for pt to follow a blander GERD diet. Discussed best practice for long term management of blander diet and discussed that GERD is unique to every individual and if pt can have some foods on the not recommended list with no symptoms, that it is okay to have those.  Teach back method used. Pt verbalizes understanding of information provided.   Expect good compliance.  Body mass index is Body mass index is 29.74 kg/m.Marland Kitchen Pt meets criteria for overweight based on current BMI.  Current diet order is clear liquids given possible GI bleed. Pt requested Boost Breeze while on clears. Labs and medications reviewed.   No further nutrition interventions warranted at this time. RD contact information provided. If additional nutrition issues arise, please re-consult RD.  Derrel Nip, RD, LDN Registered Dietitian After Hours/Weekend Pager # in [place]

## 2020-08-09 NOTE — Progress Notes (Signed)
PROGRESS NOTE    Todd Lloyd  YSA:[id number] DOB: 1941-05-06 DOA: 07/26/2020 PCP: Haywood Pao, MD    Brief Narrative:  This 80 year old male with history of COPD, stage I non-small cell lung cancer s/p radiation in 04/2020, dementia, paroxysmal A. fib on Eliquis, GERD, leukemia s/p bone marrow transplantation in 1990 was admitted to ICU under PCCM service with altered mental status after a fall and diarrhea. He was found to be in shock / respiratory failure requiring BiPAP, acute kidney injury, rhabdomyolysis.  CT of the head and C-spine were negative for acute abnormalities. CT of the abdomen and pelvis showed focal colitis without evidence of perforation. Neurology and nephrology were consulted. He was treated with bicarb drip. EEG was negative for seizures. He was treated with broad-spectrum antibiotics initially; stool was positive for rotavirus and enteropathogenic E. Coli, received Zithromax. He was treated with intravenous fluids initially. Urine output has been minimal and he has been getting intermittent Lasix recently by nephrology. Mental status has improved; Neurology signed off. Patient was transferred to [facility] service on 07/31/2020. GI has been consulted for acute on chronic blood loss anemia.  Patient has had several bowel movement with thick dark blood clots.  Patient has received so far 4 units of PRBCs.  Hemoglobin today 7.5,  seems stable.  Monitor H&H.  May need intervention if He continues have bleeding.  Assessment & Plan:   Principal Problem:   Acute hypercapnic respiratory failure (HCC) Active Problems:   Chronic atrial fibrillation (HCC)   COPD (chronic obstructive pulmonary disease) (HCC)   Chronic anticoagulation   Solitary pulmonary nodule   SIRS (systemic inflammatory response syndrome) (HCC)   AKI (acute kidney injury) (HCC)   Elevated brain natriuretic peptide (BNP) level   Volume overload   Hypotension   Elevated CK   Abnormal CT scan,  colon   Hematochezia   Acute on chronic blood loss anemia, ongoing GI bleed, hypotension  -Patient presented with clots with large BM, Eliquis was placed on hold -Hemoglobin down to 6.2 on 3/19, Has received 4 units PRBC so far. Hb 7.5 today. -Patient has had several BMs with thick dark clots since then. -Placed on IV PPI drip, consulted with Gastroenterology -Unable to get CTA abdomen due to renal dysfunction, creatinine 4.76.   -Currently not stable for tagged RBC scan with hypotension. - Kcentra was given for Eliquis reversal, last Eliquis dose  3/19 - CCM consulted for hemorrhagic shock, BP was in 80s. Now improved,. 08/08/2020: H&H is stable.  No further bleeding reported. 3/21: Hemoglobin 7.5.  No further bleeding noted. GI recommended monitor H&H, continue PPI every 12h,  bleeding has stopped,  notify GI if has further bleeding.  Acute kidney injury: > Improving - Likely secondary from ATN, septic shock and rhabdomyolysis -Initially treated with IV fluids, subsequently with intermittent Lasix.  Nonoliguric -Nephrology following, creatinine appears to be trending down -Creatinine improving, 3.02 today, AKI improving. -CK has trended down. -Renal ultrasound negative for obstruction, continue to hold losartan, avoid nephrotoxins 08/07/2020: AKI is multifactorial.  Full recovery, if possible, will take a long time.  08/08/2020: Slight improvement in renal function is noted.  Continue to monitor closely.  Acute hypoxic and hypercarbic respiratory failure: sec. to acute COPD exacerbation -On 3/17, patient found to be in hypercarbic respiratory failure, lethargic not responding to verbal commands.  ABG showed pH of 7.2, PCO2 70.1.  Overnight patient had refused BiPAP. - Placed on BiPAP, alert and oriented this morning - 2D echo showed  EF of 55 to 60%.  - Patient needs to have BiPAP QHS and PRN, however had been refusing.  Acute metabolic encephalopathy superimposed on dementia,  myoclonic jerks : -Likely due to hypercarbic respiratory failure, now improved, alert and oriented this morning.   - Patient follows neurology outpatient, Dr. [name].  --CT head negative for acute intracranial pathology -Melatonin discontinued,  avoid sedating meds, BiPAP nightly -Patient had EEG during admission, negative for seizures.  - MRI was initially deferred due to rapid improvement in his mental status. -Continue Namenda, Aricept, fall precautions  Septic shock, present on admission.  Rotavirus and enteropathogenic E. coli diarrhea > Resolved. -Initially treated with aggressive IV fluid hydration, broad-spectrum antibiotics. -Also received empiric antibiotics for meningitis due to altered mental status, DC'd on 07/27/2020. -Blood cultures, urine cultures negative, Covid/flu negative, C. difficile negative -GI PCR was positive for rotavirus/enteropathogenic E. coli, received a dose of Zithromax on 07/28/2020 -Antibiotics have been subsequently discontinued.     History of stage I right lung cancer status post XRT -Outpatient follow-up with oncology  Paroxysmal atrial fibrillation : -Rate controlled, digoxin on hold -Continue to Hold Eliquis  Leukocytosis -Resolved  Thrombocytopenia : monitor platelets  GERD -Continue PPI.  Obesity Estimated body mass index is 29.74 kg/m as calculated from the following:   Height as of this encounter: 6' (1.829 m).   Weight as of this encounter: 99.5 kg.   DVT prophylaxis: SCDs Code Status: DNR Family Communication: No family at bed side, Disposition Plan:   Status is: Inpatient  Remains inpatient appropriate because:Inpatient level of care appropriate due to severity of illness   Dispo: The patient is from: Home              Anticipated d/c is to: SNF              Patient currently is not medically stable to d/c.   Difficult to place patient No   Consultants:   Gastroenterology  Procedures: 2D  Echo  Antimicrobials:     Anti-infectives (From admission, onward)   Start     Dose/Rate Route Frequency Ordered Stop   07/28/20 0845  azithromycin (ZITHROMAX) tablet 1,000 mg        1,000 mg Oral  Once 07/28/20 0747 07/28/20 0827   07/28/20 0608  cefTRIAXone (ROCEPHIN) 2 g in sodium chloride 0.9 % 100 mL IVPB  Status:  Discontinued        2 g 200 mL/hr over 30 Minutes Intravenous Every 24 hours 07/27/20 1447 07/28/20 0747   07/27/20 1045  metroNIDAZOLE (FLAGYL) IVPB 500 mg        500 mg 100 mL/hr over 60 Minutes Intravenous  Once 07/27/20 0956 07/27/20 1113   07/26/20 1930  acyclovir (ZOVIRAX) 775 mg in dextrose 5 % 150 mL IVPB  Status:  Discontinued        10 mg/kg  77.6 kg (Ideal) 165.5 mL/hr over 60 Minutes Intravenous Every 24 hours 07/26/20 1848 07/27/20 1424   07/26/20 1915  ampicillin (OMNIPEN) 2 g in sodium chloride 0.9 % 100 mL IVPB  Status:  Discontinued        2 g 300 mL/hr over 20 Minutes Intravenous Every 8 hours 07/26/20 1848 07/27/20 1424   07/26/20 1900  cefTRIAXone (ROCEPHIN) 2 g in sodium chloride 0.9 % 100 mL IVPB  Status:  Discontinued        2 g 200 mL/hr over 30 Minutes Intravenous Every 12 hours 07/26/20 1846 07/27/20 1447   07/26/20 1851  vancomycin variable dose per unstable renal function (pharmacist dosing)  Status:  Discontinued         Does not apply See admin instructions 07/26/20 1853 07/27/20 1424   07/26/20 1815  ceFEPIme (MAXIPIME) 2 g in sodium chloride 0.9 % 100 mL IVPB  Status:  Discontinued        2 g 200 mL/hr over 30 Minutes Intravenous  Once 07/26/20 1802 07/26/20 1846   07/26/20 1815  metroNIDAZOLE (FLAGYL) IVPB 500 mg        500 mg 100 mL/hr over 60 Minutes Intravenous  Once 07/26/20 1802 07/26/20 2049   07/26/20 1815  vancomycin (VANCOREADY) IVPB 1750 mg/350 mL        1,750 mg 175 mL/hr over 120 Minutes Intravenous  Once 07/26/20 1802 07/26/20 2228     Subjective: Patient was seen and examined at bedside.  He was sitting on the chair  comfortably with supplemental oxygen via nasal cannula 2 L sats 94%.  Patient does have Foley catheter with significant scrotal swelling.  Patient reports no further bleeding.  Objective: Vitals:   08/09/20 0313 08/09/20 0400 08/09/20 0817 08/09/20 1250  BP: (!) 132/48 (!) 136/56  (!) 152/57  Pulse: 97 86    Resp: 19 (!) 21  17  Temp: 98 F (36.7 C)   98.2 F (36.8 C)  TempSrc: Oral   Oral  SpO2: 98% 98% 98% 94%  Weight:      Height:        Intake/Output Summary (Last 24 hours) at 08/09/2020 1348 Last data filed at 08/09/2020 0600 Gross per 24 hour  Intake 486.82 ml  Output 2425 ml  Net -1938.18 ml   Filed Weights   08/05/20 0531 08/06/20 0516 08/07/20 0426  Weight: 102.3 kg 99 kg 99.5 kg    Examination:  General exam: Appears calm and comfortable, not in any acute distress. Respiratory system: Clear to auscultation. Respiratory effort normal. Cardiovascular system: S1 & S2 heard, RRR. No JVD, murmurs, rubs, gallops or clicks. No pedal edema. Gastrointestinal system: Abdomen is nondistended, soft and nontender. No organomegaly or masses felt. Normal bowel sounds heard. Central nervous system: Alert and oriented. No focal neurological deficits. Extremities: Symmetric 5 x 5 power. No edema, no cyanosis, no clubbing. Skin: No rashes, lesions or ulcers Psychiatry: Judgement and insight appear normal. Mood & affect appropriate.     Data Reviewed: I have personally reviewed following labs and imaging studies  CBC: Recent Labs  Lab 08/06/20 0211 08/06/20 0751 08/06/20 1445 08/07/20 0139 08/07/20 0338 08/07/20 2149 08/08/20 0146 08/09/20 0122 08/09/20 1012  WBC 5.7 6.2  --  4.5  --   --  5.5 4.8  --   HGB 7.2* 6.2*   < > 6.1* 6.1* 7.7* 7.6* 7.1* 7.5*  HCT 25.3* 21.1*   < > 19.9* 19.4* 24.0* 23.0* 21.9* 24.6*  MCV 94.8 92.1  --  90.0  --   --  84.9 86.6  --   PLT 225 239  --  198  --   --  211 203  --    < > = values in this interval not displayed.   Basic  Metabolic Panel: Recent Labs  Lab 08/03/20 0209 08/04/20 0226 08/05/20 0301 08/05/20 0948 08/06/20 0211 08/07/20 0139 08/08/20 0146 08/09/20 0122  NA 142 143 144  --  145 144 141 143  K 3.7 3.9 4.0  --  4.1 3.5 3.4* 3.5  CL 103 103 102  --  103 106 101 105  CO2 27 29 30   --  34* 31 31 32  GLUCOSE 100* 112* 104*  --  139* 265* 110* 108*  BUN 85* 86* 85*  --  88* 90* 78* 69*  CREATININE 8.57* 7.36* 5.78*  --  4.76* 4.34* 3.45* 3.02*  CALCIUM 7.7* 7.9* 8.0*  --  8.1* 7.6* 7.5* 7.4*  MG  --   --  1.3* 1.7  --   --   --   --   PHOS 6.8* 6.6* 6.5*  --  6.1* 5.0*  --   --    GFR: Estimated Creatinine Clearance: 23.8 mL/min (A) (by C-G formula based on SCr of 3.02 mg/dL (H)). Liver Function Tests: Recent Labs  Lab 08/04/20 0226 08/05/20 0301 08/06/20 0211 08/07/20 0139 08/09/20 0336  AST  --   --   --   --  41  ALT  --   --   --   --  40  ALKPHOS  --   --   --   --  99  BILITOT  --   --   --   --  0.6  PROT  --   --   --   --  4.6*  ALBUMIN 2.8* 3.0* 2.7* 2.0* 2.4*   No results for input(s): LIPASE, AMYLASE in the last 168 hours. No results for input(s): AMMONIA in the last 168 hours. Coagulation Profile: No results for input(s): INR, PROTIME in the last 168 hours. Cardiac Enzymes: No results for input(s): CKTOTAL, CKMB, CKMBINDEX, TROPONINI in the last 168 hours. BNP (last 3 results) No results for input(s): PROBNP in the last 8760 hours. HbA1C: No results for input(s): HGBA1C in the last 72 hours. CBG: Recent Labs  Lab 08/08/20 2215 08/08/20 2342 08/09/20 0311 08/09/20 0756 08/09/20 1249  GLUCAP 100* 100* 98 88 76   Lipid Profile: No results for input(s): CHOL, HDL, LDLCALC, TRIG, CHOLHDL, LDLDIRECT in the last 72 hours. Thyroid Function Tests: No results for input(s): TSH, T4TOTAL, FREET4, T3FREE, THYROIDAB in the last 72 hours. Anemia Panel: No results for input(s): VITAMINB12, FOLATE, FERRITIN, TIBC, IRON, RETICCTPCT in the last 72 hours. Sepsis Labs: No  results for input(s): PROCALCITON, LATICACIDVEN in the last 168 hours.  No results found for this or any previous visit (from the past 240 hour(s)).       Radiology Studies: VAS [place] LOWER EXTREMITY VENOUS (DVT)  Result Date: 08/09/2020  Lower Venous DVT Study Indications: Swelling, and Pain.  Comparison Study: No prior study Performing Technologist: Sharion Dove RVS  Examination Guidelines: A complete evaluation includes B-mode imaging, spectral Doppler, color Doppler, and power Doppler as needed of all accessible portions of each vessel. Bilateral testing is considered an integral part of a complete examination. Limited examinations for reoccurring indications may be performed as noted. The reflux portion of the exam is performed with the patient in reverse Trendelenburg.  +-----+---------------+---------+-----------+----------+--------------+ RIGHTCompressibilityPhasicitySpontaneityPropertiesThrombus Aging +-----+---------------+---------+-----------+----------+--------------+ CFV  Full           Yes      Yes                                 +-----+---------------+---------+-----------+----------+--------------+   +---------+---------------+---------+-----------+----------+--------------+ LEFT     CompressibilityPhasicitySpontaneityPropertiesThrombus Aging +---------+---------------+---------+-----------+----------+--------------+ CFV      Full           Yes      Yes                                 +---------+---------------+---------+-----------+----------+--------------+  SFJ      Full                                                        +---------+---------------+---------+-----------+----------+--------------+ FV Prox  Full                                                        +---------+---------------+---------+-----------+----------+--------------+ FV Mid   Full                                                         +---------+---------------+---------+-----------+----------+--------------+ FV DistalFull                                                        +---------+---------------+---------+-----------+----------+--------------+ PFV      Full                                                        +---------+---------------+---------+-----------+----------+--------------+ POP      Full           Yes      Yes                                 +---------+---------------+---------+-----------+----------+--------------+ PTV      Full                                                        +---------+---------------+---------+-----------+----------+--------------+ PERO     Full                                                        +---------+---------------+---------+-----------+----------+--------------+     Summary: RIGHT: - No evidence of common femoral vein obstruction.  LEFT: - There is no evidence of deep vein thrombosis in the lower extremity.  *See table(s) above for measurements and observations. Electronically signed by Servando Snare MD on 08/09/2020 at 11:19:19 AM.    Final    Scheduled Meds: . acetaminophen  650 mg Oral TID  . budesonide (PULMICORT) nebulizer solution  0.5 mg Nebulization BID  . chlorhexidine  15 mL Mouth Rinse BID  . Chlorhexidine Gluconate Cloth  6 each Topical Q0600  . donepezil  5 mg Oral QHS  . feeding supplement  1 Container Oral TID WC  .  mouth rinse  15 mL Mouth Rinse q12n4p  . memantine  5 mg Oral BID  . pantoprazole  40 mg Oral Daily   Continuous Infusions: . sodium chloride 10 mL/hr at 07/30/20 1800  . lactated ringers 10 mL/hr at 07/30/20 1800     LOS: 14 days    Time spent: 35 mins    Deb Loudin, MD [facility]   If 7PM-7AM, please contact night-coverage

## 2020-08-09 NOTE — Progress Notes (Signed)
Pt c/o midsternal "chest pressure and tightness" along with back pain. Pt stated chest pressure started after getting up to walk during the day and has not subsided, but has not worsened. Pt states back pain is his usual chronic back pain. Pt appears anxious and repeatedly asks RN to check on him throughout the night. Pt denies SOB other than baseline dyspnea on exertion. Vital signs are stable. Hospitalist notified.

## 2020-08-09 NOTE — Progress Notes (Signed)
[facility] Progress Note  CC:  Hematochezia, right sided colitis  Subjective: He passed a moderate amount of darker red blood mixed with stool this am x 1. No N/V. No abdominal pain. He ate a soft diet for breakfast. He is quite frustrated regarding his bloody BM this morning. He had a brief episode of chest pain around 4am which resolved. He remains a little anxious. He is on oxygen 2 L [place].    Objective:   Chest/abd/pelvic CT WO contrast 07/26/2020: CT of the chest: Stable right lower lobe mass lesion with associated right effusion similar to that seen on the prior exam. CT of the abdomen and pelvis: Tiny nonobstructing left renal stone. Changes of ascending colitis without evidence of perforation or abscess formation. Stable calcified left adrenal hematoma. Aortic Atherosclerosis and Emphysema   Vital signs in last 24 hours: Temp:  [97.9 F (36.6 C)-98.7 F (37.1 C)] 98 F (36.7 C) (03/21 0313) Pulse Rate:  [79-97] 86 (03/21 0400) Resp:  [14-21] 21 (03/21 0400) BP: (115-169)/(48-87) 136/56 (03/21 0400) SpO2:  [95 %-98 %] 98 % (03/21 0817) Last BM Date: 08/06/20 General:   Alert 80 year old male in NAD, sitting up in the chair.  Heart: Irregular rhythm, no murmur. Pulm:  Diminished breath sounds throughout.  Abdomen: Soft, protuberant, nontender. RUQ scar. Supraumbilical hernia. Positive bowel sounds x 4 quads.  Extremities:  1+ bilateral LE edema.  Neurologic:  Alert and  oriented x4. Speech is clear. Moves all extremities.  Psych:  Alert and cooperative. Normal mood and affect.  Intake/Output from previous day: 03/20 0701 - 03/21 0700 In: 486.8 [I.V.:486.8] Out: 2425 [Urine:2425] Intake/Output this shift: No intake/output data recorded.  Lab Results: Recent Labs    08/07/20 0139 08/07/20 0338 08/07/20 2149 08/08/20 0146 08/09/20 0122  WBC 4.5  --   --  5.5 4.8  HGB 6.1*   < > 7.7* 7.6* 7.1*  HCT 19.9*   < > 24.0* 23.0* 21.9*  PLT 198   --   --  211 203   < > = values in this interval not displayed.   BMET Recent Labs    08/07/20 0139 08/08/20 0146 08/09/20 0122  NA 144 141 143  K 3.5 3.4* 3.5  CL 106 101 105  CO2 31 31 32  GLUCOSE 265* 110* 108*  BUN 90* 78* 69*  CREATININE 4.34* 3.45* 3.02*  CALCIUM 7.6* 7.5* 7.4*   LFT Recent Labs    08/07/20 0139  ALBUMIN 2.0*   PT/INR No results for input(s): LABPROT, INR in the last 72 hours. Hepatitis Panel No results for input(s): HEPBSAG, HCVAB, HEPAIGM, HEPBIGM in the last 72 hours.  VAS [place] LOWER EXTREMITY VENOUS (DVT)  Result Date: 08/08/2020  Lower Venous DVT Study Indications: Swelling, and Pain.  Comparison Study: No prior study Performing Technologist: Sharion Dove RVS  Examination Guidelines: A complete evaluation includes B-mode imaging, spectral Doppler, color Doppler, and power Doppler as needed of all accessible portions of each vessel. Bilateral testing is considered an integral part of a complete examination. Limited examinations for reoccurring indications may be performed as noted. The reflux portion of the exam is performed with the patient in reverse Trendelenburg.  +-----+---------------+---------+-----------+----------+--------------+ RIGHTCompressibilityPhasicitySpontaneityPropertiesThrombus Aging +-----+---------------+---------+-----------+----------+--------------+ CFV  Full           Yes      Yes                                 +-----+---------------+---------+-----------+----------+--------------+   +---------+---------------+---------+-----------+----------+--------------+  LEFT     CompressibilityPhasicitySpontaneityPropertiesThrombus Aging +---------+---------------+---------+-----------+----------+--------------+ CFV      Full           Yes      Yes                                 +---------+---------------+---------+-----------+----------+--------------+ SFJ      Full                                                         +---------+---------------+---------+-----------+----------+--------------+ FV Prox  Full                                                        +---------+---------------+---------+-----------+----------+--------------+ FV Mid   Full                                                        +---------+---------------+---------+-----------+----------+--------------+ FV DistalFull                                                        +---------+---------------+---------+-----------+----------+--------------+ PFV      Full                                                        +---------+---------------+---------+-----------+----------+--------------+ POP      Full           Yes      Yes                                 +---------+---------------+---------+-----------+----------+--------------+ PTV      Full                                                        +---------+---------------+---------+-----------+----------+--------------+ PERO     Full                                                        +---------+---------------+---------+-----------+----------+--------------+     Summary: RIGHT: - No evidence of common femoral vein obstruction.  LEFT: - There is no evidence of deep vein thrombosis in the lower extremity.  *See table(s) above for measurements and observations.    Preliminary     Assessment /  Plan:  28. 80 year old male admitted to the hospital 3/18 with painless hematochezia with anemia. 2. Anemia. Hg 6.1 -> 7.6. Transfused 4 units of PRBCs on 3/19. Today Hg 7.1. He passed a moderate amount of darker red blood mixed with loose stool x 1 this am. Repeat Hg at 10am 7.5. CTAP 3/07 consistent with ascending colitis, ischemic vs. Infectious colitis. GI pathogen panel 3/8 positive for rotavirus and enteropathogenic E. coli treated with Ampicillin, Azithromycin, Cefepime, Ceftriaxone, Vancomycin, Metronidazole at various points during his 12-day  hospitalization. He remains hemodynamically stable.  -Clear liquid diet for now -Monitor H/H closely  -Continue Pantoprazole 40mg  po QD -Continue to monitor the patient for active GI bleeding, nursing staff to call our GI service if patient has further bloody BMs.  -Await further recommendations from Dr. Havery Moros later today   2. History of atrial fibrillation. Eliquis on hold.   3. AKI on CKD. Cr 3.45 -> 3.02  4. Rhabdomyolysis. AST 271 and ALT 117 on 3/9.  -Add hepatic panel to earlier am lab draw  5.  Hypocalcemia. Ca++ level 7.4.  -Calcium replacement per the hospitalist   7. History of stage I lung cancer s/p radiation. Chest CT 3/7 showed a stable right lower lobe mass with associated right effusion.   8. History of Leukemia s/p bone marrow transplant 1990  9. History of colon polyps. Last colonoscopy with Dr .Ardis Hughs 01/2016 identified 11 polyps from throughout the colon measuring 3 to 10 mm in size.  No mention of diverticulosis.  Pathology showed 8 tubular adenomas without high-grade dysplasia and a sessile serrated polyp/adenoma.  No dysplasia, no malignancy.  Principal Problem:   Acute hypercapnic respiratory failure (HCC) Active Problems:   Chronic atrial fibrillation (HCC)   COPD (chronic obstructive pulmonary disease) (HCC)   Chronic anticoagulation   Solitary pulmonary nodule   SIRS (systemic inflammatory response syndrome) (HCC)   AKI (acute kidney injury) (HCC)   Elevated brain natriuretic peptide (BNP) level   Volume overload   Hypotension   Elevated CK   Abnormal CT scan, colon   Hematochezia     LOS: 14 days   Patrecia Pour Kennedy-Smith  08/09/2020, 10:21 AM

## 2020-08-09 NOTE — Progress Notes (Signed)
Hospitalist notified of K+ = 3.4

## 2020-08-10 ENCOUNTER — Inpatient Hospital Stay (HOSPITAL_COMMUNITY): Payer: Medicare Other

## 2020-08-10 DIAGNOSIS — A09 Infectious gastroenteritis and colitis, unspecified: Secondary | ICD-10-CM

## 2020-08-10 DIAGNOSIS — K921 Melena: Secondary | ICD-10-CM | POA: Diagnosis not present

## 2020-08-10 DIAGNOSIS — Z8719 Personal history of other diseases of the digestive system: Secondary | ICD-10-CM

## 2020-08-10 DIAGNOSIS — R933 Abnormal findings on diagnostic imaging of other parts of digestive tract: Secondary | ICD-10-CM | POA: Diagnosis not present

## 2020-08-10 DIAGNOSIS — J9602 Acute respiratory failure with hypercapnia: Secondary | ICD-10-CM | POA: Diagnosis not present

## 2020-08-10 LAB — CBC
HCT: 22.9 % — ABNORMAL LOW (ref 39.0–52.0)
Hemoglobin: 7.1 g/dL — ABNORMAL LOW (ref 13.0–17.0)
MCH: 27.7 pg (ref 26.0–34.0)
MCHC: 31 g/dL (ref 30.0–36.0)
MCV: 89.5 fL (ref 80.0–100.0)
Platelets: 209 10*3/uL (ref 150–400)
RBC: 2.56 MIL/uL — ABNORMAL LOW (ref 4.22–5.81)
RDW: 19.5 % — ABNORMAL HIGH (ref 11.5–15.5)
WBC: 4.6 10*3/uL (ref 4.0–10.5)
nRBC: 0 % (ref 0.0–0.2)

## 2020-08-10 LAB — GLUCOSE, CAPILLARY
Glucose-Capillary: 86 mg/dL (ref 70–99)
Glucose-Capillary: 86 mg/dL (ref 70–99)
Glucose-Capillary: 101 mg/dL — ABNORMAL HIGH (ref 70–99)
Glucose-Capillary: 67 mg/dL — ABNORMAL LOW (ref 70–99)
Glucose-Capillary: 93 mg/dL (ref 70–99)
Glucose-Capillary: 91 mg/dL (ref 70–99)

## 2020-08-10 LAB — BASIC METABOLIC PANEL
Anion gap: 7 (ref 5–15)
BUN: 54 mg/dL — ABNORMAL HIGH (ref 8–23)
CO2: 32 mmol/L (ref 22–32)
Calcium: 7.5 mg/dL — ABNORMAL LOW (ref 8.9–10.3)
Chloride: 104 mmol/L (ref 98–111)
Creatinine, Ser: 2.21 mg/dL — ABNORMAL HIGH (ref 0.61–1.24)
GFR, Estimated: 29 mL/min — ABNORMAL LOW (ref >60)
Glucose, Bld: 100 mg/dL — ABNORMAL HIGH (ref 70–99)
Potassium: 3.5 mmol/L (ref 3.5–5.1)
Sodium: 143 mmol/L (ref 135–145)

## 2020-08-10 LAB — HEMOGLOBIN AND HEMATOCRIT, BLOOD
HCT: 22.2 % — ABNORMAL LOW (ref 39.0–52.0)
HCT: 24.7 % — ABNORMAL LOW (ref 39.0–52.0)
Hemoglobin: 7 g/dL — ABNORMAL LOW (ref 13.0–17.0)
Hemoglobin: 7.5 g/dL — ABNORMAL LOW (ref 13.0–17.0)

## 2020-08-10 LAB — MAGNESIUM: Magnesium: 1.1 mg/dL — ABNORMAL LOW (ref 1.7–2.4)

## 2020-08-10 LAB — PHOSPHORUS: Phosphorus: 5 mg/dL — ABNORMAL HIGH (ref 2.5–4.6)

## 2020-08-10 MED ORDER — MAGNESIUM SULFATE 2 GM/50ML IV SOLN
2.0000 g | Freq: Once | INTRAVENOUS | Status: AC
  Administered 2020-08-10: 2 g via INTRAVENOUS
  Filled 2020-08-10: qty 50

## 2020-08-10 MED ORDER — CALCIUM CARBONATE 1250 (500 CA) MG PO TABS
1.0000 | ORAL_TABLET | Freq: Every day | ORAL | Status: DC
  Administered 2020-08-11 – 2020-08-20 (×10): 500 mg via ORAL
  Filled 2020-08-10 (×11): qty 1

## 2020-08-10 NOTE — Progress Notes (Signed)
Blood glucose not done at 12 by CNA.  Will get the one for 1800.

## 2020-08-10 NOTE — Progress Notes (Signed)
[facility] Progress Note  CC:  Hematochezia, right sided colitis   Subjective: He complains of being fatigued and he is hungry. He remains on a clear liquid diet. His RN reported he passed 2 small red bloody stools yesterday evening around 7pm and 8pm and 2 small dark stools over night. No further BMs this morning. No N/V. No abdominal pain. No family at the bedside.   Objective:  Chest/abd/pelvic CT WO contrast 07/26/2020: CT of the chest: Stable right lower lobe mass lesion with associated right effusion similar to that seen on the prior exam. CT of the abdomen and pelvis: Tiny nonobstructing left renal stone. Changes of ascending colitis without evidence of perforation or abscess formation. Stable calcified left adrenal hematoma. Aortic Atherosclerosis and Emphysema    Vital signs in last 24 hours: Temp:  [97.6 F (36.4 C)-98.2 F (36.8 C)] 97.7 F (36.5 C) (03/22 0800) Pulse Rate:  [76-85] 85 (03/22 0800) Resp:  [17-24] 18 (03/22 0800) BP: (138-157)/(52-79) 146/79 (03/22 0800) SpO2:  [94 %-100 %] 100 % (03/22 0800) FiO2 (%):  [30 %] 30 % (03/21 2320) Last BM Date: 08/09/20 General:   Alert ill appearing 80 year old male in NAD.  Heart: Irregular rhythm, no murmur. Pulm: Breath sound diminished throughout, few expiratory wheezes. On oxygen 2L [place].  Abdomen:  Soft, protuberant, nontender. RUQ scar. Supraumbilical hernia. Positive bowel sounds x 4 quads Extremities:  UEs with 1+ edema. RLE 1+ edema. LLE 2+ edema.  Neurologic:  Alert and  oriented x4. Speech is clear. Moves all extremities weakly.  Psych:  Alert and cooperative. Normal mood and affect.  Intake/Output from previous day: 03/21 0701 - 03/22 0700 In: -  Out: 1700 [Urine:1700] Intake/Output this shift: No intake/output data recorded.  Lab Results: Recent Labs    08/08/20 0146 08/09/20 0122 08/09/20 1012 08/10/20 0129 08/10/20 0731  WBC 5.5 4.8  --  4.6  --   HGB 7.6* 7.1* 7.5*  7.1* 7.0*  HCT 23.0* 21.9* 24.6* 22.9* 22.2*  PLT 211 203  --  209  --    BMET Recent Labs    08/08/20 0146 08/09/20 0122 08/10/20 0129  NA 141 143 143  K 3.4* 3.5 3.5  CL 101 105 104  CO2 31 32 32  GLUCOSE 110* 108* 100*  BUN 78* 69* 54*  CREATININE 3.45* 3.02* 2.21*  CALCIUM 7.5* 7.4* 7.5*   LFT Recent Labs    08/09/20 0336  PROT 4.6*  ALBUMIN 2.4*  AST 41  ALT 40  ALKPHOS 99  BILITOT 0.6  BILIDIR <0.1  IBILI NOT CALCULATED   PT/INR No results for input(s): LABPROT, INR in the last 72 hours. Hepatitis Panel No results for input(s): HEPBSAG, HCVAB, HEPAIGM, HEPBIGM in the last 72 hours.  VAS [place] LOWER EXTREMITY VENOUS (DVT)  Result Date: 08/09/2020  Lower Venous DVT Study Indications: Swelling, and Pain.  Comparison Study: No prior study Performing Technologist: Sharion Dove RVS  Examination Guidelines: A complete evaluation includes B-mode imaging, spectral Doppler, color Doppler, and power Doppler as needed of all accessible portions of each vessel. Bilateral testing is considered an integral part of a complete examination. Limited examinations for reoccurring indications may be performed as noted. The reflux portion of the exam is performed with the patient in reverse Trendelenburg.  +-----+---------------+---------+-----------+----------+--------------+ RIGHTCompressibilityPhasicitySpontaneityPropertiesThrombus Aging +-----+---------------+---------+-----------+----------+--------------+ CFV  Full           Yes      Yes                                 +-----+---------------+---------+-----------+----------+--------------+   +---------+---------------+---------+-----------+----------+--------------+  LEFT     CompressibilityPhasicitySpontaneityPropertiesThrombus Aging +---------+---------------+---------+-----------+----------+--------------+ CFV      Full           Yes      Yes                                  +---------+---------------+---------+-----------+----------+--------------+ SFJ      Full                                                        +---------+---------------+---------+-----------+----------+--------------+ FV Prox  Full                                                        +---------+---------------+---------+-----------+----------+--------------+ FV Mid   Full                                                        +---------+---------------+---------+-----------+----------+--------------+ FV DistalFull                                                        +---------+---------------+---------+-----------+----------+--------------+ PFV      Full                                                        +---------+---------------+---------+-----------+----------+--------------+ POP      Full           Yes      Yes                                 +---------+---------------+---------+-----------+----------+--------------+ PTV      Full                                                        +---------+---------------+---------+-----------+----------+--------------+ PERO     Full                                                        +---------+---------------+---------+-----------+----------+--------------+     Summary: RIGHT: - No evidence of common femoral vein obstruction.  LEFT: - There is no evidence of deep vein thrombosis in the lower extremity.  *See table(s) above for measurements and observations. Electronically signed by Servando Snare MD on 08/09/2020 at 11:19:19  AM.    Final     Assessment / Plan:  37. 80 year old male admitted to the hospital 3/18 with painless hematochezia with anemia. 2. Anemia. Hg 6.1 -> 7.6. Transfused 4 units of PRBCs on 3/19 -> Hg 7.6 -> 7.1 -> 7.5 -> 7.1 -> Today Hg 7.0. He passed a moderate amount of darker red blood mixed with loose stool x 1 on 3/21 and x 2 yesterday evening.  CTAP 3/07 consistent with  ascending colitis, ischemic vs. Infectious colitis. GI pathogen panel 3/8 positive for rotavirus and enteropathogenic E. coli treated with Ampicillin, Azithromycin, Cefepime, Ceftriaxone, Vancomycin, Metronidazole at various points during his 12-day hospitalization. No N/V or abdominal pain. He remains hemodynamically stable.  -Clear liquid diet for now -Repeat H/H at 1pm -Continue Pantoprazole 40mg  po QD -Consider repeat CTAP vs diagnostic colonoscopy, await further recommendations from Dr. Rush Landmark later today   2. History of atrial fibrillation. Eliquis on hold.   3. AKI on CKD. Cr 3.45 -> 3.02  4. Rhabdomyolysis, resolved.   5.  Hypocalcemia. Ca++ level 7.5.  -Calcium replacement per the hospitalist   7. History of stage I lung cancer s/p radiation. Chest CT 3/7 showed a stable right lower lobe mass with associated right effusion.   8. History of Leukemia s/p bone marrow transplant 1990  9. History of colon polyps. Last colonoscopy with Dr .Ardis Hughs 01/2016 identified 11 polyps from throughout the colon measuring 3 to 10 mm in size. No mention of diverticulosis.Pathology showed 8 tubular adenomas without high-grade dysplasia and a sessile serrated polyp/adenoma. No dysplasia, no malignancy.     Principal Problem:   Acute hypercapnic respiratory failure (HCC) Active Problems:   Chronic atrial fibrillation (HCC)   COPD (chronic obstructive pulmonary disease) (HCC)   Chronic anticoagulation   Solitary pulmonary nodule   SIRS (systemic inflammatory response syndrome) (HCC)   AKI (acute kidney injury) (HCC)   Elevated brain natriuretic peptide (BNP) level   Volume overload   Hypotension   Elevated CK   Abnormal CT scan, colon   Hematochezia     LOS: 15 days   Noralyn Pick  08/10/2020, 9:47 AM

## 2020-08-10 NOTE — Progress Notes (Signed)
PROGRESS NOTE    Todd Lloyd  OFB:[id number] DOB: 23-Jun-1940 DOA: 07/26/2020 PCP: Haywood Pao, MD    Brief Narrative:  This 80 year old male with history of COPD, stage I non-small cell lung cancer s/p radiation in 04/2020, dementia, paroxysmal A. fib on Eliquis, GERD, leukemia s/p bone marrow transplantation in 1990 was admitted to ICU under PCCM service with altered mental status after a fall and diarrhea. He was found to be in shock / respiratory failure requiring BiPAP, acute kidney injury, rhabdomyolysis.  CT of the head and C-spine were negative for acute abnormalities. CT of the abdomen and pelvis showed focal colitis without evidence of perforation. Neurology and nephrology were consulted. He was treated with bicarb drip. EEG was negative for seizures. He was treated with broad-spectrum antibiotics initially; stool was positive for rotavirus and enteropathogenic E. Coli, received Zithromax. He was treated with intravenous fluids initially. Urine output has been minimal and he has been getting intermittent Lasix recently by nephrology. Mental status has improved; Neurology signed off. Patient was transferred to [facility] service on 07/31/2020. GI has been consulted for acute on chronic blood loss anemia.  Patient has had several bowel movement with thick dark blood clots.  Patient has received so far 4 units of PRBCs.  Hemoglobin today 7.0,  seems stable.  Monitor H&H.  May need intervention if He continues have bleeding.  Assessment & Plan:   Principal Problem:   Acute hypercapnic respiratory failure (HCC) Active Problems:   Chronic atrial fibrillation (HCC)   COPD (chronic obstructive pulmonary disease) (HCC)   Chronic anticoagulation   Solitary pulmonary nodule   SIRS (systemic inflammatory response syndrome) (HCC)   AKI (acute kidney injury) (HCC)   Elevated brain natriuretic peptide (BNP) level   Volume overload   Hypotension   Elevated CK   Abnormal CT scan,  colon   Hematochezia   Acute on chronic blood loss anemia, ongoing GI bleed, hypotension  -Patient presented with clots with large BM, Eliquis was placed on hold -Hemoglobin down to 6.2 on 3/19, Has received 4 units PRBC so far. Hb 7.0 today. -Patient has had several BMs with thick dark clots since then. -Continue PPI drip, consulted with Gastroenterology -Unable to get CTA abdomen due to renal dysfunction, creatinine 4.76.   -Currently not stable for tagged RBC scan with hypotension. - Kcentra was given for Eliquis reversal, last Eliquis dose  3/19 - CCM consulted for hemorrhagic shock, BP was in 80s. Now improved,. 08/08/2020: H&H is stable.  No further bleeding reported. 3/21: Hemoglobin 7.5.  No further bleeding noted. GI recommended monitor H&H, continue PPI every 12h,  bleeding has stopped,  notify GI if has further bleeding. 3/22 : May need repeat CT A/P or high risk colonoscopy.  Acute kidney injury: > Improving -Likely secondary from ATN, septic shock and rhabdomyolysis -Initially treated with IV fluids, subsequently with intermittent Lasix.  Nonoliguric -Nephrology following, creatinine appears to be trending down -Creatinine improving, 2.21 today, AKI improving. -CK has trended down. -Renal ultrasound negative for obstruction, continue to hold losartan, avoid nephrotoxins 08/07/2020: AKI is multifactorial.  Full recovery, if possible, will take a long time.  08/08/2020: Slight improvement in renal function is noted.  Continue to monitor closely.  Acute hypoxic and hypercarbic respiratory failure: sec. to acute COPD exacerbation - On 3/17, patient found to be in hypercarbic respiratory failure, lethargic not responding to verbal commands.   - ABG showed pH of 7.2, PCO2 70.1.  Overnight patient had refused BiPAP. - Placed  on BiPAP, alert and oriented this morning - 2D echo showed EF of 55 to 60%.  - Patient needs to have BiPAP QHS and PRN, however had been refusing.  Acute  metabolic encephalopathy superimposed on dementia, myoclonic jerks : -Likely due to hypercarbic respiratory failure, now improved, alert and oriented this morning.   - Patient follows neurology outpatient, Dr. [name].  --CT head negative for acute intracranial pathology -Melatonin discontinued,  avoid sedating meds, BiPAP nightly - Patient had EEG during admission, negative for seizures.  - MRI was initially deferred due to rapid improvement in his mental status. - Continue Namenda, Aricept, fall precautions  Septic shock, present on admission.  Rotavirus and enteropathogenic E. coli diarrhea > Resolved. -Initially treated with aggressive IV fluid hydration, broad-spectrum antibiotics. -Also received empiric antibiotics for meningitis due to altered mental status, DC'd on 07/27/2020. -Blood cultures, urine cultures negative, Covid/flu negative, C. difficile negative -GI PCR was positive for rotavirus/enteropathogenic E. coli, received a dose of Zithromax on 07/28/2020 -Antibiotics have been subsequently discontinued.     History of stage I right lung cancer status post XRT -Outpatient follow-up with oncology  Paroxysmal atrial fibrillation : -Rate controlled, digoxin on hold -Continue to Hold Eliquis  Leukocytosis -Resolved  Thrombocytopenia: Resolved. monitor platelets  GERD -Continue PPI.  Hypomagnesemia: Replacement in progress.   Obesity Estimated body mass index is 29.74 kg/m as calculated from the following:   Height as of this encounter: 6' (1.829 m).   Weight as of this encounter: 99.5 kg.   DVT prophylaxis: SCDs Code Status: DNR Family Communication: No family at bed side, Disposition Plan:   Status is: Inpatient  Remains inpatient appropriate because:Inpatient level of care appropriate due to severity of illness   Dispo: The patient is from: Home              Anticipated d/c is to: SNF              Patient currently is not medically stable to  d/c.   Difficult to place patient No   Consultants:   Gastroenterology  Procedures: 2D Echo  Antimicrobials:     Anti-infectives (From admission, onward)   Start     Dose/Rate Route Frequency Ordered Stop   07/28/20 0845  azithromycin (ZITHROMAX) tablet 1,000 mg        1,000 mg Oral  Once 07/28/20 0747 07/28/20 0827   07/28/20 0608  cefTRIAXone (ROCEPHIN) 2 g in sodium chloride 0.9 % 100 mL IVPB  Status:  Discontinued        2 g 200 mL/hr over 30 Minutes Intravenous Every 24 hours 07/27/20 1447 07/28/20 0747   07/27/20 1045  metroNIDAZOLE (FLAGYL) IVPB 500 mg        500 mg 100 mL/hr over 60 Minutes Intravenous  Once 07/27/20 0956 07/27/20 1113   07/26/20 1930  acyclovir (ZOVIRAX) 775 mg in dextrose 5 % 150 mL IVPB  Status:  Discontinued        10 mg/kg  77.6 kg (Ideal) 165.5 mL/hr over 60 Minutes Intravenous Every 24 hours 07/26/20 1848 07/27/20 1424   07/26/20 1915  ampicillin (OMNIPEN) 2 g in sodium chloride 0.9 % 100 mL IVPB  Status:  Discontinued        2 g 300 mL/hr over 20 Minutes Intravenous Every 8 hours 07/26/20 1848 07/27/20 1424   07/26/20 1900  cefTRIAXone (ROCEPHIN) 2 g in sodium chloride 0.9 % 100 mL IVPB  Status:  Discontinued        2  g 200 mL/hr over 30 Minutes Intravenous Every 12 hours 07/26/20 1846 07/27/20 1447   07/26/20 1851  vancomycin variable dose per unstable renal function (pharmacist dosing)  Status:  Discontinued         Does not apply See admin instructions 07/26/20 1853 07/27/20 1424   07/26/20 1815  ceFEPIme (MAXIPIME) 2 g in sodium chloride 0.9 % 100 mL IVPB  Status:  Discontinued        2 g 200 mL/hr over 30 Minutes Intravenous  Once 07/26/20 1802 07/26/20 1846   07/26/20 1815  metroNIDAZOLE (FLAGYL) IVPB 500 mg        500 mg 100 mL/hr over 60 Minutes Intravenous  Once 07/26/20 1802 07/26/20 2049   07/26/20 1815  vancomycin (VANCOREADY) IVPB 1750 mg/350 mL        1,750 mg 175 mL/hr over 120 Minutes Intravenous  Once 07/26/20 1802 07/26/20  2228     Subjective: Patient was seen and examined at bedside.  He was lying  comfortably with supplemental oxygen via nasal cannula 2 L sats 94%.   Patient does have Foley catheter with significant scrotal swelling.  Patient reports no further bleeding but Hb trending down.  Objective: Vitals:   08/10/20 0409 08/10/20 0745 08/10/20 0800 08/10/20 1204  BP: (!) 157/66  (!) 146/79 (!) 160/73  Pulse:   85 92  Resp:   18 18  Temp: 98.1 F (36.7 C)  97.7 F (36.5 C) 98 F (36.7 C)  TempSrc: Oral  Oral Oral  SpO2:  100% 100% 100%  Weight:      Height:        Intake/Output Summary (Last 24 hours) at 08/10/2020 1400 Last data filed at 08/10/2020 0900 Gross per 24 hour  Intake 960 ml  Output 1700 ml  Net -740 ml   Filed Weights   08/05/20 0531 08/06/20 0516 08/07/20 0426  Weight: 102.3 kg 99 kg 99.5 kg    Examination:  General exam: Appears calm and comfortable, not in any acute distress. Respiratory system: Clear to auscultation. Respiratory effort normal. Cardiovascular system: S1 & S2 heard, RRR. No JVD, murmurs, rubs, gallops or clicks. No pedal edema. Gastrointestinal system: Abdomen is nondistended, soft and nontender. No organomegaly or masses felt. Normal bowel sounds heard. Central nervous system: Alert and oriented. No focal neurological deficits. Extremities: Symmetric 5 x 5 power. No edema, no cyanosis, no clubbing. Skin: No rashes, lesions or ulcers Psychiatry: Judgement and insight appear normal. Mood & affect appropriate.     Data Reviewed: I have personally reviewed following labs and imaging studies  CBC: Recent Labs  Lab 08/06/20 0751 08/06/20 1445 08/07/20 0139 08/07/20 0338 08/08/20 0146 08/09/20 0122 08/09/20 1012 08/10/20 0129 08/10/20 0731  WBC 6.2  --  4.5  --  5.5 4.8  --  4.6  --   HGB 6.2*   < > 6.1*   < > 7.6* 7.1* 7.5* 7.1* 7.0*  HCT 21.1*   < > 19.9*   < > 23.0* 21.9* 24.6* 22.9* 22.2*  MCV 92.1  --  90.0  --  84.9 86.6  --  89.5  --    PLT 239  --  198  --  211 203  --  209  --    < > = values in this interval not displayed.   Basic Metabolic Panel: Recent Labs  Lab 08/04/20 0226 08/05/20 0301 08/05/20 0948 08/06/20 0211 08/07/20 0139 08/08/20 0146 08/09/20 0122 08/10/20 0129  NA 143 144  --  145 144 141 143 143  K 3.9 4.0  --  4.1 3.5 3.4* 3.5 3.5  CL 103 102  --  103 106 101 105 104  CO2 29 30  --  34* 31 31 32 32  GLUCOSE 112* 104*  --  139* 265* 110* 108* 100*  BUN 86* 85*  --  88* 90* 78* 69* 54*  CREATININE 7.36* 5.78*  --  4.76* 4.34* 3.45* 3.02* 2.21*  CALCIUM 7.9* 8.0*  --  8.1* 7.6* 7.5* 7.4* 7.5*  MG  --  1.3* 1.7  --   --   --   --  1.1*  PHOS 6.6* 6.5*  --  6.1* 5.0*  --   --  5.0*   GFR: Estimated Creatinine Clearance: 32.6 mL/min (A) (by C-G formula based on SCr of 2.21 mg/dL (H)). Liver Function Tests: Recent Labs  Lab 08/04/20 0226 08/05/20 0301 08/06/20 0211 08/07/20 0139 08/09/20 0336  AST  --   --   --   --  41  ALT  --   --   --   --  40  ALKPHOS  --   --   --   --  99  BILITOT  --   --   --   --  0.6  PROT  --   --   --   --  4.6*  ALBUMIN 2.8* 3.0* 2.7* 2.0* 2.4*   No results for input(s): LIPASE, AMYLASE in the last 168 hours. No results for input(s): AMMONIA in the last 168 hours. Coagulation Profile: No results for input(s): INR, PROTIME in the last 168 hours. Cardiac Enzymes: No results for input(s): CKTOTAL, CKMB, CKMBINDEX, TROPONINI in the last 168 hours. BNP (last 3 results) No results for input(s): PROBNP in the last 8760 hours. HbA1C: No results for input(s): HGBA1C in the last 72 hours. CBG: Recent Labs  Lab 08/09/20 1621 08/09/20 1924 08/09/20 2252 08/10/20 0403 08/10/20 0801  GLUCAP 151* 157* 93 86 86   Lipid Profile: No results for input(s): CHOL, HDL, LDLCALC, TRIG, CHOLHDL, LDLDIRECT in the last 72 hours. Thyroid Function Tests: No results for input(s): TSH, T4TOTAL, FREET4, T3FREE, THYROIDAB in the last 72 hours. Anemia Panel: No results  for input(s): VITAMINB12, FOLATE, FERRITIN, TIBC, IRON, RETICCTPCT in the last 72 hours. Sepsis Labs: No results for input(s): PROCALCITON, LATICACIDVEN in the last 168 hours.  No results found for this or any previous visit (from the past 240 hour(s)).    Radiology Studies: No results found. Scheduled Meds: . acetaminophen  650 mg Oral TID  . budesonide (PULMICORT) nebulizer solution  0.5 mg Nebulization BID  . chlorhexidine  15 mL Mouth Rinse BID  . Chlorhexidine Gluconate Cloth  6 each Topical Q0600  . donepezil  5 mg Oral QHS  . feeding supplement  1 Container Oral TID WC  . mouth rinse  15 mL Mouth Rinse q12n4p  . memantine  5 mg Oral BID  . pantoprazole  40 mg Oral Daily   Continuous Infusions: . sodium chloride 10 mL/hr at 07/30/20 1800  . lactated ringers 10 mL/hr at 07/30/20 1800     LOS: 15 days    Time spent: 25 mins    Massey Ruhland, MD [facility]   If 7PM-7AM, please contact night-coverage

## 2020-08-10 NOTE — Progress Notes (Signed)
Physical Therapy Treatment Patient Details Name: Todd Lloyd MRN: [id number] DOB: [birth year] Today's Date: 08/10/2020    History of Present Illness 80 yo admitted 3/7 after fall and remained on floor at home 6 hrs with rhabdomyolysis, acute respiratory failure, AMS, AKI. Hospital course complicated by increased CO2/Bipap, hypotension and GI bleed requiring transfusions PMhx; COPD, lung CA, dementia, Afib, leukemia.    PT Comments    Patient able to walk 20 ft, sit and rest x 5 minutes, and then repeat 20 ft with RW and 2L O2. Patient primarily limited by tachypnea with sats >=93% during activity.  Patient is very motivated and requesting PT come more frequently. Discussed limitations in acute care and increased frequency of PT at SNF. Patient remains agreeable with plan.    Follow Up Recommendations  SNF;Supervision/Assistance - 24 hour     Equipment Recommendations  Rolling walker with 5" wheels    Recommendations for Other Services       Precautions / Restrictions Precautions Precautions: Fall Precaution Comments: watch sats    Mobility  Bed Mobility Overal bed mobility: Needs Assistance Bed Mobility: Supine to Sit     Supine to sit: HOB elevated;Supervision     General bed mobility comments: increased time, min guard for lines    Transfers Overall transfer level: Needs assistance Equipment used: Rolling walker (2 wheeled) Transfers: Sit to/from Stand Sit to Stand: Min guard         General transfer comment: increased time, cues for hand placement, min guard for safety  Ambulation/Gait Ambulation/Gait assistance: Min assist;Min guard Gait Distance (Feet): 20 Feet (seated rest; 20 ft) Assistive device: Rolling walker (2 wheeled) Gait Pattern/deviations: Step-through pattern;Decreased stride length;Trunk flexed Gait velocity: decreased   General Gait Details: Pt ambulates with decreased bil step length slowly with trunk flexed. Ambulated on 2L with  sats 93% or higher throughout session   Stairs             Wheelchair Mobility    Modified Rankin (Stroke Patients Only)       Balance Overall balance assessment: Needs assistance;History of Falls   Sitting balance-Leahy Scale: Good Sitting balance - Comments: eOB and BSC without UE support   Standing balance support: Bilateral upper extremity supported;During functional activity Standing balance-Leahy Scale: Poor Standing balance comment: reliant on RW                            Cognition Arousal/Alertness: Awake/alert Behavior During Therapy: WFL for tasks assessed/performed Overall Cognitive Status: No family/caregiver present to determine baseline cognitive functioning                         Following Commands: Follows one step commands consistently Safety/Judgement: Decreased awareness of deficits   Problem Solving: Slow processing;Requires verbal cues;Difficulty sequencing General Comments: Pt slow to process cues, needing repeated cues at times to sequence      Exercises      General Comments        Pertinent Vitals/Pain Pain Assessment: Faces Faces Pain Scale: Hurts little more Pain Location: abdomen (feels distended/consitpated) Pain Descriptors / Indicators: Tightness;Discomfort Pain Intervention(s): Monitored during session;Limited activity within patient's tolerance;Other (comment) (RN aware)    Home Living                      Prior Function            PT Goals (current goals  can now be found in the care plan section) Acute Rehab PT Goals Patient Stated Goal: to improve PT Goal Formulation: With patient Time For Goal Achievement: 08/22/20 Potential to Achieve Goals: Fair Progress towards PT goals: Progressing toward goals    Frequency    Min 2X/week      PT Plan Current plan remains appropriate    Co-evaluation              AM-PAC PT "6 Clicks" Mobility   Outcome Measure  Help needed  turning from your back to your side while in a flat bed without using bedrails?: A Little Help needed moving from lying on your back to sitting on the side of a flat bed without using bedrails?: A Little Help needed moving to and from a bed to a chair (including a wheelchair)?: A Little Help needed standing up from a chair using your arms (e.g., wheelchair or bedside chair)?: A Little Help needed to walk in hospital room?: A Little Help needed climbing 3-5 steps with a railing? : A Lot 6 Click Score: 17    End of Session Equipment Utilized During Treatment: Gait belt;Oxygen Activity Tolerance: Treatment limited secondary to medical complications (Comment) (dyspnea; tachypnea) Patient left: in chair;with call bell/phone within reach;with chair alarm set;with nursing/sitter in room Nurse Communication: Mobility status PT Visit Diagnosis: Other abnormalities of gait and mobility (R26.89);Difficulty in walking, not elsewhere classified (R26.2);Muscle weakness (generalized) (M62.81);Unsteadiness on feet (R26.81)     Time: 1779-3903 PT Time Calculation (min) (ACUTE ONLY): 26 min  Charges:  $Gait Training: 23-37 mins                      [name], PT Pager [phone]    Rexanne Mano 08/10/2020, 11:42 AM

## 2020-08-11 DIAGNOSIS — Z7901 Long term (current) use of anticoagulants: Secondary | ICD-10-CM | POA: Diagnosis not present

## 2020-08-11 DIAGNOSIS — R933 Abnormal findings on diagnostic imaging of other parts of digestive tract: Secondary | ICD-10-CM | POA: Diagnosis not present

## 2020-08-11 DIAGNOSIS — K921 Melena: Secondary | ICD-10-CM | POA: Diagnosis not present

## 2020-08-11 DIAGNOSIS — J9602 Acute respiratory failure with hypercapnia: Secondary | ICD-10-CM | POA: Diagnosis not present

## 2020-08-11 DIAGNOSIS — A09 Infectious gastroenteritis and colitis, unspecified: Secondary | ICD-10-CM | POA: Diagnosis not present

## 2020-08-11 LAB — GLUCOSE, CAPILLARY
Glucose-Capillary: 86 mg/dL (ref 70–99)
Glucose-Capillary: 91 mg/dL (ref 70–99)
Glucose-Capillary: 131 mg/dL — ABNORMAL HIGH (ref 70–99)
Glucose-Capillary: 112 mg/dL — ABNORMAL HIGH (ref 70–99)
Glucose-Capillary: 148 mg/dL — ABNORMAL HIGH (ref 70–99)
Glucose-Capillary: 119 mg/dL — ABNORMAL HIGH (ref 70–99)

## 2020-08-11 LAB — HEMOGLOBIN AND HEMATOCRIT, BLOOD
HCT: 22.8 % — ABNORMAL LOW (ref 39.0–52.0)
Hemoglobin: 7.1 g/dL — ABNORMAL LOW (ref 13.0–17.0)

## 2020-08-11 LAB — BASIC METABOLIC PANEL WITH GFR
Anion gap: 6 (ref 5–15)
BUN: 41 mg/dL — ABNORMAL HIGH (ref 8–23)
CO2: 34 mmol/L — ABNORMAL HIGH (ref 22–32)
Calcium: 7.7 mg/dL — ABNORMAL LOW (ref 8.9–10.3)
Chloride: 101 mmol/L (ref 98–111)
Creatinine, Ser: 1.77 mg/dL — ABNORMAL HIGH (ref 0.61–1.24)
GFR, Estimated: 38 mL/min — ABNORMAL LOW
Glucose, Bld: 100 mg/dL — ABNORMAL HIGH (ref 70–99)
Potassium: 3.3 mmol/L — ABNORMAL LOW (ref 3.5–5.1)
Sodium: 141 mmol/L (ref 135–145)

## 2020-08-11 LAB — PHOSPHORUS: Phosphorus: 4.6 mg/dL (ref 2.5–4.6)

## 2020-08-11 LAB — MAGNESIUM: Magnesium: 1.2 mg/dL — ABNORMAL LOW (ref 1.7–2.4)

## 2020-08-11 MED ORDER — MAGNESIUM SULFATE 2 GM/50ML IV SOLN
2.0000 g | Freq: Once | INTRAVENOUS | Status: AC
  Administered 2020-08-11: 2 g via INTRAVENOUS
  Filled 2020-08-11: qty 50

## 2020-08-11 MED ORDER — POTASSIUM CHLORIDE 20 MEQ PO PACK
40.0000 meq | PACK | Freq: Once | ORAL | Status: AC
  Administered 2020-08-11: 40 meq via ORAL
  Filled 2020-08-11: qty 2

## 2020-08-11 NOTE — Progress Notes (Signed)
Occupational Therapy Treatment Patient Details Name: Todd Lloyd MRN: [id number] DOB: 1940-11-12 Today's Date: 08/11/2020    History of present illness 80 yo admitted 3/7 after fall and remained on floor at home 6 hrs with rhabdomyolysis, acute respiratory failure, AMS, AKI. Hospital course complicated by increased CO2/Bipap, hypotension and GI bleed requiring transfusions PMhx; COPD, lung CA, dementia, Afib, leukemia.   OT comments  Pt progressing to standing for OOB ADL tasks and performing BUE HEP. Pt limited by decreased strength, decreased ability care for self and decreased endurance. pt with SOB, but SpO2 >95% on 2L O2 throughout session- O2 kept on due to chest tightness with PT on RA earlier today. Pt would benefit from continued OT skilled services. OT following acutely. Pt aware of SNF d/c as he wants to be stronger to take care of his spouse.    Follow Up Recommendations  SNF;Supervision/Assistance - 24 hour    Equipment Recommendations  3 in 1 bedside commode    Recommendations for Other Services      Precautions / Restrictions Precautions Precautions: Fall Precaution Comments: watch sats Restrictions Weight Bearing Restrictions: No       Mobility Bed Mobility Overal bed mobility: Needs Assistance Bed Mobility: Supine to Sit     Supine to sit: HOB elevated;Supervision Sit to supine: Supervision   General bed mobility comments: in recliner pre and post session    Transfers Overall transfer level: Needs assistance Equipment used: Rolling walker (2 wheeled) Transfers: Sit to/from Stand Sit to Stand: Min guard         General transfer comment: pt placing hands in correct places    Balance Overall balance assessment: Needs assistance   Sitting balance-Leahy Scale: Good Sitting balance - Comments: eOB and BSC without UE support   Standing balance support: Bilateral upper extremity supported;During functional activity Standing balance-Leahy  Scale: Poor Standing balance comment: reliant on RW                           ADL either performed or assessed with clinical judgement   ADL Overall ADL's : Needs assistance/impaired     Grooming: Supervision/safety;Standing;Brushing hair;Wash/dry hands;Wash/dry face Grooming Details (indicate cue type and reason): supervisionA; using counter to hold onto             Lower Body Dressing: Min guard;Sitting/lateral leans Lower Body Dressing Details (indicate cue type and reason): donning socks by bending  over             Functional mobility during ADLs: Min guard;Minimal assistance;Rolling walker;Cueing for safety ([name] for LOB episode) General ADL Comments: Pt limited by decreased strength, decreased ability care for self and decreased endurance. pt with SOB,, but SpO2 >95% on 2L O2 throughout session- O2 kept on due to chest tightness with PT on RA earlier today.     Vision   Additional Comments: wears glasses, had 1 episode of not seeing a gray trash can, but pt reports that he may have been moving too fast.   Perception     Praxis      Cognition Arousal/Alertness: Awake/alert Behavior During Therapy: WFL for tasks assessed/performed Overall Cognitive Status: No family/caregiver present to determine baseline cognitive functioning Area of Impairment: Following commands;Safety/judgement;Problem solving                       Following Commands: Follows one step commands consistently Safety/Judgement: Decreased awareness of deficits   Problem Solving: Slow  processing;Requires verbal cues;Difficulty sequencing General Comments: Pt not seeing a trash can and bumping into it on R side with RW and a LOB episode requiring assist to right reaction. Otherwise, pt very alert and following all commands.        Exercises Exercises: General Upper Extremity General Exercises - Upper Extremity Shoulder Flexion: AROM;Both;20 reps;Seated Shoulder Horizontal  ABduction: AROM;Both;20 reps;Seated Elbow Flexion: Both;20 reps;Seated Elbow Extension: AROM;Both;20 reps;Seated   Shoulder Instructions       General Comments O2 >90% on 2L O2. HR 90s.    Pertinent Vitals/ Pain       Pain Assessment: 0-10 Pain Score: 5  Pain Location: chest tightness with walking Pain Descriptors / Indicators: Tightness;Discomfort Pain Intervention(s): Limited activity within patient's tolerance;Monitored during session  Home Living                                          Prior Functioning/Environment              Frequency  Min 2X/week        Progress Toward Goals  OT Goals(current goals can now be found in the care plan section)  Progress towards OT goals: Progressing toward goals  Acute Rehab OT Goals Patient Stated Goal: to improve OT Goal Formulation: With patient Time For Goal Achievement: 08/22/20 Potential to Achieve Goals: Good ADL Goals Pt Will Perform Grooming: with supervision;standing Pt Will Perform Lower Body Bathing: with supervision;sit to/from stand;with adaptive equipment Pt Will Perform Lower Body Dressing: with supervision;with adaptive equipment;sit to/from stand Pt Will Transfer to Toilet: with supervision;ambulating Pt Will Perform Toileting - Clothing Manipulation and hygiene: with supervision;sit to/from stand  Plan Discharge plan remains appropriate    Co-evaluation                 AM-PAC OT "6 Clicks" Daily Activity     Outcome Measure   Help from another person eating meals?: None Help from another person taking care of personal grooming?: A Little Help from another person toileting, which includes using toliet, bedpan, or urinal?: A Lot Help from another person bathing (including washing, rinsing, drying)?: A Lot Help from another person to put on and taking off regular upper body clothing?: A Little Help from another person to put on and taking off regular lower body clothing?: A  Little 6 Click Score: 17    End of Session Equipment Utilized During Treatment: Gait belt;Rolling walker  OT Visit Diagnosis: Unsteadiness on feet (R26.81);Muscle weakness (generalized) (M62.81);Other symptoms and signs involving cognitive function;Pain   Activity Tolerance Patient tolerated treatment well   Patient Left in chair;with call bell/phone within reach;with chair alarm set   Nurse Communication Mobility status        Time: 1600-1630 OT Time Calculation (min): 30 min  Charges: OT General Charges $OT Visit: 1 Visit OT Treatments $Self Care/Home Management : 8-22 mins $Therapeutic Exercise: 8-22 mins  Jefferey Pica, OTR/L Acute Rehabilitation Services Pager: [phone] Office: [phone]    Tyrell Seifer  C 08/11/2020, 5:16 PM

## 2020-08-11 NOTE — Progress Notes (Signed)
Daily Rounding Note  08/11/2020, 9:31 AM  LOS: 16 days   SUBJECTIVE:   Chief complaint: Hematochezia, colitis.       Small stool this AM was not bloody.  Pt is hungry, did not get clear liquids as ordered.  OBJECTIVE:         Vital signs in last 24 hours:    Temp:  [97.7 F (36.5 C)-98 F (36.7 C)] 97.8 F (36.6 C) (03/23 0713) Pulse Rate:  [69-92] 82 (03/23 0827) Resp:  [15-21] 21 (03/23 0827) BP: (138-165)/(63-79) 156/79 (03/23 0713) SpO2:  [92 %-100 %] 92 % (03/23 0827) FiO2 (%):  [50 %] 50 % (03/22 2315) Weight:  [224.9 kg] 224.9 kg (03/23 0539) Last BM Date: 08/10/20 Filed Weights   08/06/20 0516 08/07/20 0426 08/11/20 0539  Weight: 99 kg 99.5 kg (!) 224.9 kg   General: looks chronically ill.  comfortable   Heart: Irreg, Irreg.  Tele w A fib, HR 93.   Chest: no labored breathing or cough Abdomen: soft, active BS, ND.  Focal tenderness w/o guard or rebound in R abdomen.    Extremities: no CCE Neuro/Psych:  Appropriate.  Alert.  Pleasant, calm.  Fluid speech.    Intake/Output from previous day: 03/22 0701 - 03/23 0700 In: 64 [P.O.:480; I.V.:10] Out: 2150 [Urine:2150]  Intake/Output this shift: No intake/output data recorded.  Lab Results: Recent Labs    08/09/20 0122 08/09/20 1012 08/10/20 0129 08/10/20 0731 08/10/20 1304 08/11/20 0306  WBC 4.8  --  4.6  --   --   --   HGB 7.1*   < > 7.1* 7.0* 7.5* 7.1*  HCT 21.9*   < > 22.9* 22.2* 24.7* 22.8*  PLT 203  --  209  --   --   --    < > = values in this interval not displayed.   BMET Recent Labs    08/09/20 0122 08/10/20 0129 08/11/20 0306  NA 143 143 141  K 3.5 3.5 3.3*  CL 105 104 101  CO2 32 32 34*  GLUCOSE 108* 100* 100*  BUN 69* 54* 41*  CREATININE 3.02* 2.21* 1.77*  CALCIUM 7.4* 7.5* 7.7*   LFT Recent Labs    08/09/20 0336  PROT 4.6*  ALBUMIN 2.4*  AST 41  ALT 40  ALKPHOS 99  BILITOT 0.6  BILIDIR <0.1  IBILI NOT  CALCULATED   PT/INR No results for input(s): LABPROT, INR in the last 72 hours. Hepatitis Panel No results for input(s): HEPBSAG, HCVAB, HEPAIGM, HEPBIGM in the last 72 hours.  Studies/Results: CT ABDOMEN PELVIS WO CONTRAST  Result Date: 08/10/2020 CLINICAL DATA:  Anemia, colitis. EXAM: CT ABDOMEN AND PELVIS WITHOUT CONTRAST TECHNIQUE: Multidetector CT imaging of the abdomen and pelvis was performed following the standard protocol without IV contrast. COMPARISON:  July 26, 2020. FINDINGS: Lower chest: Bilateral pleural effusions are noted with adjacent subsegmental atelectasis. Hepatobiliary: No focal liver abnormality is seen. No gallstones, gallbladder wall thickening, or biliary dilatation. Pancreas: Unremarkable. No pancreatic ductal dilatation or surrounding inflammatory changes. Spleen: Normal in size without focal abnormality. Adrenals/Urinary Tract: Stable large calcification involving left adrenal gland consistent with old hematoma. Right adrenal gland appears normal. Small nonobstructive left renal calculus is noted. No hydronephrosis or renal obstruction is noted. Urinary bladder is decompressed secondary to Foley catheter. Stomach/Bowel: The stomach appears normal. There is no evidence of bowel obstruction. Status post appendectomy. There remains wall thickening involving the proximal os ascending colon, but  it appears to be significantly improved compared to prior exam. Vascular/Lymphatic: Aortic atherosclerosis. No enlarged abdominal or pelvic lymph nodes. Reproductive: Prostate is unremarkable. Other: Mild anasarca is noted.  No ascites or hernia is noted. Musculoskeletal: No acute or significant osseous findings. IMPRESSION: 1. Bilateral pleural effusions are noted with adjacent subsegmental atelectasis. 2. Stable large calcification involving left adrenal gland consistent with old hematoma. 3. Small nonobstructive left renal calculus. No hydronephrosis or renal obstruction is noted. 4.  There remains wall thickening involving the proximal ascending colon, but it appears to be significantly improved compared to prior exam. 5. Mild anasarca is noted. 6. Aortic atherosclerosis. Aortic Atherosclerosis (ICD10-I70.0). Electronically Signed   By: Marijo Conception M.D.   On: 08/10/2020 19:25    Scheduled Meds: . acetaminophen  650 mg Oral TID  . budesonide (PULMICORT) nebulizer solution  0.5 mg Nebulization BID  . calcium carbonate  1 tablet Oral Q breakfast  . chlorhexidine  15 mL Mouth Rinse BID  . Chlorhexidine Gluconate Cloth  6 each Topical Q0600  . donepezil  5 mg Oral QHS  . feeding supplement  1 Container Oral TID WC  . mouth rinse  15 mL Mouth Rinse q12n4p  . memantine  5 mg Oral BID  . pantoprazole  40 mg Oral Daily   Continuous Infusions: . sodium chloride 10 mL/hr at 08/10/20 1600  . lactated ringers 10 mL/hr at 07/30/20 1800   PRN Meds:.albuterol, Gerhardt's butt cream, loperamide, polyethylene glycol   ASSESMENT:   *   Right sided colitis.  Hematochezia. Initial admission tested positive for enteropathogenic E. coli and rotavirus.  Treated w multiple abx.   08/10/2020 CTAP shows improved but persistent wall thickening of prox ascending colon.  *   Chronic Eliquis for hx A. fib, on hold.  *    Rhabdomyolysis, resolved.  *    AKI on CKD.  *    Lung cancer treated with radiation.  Stable RLL mass with associated effusion on 07/26/2020 chest CT.  *   Multiple colon polyps, TAs/SSAs on colonoscopy 01/2016.   PLAN   *   Per Dr Rush Landmark.    *   Advance to [facility] diet.    *   ? Timing to resume Eliquis?    Azucena Freed  08/11/2020, 9:31 AM Phone [phone]

## 2020-08-11 NOTE — Progress Notes (Signed)
PROGRESS NOTE    Todd Lloyd  YSA:[id number] DOB: [birth year] DOA: 07/26/2020 PCP: Haywood Pao, MD    Brief Narrative:  This 80 year old male with history of COPD, stage I non-small cell lung cancer s/p radiation in 04/2020, dementia, paroxysmal A. fib on Eliquis, GERD, leukemia s/p bone marrow transplantation in 1990 was admitted to ICU under PCCM service with altered mental status after a fall and diarrhea. He was found to be in shock / respiratory failure requiring BiPAP, acute kidney injury, rhabdomyolysis.  CT of the head and C-spine were negative for acute abnormalities. CT of the abdomen and pelvis showed focal colitis without evidence of perforation. Neurology and nephrology were consulted. He was treated with bicarb drip. EEG was negative for seizures. He was treated with broad-spectrum antibiotics initially; stool was positive for rotavirus and enteropathogenic E. Coli, received Zithromax. He was treated with intravenous fluids initially. Urine output has been minimal and he has been getting intermittent Lasix recently by nephrology. Mental status has improved; Neurology signed off. Patient was transferred to [facility] service on 07/31/2020. GI has been consulted for acute on chronic blood loss anemia.  Patient has had several bowel movement with thick dark blood clots.  Patient has received so far 4 units of PRBCs.  Hemoglobin today 7.1,  seems stable.  Monitor H&H.  May need intervention if He continues have bleeding.  Assessment & Plan:   Principal Problem:   Acute hypercapnic respiratory failure (HCC) Active Problems:   Chronic atrial fibrillation (HCC)   COPD (chronic obstructive pulmonary disease) (HCC)   Chronic anticoagulation   Solitary pulmonary nodule   SIRS (systemic inflammatory response syndrome) (HCC)   AKI (acute kidney injury) (HCC)   Elevated brain natriuretic peptide (BNP) level   Volume overload   Hypotension   Elevated CK   Abnormal CT scan,  colon   Hematochezia   Acute on chronic blood loss anemia, ongoing GI bleed, hypotension  -Patient presented with clots with large BM, Eliquis was placed on hold -Hemoglobin down to 6.2 on 3/19, Has received 4 units PRBC so far. Hb 7.0 today. -Patient has had several BMs with thick dark clots since then. -Continue PPI drip, consulted with Gastroenterology -Unable to get CTA abdomen due to renal dysfunction, creatinine 4.76.   -Currently not stable for tagged RBC scan with hypotension. - Kcentra was given for Eliquis reversal, last Eliquis dose  3/19 - CCM consulted for hemorrhagic shock, BP was in 80s. Now improved,. 08/08/2020: H&H is stable.  No further bleeding reported. 3/21: Hemoglobin 7.5.  No further bleeding noted. GI recommended monitor H&H, continue PPI every 12h,  bleeding has stopped,  notify GI if has further bleeding. 3/22 : May need repeat CT A/P or high risk colonoscopy.  Repeat CT abdomen shows thickening in the descending colon.    Acute kidney injury: > Improving -Likely secondary from ATN, septic shock and rhabdomyolysis -Initially treated with IV fluids, subsequently with intermittent Lasix.  Nonoliguric -Nephrology following, creatinine appears to be trending down -Creatinine improving, 1.77 today, AKI improving. -CK has trended down. -Renal ultrasound negative for obstruction, continue to hold losartan, avoid nephrotoxins 08/08/2020: Slight improvement in renal function is noted.  Continue to monitor closely.  Acute hypoxic and hypercarbic respiratory failure: sec. to acute COPD exacerbation - On 3/17, patient found to be in hypercarbic respiratory failure, lethargic not responding to verbal commands.   - ABG showed pH of 7.2, PCO2 70.1.  Overnight patient had refused BiPAP. - Placed on BiPAP, alert  and oriented this morning - 2D echo showed EF of 55 to 60%.  - Patient needs to have BiPAP QHS and PRN, however had been refusing.  Acute metabolic  encephalopathy superimposed on dementia, myoclonic jerks : -Likely due to hypercarbic respiratory failure, now improved, alert and oriented this morning.   - Patient follows neurology outpatient, Dr. [name].  --CT head negative for acute intracranial pathology -Melatonin discontinued,  avoid sedating meds, BiPAP nightly - Patient had EEG during admission, negative for seizures.  - MRI was initially deferred due to rapid improvement in his mental status. - Continue Namenda, Aricept, fall precautions  Septic shock, present on admission.  Rotavirus and enteropathogenic E. coli diarrhea > Resolved. -Initially treated with aggressive IV fluid hydration, broad-spectrum antibiotics. -Also received empiric antibiotics for meningitis due to altered mental status, DC'd on 07/27/2020. -Blood cultures, urine cultures negative, Covid/flu negative, C. difficile negative -GI PCR was positive for rotavirus/enteropathogenic E. coli, received a dose of Zithromax on 07/28/2020 -Antibiotics have been subsequently discontinued.     History of stage I right lung cancer status post XRT -Outpatient follow-up with oncology  Paroxysmal atrial fibrillation : -Rate controlled, digoxin on hold -Continue to Hold Eliquis  Leukocytosis -Resolved  Thrombocytopenia: Resolved. monitor platelets  GERD -Continue PPI.  Hypomagnesemia: Replacement in progress.   Obesity Estimated body mass index is 29.74 kg/m as calculated from the following:   Height as of this encounter: 6' (1.829 m).   Weight as of this encounter: 99.5 kg.   DVT prophylaxis: SCDs Code Status: DNR Family Communication: No family at bed side, Disposition Plan:   Status is: Inpatient  Remains inpatient appropriate because:Inpatient level of care appropriate due to severity of illness   Dispo: The patient is from: Home              Anticipated d/c is to: SNF              Patient currently is not medically stable to d/c.    Difficult to place patient No   Consultants:   Gastroenterology  Procedures: 2D Echo  Antimicrobials:     Anti-infectives (From admission, onward)   Start     Dose/Rate Route Frequency Ordered Stop   07/28/20 0845  azithromycin (ZITHROMAX) tablet 1,000 mg        1,000 mg Oral  Once 07/28/20 0747 07/28/20 0827   07/28/20 0608  cefTRIAXone (ROCEPHIN) 2 g in sodium chloride 0.9 % 100 mL IVPB  Status:  Discontinued        2 g 200 mL/hr over 30 Minutes Intravenous Every 24 hours 07/27/20 1447 07/28/20 0747   07/27/20 1045  metroNIDAZOLE (FLAGYL) IVPB 500 mg        500 mg 100 mL/hr over 60 Minutes Intravenous  Once 07/27/20 0956 07/27/20 1113   07/26/20 1930  acyclovir (ZOVIRAX) 775 mg in dextrose 5 % 150 mL IVPB  Status:  Discontinued        10 mg/kg  77.6 kg (Ideal) 165.5 mL/hr over 60 Minutes Intravenous Every 24 hours 07/26/20 1848 07/27/20 1424   07/26/20 1915  ampicillin (OMNIPEN) 2 g in sodium chloride 0.9 % 100 mL IVPB  Status:  Discontinued        2 g 300 mL/hr over 20 Minutes Intravenous Every 8 hours 07/26/20 1848 07/27/20 1424   07/26/20 1900  cefTRIAXone (ROCEPHIN) 2 g in sodium chloride 0.9 % 100 mL IVPB  Status:  Discontinued        2 g 200 mL/hr  over 30 Minutes Intravenous Every 12 hours 07/26/20 1846 07/27/20 1447   07/26/20 1851  vancomycin variable dose per unstable renal function (pharmacist dosing)  Status:  Discontinued         Does not apply See admin instructions 07/26/20 1853 07/27/20 1424   07/26/20 1815  ceFEPIme (MAXIPIME) 2 g in sodium chloride 0.9 % 100 mL IVPB  Status:  Discontinued        2 g 200 mL/hr over 30 Minutes Intravenous  Once 07/26/20 1802 07/26/20 1846   07/26/20 1815  metroNIDAZOLE (FLAGYL) IVPB 500 mg        500 mg 100 mL/hr over 60 Minutes Intravenous  Once 07/26/20 1802 07/26/20 2049   07/26/20 1815  vancomycin (VANCOREADY) IVPB 1750 mg/350 mL        1,750 mg 175 mL/hr over 120 Minutes Intravenous  Once 07/26/20 1802 07/26/20 2228      Subjective: Patient was seen and examined at bedside.  He was lying comfortably with supplemental oxygen via nasal cannula 2 L sats 94%.   Patient does have Foley catheter with significant scrotal swelling.  Patient reports no further bleeding but Hb trending down.   Objective: Vitals:   08/11/20 0446 08/11/20 0539 08/11/20 0713 08/11/20 0827  BP: 138/63  (!) 156/79   Pulse: 69   82  Resp: 15  (!) 21 (!) 21  Temp: 97.7 F (36.5 C)  97.8 F (36.6 C)   TempSrc: Axillary  Axillary   SpO2: 100%  98% 92%  Weight:  (!) 224.9 kg    Height:        Intake/Output Summary (Last 24 hours) at 08/11/2020 1347 Last data filed at 08/11/2020 0942 Gross per 24 hour  Intake 532.94 ml  Output 3750 ml  Net -3217.06 ml   Filed Weights   08/06/20 0516 08/07/20 0426 08/11/20 0539  Weight: 99 kg 99.5 kg (!) 224.9 kg    Examination:  General exam: Appears calm and comfortable, not in any acute distress. Respiratory system: Clear to auscultation. Respiratory effort normal. Cardiovascular system: S1 & S2 heard, RRR. No JVD, murmurs, rubs, gallops or clicks. No pedal edema. Gastrointestinal system: Abdomen is nondistended, soft and nontender. No organomegaly or masses felt. Normal bowel sounds heard. Central nervous system: Alert and oriented. No focal neurological deficits. Extremities: Symmetric 5 x 5 power. No edema, no cyanosis, no clubbing. Skin: No rashes, lesions or ulcers Psychiatry: Judgement and insight appear normal. Mood & affect appropriate.     Data Reviewed: I have personally reviewed following labs and imaging studies  CBC: Recent Labs  Lab 08/06/20 0751 08/06/20 1445 08/07/20 0139 08/07/20 0338 08/08/20 0146 08/09/20 0122 08/09/20 1012 08/10/20 0129 08/10/20 0731 08/10/20 1304 08/11/20 0306  WBC 6.2  --  4.5  --  5.5 4.8  --  4.6  --   --   --   HGB 6.2*   < > 6.1*   < > 7.6* 7.1* 7.5* 7.1* 7.0* 7.5* 7.1*  HCT 21.1*   < > 19.9*   < > 23.0* 21.9* 24.6* 22.9* 22.2*  24.7* 22.8*  MCV 92.1  --  90.0  --  84.9 86.6  --  89.5  --   --   --   PLT 239  --  198  --  211 203  --  209  --   --   --    < > = values in this interval not displayed.   Basic Metabolic Panel: Recent Labs  Lab 08/05/20 0301 08/05/20 0948 08/06/20 0211 08/07/20 0139 08/08/20 0146 08/09/20 0122 08/10/20 0129 08/11/20 0306  NA 144  --  145 144 141 143 143 141  K 4.0  --  4.1 3.5 3.4* 3.5 3.5 3.3*  CL 102  --  103 106 101 105 104 101  CO2 30  --  34* 31 31 32 32 34*  GLUCOSE 104*  --  139* 265* 110* 108* 100* 100*  BUN 85*  --  88* 90* 78* 69* 54* 41*  CREATININE 5.78*  --  4.76* 4.34* 3.45* 3.02* 2.21* 1.77*  CALCIUM 8.0*  --  8.1* 7.6* 7.5* 7.4* 7.5* 7.7*  MG 1.3* 1.7  --   --   --   --  1.1* 1.2*  PHOS 6.5*  --  6.1* 5.0*  --   --  5.0* 4.6   GFR: Estimated Creatinine Clearance: 64.3 mL/min (A) (by C-G formula based on SCr of 1.77 mg/dL (H)). Liver Function Tests: Recent Labs  Lab 08/05/20 0301 08/06/20 0211 08/07/20 0139 08/09/20 0336  AST  --   --   --  41  ALT  --   --   --  40  ALKPHOS  --   --   --  99  BILITOT  --   --   --  0.6  PROT  --   --   --  4.6*  ALBUMIN 3.0* 2.7* 2.0* 2.4*   No results for input(s): LIPASE, AMYLASE in the last 168 hours. No results for input(s): AMMONIA in the last 168 hours. Coagulation Profile: No results for input(s): INR, PROTIME in the last 168 hours. Cardiac Enzymes: No results for input(s): CKTOTAL, CKMB, CKMBINDEX, TROPONINI in the last 168 hours. BNP (last 3 results) No results for input(s): PROBNP in the last 8760 hours. HbA1C: No results for input(s): HGBA1C in the last 72 hours. CBG: Recent Labs  Lab 08/10/20 1919 08/10/20 2256 08/11/20 0453 08/11/20 0725 08/11/20 1143  GLUCAP 93 91 86 91 131*   Lipid Profile: No results for input(s): CHOL, HDL, LDLCALC, TRIG, CHOLHDL, LDLDIRECT in the last 72 hours. Thyroid Function Tests: No results for input(s): TSH, T4TOTAL, FREET4, T3FREE, THYROIDAB in the last 72  hours. Anemia Panel: No results for input(s): VITAMINB12, FOLATE, FERRITIN, TIBC, IRON, RETICCTPCT in the last 72 hours. Sepsis Labs: No results for input(s): PROCALCITON, LATICACIDVEN in the last 168 hours.  No results found for this or any previous visit (from the past 240 hour(s)).    Radiology Studies: CT ABDOMEN PELVIS WO CONTRAST  Result Date: 08/10/2020 CLINICAL DATA:  Anemia, colitis. EXAM: CT ABDOMEN AND PELVIS WITHOUT CONTRAST TECHNIQUE: Multidetector CT imaging of the abdomen and pelvis was performed following the standard protocol without IV contrast. COMPARISON:  July 26, 2020. FINDINGS: Lower chest: Bilateral pleural effusions are noted with adjacent subsegmental atelectasis. Hepatobiliary: No focal liver abnormality is seen. No gallstones, gallbladder wall thickening, or biliary dilatation. Pancreas: Unremarkable. No pancreatic ductal dilatation or surrounding inflammatory changes. Spleen: Normal in size without focal abnormality. Adrenals/Urinary Tract: Stable large calcification involving left adrenal gland consistent with old hematoma. Right adrenal gland appears normal. Small nonobstructive left renal calculus is noted. No hydronephrosis or renal obstruction is noted. Urinary bladder is decompressed secondary to Foley catheter. Stomach/Bowel: The stomach appears normal. There is no evidence of bowel obstruction. Status post appendectomy. There remains wall thickening involving the proximal os ascending colon, but it appears to be significantly improved compared to prior exam. Vascular/Lymphatic: Aortic atherosclerosis. No  enlarged abdominal or pelvic lymph nodes. Reproductive: Prostate is unremarkable. Other: Mild anasarca is noted.  No ascites or hernia is noted. Musculoskeletal: No acute or significant osseous findings. IMPRESSION: 1. Bilateral pleural effusions are noted with adjacent subsegmental atelectasis. 2. Stable large calcification involving left adrenal gland consistent  with old hematoma. 3. Small nonobstructive left renal calculus. No hydronephrosis or renal obstruction is noted. 4. There remains wall thickening involving the proximal ascending colon, but it appears to be significantly improved compared to prior exam. 5. Mild anasarca is noted. 6. Aortic atherosclerosis. Aortic Atherosclerosis (ICD10-I70.0). Electronically Signed   By: Marijo Conception M.D.   On: 08/10/2020 19:25   Scheduled Meds: . acetaminophen  650 mg Oral TID  . budesonide (PULMICORT) nebulizer solution  0.5 mg Nebulization BID  . calcium carbonate  1 tablet Oral Q breakfast  . chlorhexidine  15 mL Mouth Rinse BID  . Chlorhexidine Gluconate Cloth  6 each Topical Q0600  . donepezil  5 mg Oral QHS  . feeding supplement  1 Container Oral TID WC  . mouth rinse  15 mL Mouth Rinse q12n4p  . memantine  5 mg Oral BID  . pantoprazole  40 mg Oral Daily   Continuous Infusions: . sodium chloride 10 mL/hr at 08/10/20 1600  . lactated ringers 10 mL/hr at 07/30/20 1800     LOS: 16 days    Time spent: 25 mins    Cavin Longman, MD [facility]   If 7PM-7AM, please contact night-coverage

## 2020-08-11 NOTE — Progress Notes (Signed)
Physical Therapy Treatment Patient Details Name: Todd Lloyd MRN: [id number] DOB: 04/09/1941 Today's Date: 08/11/2020    History of Present Illness 80 yo admitted 3/7 after fall and remained on floor at home 6 hrs with rhabdomyolysis, acute respiratory failure, AMS, AKI. Hospital course complicated by increased CO2/Bipap, hypotension and GI bleed requiring transfusions PMhx; COPD, lung CA, dementia, Afib, leukemia.    PT Comments    Patient on room air at rest with sats 96%. During ambulation, sats decr to 91% and upon return to sitting further decreased to 87%. Patient cued for pursed lip breathing with good return demonstration and sats incr to 90% in <2 minutes. Sats 91% with pt needing to return to [facility] and sats again dropped to 87% with slower recovery (~5 minutes--with pt talking despite cues not to talk). Pt reporting a feeling on tightness in his chest and 2L O2 applied and RN made aware. Educated on use of IS and pt only able to pull 250 ml. Educated to perform at least 10 breaths/hour.     Follow Up Recommendations  SNF;Supervision/Assistance - 24 hour     Equipment Recommendations  Rolling walker with 5" wheels    Recommendations for Other Services OT consult     Precautions / Restrictions Precautions Precautions: Fall Precaution Comments: watch sats    Mobility  Bed Mobility Overal bed mobility: Needs Assistance Bed Mobility: Supine to Sit     Supine to sit: HOB elevated;Supervision Sit to supine: Supervision   General bed mobility comments: increased time, supervision for lines    Transfers Overall transfer level: Needs assistance Equipment used: Rolling walker (2 wheeled) Transfers: Sit to/from Stand Sit to Stand: Min guard         General transfer comment: increased time, cues for hand placement each time, min guard for safety; 3 reps  Ambulation/Gait Ambulation/Gait assistance: Min assist Gait Distance (Feet): 30 Feet Assistive device:  Rolling walker (2 wheeled) Gait Pattern/deviations: Step-through pattern;Decreased stride length;Trunk flexed Gait velocity: decreased   General Gait Details: On room air with sats decr to 87% and pt reporting tightness in his chest. RN informed and O2 applied   Stairs             Wheelchair Mobility    Modified Rankin (Stroke Patients Only)       Balance Overall balance assessment: Needs assistance;History of Falls   Sitting balance-Leahy Scale: Good Sitting balance - Comments: eOB and BSC without UE support   Standing balance support: Bilateral upper extremity supported;During functional activity Standing balance-Leahy Scale: Poor Standing balance comment: reliant on RW                            Cognition Arousal/Alertness: Awake/alert Behavior During Therapy: WFL for tasks assessed/performed Overall Cognitive Status: No family/caregiver present to determine baseline cognitive functioning Area of Impairment: Following commands;Safety/judgement;Problem solving                       Following Commands: Follows one step commands consistently Safety/Judgement: Decreased awareness of deficits   Problem Solving: Slow processing;Requires verbal cues;Difficulty sequencing General Comments: Pt slow to process cues, needing repeated cues at times to sequence      Exercises      General Comments        Pertinent Vitals/Pain Pain Assessment: 0-10 Pain Score: 5  Pain Location: chest tightness with walking Pain Descriptors / Indicators: Tightness;Discomfort Pain Intervention(s): Limited activity within patient's  tolerance;Monitored during session;Repositioned;Other (comment) (2L O2 reapplied and RN made aware)    Home Living                      Prior Function            PT Goals (current goals can now be found in the care plan section) Acute Rehab PT Goals Patient Stated Goal: to improve PT Goal Formulation: With patient Time  For Goal Achievement: 08/22/20 Potential to Achieve Goals: Fair Progress towards PT goals: Progressing toward goals    Frequency    Min 2X/week      PT Plan Current plan remains appropriate    Co-evaluation              AM-PAC PT "6 Clicks" Mobility   Outcome Measure  Help needed turning from your back to your side while in a flat bed without using bedrails?: A Little Help needed moving from lying on your back to sitting on the side of a flat bed without using bedrails?: A Little Help needed moving to and from a bed to a chair (including a wheelchair)?: A Little Help needed standing up from a chair using your arms (e.g., wheelchair or bedside chair)?: A Little Help needed to walk in hospital room?: A Little Help needed climbing 3-5 steps with a railing? : A Lot 6 Click Score: 17    End of Session Equipment Utilized During Treatment: Oxygen Activity Tolerance: Treatment limited secondary to medical complications (Comment) (dyspnea;chest tightness) Patient left: in chair;with call bell/phone within reach;with chair alarm set Nurse Communication: Mobility status;Other (comment) (decr sats; chest tightness; placed on 2L O2) PT Visit Diagnosis: Other abnormalities of gait and mobility (R26.89);Difficulty in walking, not elsewhere classified (R26.2);Muscle weakness (generalized) (M62.81);Unsteadiness on feet (R26.81)     Time: 3785-8850 PT Time Calculation (min) (ACUTE ONLY): 30 min  Charges:  $Gait Training: 8-22 mins $Self Care/Home Management: 8-22                      [name], PT Pager [phone]    Rexanne Mano 08/11/2020, 3:11 PM

## 2020-08-12 DIAGNOSIS — A040 Enteropathogenic Escherichia coli infection: Secondary | ICD-10-CM | POA: Diagnosis present

## 2020-08-12 DIAGNOSIS — J9602 Acute respiratory failure with hypercapnia: Secondary | ICD-10-CM | POA: Diagnosis not present

## 2020-08-12 LAB — BASIC METABOLIC PANEL
Anion gap: 4 — ABNORMAL LOW (ref 5–15)
BUN: 32 mg/dL — ABNORMAL HIGH (ref 8–23)
CO2: 35 mmol/L — ABNORMAL HIGH (ref 22–32)
Calcium: 8.1 mg/dL — ABNORMAL LOW (ref 8.9–10.3)
Chloride: 104 mmol/L (ref 98–111)
Creatinine, Ser: 1.56 mg/dL — ABNORMAL HIGH (ref 0.61–1.24)
GFR, Estimated: 45 mL/min — ABNORMAL LOW (ref >60)
Glucose, Bld: 96 mg/dL (ref 70–99)
Potassium: 3.9 mmol/L (ref 3.5–5.1)
Sodium: 143 mmol/L (ref 135–145)

## 2020-08-12 LAB — GLUCOSE, CAPILLARY
Glucose-Capillary: 102 mg/dL — ABNORMAL HIGH (ref 70–99)
Glucose-Capillary: 82 mg/dL (ref 70–99)
Glucose-Capillary: 137 mg/dL — ABNORMAL HIGH (ref 70–99)
Glucose-Capillary: 103 mg/dL — ABNORMAL HIGH (ref 70–99)
Glucose-Capillary: 129 mg/dL — ABNORMAL HIGH (ref 70–99)
Glucose-Capillary: 95 mg/dL (ref 70–99)

## 2020-08-12 LAB — HEMOGLOBIN AND HEMATOCRIT, BLOOD
HCT: 22.9 % — ABNORMAL LOW (ref 39.0–52.0)
Hemoglobin: 7 g/dL — ABNORMAL LOW (ref 13.0–17.0)

## 2020-08-12 LAB — TROPONIN I (HIGH SENSITIVITY)
Troponin I (High Sensitivity): 19 ng/L — ABNORMAL HIGH (ref <18)
Troponin I (High Sensitivity): 21 ng/L — ABNORMAL HIGH (ref <18)

## 2020-08-12 LAB — MAGNESIUM: Magnesium: 1.3 mg/dL — ABNORMAL LOW (ref 1.7–2.4)

## 2020-08-12 LAB — PHOSPHORUS: Phosphorus: 4 mg/dL (ref 2.5–4.6)

## 2020-08-12 MED ORDER — FUROSEMIDE 10 MG/ML IJ SOLN
40.0000 mg | Freq: Once | INTRAMUSCULAR | Status: AC
  Administered 2020-08-12: 40 mg via INTRAVENOUS
  Filled 2020-08-12: qty 4

## 2020-08-12 MED ORDER — GUAIFENESIN 100 MG/5ML PO SOLN
5.0000 mL | Freq: Four times a day (QID) | ORAL | Status: DC | PRN
  Administered 2020-08-12 – 2020-08-14 (×2): 100 mg via ORAL
  Filled 2020-08-12 (×5): qty 5

## 2020-08-12 MED ORDER — NITROGLYCERIN 0.4 MG SL SUBL: 0.4000 mg | SUBLINGUAL_TABLET | SUBLINGUAL | Status: DC | PRN

## 2020-08-12 MED ORDER — DM-GUAIFENESIN ER 30-600 MG PO TB12
1.0000 | ORAL_TABLET | Freq: Two times a day (BID) | ORAL | Status: DC
  Administered 2020-08-12 – 2020-08-20 (×16): 1 via ORAL
  Filled 2020-08-12 (×16): qty 1

## 2020-08-12 MED ORDER — FAMOTIDINE IN NACL 20-0.9 MG/50ML-% IV SOLN
20.0000 mg | Freq: Once | INTRAVENOUS | Status: AC
  Administered 2020-08-12: 20 mg via INTRAVENOUS
  Filled 2020-08-12: qty 50

## 2020-08-12 MED ORDER — MAGNESIUM SULFATE 2 GM/50ML IV SOLN
2.0000 g | Freq: Once | INTRAVENOUS | Status: AC
  Administered 2020-08-12: 2 g via INTRAVENOUS
  Filled 2020-08-12: qty 50

## 2020-08-12 MED ORDER — NITROGLYCERIN 0.4 MG SL SUBL
SUBLINGUAL_TABLET | SUBLINGUAL | Status: AC
  Administered 2020-08-12: 0.4 mg
  Filled 2020-08-12: qty 1

## 2020-08-12 NOTE — Plan of Care (Signed)

## 2020-08-12 NOTE — Progress Notes (Signed)
Pt refused bipap for tonight 

## 2020-08-12 NOTE — Progress Notes (Signed)
PROGRESS NOTE    Todd Lloyd  KZS:[id number] DOB: [birth year] DOA: 07/26/2020 PCP: Haywood Pao, MD    Brief Narrative:  This 80 year old male with history of COPD, stage I non-small cell lung cancer s/p radiation in 04/2020, dementia, paroxysmal A. fib on Eliquis, GERD, leukemia s/p bone marrow transplantation in 1990 was admitted to ICU under PCCM service with altered mental status after a fall and diarrhea. He was found to be in shock / respiratory failure requiring BiPAP, acute kidney injury, rhabdomyolysis.  CT of the head and C-spine were negative for acute abnormalities. CT of the abdomen and pelvis showed focal colitis without evidence of perforation. Neurology and nephrology were consulted. He was treated with bicarb drip. EEG was negative for seizures. He was treated with broad-spectrum antibiotics initially; stool was positive for rotavirus and enteropathogenic E. Coli, received Zithromax. He was treated with intravenous fluids initially. Urine output has been minimal and he has been getting intermittent Lasix recently by nephrology. Mental status has improved; Neurology signed off. Patient was transferred to [facility] service on 07/31/2020. GI has been consulted for acute on chronic blood loss anemia.  Patient has had several bowel movement with thick dark blood clots.  Patient has received so far 4 units of PRBCs.  Hemoglobin today 7.0,  seems stable.  Monitor H&H.  May need intervention if He continues have bleeding.  Assessment & Plan:   Principal Problem:   Acute hypercapnic respiratory failure (HCC) Active Problems:   Chronic atrial fibrillation (HCC)   COPD (chronic obstructive pulmonary disease) (HCC)   Chronic anticoagulation   Solitary pulmonary nodule   SIRS (systemic inflammatory response syndrome) (HCC)   AKI (acute kidney injury) (HCC)   Elevated brain natriuretic peptide (BNP) level   Volume overload   Hypotension   Elevated CK   Abnormal CT scan,  colon   Hematochezia   Acute on chronic blood loss anemia, ongoing GI bleed, hypotension  -Patient presented with clots with large BM, Eliquis was placed on hold -Hemoglobin down to 6.2 on 3/19, Has received 4 units PRBC so far. Hb 7.0 today. -Patient has had several BMs with thick dark clots since then. -Continue PPI drip, consulted with Gastroenterology -Unable to get CTA abdomen due to renal dysfunction, creatinine 4.76.   -Currently not stable for tagged RBC scan with hypotension. - Kcentra was given for Eliquis reversal, last Eliquis dose  3/19 - CCM consulted for hemorrhagic shock, BP was in 80s. Now improved,. 08/08/2020: H&H is stable.  No further bleeding reported. 3/21: Hemoglobin 7.5.  No further bleeding noted. GI recommended monitor H&H, continue PPI every 12h,  bleeding has stopped,  notify GI if has further bleeding. 3/22 : May need repeat CT A/P or high risk colonoscopy.  Repeat CT abdomen shows thickening in the descending colon. 3/23: Hemoglobin remained stable.  GI recommended resuming heparin without bolus on 3/ 25, transition to Eliquis on 3/26, if hemoglobin remains stable, consider discharge planning.  High risk colonoscopy if he bleeds.  Acute kidney injury: > Improving -Likely secondary from ATN, septic shock and rhabdomyolysis -Initially treated with IV fluids, subsequently with intermittent Lasix.  Nonoliguric -Nephrology following, creatinine appears to be trending down -Creatinine improving, 1.56 today, AKI improving. -CK has trended down. -Renal ultrasound negative for obstruction, continue to hold losartan, avoid nephrotoxins 08/08/2020: Slight improvement in renal function is noted.  Continue to monitor closely.  Acute hypoxic and hypercarbic respiratory failure: sec. to acute COPD exacerbation - On 3/17, patient found to be  in hypercarbic respiratory failure, lethargic not responding to verbal commands.   - ABG showed pH of 7.2, PCO2 70.1.  Overnight  patient had refused BiPAP. - Placed on BiPAP, alert and oriented this morning - 2D echo showed EF of 55 to 60%.  - Patient needs to have BiPAP QHS and PRN, however had been refusing.  Acute metabolic encephalopathy superimposed on dementia, myoclonic jerks :> Resolved. -Likely due to hypercarbic respiratory failure, now improved, alert and oriented this morning.   - Patient follows neurology outpatient, Dr. [name].  --CT head negative for acute intracranial pathology -Melatonin discontinued,  avoid sedating meds, BiPAP nightly - Patient had EEG during admission, negative for seizures.  - MRI was initially deferred due to rapid improvement in his mental status. - Continue Namenda, Aricept, fall precautions  Septic shock, present on admission.  Rotavirus and enteropathogenic E. coli diarrhea > Resolved. -Initially treated with aggressive IV fluid hydration, broad-spectrum antibiotics. -Also received empiric antibiotics for meningitis due to altered mental status, DC'd on 07/27/2020. -Blood cultures, urine cultures negative, Covid/flu negative, C. difficile negative -GI PCR was positive for rotavirus/enteropathogenic E. coli, received a dose of Zithromax on 07/28/2020 -Antibiotics have been subsequently discontinued.     History of stage I right lung cancer status post XRT -Outpatient follow-up with oncology  Paroxysmal atrial fibrillation : -Rate controlled, digoxin on hold -Continue to Hold Eliquis. -Plan to resume heparin on 3/25  Leukocytosis -Resolved  Thrombocytopenia: Resolved. monitor platelets  GERD -Continue PPI.  Hypomagnesemia: Replacement in progress.   Obesity Estimated body mass index is 29.74 kg/m as calculated from the following:   Height as of this encounter: 6' (1.829 m).   Weight as of this encounter: 99.5 kg.   DVT prophylaxis: SCDs Code Status: DNR Family Communication: No family at bed side, Disposition Plan:   Status is:  Inpatient  Remains inpatient appropriate because:Inpatient level of care appropriate due to severity of illness   Dispo: The patient is from: Home              Anticipated d/c is to: SNF              Patient currently is not medically stable to d/c.   Difficult to place patient No   Consultants:   Gastroenterology  Procedures: 2D Echo  Antimicrobials:     Anti-infectives (From admission, onward)   Start     Dose/Rate Route Frequency Ordered Stop   07/28/20 0845  azithromycin (ZITHROMAX) tablet 1,000 mg        1,000 mg Oral  Once 07/28/20 0747 07/28/20 0827   07/28/20 0608  cefTRIAXone (ROCEPHIN) 2 g in sodium chloride 0.9 % 100 mL IVPB  Status:  Discontinued        2 g 200 mL/hr over 30 Minutes Intravenous Every 24 hours 07/27/20 1447 07/28/20 0747   07/27/20 1045  metroNIDAZOLE (FLAGYL) IVPB 500 mg        500 mg 100 mL/hr over 60 Minutes Intravenous  Once 07/27/20 0956 07/27/20 1113   07/26/20 1930  acyclovir (ZOVIRAX) 775 mg in dextrose 5 % 150 mL IVPB  Status:  Discontinued        10 mg/kg  77.6 kg (Ideal) 165.5 mL/hr over 60 Minutes Intravenous Every 24 hours 07/26/20 1848 07/27/20 1424   07/26/20 1915  ampicillin (OMNIPEN) 2 g in sodium chloride 0.9 % 100 mL IVPB  Status:  Discontinued        2 g 300 mL/hr over 20 Minutes Intravenous Every  8 hours 07/26/20 1848 07/27/20 1424   07/26/20 1900  cefTRIAXone (ROCEPHIN) 2 g in sodium chloride 0.9 % 100 mL IVPB  Status:  Discontinued        2 g 200 mL/hr over 30 Minutes Intravenous Every 12 hours 07/26/20 1846 07/27/20 1447   07/26/20 1851  vancomycin variable dose per unstable renal function (pharmacist dosing)  Status:  Discontinued         Does not apply See admin instructions 07/26/20 1853 07/27/20 1424   07/26/20 1815  ceFEPIme (MAXIPIME) 2 g in sodium chloride 0.9 % 100 mL IVPB  Status:  Discontinued        2 g 200 mL/hr over 30 Minutes Intravenous  Once 07/26/20 1802 07/26/20 1846   07/26/20 1815  metroNIDAZOLE  (FLAGYL) IVPB 500 mg        500 mg 100 mL/hr over 60 Minutes Intravenous  Once 07/26/20 1802 07/26/20 2049   07/26/20 1815  vancomycin (VANCOREADY) IVPB 1750 mg/350 mL        1,750 mg 175 mL/hr over 120 Minutes Intravenous  Once 07/26/20 1802 07/26/20 2228     Subjective: Patient was seen and examined at bedside.  Overnight events noted. Patient reports  abdominal discomfort, given Pepcid, nitroglycerin and Lasix which improved the discomfort. EKG, unchanged.  He was lying comfortably with supplemental oxygen via nasal cannula 2 L sats 94% after Lasix was given. Patient reports no further bleeding but Hb trending down.   Objective: Vitals:   08/11/20 2315 08/12/20 0348 08/12/20 0723 08/12/20 1151  BP:  135/65 125/62 (!) 148/79  Pulse: 88 85 84 (!) 102  Resp: 18 18 18 20   Temp:  98.2 F (36.8 C) 98.7 F (37.1 C) 98.7 F (37.1 C)  TempSrc:  Axillary Oral   SpO2: 100% 100% 92% 94%  Weight:      Height:        Intake/Output Summary (Last 24 hours) at 08/12/2020 1314 Last data filed at 08/12/2020 1102 Gross per 24 hour  Intake --  Output 2400 ml  Net -2400 ml   Filed Weights   08/06/20 0516 08/07/20 0426 08/11/20 0539  Weight: 99 kg 99.5 kg (!) 224.9 kg    Examination:  General exam: Appears calm and comfortable, not in any acute distress. Respiratory system: Clear to auscultation. Respiratory effort normal. Cardiovascular system: S1 & S2 heard, RRR. No JVD, murmurs, rubs, gallops or clicks. No pedal edema. Gastrointestinal system: Abdomen is nondistended, soft and nontender. No organomegaly or masses felt. Normal bowel sounds heard. Central nervous system: Alert and oriented. No focal neurological deficits. Extremities: Symmetric 5 x 5 power. No edema, no cyanosis, no clubbing. Skin: No rashes, lesions or ulcers Psychiatry: Judgement and insight appear normal. Mood & affect appropriate.     Data Reviewed: I have personally reviewed following labs and imaging  studies  CBC: Recent Labs  Lab 08/06/20 0751 08/06/20 1445 08/07/20 0139 08/07/20 0338 08/08/20 0146 08/09/20 0122 08/09/20 1012 08/10/20 0129 08/10/20 0731 08/10/20 1304 08/11/20 0306 08/12/20 0347  WBC 6.2  --  4.5  --  5.5 4.8  --  4.6  --   --   --   --   HGB 6.2*   < > 6.1*   < > 7.6* 7.1*   < > 7.1* 7.0* 7.5* 7.1* 7.0*  HCT 21.1*   < > 19.9*   < > 23.0* 21.9*   < > 22.9* 22.2* 24.7* 22.8* 22.9*  MCV 92.1  --  90.0  --  84.9 86.6  --  89.5  --   --   --   --   PLT 239  --  198  --  211 203  --  209  --   --   --   --    < > = values in this interval not displayed.   Basic Metabolic Panel: Recent Labs  Lab 08/06/20 0211 08/07/20 0139 08/08/20 0146 08/09/20 0122 08/10/20 0129 08/11/20 0306 08/12/20 0347  NA 145 144 141 143 143 141 143  K 4.1 3.5 3.4* 3.5 3.5 3.3* 3.9  CL 103 106 101 105 104 101 104  CO2 34* 31 31 32 32 34* 35*  GLUCOSE 139* 265* 110* 108* 100* 100* 96  BUN 88* 90* 78* 69* 54* 41* 32*  CREATININE 4.76* 4.34* 3.45* 3.02* 2.21* 1.77* 1.56*  CALCIUM 8.1* 7.6* 7.5* 7.4* 7.5* 7.7* 8.1*  MG  --   --   --   --  1.1* 1.2* 1.3*  PHOS 6.1* 5.0*  --   --  5.0* 4.6 4.0   GFR: Estimated Creatinine Clearance: 72.9 mL/min (A) (by C-G formula based on SCr of 1.56 mg/dL (H)). Liver Function Tests: Recent Labs  Lab 08/06/20 0211 08/07/20 0139 08/09/20 0336  AST  --   --  41  ALT  --   --  40  ALKPHOS  --   --  99  BILITOT  --   --  0.6  PROT  --   --  4.6*  ALBUMIN 2.7* 2.0* 2.4*   No results for input(s): LIPASE, AMYLASE in the last 168 hours. No results for input(s): AMMONIA in the last 168 hours. Coagulation Profile: No results for input(s): INR, PROTIME in the last 168 hours. Cardiac Enzymes: No results for input(s): CKTOTAL, CKMB, CKMBINDEX, TROPONINI in the last 168 hours. BNP (last 3 results) No results for input(s): PROBNP in the last 8760 hours. HbA1C: No results for input(s): HGBA1C in the last 72 hours. CBG: Recent Labs  Lab  08/11/20 1939 08/11/20 2259 08/12/20 0344 08/12/20 0718 08/12/20 1149  GLUCAP 148* 119* 102* 82 137*   Lipid Profile: No results for input(s): CHOL, HDL, LDLCALC, TRIG, CHOLHDL, LDLDIRECT in the last 72 hours. Thyroid Function Tests: No results for input(s): TSH, T4TOTAL, FREET4, T3FREE, THYROIDAB in the last 72 hours. Anemia Panel: No results for input(s): VITAMINB12, FOLATE, FERRITIN, TIBC, IRON, RETICCTPCT in the last 72 hours. Sepsis Labs: No results for input(s): PROCALCITON, LATICACIDVEN in the last 168 hours.  No results found for this or any previous visit (from the past 240 hour(s)).    Radiology Studies: CT ABDOMEN PELVIS WO CONTRAST  Result Date: 08/10/2020 CLINICAL DATA:  Anemia, colitis. EXAM: CT ABDOMEN AND PELVIS WITHOUT CONTRAST TECHNIQUE: Multidetector CT imaging of the abdomen and pelvis was performed following the standard protocol without IV contrast. COMPARISON:  July 26, 2020. FINDINGS: Lower chest: Bilateral pleural effusions are noted with adjacent subsegmental atelectasis. Hepatobiliary: No focal liver abnormality is seen. No gallstones, gallbladder wall thickening, or biliary dilatation. Pancreas: Unremarkable. No pancreatic ductal dilatation or surrounding inflammatory changes. Spleen: Normal in size without focal abnormality. Adrenals/Urinary Tract: Stable large calcification involving left adrenal gland consistent with old hematoma. Right adrenal gland appears normal. Small nonobstructive left renal calculus is noted. No hydronephrosis or renal obstruction is noted. Urinary bladder is decompressed secondary to Foley catheter. Stomach/Bowel: The stomach appears normal. There is no evidence of bowel obstruction. Status post appendectomy. There remains wall thickening involving the proximal os  ascending colon, but it appears to be significantly improved compared to prior exam. Vascular/Lymphatic: Aortic atherosclerosis. No enlarged abdominal or pelvic lymph nodes.  Reproductive: Prostate is unremarkable. Other: Mild anasarca is noted.  No ascites or hernia is noted. Musculoskeletal: No acute or significant osseous findings. IMPRESSION: 1. Bilateral pleural effusions are noted with adjacent subsegmental atelectasis. 2. Stable large calcification involving left adrenal gland consistent with old hematoma. 3. Small nonobstructive left renal calculus. No hydronephrosis or renal obstruction is noted. 4. There remains wall thickening involving the proximal ascending colon, but it appears to be significantly improved compared to prior exam. 5. Mild anasarca is noted. 6. Aortic atherosclerosis. Aortic Atherosclerosis (ICD10-I70.0). Electronically Signed   By: Marijo Conception M.D.   On: 08/10/2020 19:25   Scheduled Meds: . acetaminophen  650 mg Oral TID  . budesonide (PULMICORT) nebulizer solution  0.5 mg Nebulization BID  . calcium carbonate  1 tablet Oral Q breakfast  . chlorhexidine  15 mL Mouth Rinse BID  . Chlorhexidine Gluconate Cloth  6 each Topical Q0600  . donepezil  5 mg Oral QHS  . feeding supplement  1 Container Oral TID WC  . mouth rinse  15 mL Mouth Rinse q12n4p  . memantine  5 mg Oral BID  . pantoprazole  40 mg Oral Daily   Continuous Infusions: . sodium chloride 10 mL/hr at 08/10/20 1600  . lactated ringers 10 mL/hr at 07/30/20 1800     LOS: 17 days    Time spent: 25 mins    Herberto Ledwell, MD [facility]   If 7PM-7AM, please contact night-coverage

## 2020-08-12 NOTE — Progress Notes (Addendum)
          Daily Rounding Note  08/12/2020, 10:54 AM  LOS: 17 days   SUBJECTIVE:   Chief complaint:   Hematochezia, R sided colitis.  BM yest and this AM are brown, soft, formed.  No abd pain.  Tolerating solid HH diet  OBJECTIVE:         Vital signs in last 24 hours:    Temp:  [98.2 F (36.8 C)-98.8 F (37.1 C)] 98.7 F (37.1 C) (03/24 0723) Pulse Rate:  [79-99] 84 (03/24 0723) Resp:  [18-21] 18 (03/24 0723) BP: (122-147)/(62-74) 125/62 (03/24 0723) SpO2:  [92 %-100 %] 92 % (03/24 0723) FiO2 (%):  [60 %] 60 % (03/23 2300) Last BM Date: 08/11/20 Filed Weights   08/06/20 0516 08/07/20 0426 08/11/20 0539  Weight: 99 kg 99.5 kg (!) 224.9 kg   General: looks chronically ill and frail.  Alert, vomfortable   Heart: RRR Chest: no labored breathing or cough Abdomen: Large.  not palpated as he was sitting on BS commode  Extremities: + LE edema Neuro/Psych:  Appropriate.  Alert.  Calm.  No tremors.     Lab Results: Recent Labs    08/10/20 0129 08/10/20 0731 08/10/20 1304 08/11/20 0306 08/12/20 0347  WBC 4.6  --   --   --   --   HGB 7.1*   < > 7.5* 7.1* 7.0*  HCT 22.9*   < > 24.7* 22.8* 22.9*  PLT 209  --   --   --   --    < > = values in this interval not displayed.   BMET Recent Labs    08/10/20 0129 08/11/20 0306 08/12/20 0347  NA 143 141 143  K 3.5 3.3* 3.9  CL 104 101 104  CO2 32 34* 35*  GLUCOSE 100* 100* 96  BUN 54* 41* 32*  CREATININE 2.21* 1.77* 1.56*  CALCIUM 7.5* 7.7* 8.1*       ASSESMENT:   *    Right sided colitis.  Hematochezia. Initial admission tested positive for enteropathogenic E. coli and rotavirus.  Treated w multiple abx.   08/10/2020 CTAP shows improved but persistent wall thickening of prox ascending colon. Multiple colon polyps,TAs/SSAs on 01/2016 colonoscopy.  *   Chronic Eliquis for A. fib.  On hold.  *    rhabdomyolysis, resolved.  *    AKI on CKD.  *    lung cancer,  treated with radiation.  Stable RLL mass and associated pleural effusion on chest CT 07/26/2020   PLAN   *   Start with IV heparin w/o bolus tomorrow 3/25.  If no recurrent hematochezia restart Eliquis on Sat 3/26 or Sunday 3/27. GI will move to standby, call or secure chat if GI re-involvement needed.     Todd Lloyd  08/12/2020, 10:54 AM Phone [phone]  [place] attending  I have also seen and evaluated the patient.  He is clinically much better.  We will sign off plans as above.  He does not need outpatient GI follow-up after discharge.

## 2020-08-12 NOTE — Progress Notes (Signed)
Physical Therapy Treatment Patient Details Name: Todd Lloyd MRN: [id number] DOB: May 08, 1941 Today's Date: 08/12/2020    History of Present Illness 80 yo admitted 3/7 after fall and remained on floor at home 6 hrs with rhabdomyolysis, acute respiratory failure, AMS, AKI. Hospital course complicated by increased CO2/Bipap, hypotension and GI bleed requiring transfusions PMhx; COPD, lung CA, dementia, Afib, leukemia.    PT Comments    Pt pleasant and progressing well with mobility this session. Pt on RA on arrival with SPO2 40% with application of 2L able to achieve 94% and ranged from 88-94% on 2L with gait with cues for posture and breathing technique. Pt with incontinent stool end of session and transition to [facility] with max assist for pericare.     Follow Up Recommendations  SNF;Supervision/Assistance - 24 hour     Equipment Recommendations  Rolling walker with 5" wheels    Recommendations for Other Services       Precautions / Restrictions Precautions Precautions: Fall    Mobility  Bed Mobility Overal bed mobility: Needs Assistance Bed Mobility: Supine to Sit     Supine to sit: Supervision;HOB elevated     General bed mobility comments: HOB 25 degrees with pt able to transition to EOB without assist    Transfers Overall transfer level: Needs assistance   Transfers: Sit to/from Stand;Stand Pivot Transfers Sit to Stand: Min guard Stand pivot transfers: Min guard       General transfer comment: cues for hand placement to stand from bed, recliner and BSC. Pivot recliner <>BSC with RW with guarding for lines and safety  Ambulation/Gait Ambulation/Gait assistance: Min assist Gait Distance (Feet): 140 Feet Assistive device: Rolling walker (2 wheeled) Gait Pattern/deviations: Step-through pattern;Decreased stride length;Trunk flexed   Gait velocity interpretation: 1.31 - 2.62 ft/sec, indicative of limited community ambulator General Gait Details: pt with SpO2  88-94% on 2L with gait with cues for posture and direction with pt self limiting distance , HR 100   Stairs             Wheelchair Mobility    Modified Rankin (Stroke Patients Only)       Balance Overall balance assessment: Needs assistance   Sitting balance-Leahy Scale: Good Sitting balance - Comments: eOB and BSC without UE support   Standing balance support: Bilateral upper extremity supported;During functional activity Standing balance-Leahy Scale: Poor Standing balance comment: reliant on RW                            Cognition Arousal/Alertness: Awake/alert Behavior During Therapy: WFL for tasks assessed/performed Overall Cognitive Status: No family/caregiver present to determine baseline cognitive functioning Area of Impairment: Following commands;Safety/judgement;Problem solving                     Memory: Decreased short-term memory;Decreased recall of precautions Following Commands: Follows one step commands consistently Safety/Judgement: Decreased awareness of deficits   Problem Solving: Slow processing;Requires verbal cues General Comments: pt responding appropriately to commands with lack of awareness of need for BM until already going      Exercises      General Comments        Pertinent Vitals/Pain Pain Assessment: No/denies pain    Home Living                      Prior Function            PT Goals (current goals  can now be found in the care plan section) Progress towards PT goals: Progressing toward goals    Frequency    Min 2X/week      PT Plan Current plan remains appropriate    Co-evaluation              AM-PAC PT "6 Clicks" Mobility   Outcome Measure  Help needed turning from your back to your side while in a flat bed without using bedrails?: A Little Help needed moving from lying on your back to sitting on the side of a flat bed without using bedrails?: A Little Help needed moving  to and from a bed to a chair (including a wheelchair)?: A Little Help needed standing up from a chair using your arms (e.g., wheelchair or bedside chair)?: A Little Help needed to walk in hospital room?: A Little Help needed climbing 3-5 steps with a railing? : A Lot 6 Click Score: 17    End of Session Equipment Utilized During Treatment: Oxygen;Gait belt Activity Tolerance: Patient tolerated treatment well Patient left: in chair;with call bell/phone within reach;with chair alarm set Nurse Communication: Mobility status PT Visit Diagnosis: Other abnormalities of gait and mobility (R26.89);Difficulty in walking, not elsewhere classified (R26.2);Muscle weakness (generalized) (M62.81);Unsteadiness on feet (R26.81)     Time: 1771-1657 PT Time Calculation (min) (ACUTE ONLY): 30 min  Charges:  $Gait Training: 8-22 mins $Therapeutic Activity: 8-22 mins                     Khaila Velarde P, PT Acute Rehabilitation Services Pager: [phone] Office: [place] 08/12/2020, 1:35 PM

## 2020-08-13 ENCOUNTER — Other Ambulatory Visit (HOSPITAL_COMMUNITY): Payer: Medicare Other

## 2020-08-13 DIAGNOSIS — J9602 Acute respiratory failure with hypercapnia: Secondary | ICD-10-CM | POA: Diagnosis not present

## 2020-08-13 LAB — MAGNESIUM: Magnesium: 1.3 mg/dL — ABNORMAL LOW (ref 1.7–2.4)

## 2020-08-13 LAB — GLUCOSE, CAPILLARY
Glucose-Capillary: 102 mg/dL — ABNORMAL HIGH (ref 70–99)
Glucose-Capillary: 88 mg/dL (ref 70–99)
Glucose-Capillary: 137 mg/dL — ABNORMAL HIGH (ref 70–99)
Glucose-Capillary: 103 mg/dL — ABNORMAL HIGH (ref 70–99)

## 2020-08-13 LAB — BASIC METABOLIC PANEL
Anion gap: 6 (ref 5–15)
BUN: 25 mg/dL — ABNORMAL HIGH (ref 8–23)
CO2: 35 mmol/L — ABNORMAL HIGH (ref 22–32)
Calcium: 8.3 mg/dL — ABNORMAL LOW (ref 8.9–10.3)
Chloride: 101 mmol/L (ref 98–111)
Creatinine, Ser: 1.44 mg/dL — ABNORMAL HIGH (ref 0.61–1.24)
GFR, Estimated: 49 mL/min — ABNORMAL LOW (ref >60)
Glucose, Bld: 97 mg/dL (ref 70–99)
Potassium: 4.2 mmol/L (ref 3.5–5.1)
Sodium: 142 mmol/L (ref 135–145)

## 2020-08-13 LAB — HEMOGLOBIN AND HEMATOCRIT, BLOOD
HCT: 23.9 % — ABNORMAL LOW (ref 39.0–52.0)
Hemoglobin: 7.5 g/dL — ABNORMAL LOW (ref 13.0–17.0)

## 2020-08-13 LAB — HEPARIN LEVEL (UNFRACTIONATED): Heparin Unfractionated: 0.14 IU/mL — ABNORMAL LOW (ref 0.30–0.70)

## 2020-08-13 LAB — PHOSPHORUS: Phosphorus: 3 mg/dL (ref 2.5–4.6)

## 2020-08-13 MED ORDER — HEPARIN (PORCINE) 25000 UT/250ML-% IV SOLN
1350.0000 [IU]/h | INTRAVENOUS | Status: DC
  Administered 2020-08-13: 1150 [IU]/h via INTRAVENOUS
  Filled 2020-08-13 (×2): qty 250

## 2020-08-13 MED ORDER — FUROSEMIDE 10 MG/ML IJ SOLN
40.0000 mg | Freq: Once | INTRAMUSCULAR | Status: AC
  Administered 2020-08-13: 40 mg via INTRAVENOUS
  Filled 2020-08-13: qty 4

## 2020-08-13 MED ORDER — FUROSEMIDE 10 MG/ML IJ SOLN
40.0000 mg | Freq: Every day | INTRAMUSCULAR | Status: DC
  Administered 2020-08-14 – 2020-08-19 (×6): 40 mg via INTRAVENOUS
  Filled 2020-08-13 (×7): qty 4

## 2020-08-13 MED ORDER — MAGNESIUM SULFATE 2 GM/50ML IV SOLN
2.0000 g | Freq: Once | INTRAVENOUS | Status: AC
  Administered 2020-08-13: 2 g via INTRAVENOUS
  Filled 2020-08-13: qty 50

## 2020-08-13 MED ORDER — FAMOTIDINE IN NACL 20-0.9 MG/50ML-% IV SOLN
20.0000 mg | Freq: Once | INTRAVENOUS | Status: AC
  Administered 2020-08-13: 20 mg via INTRAVENOUS
  Filled 2020-08-13: qty 50

## 2020-08-13 MED ORDER — DICYCLOMINE HCL 10 MG PO CAPS
10.0000 mg | ORAL_CAPSULE | Freq: Three times a day (TID) | ORAL | Status: DC
  Administered 2020-08-13 – 2020-08-16 (×8): 10 mg via ORAL
  Filled 2020-08-13 (×9): qty 1

## 2020-08-13 NOTE — Progress Notes (Signed)
Occupational Therapy Treatment Patient Details Name: Todd Lloyd MRN: [id number] DOB: 1940-09-07 Today's Date: 08/13/2020    History of present illness 80 yo admitted 3/7 after fall and remained on floor at home 6 hrs with rhabdomyolysis, acute respiratory failure, AMS, AKI. Hospital course complicated by increased CO2/Bipap, hypotension and GI bleed requiring transfusions PMhx; COPD, lung CA, dementia, Afib, leukemia.   OT comments  Pt. Seen for skilled OT treatment.  Initial encouragement for participation then willing to do more once up.  Completed bed mobility, and transfer to recliner.  Moving well once up.  Initiates rest breaks appropriately when stated that he was feeling "dizzy or light headed".    BP: Supine to sit: 148/102-117 Sit/stand/transfer: 174/94-49   Follow Up Recommendations  SNF;Supervision/Assistance - 24 hour    Equipment Recommendations  3 in 1 bedside commode    Recommendations for Other Services      Precautions / Restrictions Precautions Precautions: Fall Precaution Comments: watch sats Restrictions Weight Bearing Restrictions: No       Mobility Bed Mobility Overal bed mobility: Needs Assistance Bed Mobility: Rolling Rolling: Min guard   Supine to sit: Supervision;HOB elevated          Transfers Overall transfer level: Needs assistance Equipment used: Rolling walker (2 wheeled) Transfers: Sit to/from [facility] Sit to Stand: Min guard Stand pivot transfers: Min guard       General transfer comment: cues for hand placement to stand from bed, recliner and BSC. Pivot recliner <>BSC with RW with guarding for lines and safety    Balance                                           ADL either performed or assessed with clinical judgement   ADL Overall ADL's : Needs assistance/impaired                         Toilet Transfer: Ambulation;RW;Min Psychiatric nurse Details (indicate  cue type and reason): simulated with transfer from eob to recliner         Functional mobility during ADLs: Min guard;Minimal assistance;Rolling walker;Cueing for safety General ADL Comments: pt. on 2l nasal canula.  initial flucuation of BP with positional changes.  rn made aware.     Vision       Perception     Praxis      Cognition Arousal/Alertness: Awake/alert Behavior During Therapy: WFL for tasks assessed/performed Overall Cognitive Status: No family/caregiver present to determine baseline cognitive functioning Area of Impairment: Following commands;Safety/judgement;Problem solving                               General Comments: pt. states feeling "down and helpless"  with current status.  eager to go home, feeling nervous about therapy needs prior to home.  provided reassurance and encouragement to participate and get stronger to keep with goal of getting home.        Exercises     Shoulder Instructions       General Comments  states his wife's birthday is tomorrow 08/14/20 and he has made arrangements for g.dtr. to surprise her with a cake/card. He is looking forward to face timing her!    Pertinent Vitals/ Pain       Pain Assessment: Faces Faces Pain  Scale: Hurts a little bit Pain Location: abdomen Pain Intervention(s): Limited activity within patient's tolerance;Repositioned;Monitored during session  Home Living                                          Prior Functioning/Environment              Frequency  Min 2X/week        Progress Toward Goals  OT Goals(current goals can now be found in the care plan section)  Progress towards OT goals: Progressing toward goals     Plan Discharge plan remains appropriate    Co-evaluation                 AM-PAC OT "6 Clicks" Daily Activity     Outcome Measure   Help from another person eating meals?: None Help from another person taking care of personal  grooming?: A Little Help from another person toileting, which includes using toliet, bedpan, or urinal?: A Lot Help from another person bathing (including washing, rinsing, drying)?: A Lot Help from another person to put on and taking off regular upper body clothing?: A Little Help from another person to put on and taking off regular lower body clothing?: A Little 6 Click Score: 17    End of Session Equipment Utilized During Treatment: Rolling walker;Oxygen  OT Visit Diagnosis: Unsteadiness on feet (R26.81);Muscle weakness (generalized) (M62.81);Other symptoms and signs involving cognitive function;Pain   Activity Tolerance Patient tolerated treatment well   Patient Left in chair;with call bell/phone within reach   Nurse Communication Other (comment) (spoke with rn regarding BP readings. she states its ok to have him transfer to recliner. also states ok to provide ginger ale and graham crackers for pt.)        Time: 1005-1034 OT Time Calculation (min): 29 min  Charges: OT General Charges $OT Visit: 1 Visit OT Treatments $Self Care/Home Management : 23-37 mins  Sonia Baller, COTA/L Acute Rehabilitation [phone]   Janice Coffin 08/13/2020, 12:44 PM

## 2020-08-13 NOTE — Progress Notes (Signed)
PROGRESS NOTE    Todd Lloyd  CZY:[id number] DOB: May 11, 1941 DOA: 07/26/2020 PCP: Haywood Pao, MD    Brief Narrative:  This 80 year old male with history of COPD, stage I non-small cell lung cancer s/p radiation in 04/2020, dementia, paroxysmal A. fib on Eliquis, GERD, leukemia s/p bone marrow transplantation in 1990 was admitted to ICU under PCCM service with altered mental status after a fall and diarrhea. He was found to be in shock / respiratory failure requiring BiPAP, acute kidney injury, rhabdomyolysis.  CT of the head and C-spine were negative for acute abnormalities. CT of the abdomen and pelvis showed focal colitis without evidence of perforation. Neurology and nephrology were consulted. He was treated with bicarb drip. EEG was negative for seizures. He was treated with broad-spectrum antibiotics initially; stool was positive for rotavirus and enteropathogenic E. Coli, received Zithromax. He was treated with intravenous fluids initially. Urine output has been minimal and he has been getting intermittent Lasix recently by nephrology. Mental status has improved; Neurology signed off. Patient was [facility] to [facility] service on 07/31/2020. GI has been consulted for acute on chronic blood loss anemia.  Patient has had several bowel movement with thick dark blood clots.  Patient has received so far 4 units of PRBCs.  Hemoglobin today 7.5,  seems stable.  Monitor H&H.  May need intervention if He continues have bleeding.  Assessment & Plan:   Principal Problem:   Acute hypercapnic respiratory failure (HCC) Active Problems:   Chronic atrial fibrillation (HCC)   COPD (chronic obstructive pulmonary disease) (HCC)   Chronic anticoagulation   Solitary pulmonary nodule   SIRS (systemic inflammatory response syndrome) (HCC)   AKI (acute kidney injury) (HCC)   Elevated brain natriuretic peptide (BNP) level   Volume overload   Hypotension   Elevated CK   Abnormal CT scan,  colon   Hematochezia   Intestinal infection due to enteropathogenic E. coli   Acute on chronic blood loss anemia, with ongoing GI bleed, hypotension : -Patient presented with clots with large BM, Eliquis was placed on hold -Hemoglobin down to 6.2 on 3/19, Has received 4 units PRBC so far. Hb 7.5 today. -Patient has had several BMs with thick dark clots since then. -Continue PPI drip, consulted with Gastroenterology -Unable to get CTA abdomen due to renal dysfunction, creatinine 4.76.   -Currently not stable for tagged RBC scan with hypotension. - Kcentra was given for Eliquis reversal, last Eliquis dose  3/19 - CCM consulted for hemorrhagic shock, BP was in 80s. Now improved,. 08/08/2020: H&H is stable.  No further bleeding reported. 3/21: Hemoglobin 7.5.  No further bleeding noted. GI recommended monitor H&H, continue PPI every 12h,  bleeding has stopped,  notify GI if has further bleeding. 3/22 : Repeat CT abdomen shows thickening in the descending colon. 3/23: Hemoglobin remained stable.  GI recommended resuming heparin without bolus on 3/ 25, transition to Eliquis on 3/26, if hemoglobin remains stable, consider discharge planning.  High risk colonoscopy if he bleeds.  Acute kidney injury: > Improving -Likely secondary from ATN, septic shock and rhabdomyolysis -Initially treated with IV fluids, subsequently with intermittent Lasix.  Nonoliguric -Nephrology following, creatinine appears to be trending down -Creatinine improving, 1.44 today, AKI improving. -CK has trended down. -Renal ultrasound negative for obstruction, continue to hold losartan, avoid nephrotoxins -Renal functions improving,  Continue to monitor closely.  Acute hypoxic and hypercarbic respiratory failure: sec. to acute COPD exacerbation - On 3/17, patient found to be in hypercarbic respiratory failure, lethargic  not responding to verbal commands.   - ABG showed pH of 7.2, PCO2 70.1.  Overnight patient had refused  BiPAP. - Placed on BiPAP, alert and oriented this morning - 2D echo showed EF of 55 to 60%.  - Patient needs to have BiPAP QHS and PRN.   Acute metabolic encephalopathy superimposed on dementia, myoclonic jerks :> Resolved. -Likely due to hypercarbic respiratory failure, now improved, alert and oriented this morning.   -Patient follows neurology outpatient, Dr. [name].  -CT head negative for acute intracranial pathology -Melatonin discontinued,  avoid sedating meds, BiPAP nightly -Patient had EEG during admission, negative for seizures.  - MRI was initially deferred due to rapid improvement in his mental status. - Continue Namenda, Aricept, fall precautions  Septic shock, present on admission.  Rotavirus and enteropathogenic E. coli diarrhea > Resolved. -Initially treated with aggressive IV fluid hydration, broad-spectrum antibiotics. -Also received empiric antibiotics for meningitis due to altered mental status, DC'd on 07/27/2020. -Blood cultures, urine cultures negative, Covid/flu negative, C. difficile negative -GI PCR was positive for rotavirus/enteropathogenic E. coli, received a dose of Zithromax on 07/28/2020 -Antibiotics have been subsequently discontinued.     History of stage I right lung cancer status post XRT -Outpatient follow-up with oncology  Paroxysmal atrial fibrillation : -Rate controlled, digoxin on hold -Continue to Hold Eliquis. -Plan to resume heparin on 3/25  Leukocytosis -Resolved  Thrombocytopenia: Resolved. monitor platelets  GERD -Continue PPI.  Hypomagnesemia: Replacement in progress.   Obesity Estimated body mass index is 29.74 kg/m as calculated from the following:   Height as of this encounter: 6' (1.829 m).   Weight as of this encounter: 99.5 kg.   DVT prophylaxis: SCDs, Heparin to be started today. Code Status: DNR Family Communication: No family at bed side, Disposition Plan:   Status is: Inpatient  Remains inpatient  appropriate because:Inpatient level of care appropriate due to severity of illness   Dispo: The patient is from: Home              Anticipated d/c is to: SNF              Patient currently is not medically stable to d/c.   Difficult to place patient No   Consultants:   Gastroenterology  Procedures: 2D Echo  Antimicrobials:     Anti-infectives (From admission, onward)   Start     Dose/Rate Route Frequency Ordered Stop   07/28/20 0845  azithromycin (ZITHROMAX) tablet 1,000 mg        1,000 mg Oral  Once 07/28/20 0747 07/28/20 0827   07/28/20 0608  cefTRIAXone (ROCEPHIN) 2 g in sodium chloride 0.9 % 100 mL IVPB  Status:  Discontinued        2 g 200 mL/hr over 30 Minutes Intravenous Every 24 hours 07/27/20 1447 07/28/20 0747   07/27/20 1045  metroNIDAZOLE (FLAGYL) IVPB 500 mg        500 mg 100 mL/hr over 60 Minutes Intravenous  Once 07/27/20 0956 07/27/20 1113   07/26/20 1930  acyclovir (ZOVIRAX) 775 mg in dextrose 5 % 150 mL IVPB  Status:  Discontinued        10 mg/kg  77.6 kg (Ideal) 165.5 mL/hr over 60 Minutes Intravenous Every 24 hours 07/26/20 1848 07/27/20 1424   07/26/20 1915  ampicillin (OMNIPEN) 2 g in sodium chloride 0.9 % 100 mL IVPB  Status:  Discontinued        2 g 300 mL/hr over 20 Minutes Intravenous Every 8 hours 07/26/20 1848 07/27/20  1424   07/26/20 1900  cefTRIAXone (ROCEPHIN) 2 g in sodium chloride 0.9 % 100 mL IVPB  Status:  Discontinued        2 g 200 mL/hr over 30 Minutes Intravenous Every 12 hours 07/26/20 1846 07/27/20 1447   07/26/20 1851  vancomycin variable dose per unstable renal function (pharmacist dosing)  Status:  Discontinued         Does not apply See admin instructions 07/26/20 1853 07/27/20 1424   07/26/20 1815  ceFEPIme (MAXIPIME) 2 g in sodium chloride 0.9 % 100 mL IVPB  Status:  Discontinued        2 g 200 mL/hr over 30 Minutes Intravenous  Once 07/26/20 1802 07/26/20 1846   07/26/20 1815  metroNIDAZOLE (FLAGYL) IVPB 500 mg        500  mg 100 mL/hr over 60 Minutes Intravenous  Once 07/26/20 1802 07/26/20 2049   07/26/20 1815  vancomycin (VANCOREADY) IVPB 1750 mg/350 mL        1,750 mg 175 mL/hr over 120 Minutes Intravenous  Once 07/26/20 1802 07/26/20 2228     Subjective: Patient was seen and examined at bedside.  Overnight events noted. Patient still reports intermittent abdominal discomfort, pepcid and bentyl given. Patient reports no further bleeding. Hb remained stable.   Objective: Vitals:   08/13/20 0315 08/13/20 0500 08/13/20 0741 08/13/20 1209  BP: (!) 151/77  (!) 157/82 (!) 151/68  Pulse: 95  96 94  Resp: 18  20 20   Temp: 98.4 F (36.9 C)  98 F (36.7 C) 98 F (36.7 C)  TempSrc: Oral  Oral Oral  SpO2: 95%  96% 96%  Weight:  99.5 kg    Height:        Intake/Output Summary (Last 24 hours) at 08/13/2020 1252 Last data filed at 08/13/2020 1209 Gross per 24 hour  Intake 600 ml  Output 4150 ml  Net -3550 ml   Filed Weights   08/07/20 0426 08/11/20 0539 08/13/20 0500  Weight: 99.5 kg (!) 224.9 kg 99.5 kg    Examination:  General exam: Appears calm and comfortable, not in any acute distress. Respiratory system: Clear to auscultation. Respiratory effort normal. Cardiovascular system: S1 & S2 heard, RRR. No JVD, murmurs, rubs, gallops or clicks. No pedal edema. Gastrointestinal system: Abdomen is nondistended, soft and nontender. No organomegaly or masses felt.  Normal bowel sounds heard. Central nervous system: Alert and oriented. No focal neurological deficits. Extremities: Symmetric 5 x 5 power. No edema, no cyanosis, no clubbing. Skin: No rashes, lesions or ulcers Psychiatry: Judgement and insight appear normal. Mood & affect appropriate.     Data Reviewed: I have personally reviewed following labs and imaging studies  CBC: Recent Labs  Lab 08/07/20 0139 08/07/20 0338 08/08/20 0146 08/09/20 0122 08/09/20 1012 08/10/20 0129 08/10/20 0731 08/10/20 1304 08/11/20 0306 08/12/20 0347  08/13/20 0235  WBC 4.5  --  5.5 4.8  --  4.6  --   --   --   --   --   HGB 6.1*   < > 7.6* 7.1*   < > 7.1* 7.0* 7.5* 7.1* 7.0* 7.5*  HCT 19.9*   < > 23.0* 21.9*   < > 22.9* 22.2* 24.7* 22.8* 22.9* 23.9*  MCV 90.0  --  84.9 86.6  --  89.5  --   --   --   --   --   PLT 198  --  211 203  --  209  --   --   --   --   --    < > =  values in this interval not displayed.   Basic Metabolic Panel: Recent Labs  Lab 08/07/20 0139 08/08/20 0146 08/09/20 0122 08/10/20 0129 08/11/20 0306 08/12/20 0347 08/13/20 0235  NA 144   < > 143 143 141 143 142  K 3.5   < > 3.5 3.5 3.3* 3.9 4.2  CL 106   < > 105 104 101 104 101  CO2 31   < > 32 32 34* 35* 35*  GLUCOSE 265*   < > 108* 100* 100* 96 97  BUN 90*   < > 69* 54* 41* 32* 25*  CREATININE 4.34*   < > 3.02* 2.21* 1.77* 1.56* 1.44*  CALCIUM 7.6*   < > 7.4* 7.5* 7.7* 8.1* 8.3*  MG  --   --   --  1.1* 1.2* 1.3* 1.3*  PHOS 5.0*  --   --  5.0* 4.6 4.0 3.0   < > = values in this interval not displayed.   GFR: Estimated Creatinine Clearance: 50 mL/min (A) (by C-G formula based on SCr of 1.44 mg/dL (H)). Liver Function Tests: Recent Labs  Lab 08/07/20 0139 08/09/20 0336  AST  --  41  ALT  --  40  ALKPHOS  --  99  BILITOT  --  0.6  PROT  --  4.6*  ALBUMIN 2.0* 2.4*   No results for input(s): LIPASE, AMYLASE in the last 168 hours. No results for input(s): AMMONIA in the last 168 hours. Coagulation Profile: No results for input(s): INR, PROTIME in the last 168 hours. Cardiac Enzymes: No results for input(s): CKTOTAL, CKMB, CKMBINDEX, TROPONINI in the last 168 hours. BNP (last 3 results) No results for input(s): PROBNP in the last 8760 hours. HbA1C: No results for input(s): HGBA1C in the last 72 hours. CBG: Recent Labs  Lab 08/12/20 1942 08/12/20 2314 08/13/20 0314 08/13/20 0741 08/13/20 1224  GLUCAP 129* 95 102* 88 137*   Lipid Profile: No results for input(s): CHOL, HDL, LDLCALC, TRIG, CHOLHDL, LDLDIRECT in the last 72  hours. Thyroid Function Tests: No results for input(s): TSH, T4TOTAL, FREET4, T3FREE, THYROIDAB in the last 72 hours. Anemia Panel: No results for input(s): VITAMINB12, FOLATE, FERRITIN, TIBC, IRON, RETICCTPCT in the last 72 hours. Sepsis Labs: No results for input(s): PROCALCITON, LATICACIDVEN in the last 168 hours.  No results found for this or any previous visit (from the past 240 hour(s)).    Radiology Studies: No results found. Scheduled Meds: . acetaminophen  650 mg Oral TID  . budesonide (PULMICORT) nebulizer solution  0.5 mg Nebulization BID  . calcium carbonate  1 tablet Oral Q breakfast  . chlorhexidine  15 mL Mouth Rinse BID  . Chlorhexidine Gluconate Cloth  6 each Topical Q0600  . dextromethorphan-guaiFENesin  1 tablet Oral BID  . donepezil  5 mg Oral QHS  . feeding supplement  1 Container Oral TID WC  . mouth rinse  15 mL Mouth Rinse q12n4p  . memantine  5 mg Oral BID  . pantoprazole  40 mg Oral Daily   Continuous Infusions: . sodium chloride 10 mL/hr at 08/10/20 1600  . lactated ringers 10 mL/hr at 07/30/20 1800     LOS: 18 days    Time spent: 25 mins    Loi Rennaker, MD [facility]   If 7PM-7AM, please contact night-coverage

## 2020-08-13 NOTE — Progress Notes (Signed)
ANTICOAGULATION CONSULT NOTE - Initial Consult  Pharmacy Consult:  Heparin Indication: atrial fibrillation  Allergies  Allergen Reactions  . Lyrica [Pregabalin]     confusion  . Morphine And Related     confusion    Patient Measurements: Height: 6' (182.9 cm) Weight: 99.5 kg (219 lb 4.8 oz) IBW/kg (Calculated) : 77.6 Heparin Dosing Weight: 97 kg  Vital Signs: Temp: 98 F (36.7 C) (03/25 1209) Temp Source: Oral (03/25 1209) BP: 151/68 (03/25 1209) Pulse Rate: 94 (03/25 1209)  Labs: Recent Labs    08/11/20 0306 08/12/20 0347 08/12/20 0915 08/12/20 1052 08/13/20 0235  HGB 7.1* 7.0*  --   --  7.5*  HCT 22.8* 22.9*  --   --  23.9*  CREATININE 1.77* 1.56*  --   --  1.44*  TROPONINIHS  --   --  19* 21*  --     Estimated Creatinine Clearance: 50 mL/min (A) (by C-G formula based on SCr of 1.44 mg/dL (H)).   Medical History: Past Medical History:  Diagnosis Date  . Acid reflux   . Atrial fibrillation ([place])   . Chronic low back pain 07/16/2018  . Colon polyps   . COPD (chronic obstructive pulmonary disease) ([place])   . Dysrhythmia    afib  . Gait abnormality 09/12/2016  . High cholesterol   . Hypertension   . 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 Memory difficulties 09/12/2016  . Peripheral neuropathy 10/18/2016  . RLS (restless legs syndrome) 10/18/2016     Assessment: 27 YOM with history of Afib on Eliquis PTA, presented s/p fall.  Patient was transitioned to IV heparin and then Eliquis was resumed.  He developed GIB with hypotension and received KCentra on 08/06/20.  Now stable and Pharmacy consulted to resume [facility] with IV heparin.    Last Eliquis dose was on 3/17 PM - expect it to be cleared from patient's system. Hemoglobin/hematocrit low and stable, plts WNL; no bleeding reported.  Goal of Therapy:  Heparin level 0.3-0.5 units/ml Monitor platelets by anticoagulation protocol: Yes   Plan:  Initiate heparin infusion at 1150 units/hr - no  bolus per MD Check 8 hr heparin level Daily heparin level and CBC F/u with ability to transition to DOAC in ~48 hours    Philana Younis D. [name] Marble, PharmD, BCPS, [place] 08/13/2020, 1:13 PM

## 2020-08-13 NOTE — Progress Notes (Signed)
ANTICOAGULATION CONSULT NOTE   Pharmacy Consult:  Heparin Indication: atrial fibrillation  Allergies  Allergen Reactions  . Lyrica [Pregabalin]     confusion  . Morphine And Related     confusion    Patient Measurements: Height: 6' (182.9 cm) Weight: 99.5 kg (219 lb 4.8 oz) IBW/kg (Calculated) : 77.6 Heparin Dosing Weight: 97 kg  Vital Signs: Temp: 98.8 F (37.1 C) (03/25 2250) Temp Source: Oral (03/25 2250) BP: 125/57 (03/25 2250) Pulse Rate: 100 (03/25 2250)  Labs: Recent Labs    08/11/20 0306 08/12/20 0347 08/12/20 0915 08/12/20 1052 08/13/20 0235 08/13/20 2227  HGB 7.1* 7.0*  --   --  7.5*  --   HCT 22.8* 22.9*  --   --  23.9*  --   HEPARINUNFRC  --   --   --   --   --  0.14*  CREATININE 1.77* 1.56*  --   --  1.44*  --   TROPONINIHS  --   --  19* 21*  --   --     Estimated Creatinine Clearance: 50 mL/min (A) (by C-G formula based on SCr of 1.44 mg/dL (H)).   Medical History: Past Medical History:  Diagnosis Date  . Acid reflux   . Atrial fibrillation ([place])   . Chronic low back pain 07/16/2018  . Colon polyps   . COPD (chronic obstructive pulmonary disease) ([place])   . Dysrhythmia    afib  . Gait abnormality 09/12/2016  . High cholesterol   . Hypertension   . 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 Memory difficulties 09/12/2016  . Peripheral neuropathy 10/18/2016  . RLS (restless legs syndrome) 10/18/2016     Assessment: 57 YOM with history of Afib on Eliquis PTA, presented s/p fall.  Patient was transitioned to IV heparin and then Eliquis was resumed.  He developed GIB with hypotension and received KCentra on 08/06/20.  Now stable and Pharmacy consulted to resume [facility] with IV heparin.    Last Eliquis dose was on 3/17 PM - expect it to be cleared from patient's system. Hemoglobin/hematocrit low and stable, plts WNL; no bleeding reported.  3/25 PM update:  Heparin level below goal tonight  Goal of Therapy:  Heparin level 0.3-0.5  units/ml Monitor platelets by anticoagulation protocol: Yes   Plan:  Inc heparin to 1250 units/hr-no boluses  Check 8 hr heparin level Daily heparin level and CBC F/u with ability to transition to DOAC in ~48 hours    Narda Bonds, PharmD, [place] Pharmacist Phone: [phone]

## 2020-08-14 DIAGNOSIS — J9602 Acute respiratory failure with hypercapnia: Secondary | ICD-10-CM | POA: Diagnosis not present

## 2020-08-14 LAB — BASIC METABOLIC PANEL
Anion gap: 8 (ref 5–15)
BUN: 23 mg/dL (ref 8–23)
CO2: 38 mmol/L — ABNORMAL HIGH (ref 22–32)
Calcium: 8.5 mg/dL — ABNORMAL LOW (ref 8.9–10.3)
Chloride: 97 mmol/L — ABNORMAL LOW (ref 98–111)
Creatinine, Ser: 1.29 mg/dL — ABNORMAL HIGH (ref 0.61–1.24)
GFR, Estimated: 56 mL/min — ABNORMAL LOW (ref >60)
Glucose, Bld: 122 mg/dL — ABNORMAL HIGH (ref 70–99)
Potassium: 3.6 mmol/L (ref 3.5–5.1)
Sodium: 143 mmol/L (ref 135–145)

## 2020-08-14 LAB — CBC
HCT: 23.7 % — ABNORMAL LOW (ref 39.0–52.0)
Hemoglobin: 7.4 g/dL — ABNORMAL LOW (ref 13.0–17.0)
MCH: 28.7 pg (ref 26.0–34.0)
MCHC: 31.2 g/dL (ref 30.0–36.0)
MCV: 91.9 fL (ref 80.0–100.0)
Platelets: 199 10*3/uL (ref 150–400)
RBC: 2.58 MIL/uL — ABNORMAL LOW (ref 4.22–5.81)
RDW: 19.5 % — ABNORMAL HIGH (ref 11.5–15.5)
WBC: 6.4 10*3/uL (ref 4.0–10.5)
nRBC: 0 % (ref 0.0–0.2)

## 2020-08-14 LAB — TROPONIN I (HIGH SENSITIVITY): Troponin I (High Sensitivity): 38 ng/L — ABNORMAL HIGH (ref <18)

## 2020-08-14 LAB — BRAIN NATRIURETIC PEPTIDE: B Natriuretic Peptide: 570.1 pg/mL — ABNORMAL HIGH (ref 0.0–100.0)

## 2020-08-14 LAB — HEPARIN LEVEL (UNFRACTIONATED): Heparin Unfractionated: 0.25 IU/mL — ABNORMAL LOW (ref 0.30–0.70)

## 2020-08-14 MED ORDER — APIXABAN 5 MG PO TABS
5.0000 mg | ORAL_TABLET | Freq: Two times a day (BID) | ORAL | Status: DC
  Administered 2020-08-14 (×2): 5 mg via ORAL
  Filled 2020-08-14 (×2): qty 1

## 2020-08-14 NOTE — Progress Notes (Addendum)
Addendum: Spoke with Dr. Dwyane Dee and will transition from heparin to apixaban with GI guidance. Patient was on apixaban 5 mg BID prior to admission for atrial fibrillation. Age, Scr 1.29, Wt 96. Will restart apixaban 5 mg BID and monitor CBC.    ANTICOAGULATION CONSULT NOTE   Pharmacy Consult:  Heparin Indication: atrial fibrillation   Patient Measurements: Height: 6' (182.9 cm) Weight: 96.4 kg (212 lb 9.6 oz) IBW/kg (Calculated) : 77.6 Heparin Dosing Weight: 97 kg  Vital Signs: Temp: 98 F (36.7 C) (03/26 0754) Temp Source: Oral (03/26 0754) BP: 151/76 (03/26 0754) Pulse Rate: 97 (03/26 0754)  Labs: Recent Labs    08/12/20 0347 08/12/20 0915 08/12/20 1052 08/13/20 0235 08/13/20 2227 08/14/20 0510 08/14/20 0807  HGB 7.0*  --   --  7.5*  --  7.4*  --   HCT 22.9*  --   --  23.9*  --  23.7*  --   PLT  --   --   --   --   --  199  --   HEPARINUNFRC  --   --   --   --  0.14*  --  0.25*  CREATININE 1.56*  --   --  1.44*  --  1.29*  --   TROPONINIHS  --  19* 21*  --   --  38*  --     Estimated Creatinine Clearance: 55 mL/min (A) (by C-G formula based on SCr of 1.29 mg/dL (H)).   Medical History: COPD, stage 1 lung cancer 2021, dementia, A fib on Eliquis, GERD, leukemia   Assessment: 45 YOM with history of Afib on Eliquis PTA, presented s/p fall.  Patient was transitioned to IV heparin and then Eliquis was resumed.  He developed GIB with hypotension and received KCentra on 08/06/20.  Now stable and Pharmacy consulted to resume [facility] with IV heparin.    Last Eliquis dose was on 3/17 PM - expect it to be cleared from patient's system. HL this AM is 25 which is slightly subtherapeutic. Hgb 7.4 stable over last three days. Plt 199. No issues with line or bleeding noted today.   Goal of Therapy:  Heparin level 0.3-0.5 units/ml Monitor platelets by anticoagulation protocol: Yes   Plan:  Increase heparin to 1350 units/hr  Check 8 hr heparin level Daily heparin level and CBC F/u  with ability to transition to DOAC in ~48 hours    Cephus Slater, PharmD, [place] Resident [phone] 08/14/2020 9:16 AM

## 2020-08-14 NOTE — Progress Notes (Signed)
C/o shortness of breath.  Sats 97% on 2 LNC.  Started breathing treatment and gave cough medicine.

## 2020-08-14 NOTE — Progress Notes (Signed)
PROGRESS NOTE    Todd Lloyd  RAQ:[id number] DOB: Apr 24, 1941 DOA: 07/26/2020 PCP: Haywood Pao, MD    Brief Narrative:  This 80 year old male with history of COPD, stage I non-small cell lung cancer s/p radiation in 04/2020, dementia, paroxysmal A. fib on Eliquis, GERD, leukemia s/p bone marrow transplantation in 1990 was admitted to ICU under PCCM service with altered mental status after a fall and diarrhea. He was found to be in shock / respiratory failure requiring BiPAP, acute kidney injury, rhabdomyolysis.  CT of the head and C-spine were negative for acute abnormalities. CT of the abdomen and pelvis showed focal colitis without evidence of perforation. Neurology and nephrology were consulted. He was treated with bicarb drip. EEG was negative for seizures. He was treated with broad-spectrum antibiotics initially; stool was positive for rotavirus and enteropathogenic E. Coli, received Zithromax. He was treated with intravenous fluids initially. Urine output has been minimal and he has been getting intermittent Lasix recently by nephrology. Mental status has improved; Neurology signed off. Patient was transferred to [facility] service on 07/31/2020. GI has been consulted for acute on chronic blood loss anemia.  Patient has had several bowel movement with thick dark blood clots.  Patient has received so far 4 units of PRBCs.  Hemoglobin today 7.5,  seems stable.  Monitor H&H.  May need intervention if He continues have bleeding.  Assessment & Plan:   Principal Problem:   Acute hypercapnic respiratory failure (HCC) Active Problems:   Chronic atrial fibrillation (HCC)   COPD (chronic obstructive pulmonary disease) (HCC)   Chronic anticoagulation   Solitary pulmonary nodule   SIRS (systemic inflammatory response syndrome) (HCC)   AKI (acute kidney injury) (HCC)   Elevated brain natriuretic peptide (BNP) level   Volume overload   Hypotension   Elevated CK   Abnormal CT scan,  colon   Hematochezia   Intestinal infection due to enteropathogenic E. coli   Acute on chronic blood loss anemia, with ongoing GI bleed, hypotension : -Patient presented with clots with large BM, Eliquis was placed on hold -Hemoglobin down to 6.2 on 3/19, Has received 4 units PRBC so far. Hb 7.5 today. -Patient has had several BMs with thick dark clots since then. -Continue PPI drip, consulted with Gastroenterology -Unable to get CTA abdomen due to renal dysfunction, creatinine 4.76.   -Currently not stable for tagged RBC scan with hypotension. - Kcentra was given for Eliquis reversal, last Eliquis dose  3/19 - CCM consulted for hemorrhagic shock, BP was in 80s. Now improved,. 3/21: Hemoglobin 7.5.  No further bleeding noted. GI recommended monitor H&H, continue PPI every 12h,  bleeding has stopped,  notify GI if has further bleeding. 3/22 : Repeat CT abdomen shows thickening in the descending colon. 3/23: Hemoglobin remained stable.  GI recommended resuming heparin without bolus on 3/ 25, transition to Eliquis on 3/26, if hemoglobin remains stable, consider discharge planning.  High risk colonoscopy if he bleeds. 3/25: Heparin resumed, hemoglobin remained stable 3/26: We will transition to Eliquis, monitor H&H  Acute kidney injury: > Improving -Likely secondary from ATN, septic shock and rhabdomyolysis -Initially treated with IV fluids, subsequently with intermittent Lasix.  Nonoliguric -Nephrology following, creatinine appears to be trending down -Creatinine improving, 1.22 today, AKI improving. -CK has trended down. -Renal ultrasound negative for obstruction, continue to hold losartan, avoid nephrotoxins -Renal functions improving,  Continue to monitor closely.  Acute hypoxic and hypercarbic respiratory failure: sec. to acute COPD exacerbation - On 3/17, patient found to be  in hypercarbic respiratory failure, lethargic not responding to verbal commands.   - ABG showed pH of 7.2,  PCO2 70.1.  Overnight patient had refused BiPAP. - Placed on BiPAP, alert and oriented this morning - 2D echo showed EF of 55 to 60%.  - Patient needs to have BiPAP QHS and PRN.   Acute metabolic encephalopathy superimposed on dementia, myoclonic jerks :> Resolved. -Likely due to hypercarbic respiratory failure, now improved, alert and oriented this morning.   -Patient follows neurology outpatient, Dr. [name].  -CT head negative for acute intracranial pathology -Melatonin discontinued, avoid sedating meds, BiPAP nightly -Patient had EEG during admission, negative for seizures.  - MRI was initially deferred due to rapid improvement in his mental status. - Continue Namenda, Aricept, fall precautions  Septic shock, present on admission.  Rotavirus and enteropathogenic E. coli diarrhea > Resolved. -Initially treated with aggressive IV fluid hydration, broad-spectrum antibiotics. -Also received empiric antibiotics for meningitis due to altered mental status, DC'd on 07/27/2020. -Blood cultures, urine cultures negative, Covid/flu negative, C. difficile negative -GI PCR was positive for rotavirus/enteropathogenic E. coli, received a dose of Zithromax on 07/28/2020 -Antibiotics have been subsequently discontinued.     History of stage I right lung cancer status post XRT -Outpatient follow-up with oncology  Paroxysmal atrial fibrillation : -Rate controlled, digoxin on hold -Continue to Hold Eliquis. -Heparin resumed on 3/25, transition to Eliquis on 3/26  Leukocytosis -Resolved  Thrombocytopenia: Resolved. monitor platelets  GERD -Continue PPI.  Hypomagnesemia: Replacement in progress.   Obesity Estimated body mass index is 29.74 kg/m as calculated from the following:   Height as of this encounter: 6' (1.829 m).   Weight as of this encounter: 99.5 kg.   DVT prophylaxis: SCDs, Heparin to be started today. Code Status: DNR Family Communication: No family at bed  side, Disposition Plan:   Status is: Inpatient  Remains inpatient appropriate because:Inpatient level of care appropriate due to severity of illness   Dispo: The patient is from: Home              Anticipated d/c is to: SNF              Patient currently is not medically stable to d/c.   Difficult to place patient No   Consultants:   Gastroenterology  Procedures: 2D Echo  Antimicrobials:     Anti-infectives (From admission, onward)   Start     Dose/Rate Route Frequency Ordered Stop   07/28/20 0845  azithromycin (ZITHROMAX) tablet 1,000 mg        1,000 mg Oral  Once 07/28/20 0747 07/28/20 0827   07/28/20 0608  cefTRIAXone (ROCEPHIN) 2 g in sodium chloride 0.9 % 100 mL IVPB  Status:  Discontinued        2 g 200 mL/hr over 30 Minutes Intravenous Every 24 hours 07/27/20 1447 07/28/20 0747   07/27/20 1045  metroNIDAZOLE (FLAGYL) IVPB 500 mg        500 mg 100 mL/hr over 60 Minutes Intravenous  Once 07/27/20 0956 07/27/20 1113   07/26/20 1930  acyclovir (ZOVIRAX) 775 mg in dextrose 5 % 150 mL IVPB  Status:  Discontinued        10 mg/kg  77.6 kg (Ideal) 165.5 mL/hr over 60 Minutes Intravenous Every 24 hours 07/26/20 1848 07/27/20 1424   07/26/20 1915  ampicillin (OMNIPEN) 2 g in sodium chloride 0.9 % 100 mL IVPB  Status:  Discontinued        2 g 300 mL/hr over 20 Minutes  Intravenous Every 8 hours 07/26/20 1848 07/27/20 1424   07/26/20 1900  cefTRIAXone (ROCEPHIN) 2 g in sodium chloride 0.9 % 100 mL IVPB  Status:  Discontinued        2 g 200 mL/hr over 30 Minutes Intravenous Every 12 hours 07/26/20 1846 07/27/20 1447   07/26/20 1851  vancomycin variable dose per unstable renal function (pharmacist dosing)  Status:  Discontinued         Does not apply See admin instructions 07/26/20 1853 07/27/20 1424   07/26/20 1815  ceFEPIme (MAXIPIME) 2 g in sodium chloride 0.9 % 100 mL IVPB  Status:  Discontinued        2 g 200 mL/hr over 30 Minutes Intravenous  Once 07/26/20 1802 07/26/20  1846   07/26/20 1815  metroNIDAZOLE (FLAGYL) IVPB 500 mg        500 mg 100 mL/hr over 60 Minutes Intravenous  Once 07/26/20 1802 07/26/20 2049   07/26/20 1815  vancomycin (VANCOREADY) IVPB 1750 mg/350 mL        1,750 mg 175 mL/hr over 120 Minutes Intravenous  Once 07/26/20 1802 07/26/20 2228     Subjective: Patient was seen and examined at bedside.  Overnight events noted. Patient patient reports abdominal discomfort has resolved.   He complains of cough and mild shortness of breath. Patient reports no further bleeding. Hb remained stable.   Objective: Vitals:   08/14/20 0446 08/14/20 0754 08/14/20 0948 08/14/20 1140  BP:  (!) 151/76  (!) 113/59  Pulse:  97  93  Resp:  20  18  Temp:  98 F (36.7 C)  98.4 F (36.9 C)  TempSrc:  Oral  Oral  SpO2:  96% 97% 97%  Weight: 96.4 kg     Height:        Intake/Output Summary (Last 24 hours) at 08/14/2020 1154 Last data filed at 08/14/2020 5361 Gross per 24 hour  Intake 1564.42 ml  Output 5001 ml  Net -3436.58 ml   Filed Weights   08/11/20 0539 08/13/20 0500 08/14/20 0446  Weight: (!) 224.9 kg 99.5 kg 96.4 kg    Examination:  General exam: Appears calm and comfortable, not in any acute distress. Respiratory system: Clear to auscultation. Respiratory effort normal. Cardiovascular system: S1 & S2 heard, RRR. No JVD, murmurs, rubs, gallops or clicks. No pedal edema. Gastrointestinal system: Abdomen is nondistended, soft and nontender. No organomegaly or masses felt.  Normal bowel sounds heard. Central nervous system: Alert and oriented. No focal neurological deficits. Extremities: Symmetric 5 x 5 power. No edema, no cyanosis, no clubbing. Skin: No rashes, lesions or ulcers Psychiatry: Judgement and insight appear normal. Mood & affect appropriate.     Data Reviewed: I have personally reviewed following labs and imaging studies  CBC: Recent Labs  Lab 08/08/20 0146 08/09/20 0122 08/09/20 1012 08/10/20 0129 08/10/20 0731  08/10/20 1304 08/11/20 0306 08/12/20 0347 08/13/20 0235 08/14/20 0510  WBC 5.5 4.8  --  4.6  --   --   --   --   --  6.4  HGB 7.6* 7.1*   < > 7.1*   < > 7.5* 7.1* 7.0* 7.5* 7.4*  HCT 23.0* 21.9*   < > 22.9*   < > 24.7* 22.8* 22.9* 23.9* 23.7*  MCV 84.9 86.6  --  89.5  --   --   --   --   --  91.9  PLT 211 203  --  209  --   --   --   --   --  199   < > = values in this interval not displayed.   Basic Metabolic Panel: Recent Labs  Lab 08/10/20 0129 08/11/20 0306 08/12/20 0347 08/13/20 0235 08/14/20 0510  NA 143 141 143 142 143  K 3.5 3.3* 3.9 4.2 3.6  CL 104 101 104 101 97*  CO2 32 34* 35* 35* 38*  GLUCOSE 100* 100* 96 97 122*  BUN 54* 41* 32* 25* 23  CREATININE 2.21* 1.77* 1.56* 1.44* 1.29*  CALCIUM 7.5* 7.7* 8.1* 8.3* 8.5*  MG 1.1* 1.2* 1.3* 1.3*  --   PHOS 5.0* 4.6 4.0 3.0  --    GFR: Estimated Creatinine Clearance: 55 mL/min (A) (by C-G formula based on SCr of 1.29 mg/dL (H)). Liver Function Tests: Recent Labs  Lab 08/09/20 0336  AST 41  ALT 40  ALKPHOS 99  BILITOT 0.6  PROT 4.6*  ALBUMIN 2.4*   No results for input(s): LIPASE, AMYLASE in the last 168 hours. No results for input(s): AMMONIA in the last 168 hours. Coagulation Profile: No results for input(s): INR, PROTIME in the last 168 hours. Cardiac Enzymes: No results for input(s): CKTOTAL, CKMB, CKMBINDEX, TROPONINI in the last 168 hours. BNP (last 3 results) No results for input(s): PROBNP in the last 8760 hours. HbA1C: No results for input(s): HGBA1C in the last 72 hours. CBG: Recent Labs  Lab 08/12/20 2314 08/13/20 0314 08/13/20 0741 08/13/20 1224 08/13/20 1605  GLUCAP 95 102* 88 137* 103*   Lipid Profile: No results for input(s): CHOL, HDL, LDLCALC, TRIG, CHOLHDL, LDLDIRECT in the last 72 hours. Thyroid Function Tests: No results for input(s): TSH, T4TOTAL, FREET4, T3FREE, THYROIDAB in the last 72 hours. Anemia Panel: No results for input(s): VITAMINB12, FOLATE, FERRITIN, TIBC, IRON,  RETICCTPCT in the last 72 hours. Sepsis Labs: No results for input(s): PROCALCITON, LATICACIDVEN in the last 168 hours.  No results found for this or any previous visit (from the past 240 hour(s)).    Radiology Studies: No results found. Scheduled Meds: . acetaminophen  650 mg Oral TID  . budesonide (PULMICORT) nebulizer solution  0.5 mg Nebulization BID  . calcium carbonate  1 tablet Oral Q breakfast  . chlorhexidine  15 mL Mouth Rinse BID  . Chlorhexidine Gluconate Cloth  6 each Topical Q0600  . dextromethorphan-guaiFENesin  1 tablet Oral BID  . dicyclomine  10 mg Oral TID AC  . donepezil  5 mg Oral QHS  . feeding supplement  1 Container Oral TID WC  . furosemide  40 mg Intravenous Daily  . mouth rinse  15 mL Mouth Rinse q12n4p  . memantine  5 mg Oral BID  . pantoprazole  40 mg Oral Daily   Continuous Infusions: . sodium chloride 10 mL/hr at 08/10/20 1600  . heparin 1,350 Units/hr (08/14/20 0922)  . lactated ringers 10 mL/hr at 07/30/20 1800     LOS: 19 days    Time spent: 25 mins    PARDEEP KUMAR, MD [facility]   If 7PM-7AM, please contact night-coverage

## 2020-08-15 DIAGNOSIS — J9602 Acute respiratory failure with hypercapnia: Secondary | ICD-10-CM | POA: Diagnosis not present

## 2020-08-15 LAB — MAGNESIUM: Magnesium: 1.3 mg/dL — ABNORMAL LOW (ref 1.7–2.4)

## 2020-08-15 LAB — BASIC METABOLIC PANEL
Anion gap: 5 (ref 5–15)
BUN: 20 mg/dL (ref 8–23)
CO2: 41 mmol/L — ABNORMAL HIGH (ref 22–32)
Calcium: 8 mg/dL — ABNORMAL LOW (ref 8.9–10.3)
Chloride: 96 mmol/L — ABNORMAL LOW (ref 98–111)
Creatinine, Ser: 1.35 mg/dL — ABNORMAL HIGH (ref 0.61–1.24)
GFR, Estimated: 53 mL/min — ABNORMAL LOW (ref >60)
Glucose, Bld: 98 mg/dL (ref 70–99)
Potassium: 3.6 mmol/L (ref 3.5–5.1)
Sodium: 142 mmol/L (ref 135–145)

## 2020-08-15 LAB — CBC
HCT: 22.8 % — ABNORMAL LOW (ref 39.0–52.0)
Hemoglobin: 6.8 g/dL — CL (ref 13.0–17.0)
MCH: 28.6 pg (ref 26.0–34.0)
MCHC: 29.8 g/dL — ABNORMAL LOW (ref 30.0–36.0)
MCV: 95.8 fL (ref 80.0–100.0)
Platelets: 180 10*3/uL (ref 150–400)
RBC: 2.38 MIL/uL — ABNORMAL LOW (ref 4.22–5.81)
RDW: 19.8 % — ABNORMAL HIGH (ref 11.5–15.5)
WBC: 4 10*3/uL (ref 4.0–10.5)
nRBC: 0 % (ref 0.0–0.2)

## 2020-08-15 LAB — HEMOGLOBIN AND HEMATOCRIT, BLOOD
HCT: 27 % — ABNORMAL LOW (ref 39.0–52.0)
Hemoglobin: 8.2 g/dL — ABNORMAL LOW (ref 13.0–17.0)

## 2020-08-15 LAB — PREPARE RBC (CROSSMATCH)

## 2020-08-15 LAB — PHOSPHORUS: Phosphorus: 3.7 mg/dL (ref 2.5–4.6)

## 2020-08-15 MED ORDER — SODIUM CHLORIDE 0.9% IV SOLUTION: Freq: Once | INTRAVENOUS | Status: DC

## 2020-08-15 MED ORDER — MAGNESIUM SULFATE 2 GM/50ML IV SOLN
2.0000 g | Freq: Once | INTRAVENOUS | Status: AC
  Administered 2020-08-15: 2 g via INTRAVENOUS
  Filled 2020-08-15: qty 50

## 2020-08-15 NOTE — Progress Notes (Signed)
Subjective: No complaints.  No reports of hematochezia or melena.  Objective: Vital signs in last 24 hours: Temp:  [97.9 F (36.6 C)-98.5 F (36.9 C)] 98.2 F (36.8 C) (03/27 0930) Pulse Rate:  [84-95] 95 (03/27 0655) Resp:  [18-24] 20 (03/27 0655) BP: (99-129)/(52-78) 129/78 (03/27 0900) SpO2:  [93 %-100 %] 98 % (03/27 0921) FiO2 (%):  [28 %] 28 % (03/27 0921) Weight:  [95.5 kg] 95.5 kg (03/27 0500) Last BM Date: 08/13/20  Intake/Output from previous day: 03/26 0701 - 03/27 0700 In: -  Out: 3401 [Urine:3400; Stool:1] Intake/Output this shift: Total I/O In: 367.5 [Blood:367.5] Out: -   General appearance: arousable, weak, frail, soft spoken Resp: inspiratory and expiratory wheezing Cardio: irregularly irregular rhythm GI: soft, non-tender; bowel sounds normal; no masses,  no organomegaly Extremities: extremities normal, atraumatic, no cyanosis or edema  Lab Results: Recent Labs    08/13/20 0235 08/14/20 0510 08/15/20 0026  WBC  --  6.4 4.0  HGB 7.5* 7.4* 6.8*  HCT 23.9* 23.7* 22.8*  PLT  --  199 180   BMET Recent Labs    08/13/20 0235 08/14/20 0510 08/15/20 0026  NA 142 143 142  K 4.2 3.6 3.6  CL 101 97* 96*  CO2 35* 38* 41*  GLUCOSE 97 122* 98  BUN 25* 23 20  CREATININE 1.44* 1.29* 1.35*  CALCIUM 8.3* 8.5* 8.0*   LFT No results for input(s): PROT, ALBUMIN, AST, ALT, ALKPHOS, BILITOT, BILIDIR, IBILI in the last 72 hours. PT/INR No results for input(s): LABPROT, INR in the last 72 hours. Hepatitis Panel No results for input(s): HEPBSAG, HCVAB, HEPAIGM, HEPBIGM in the last 72 hours. C-Diff No results for input(s): CDIFFTOX in the last 72 hours. Fecal Lactopherrin No results for input(s): FECLLACTOFRN in the last 72 hours.  Studies/Results: No results found.  Medications:  Scheduled: . sodium chloride   Intravenous Once  . acetaminophen  650 mg Oral TID  . budesonide (PULMICORT) nebulizer solution  0.5 mg Nebulization BID  . calcium carbonate   1 tablet Oral Q breakfast  . chlorhexidine  15 mL Mouth Rinse BID  . Chlorhexidine Gluconate Cloth  6 each Topical Q0600  . dextromethorphan-guaiFENesin  1 tablet Oral BID  . dicyclomine  10 mg Oral TID AC  . donepezil  5 mg Oral QHS  . feeding supplement  1 Container Oral TID WC  . furosemide  40 mg Intravenous Daily  . mouth rinse  15 mL Mouth Rinse q12n4p  . memantine  5 mg Oral BID  . pantoprazole  40 mg Oral Daily   Continuous: . sodium chloride Stopped (08/15/20 0943)  . lactated ringers 10 mL/hr at 07/30/20 1800    Assessment/Plan: 1) Worsening anemia. 2) Afib. 3) COPD. 4) S/p BMT in 1990.   The patient's chart was reviewed in detail.  He was restarted back on heparin and then transitioned to Eliquis, per GI recommendations.  His anemia worsened and it dropped down to 6.8 g/dL from 7.4 g/dL.  He was identified previously to have and EPEC and Rotavirus infection.  A CT scan did show a right sided colitis.  The patient is in poor health and a colonoscopy is high risk.  In fact, it is not advisable.  To diagnose a focal area of bleeding that is amenable to treatment is unlikely in this patient.  By all accounts he should be healing up from the colitis this area.  There are no overt signs of bleeding.  The risks and benefits  for long term anticoagulation need to be considered.    Plan: 1) Agree with the transfusion. 2) Hold Eliquis for now. 3) A rechallenge with heparin is reasonable tomorrow or the next day. 4) [facility] to resume care in the AM on Monday.  LOS: 20 days   HUNG,PATRICK D 08/15/2020, 10:41 AM

## 2020-08-15 NOTE — Progress Notes (Signed)
PROGRESS NOTE    Todd Lloyd  MBW:[id number] DOB: 1941-05-02 DOA: 07/26/2020 PCP: Haywood Pao, MD    Brief Narrative:  This 80 year old male with history of COPD, stage I non-small cell lung cancer s/p radiation in 04/2020, dementia, paroxysmal A. fib on Eliquis, GERD, leukemia s/p bone marrow transplantation in 1990 was admitted to ICU under PCCM service with altered mental status after a fall and diarrhea. He was found to be in shock / respiratory failure requiring BiPAP, acute kidney injury, rhabdomyolysis.  CT of the head and C-spine were negative for acute abnormalities. CT of the abdomen and pelvis showed focal colitis without evidence of perforation. Neurology and nephrology were consulted. He was treated with bicarb drip. EEG was negative for seizures. He was treated with broad-spectrum antibiotics initially; stool was positive for rotavirus and enteropathogenic E. Coli, received Zithromax. He was treated with intravenous fluids initially. Urine output has been minimal and he has been getting intermittent Lasix recently by nephrology. Mental status has improved; Neurology signed off. Patient was transferred to [facility] service on 07/31/2020. GI has been consulted for acute on chronic blood loss anemia.  Patient has had several bowel movement with thick dark blood clots.  Patient has received so far 4 units of PRBCs.  Hemoglobin today 7.5,  seems stable.  Monitor H&H.  May need intervention if He continues have bleeding.  Assessment & Plan:   Principal Problem:   Acute hypercapnic respiratory failure (HCC) Active Problems:   Chronic atrial fibrillation (HCC)   COPD (chronic obstructive pulmonary disease) (HCC)   Chronic anticoagulation   Solitary pulmonary nodule   SIRS (systemic inflammatory response syndrome) (HCC)   AKI (acute kidney injury) (HCC)   Elevated brain natriuretic peptide (BNP) level   Volume overload   Hypotension   Elevated CK   Abnormal CT scan,  colon   Hematochezia   Intestinal infection due to enteropathogenic E. coli   Acute on chronic blood loss anemia, with ongoing GI bleed, hypotension : -Patient presented with clots with large BM, Eliquis was placed on hold -Hemoglobin down to 6.2 on 3/19, Has received 4 units PRBC so far. Hb remained stable -Patient has had several BMs with thick dark clots since then. - started PPI drip, consulted with Gastroenterology -Unable to get CTA abdomen due to renal dysfunction, creatinine 4.76.   -Currently not stable for tagged RBC scan with hypotension. - Kcentra was given for Eliquis reversal, last Eliquis dose  3/19 - CCM consulted for hemorrhagic shock, BP was in 80s. Now improved,. 3/21: Hemoglobin 7.5.  No further bleeding noted. GI recommended monitor H&H, continue PPI every 12h,  bleeding has stopped,  notify GI if has further bleeding. 3/22 : Repeat CT abdomen shows thickening in the descending colon. 3/23: Hemoglobin remained stable.  GI recommended resuming heparin without bolus on 3/ 25, transition to Eliquis on 3/26, if hemoglobin remains stable, consider discharge planning.  High risk colonoscopy if he bleeds. 3/25: Heparin resumed, hemoglobin remained stable 7.6. 3/26: Transitioned to Eliquis.  Hemoglobin dropped from 7.4->6.8.  Patient denies any blood in the stools. 3/27: GI reconsulted.  GI states colonoscopy would be high risk and not recommended.  Hold Eliquis for now,  rechallenge with heparin tomorrow or next day.  The risks and benefits of long-term anticoagulation need to be discussed.  Monitor H&H.  Acute kidney injury: > Improving -Likely secondary from ATN, septic shock and rhabdomyolysis -Initially treated with IV fluids, subsequently with intermittent Lasix.  -Nephrology following, creatinine appears  to be trending down -Creatinine improving, 1.35 today, AKI improving. -CK has trended down. -Renal ultrasound negative for obstruction, continue to hold losartan,  avoid nephrotoxins -Renal functions improving,  Continue to monitor closely.  Acute hypoxic and hypercarbic respiratory failure: sec. to acute COPD exacerbation - On 3/17, patient found to be in hypercarbic respiratory failure, lethargic not responding to verbal commands.   - ABG showed pH of 7.2, PCO2 70.1.  Overnight patient had refused BiPAP. - Placed on BiPAP, alert and oriented this morning - 2D echo showed EF of 55 to 60%.  - Patient needs to have BiPAP QHS and PRN.   Acute metabolic encephalopathy superimposed on dementia, myoclonic jerks :> Resolved. -Likely due to hypercarbic respiratory failure, now improved, alert and oriented now.  -Patient follows neurology outpatient, Dr. [name].  -CT head negative for acute intracranial pathology -Melatonin discontinued, avoid sedating meds, BiPAP nightly -Patient had EEG during admission, negative for seizures.  - MRI was initially deferred due to rapid improvement in his mental status. - Continue Namenda, Aricept, fall precautions  Septic shock, present on admission.  Rotavirus and enteropathogenic E. coli diarrhea > Resolved. -Initially treated with aggressive IV fluid hydration, broad-spectrum antibiotics. -Also received empiric antibiotics for meningitis due to altered mental status, DC'd on 07/27/2020. -Blood cultures, urine cultures negative, Covid/flu negative, C. difficile negative -GI PCR was positive for rotavirus/enteropathogenic E. coli, received a dose of Zithromax on 07/28/2020 -Antibiotics have been subsequently discontinued.     History of stage I right lung cancer status post XRT -Outpatient follow-up with oncology  Paroxysmal atrial fibrillation : -Rate controlled, digoxin on hold -Continue to Hold Eliquis. -Heparin resumed on 3/25, transitioned to Eliquis on 3/26, Eliquis discontinued 3/27.  Leukocytosis -Resolved  Thrombocytopenia: Resolved. monitor platelets  GERD -Continue  PPI.  Hypomagnesemia: Replacement in progress.   Obesity Estimated body mass index is 29.74 kg/m as calculated from the following:   Height as of this encounter: 6' (1.829 m).   Weight as of this encounter: 99.5 kg.   DVT prophylaxis: SCDs Code Status: DNR Family Communication: No family at bed side, Disposition Plan:   Status is: Inpatient  Remains inpatient appropriate because:Inpatient level of care appropriate due to severity of illness   Dispo: The patient is from: Home              Anticipated d/c is to: SNF              Patient currently is not medically stable to d/c.   Difficult to place patient No   Consultants:   Gastroenterology  Procedures: 2D Echo  Antimicrobials:     Anti-infectives (From admission, onward)   Start     Dose/Rate Route Frequency Ordered Stop   07/28/20 0845  azithromycin (ZITHROMAX) tablet 1,000 mg        1,000 mg Oral  Once 07/28/20 0747 07/28/20 0827   07/28/20 0608  cefTRIAXone (ROCEPHIN) 2 g in sodium chloride 0.9 % 100 mL IVPB  Status:  Discontinued        2 g 200 mL/hr over 30 Minutes Intravenous Every 24 hours 07/27/20 1447 07/28/20 0747   07/27/20 1045  metroNIDAZOLE (FLAGYL) IVPB 500 mg        500 mg 100 mL/hr over 60 Minutes Intravenous  Once 07/27/20 0956 07/27/20 1113   07/26/20 1930  acyclovir (ZOVIRAX) 775 mg in dextrose 5 % 150 mL IVPB  Status:  Discontinued        10 mg/kg  77.6 kg (Ideal)  165.5 mL/hr over 60 Minutes Intravenous Every 24 hours 07/26/20 1848 07/27/20 1424   07/26/20 1915  ampicillin (OMNIPEN) 2 g in sodium chloride 0.9 % 100 mL IVPB  Status:  Discontinued        2 g 300 mL/hr over 20 Minutes Intravenous Every 8 hours 07/26/20 1848 07/27/20 1424   07/26/20 1900  cefTRIAXone (ROCEPHIN) 2 g in sodium chloride 0.9 % 100 mL IVPB  Status:  Discontinued        2 g 200 mL/hr over 30 Minutes Intravenous Every 12 hours 07/26/20 1846 07/27/20 1447   07/26/20 1851  vancomycin variable dose per unstable renal  function (pharmacist dosing)  Status:  Discontinued         Does not apply See admin instructions 07/26/20 1853 07/27/20 1424   07/26/20 1815  ceFEPIme (MAXIPIME) 2 g in sodium chloride 0.9 % 100 mL IVPB  Status:  Discontinued        2 g 200 mL/hr over 30 Minutes Intravenous  Once 07/26/20 1802 07/26/20 1846   07/26/20 1815  metroNIDAZOLE (FLAGYL) IVPB 500 mg        500 mg 100 mL/hr over 60 Minutes Intravenous  Once 07/26/20 1802 07/26/20 2049   07/26/20 1815  vancomycin (VANCOREADY) IVPB 1750 mg/350 mL        1,750 mg 175 mL/hr over 120 Minutes Intravenous  Once 07/26/20 1802 07/26/20 2228     Subjective: Patient was seen and examined at bedside.  Overnight events noted. Patient appears depressed,  states his hemoglobin had dropped, states he is scared to have colonoscopy. He reports cough and shortness of breath has improved,  he denies any blood in the stools. He is getting blood transfusion.  Hemoglobin has dropped from 7.4-6.8   Objective: Vitals:   08/15/20 0800 08/15/20 0900 08/15/20 0921 08/15/20 0930  BP:  129/78    Pulse:      Resp:      Temp:    98.2 F (36.8 C)  TempSrc:    Oral  SpO2: 93%  98%   Weight:      Height:        Intake/Output Summary (Last 24 hours) at 08/15/2020 1100 Last data filed at 08/15/2020 0930 Gross per 24 hour  Intake 367.5 ml  Output 2400 ml  Net -2032.5 ml   Filed Weights   08/13/20 0500 08/14/20 0446 08/15/20 0500  Weight: 99.5 kg 96.4 kg 95.5 kg    Examination:  General exam: Appears calm and comfortable, not in any acute distress. Respiratory system: Clear to auscultation. Respiratory effort normal. Cardiovascular system: S1 & S2 heard, RRR. No JVD, murmurs, rubs, gallops or clicks. No pedal edema. Gastrointestinal system: Abdomen is nondistended, soft and nontender. No organomegaly or masses felt.  Normal bowel sounds heard. Central nervous system: Alert and oriented. No focal neurological deficits. Extremities: Symmetric 5 x  5 power. No edema, no cyanosis, no clubbing. Skin: No rashes, lesions or ulcers Psychiatry: Judgement and insight appear normal. Mood & affect appropriate.     Data Reviewed: I have personally reviewed following labs and imaging studies  CBC: Recent Labs  Lab 08/09/20 0122 08/09/20 1012 08/10/20 0129 08/10/20 0731 08/11/20 0306 08/12/20 0347 08/13/20 0235 08/14/20 0510 08/15/20 0026  WBC 4.8  --  4.6  --   --   --   --  6.4 4.0  HGB 7.1*   < > 7.1*   < > 7.1* 7.0* 7.5* 7.4* 6.8*  HCT 21.9*   < >  22.9*   < > 22.8* 22.9* 23.9* 23.7* 22.8*  MCV 86.6  --  89.5  --   --   --   --  91.9 95.8  PLT 203  --  209  --   --   --   --  199 180   < > = values in this interval not displayed.   Basic Metabolic Panel: Recent Labs  Lab 08/10/20 0129 08/11/20 0306 08/12/20 0347 08/13/20 0235 08/14/20 0510 08/15/20 0026  NA 143 141 143 142 143 142  K 3.5 3.3* 3.9 4.2 3.6 3.6  CL 104 101 104 101 97* 96*  CO2 32 34* 35* 35* 38* 41*  GLUCOSE 100* 100* 96 97 122* 98  BUN 54* 41* 32* 25* 23 20  CREATININE 2.21* 1.77* 1.56* 1.44* 1.29* 1.35*  CALCIUM 7.5* 7.7* 8.1* 8.3* 8.5* 8.0*  MG 1.1* 1.2* 1.3* 1.3*  --  1.3*  PHOS 5.0* 4.6 4.0 3.0  --  3.7   GFR: Estimated Creatinine Clearance: 52.3 mL/min (A) (by C-G formula based on SCr of 1.35 mg/dL (H)). Liver Function Tests: Recent Labs  Lab 08/09/20 0336  AST 41  ALT 40  ALKPHOS 99  BILITOT 0.6  PROT 4.6*  ALBUMIN 2.4*   No results for input(s): LIPASE, AMYLASE in the last 168 hours. No results for input(s): AMMONIA in the last 168 hours. Coagulation Profile: No results for input(s): INR, PROTIME in the last 168 hours. Cardiac Enzymes: No results for input(s): CKTOTAL, CKMB, CKMBINDEX, TROPONINI in the last 168 hours. BNP (last 3 results) No results for input(s): PROBNP in the last 8760 hours. HbA1C: No results for input(s): HGBA1C in the last 72 hours. CBG: Recent Labs  Lab 08/12/20 2314 08/13/20 0314 08/13/20 0741  08/13/20 1224 08/13/20 1605  GLUCAP 95 102* 88 137* 103*   Lipid Profile: No results for input(s): CHOL, HDL, LDLCALC, TRIG, CHOLHDL, LDLDIRECT in the last 72 hours. Thyroid Function Tests: No results for input(s): TSH, T4TOTAL, FREET4, T3FREE, THYROIDAB in the last 72 hours. Anemia Panel: No results for input(s): VITAMINB12, FOLATE, FERRITIN, TIBC, IRON, RETICCTPCT in the last 72 hours. Sepsis Labs: No results for input(s): PROCALCITON, LATICACIDVEN in the last 168 hours.  No results found for this or any previous visit (from the past 240 hour(s)).    Radiology Studies: No results found. Scheduled Meds: . sodium chloride   Intravenous Once  . acetaminophen  650 mg Oral TID  . budesonide (PULMICORT) nebulizer solution  0.5 mg Nebulization BID  . calcium carbonate  1 tablet Oral Q breakfast  . chlorhexidine  15 mL Mouth Rinse BID  . Chlorhexidine Gluconate Cloth  6 each Topical Q0600  . dextromethorphan-guaiFENesin  1 tablet Oral BID  . dicyclomine  10 mg Oral TID AC  . donepezil  5 mg Oral QHS  . feeding supplement  1 Container Oral TID WC  . furosemide  40 mg Intravenous Daily  . mouth rinse  15 mL Mouth Rinse q12n4p  . memantine  5 mg Oral BID  . pantoprazole  40 mg Oral Daily   Continuous Infusions: . sodium chloride Stopped (08/15/20 0943)  . lactated ringers 10 mL/hr at 07/30/20 1800     LOS: 20 days    Time spent: 25 mins    Jaanvi Fizer, MD [facility]   If 7PM-7AM, please contact night-coverage

## 2020-08-15 NOTE — Progress Notes (Addendum)
Orders to transfuse one unit of blood; per bloodbank, pt's unit of blood needs to be irradiated which will cause a delay in unit being ready; will transfuse unit as soon as ready; MD aware

## 2020-08-15 NOTE — Progress Notes (Signed)
RT placed patient on BIPAP HS. Patient tolerating well at this time.  

## 2020-08-16 DIAGNOSIS — J9602 Acute respiratory failure with hypercapnia: Secondary | ICD-10-CM | POA: Diagnosis not present

## 2020-08-16 LAB — TYPE AND SCREEN
ABO/RH(D): B POS
Antibody Screen: NEGATIVE
Unit division: 0

## 2020-08-16 LAB — CBC
HCT: 26.6 % — ABNORMAL LOW (ref 39.0–52.0)
Hemoglobin: 8 g/dL — ABNORMAL LOW (ref 13.0–17.0)
MCH: 28.7 pg (ref 26.0–34.0)
MCHC: 30.1 g/dL (ref 30.0–36.0)
MCV: 95.3 fL (ref 80.0–100.0)
Platelets: 185 10*3/uL (ref 150–400)
RBC: 2.79 MIL/uL — ABNORMAL LOW (ref 4.22–5.81)
RDW: 19.3 % — ABNORMAL HIGH (ref 11.5–15.5)
WBC: 5 10*3/uL (ref 4.0–10.5)
nRBC: 0 % (ref 0.0–0.2)

## 2020-08-16 LAB — BASIC METABOLIC PANEL
Anion gap: 4 — ABNORMAL LOW (ref 5–15)
BUN: 20 mg/dL (ref 8–23)
CO2: 43 mmol/L — ABNORMAL HIGH (ref 22–32)
Calcium: 8.2 mg/dL — ABNORMAL LOW (ref 8.9–10.3)
Chloride: 94 mmol/L — ABNORMAL LOW (ref 98–111)
Creatinine, Ser: 1.25 mg/dL — ABNORMAL HIGH (ref 0.61–1.24)
GFR, Estimated: 58 mL/min — ABNORMAL LOW (ref >60)
Glucose, Bld: 89 mg/dL (ref 70–99)
Potassium: 3.7 mmol/L (ref 3.5–5.1)
Sodium: 141 mmol/L (ref 135–145)

## 2020-08-16 LAB — BPAM RBC
Blood Product Expiration Date: 202204182359
ISSUE DATE / TIME: 202203270647
Unit Type and Rh: 7300

## 2020-08-16 LAB — HEPARIN LEVEL (UNFRACTIONATED): Heparin Unfractionated: 0.52 IU/mL (ref 0.30–0.70)

## 2020-08-16 LAB — MAGNESIUM: Magnesium: 1.3 mg/dL — ABNORMAL LOW (ref 1.7–2.4)

## 2020-08-16 LAB — APTT: aPTT: 83 seconds — ABNORMAL HIGH (ref 24–36)

## 2020-08-16 LAB — PHOSPHORUS: Phosphorus: 2.8 mg/dL (ref 2.5–4.6)

## 2020-08-16 MED ORDER — METOCLOPRAMIDE HCL 5 MG/ML IJ SOLN
10.0000 mg | Freq: Once | INTRAMUSCULAR | Status: AC
  Administered 2020-08-17: 10 mg via INTRAVENOUS
  Filled 2020-08-16: qty 2

## 2020-08-16 MED ORDER — HEPARIN (PORCINE) 25000 UT/250ML-% IV SOLN
1300.0000 [IU]/h | INTRAVENOUS | Status: AC
  Administered 2020-08-16 – 2020-08-17 (×2): 1350 [IU]/h via INTRAVENOUS
  Administered 2020-08-17: 1300 [IU]/h via INTRAVENOUS
  Filled 2020-08-16 (×4): qty 250

## 2020-08-16 MED ORDER — PEG-KCL-NACL-NASULF-NA ASC-C 100 G PO SOLR: 1.0000 | Freq: Once | ORAL | Status: DC

## 2020-08-16 MED ORDER — PEG-KCL-NACL-NASULF-NA ASC-C 100 G PO SOLR
0.5000 | Freq: Once | ORAL | Status: AC
  Administered 2020-08-17: 100 g via ORAL
  Filled 2020-08-16: qty 1

## 2020-08-16 MED ORDER — DICYCLOMINE HCL 10 MG PO CAPS
10.0000 mg | ORAL_CAPSULE | Freq: Four times a day (QID) | ORAL | Status: DC | PRN
  Administered 2020-08-16 – 2020-08-19 (×2): 10 mg via ORAL
  Filled 2020-08-16 (×3): qty 1

## 2020-08-16 MED ORDER — BISACODYL 5 MG PO TBEC
20.0000 mg | DELAYED_RELEASE_TABLET | Freq: Every day | ORAL | Status: AC
  Administered 2020-08-16 – 2020-08-17 (×2): 20 mg via ORAL
  Filled 2020-08-16 (×2): qty 4

## 2020-08-16 MED ORDER — PEG-KCL-NACL-NASULF-NA ASC-C 100 G PO SOLR
0.5000 | Freq: Once | ORAL | Status: DC
  Filled 2020-08-16 (×2): qty 1

## 2020-08-16 MED ORDER — POLYETHYLENE GLYCOL 3350 17 G PO PACK
17.0000 g | PACK | Freq: Once | ORAL | Status: AC
  Filled 2020-08-16: qty 1

## 2020-08-16 MED ORDER — MAGNESIUM SULFATE 4 GM/100ML IV SOLN
4.0000 g | Freq: Once | INTRAVENOUS | Status: AC
  Administered 2020-08-16: 4 g via INTRAVENOUS
  Filled 2020-08-16: qty 100

## 2020-08-16 NOTE — Progress Notes (Signed)
Pt stated he wanted to stay off bipap tonight. Pt made aware if he starts feeling SOB to let us know. RT will continue to monitor as needed.

## 2020-08-16 NOTE — Progress Notes (Signed)
ANTICOAGULATION CONSULT NOTE   Pharmacy Consult:  Heparin Indication: atrial fibrillation   Patient Measurements: Height: 6' (182.9 cm) Weight: 95.5 kg (210 lb 8.6 oz) IBW/kg (Calculated) : 77.6 Heparin Dosing Weight: 97 kg  Vital Signs: Temp: 98.7 F (37.1 C) (03/28 1605) Temp Source: Oral (03/28 1605) BP: 149/84 (03/28 1605) Pulse Rate: 105 (03/28 1651)  Labs: Recent Labs    08/13/20 2227 08/14/20 0510 08/14/20 0510 08/14/20 0807 08/15/20 0026 08/15/20 1135 08/16/20 0256 08/16/20 1926  HGB  --  7.4*   < >  --  6.8* 8.2* 8.0*  --   HCT  --  23.7*   < >  --  22.8* 27.0* 26.6*  --   PLT  --  199  --   --  180  --  185  --   APTT  --   --   --   --   --   --   --  83*  HEPARINUNFRC 0.14*  --   --  0.25*  --   --   --  0.52  CREATININE  --  1.29*  --   --  1.35*  --  1.25*  --   TROPONINIHS  --  38*  --   --   --   --   --   --    < > = values in this interval not displayed.    Estimated Creatinine Clearance: 56.5 mL/min (A) (by C-G formula based on SCr of 1.25 mg/dL (H)).   Medical History: COPD, stage 1 lung cancer 2021, dementia, A fib on Eliquis, GERD, leukemia   Assessment: 32 YOM with history of Afib on Eliquis PTA, presented s/p fall.  Patient was transitioned to IV heparin and then Eliquis was resumed.  He developed GIB with hypotension and received KCentra on 08/06/20.  Now stable and Pharmacy consulted to resume [facility] with IV heparin.    Last Eliquis dose was on 3/26 PM. Heparin level 0.52 - just slightly above goal of 0.3 to 0.5 (but may have been impacted by Eliquis doses on 3/26).  APTT 83 - at goal. Will continue current rate based on aPTT Hgb 8 - stable. Platelets stable.  No bleeding reported.   Goal of Therapy:  APTT 66-85 Heparin level 0.3-0.5 units/ml Monitor platelets by anticoagulation protocol: Yes   Plan:  Continue heparin 1350 units/hr  Daily heparin level, APTT, and CBC  Sloan Leiter, PharmD, BCPS, BCCCP Clinical Pharmacist Please  refer to [facility] for [place] numbers 08/16/2020 9:25 PM

## 2020-08-16 NOTE — Progress Notes (Signed)
   Palliative Medicine Inpatient Follow Up Note   Palliative care communicated with Dr. Dwyane Dee this morning to inquire as to whether we should intervene given patient's ongoing need for blood transfusions and inability to get advanced interventions in the setting of his risk facotrs.   Per our conversation Dr. Dwyane Dee does not feel that at this point that we need to be involved in Todd Lloyd's case. I shared with him that we had completed a MOST form and started conversations whereby the goals are clear.   We will sign off. Please re-consult if additional Palliative input or support is needed moving forward.  No Charge ______________________________________________________________________________________ [place] Team Team Cell Phone: [phone] Please utilize secure chat with additional questions, if there is no response within 30 minutes please call the above phone number  Palliative Medicine Team providers are available by phone from 7am to 7pm daily and can be reached through the team cell phone.  Should this patient require assistance outside of these hours, please call the patient's attending physician.

## 2020-08-16 NOTE — Progress Notes (Addendum)
Daily Rounding Note  08/16/2020, 8:48 AM  LOS: 21 days   SUBJECTIVE:   Chief complaint: R sided colitis, painless hematochezia.    Blood counts dropped in last 2 days and required another PRBC No dark, no bloody stools.  Last BM was   OBJECTIVE:         Vital signs in last 24 hours:    Temp:  [97.7 F (36.5 C)-98.4 F (36.9 C)] 97.8 F (36.6 C) (03/28 0803) Pulse Rate:  [85-103] 91 (03/28 0803) Resp:  [14-20] 17 (03/28 0803) BP: (123-140)/(64-78) 128/64 (03/28 0803) SpO2:  [94 %-98 %] 98 % (03/28 0803) FiO2 (%):  [28 %] 28 % (03/27 0921) Weight:  [95.5 kg] 95.5 kg (03/28 0601) Last BM Date: 08/15/20 Filed Weights   08/14/20 0446 08/15/20 0500 08/16/20 0601  Weight: 96.4 kg 95.5 kg 95.5 kg   General: Looks chronically ill, frail.  Alert.  Comfortable Heart: Irregularly irregular, heart rate in the 80s. Chest: Phlegmy cough and vocal quality.  No dyspnea at rest.  Greatly reduced breath sounds globally. Abdomen: Soft.  Slight tenderness on the right lower quadrant.  No masses, HSM.  Active bowel sounds Extremities: Slight lower extremity edema. Neuro/Psych: Appropriate.  Alert.  Moves all 4 limbs.  Able to sit up in bed without assistance.  Intake/Output from previous day: 03/27 0701 - 03/28 0700 In: 1197.5 [P.O.:420; I.V.:410; Blood:367.5] Out: 2600 [Urine:2600]  Intake/Output this shift: No intake/output data recorded.  Lab Results: Recent Labs    08/14/20 0510 08/15/20 0026 08/15/20 1135 08/16/20 0256  WBC 6.4 4.0  --  5.0  HGB 7.4* 6.8* 8.2* 8.0*  HCT 23.7* 22.8* 27.0* 26.6*  PLT 199 180  --  185   BMET Recent Labs    08/14/20 0510 08/15/20 0026 08/16/20 0256  NA 143 142 141  K 3.6 3.6 3.7  CL 97* 96* 94*  CO2 38* 41* 43*  GLUCOSE 122* 98 89  BUN 23 20 20   CREATININE 1.29* 1.35* 1.25*  CALCIUM 8.5* 8.0* 8.2*   LFT No results for input(s): PROT, ALBUMIN, AST, ALT, ALKPHOS, BILITOT,  BILIDIR, IBILI in the last 72 hours. PT/INR No results for input(s): LABPROT, INR in the last 72 hours. Hepatitis Panel No results for input(s): HEPBSAG, HCVAB, HEPAIGM, HEPBIGM in the last 72 hours.  Studies/Results: No results found.   Scheduled Meds: . sodium chloride   Intravenous Once  . acetaminophen  650 mg Oral TID  . budesonide (PULMICORT) nebulizer solution  0.5 mg Nebulization BID  . calcium carbonate  1 tablet Oral Q breakfast  . chlorhexidine  15 mL Mouth Rinse BID  . Chlorhexidine Gluconate Cloth  6 each Topical Q0600  . dextromethorphan-guaiFENesin  1 tablet Oral BID  . dicyclomine  10 mg Oral TID AC  . donepezil  5 mg Oral QHS  . feeding supplement  1 Container Oral TID WC  . furosemide  40 mg Intravenous Daily  . mouth rinse  15 mL Mouth Rinse q12n4p  . memantine  5 mg Oral BID  . pantoprazole  40 mg Oral Daily   Continuous Infusions: . sodium chloride Stopped (08/15/20 0943)  . lactated ringers 10 mL/hr at 07/30/20 1800  . magnesium sulfate bolus IVPB     PRN Meds:.albuterol, Gerhardt's butt cream, guaiFENesin, loperamide, nitroGLYCERIN, polyethylene glycol   ASSESMENT:   *   Painless hematochezia in the setting of blood thinners. Right sided colitis on CT.  Stool tested  positive for enteropathogenic E. coli and rotavirus early on. Over recent days restarted on IV heparin  *   Anemia.  Hgb 6.8 >> 8. Received 5 PRBCs thus far.  The last, 5th unit, was given on 3/27.  *   Chronic Eliquis for A. fib.  Was held in the setting of hematochezia. Heparin restarted 3/25 -3/26, then discontinued due to Hgb decline without GI bleeding   PLAN   *   Switch to prn Bentyl.    *   Give dose of MIralax now.  *   Agree w restarting IV Heparin.    *   No plans for colonoscopy    Todd Lloyd  08/16/2020, 8:48 AM Phone [phone]    Attending physician's note   I have taken an interval history, reviewed the chart and examined the patient. I agree with  the Advanced Practitioner's note, impression and recommendations.   Very difficult situation.   Extensive discussion with daughter Todd Lloyd Investment banker, corporate) over 30 min.  She would like to get colon performed.  She was told by Dr. Rush Landmark that it could be attempted-although very high risk.  Recurrent painless hematochezia on AC for A Fib (eliquis, then heparin), S/P 5U PRBC since adm. CT showed R sided colitis. Stool prev + EPEC and rotavirus, Rx with azithromycin in past.  Patient with multiple comorbid conditions including advanced COPD, chr respiratory failure requiring BiPAP.  Plan: -After discussion with daughter (Retd RN), we have reached the consensus to have anesthesia consultation to determine if pt is a candidate for sedation.  Daughter would like to talk to anesthesia consultant.  If he is, then would proceed with colonoscopy after holding heparin for 6 hours prior on 3/30.  Otherwise would recommend conservative management. Daughter understands the risks including risks of dying during colonoscopy.  Note that the patient is DNR which has to be revoked for the duration of procedure (if needed)  -For now trend CBC.   Carmell Austria, MD [facility] [phone]

## 2020-08-16 NOTE — Progress Notes (Addendum)
  Please note that the patient will be having a colonoscopy and endoscopy sometime on Wednesday in the morning, exact time TBD. GI will terminate the IV heparin to allow for the procedures 4 to 5 hours prior to procedures.  Please do not start oral anticoagulation until after these procedures.   Azucena Freed PA-C

## 2020-08-16 NOTE — Progress Notes (Signed)
Pt was started on heparin this afternoon. Uneventful day Pt rested well throughout the day.

## 2020-08-16 NOTE — Progress Notes (Signed)
PROGRESS NOTE    Todd Lloyd  PPJ:[id number] DOB: 28-May-1940 DOA: 07/26/2020 PCP: Haywood Pao, MD    Brief Narrative:  This 80 year old male with history of COPD, stage I non-small cell lung cancer s/p radiation in 04/2020, dementia, paroxysmal A. fib on Eliquis, GERD, leukemia s/p bone marrow transplantation in 1990 was admitted to ICU under PCCM service with altered mental status after a fall and diarrhea. He was found to be in shock / respiratory failure requiring BiPAP, acute kidney injury, rhabdomyolysis.  CT of the head and C-spine were negative for acute abnormalities. CT of the abdomen and pelvis showed focal colitis without evidence of perforation. Neurology and nephrology were consulted. He was treated with bicarb drip. EEG was negative for seizures. He was treated with broad-spectrum antibiotics initially; stool was positive for rotavirus and enteropathogenic E. Coli, received Zithromax. He was treated with intravenous fluids initially. Urine output has been minimal and he has been getting intermittent Lasix recently by nephrology. Mental status has improved; Neurology signed off. Patient was transferred to [facility] service on 07/31/2020. GI has been consulted for acute on chronic blood loss anemia.  Patient has had several bowel movement with thick dark blood clots.  Patient has received so far 4 units of PRBCs.  Hemoglobin today 7.5,  seems stable.  Monitor H&H.  May need intervention if He continues have bleeding.  Assessment & Plan:   Principal Problem:   Acute hypercapnic respiratory failure (HCC) Active Problems:   Chronic atrial fibrillation (HCC)   COPD (chronic obstructive pulmonary disease) (HCC)   Chronic anticoagulation   Solitary pulmonary nodule   SIRS (systemic inflammatory response syndrome) (HCC)   AKI (acute kidney injury) (HCC)   Elevated brain natriuretic peptide (BNP) level   Volume overload   Hypotension   Elevated CK   Abnormal CT scan,  colon   Hematochezia   Intestinal infection due to enteropathogenic E. coli   Acute on chronic blood loss anemia, with ongoing GI bleed, hypotension : -Patient presented with clots with large BM, Eliquis was placed on hold -Hemoglobin down to 6.2 on 3/19, Has received 5 units PRBC so far. Hb remained stable -Patient has had several BMs with thick dark clots since then. - started PPI drip, consulted with Gastroenterology -Unable to get CTA abdomen due to renal dysfunction, creatinine 4.76.   -Currently not stable for tagged RBC scan with hypotension. - Kcentra was given for Eliquis reversal, last Eliquis dose  3/19 - CCM consulted for hemorrhagic shock, BP was in 80s. Now improved,. 3/21: Hemoglobin 7.5.  No further bleeding noted. GI recommended monitor H&H, continue PPI every 12h,  bleeding has stopped,  notify GI if has further bleeding. 3/22 : Repeat CT abdomen shows thickening in the descending colon. 3/23: Hemoglobin remained stable.  GI recommended resuming heparin without bolus on 3/ 25, transition to Eliquis on 3/26, if hemoglobin remains stable, consider discharge planning.  High risk colonoscopy if he bleeds. 3/25: Heparin resumed, hemoglobin remained stable 7.6. 3/26: Transitioned to Eliquis.  Hemoglobin dropped from 7.4->6.8.  Patient denies any blood in the stools. 3/27: GI reconsulted.  GI states colonoscopy would be high risk and not recommended.  Hold Eliquis for now,  rechallenge with heparin tomorrow or next day.  The risks and benefits of long-term anticoagulation need to be discussed.  Monitor H&H. 3/28: Heparin resumed without bolus. monitor of GI bleeding  Acute kidney injury: > Improving -Likely secondary from ATN, septic shock and rhabdomyolysis -Initially treated with IV fluids,  subsequently with intermittent Lasix.  -Nephrology following, creatinine appears to be trending down -Creatinine improving, 1.25 today, AKI improving. -CK has trended down. -Renal  ultrasound negative for obstruction, continue to hold losartan, avoid nephrotoxins -Renal functions improving,  Continue to monitor closely.  Acute hypoxic and hypercarbic respiratory failure: sec. to acute COPD exacerbation - On 3/17, patient found to be in hypercarbic respiratory failure, lethargic not responding to verbal commands.   - ABG showed pH of 7.2, PCO2 70.1.  Overnight patient had refused BiPAP. - Placed on BiPAP, alert and oriented this morning - 2D echo showed EF of 55 to 60%.  - Continue BiPAP QHS and PRN.   Acute metabolic encephalopathy superimposed on dementia, myoclonic jerks :> Resolved. -Likely due to hypercarbic respiratory failure, now improved, alert and oriented now.  -Patient follows neurology outpatient, Dr. [name].  -CT head negative for acute intracranial pathology -Melatonin discontinued, avoid sedating meds, BiPAP nightly -Patient had EEG during admission, negative for seizures.  - MRI was initially deferred due to rapid improvement in his mental status. - Continue Namenda, Aricept, fall precautions  Septic shock, present on admission.  Rotavirus and enteropathogenic E. coli diarrhea > Resolved. -Initially treated with aggressive IV fluid hydration, broad-spectrum antibiotics. -Also received empiric antibiotics for meningitis due to altered mental status, DC'd on 07/27/2020. -Blood cultures, urine cultures negative, Covid/flu negative, C. difficile negative -GI PCR was positive for rotavirus/enteropathogenic E. coli, received a dose of Zithromax on 07/28/2020 -Antibiotics have been subsequently discontinued.     History of stage I right lung cancer status post XRT -Outpatient follow-up with oncology  Paroxysmal atrial fibrillation : -Rate controlled, digoxin on hold -Continue to Hold Eliquis. -Heparin resumed on 3/25, transitioned to Eliquis on 3/26, Eliquis discontinued 3/27. - 3/28: Heparin  resumed.  Leukocytosis -Resolved  Thrombocytopenia: Resolved. monitor platelets  GERD -Continue PPI.  Hypomagnesemia: Replacement in progress. MagSO4 4 mg x 1   Obesity Estimated body mass index is 29.74 kg/m as calculated from the following:   Height as of this encounter: 6' (1.829 m).   Weight as of this encounter: 99.5 kg.   DVT prophylaxis: SCDs Code Status: DNR Family Communication: No family at bed side, Disposition Plan:   Status is: Inpatient  Remains inpatient appropriate because:Inpatient level of care appropriate due to severity of illness   Dispo: The patient is from: Home              Anticipated d/c is to: SNF              Patient currently is not medically stable to d/c.   Difficult to place patient No   Consultants:   Gastroenterology  Procedures: 2D Echo  Antimicrobials:     Anti-infectives (From admission, onward)   Start     Dose/Rate Route Frequency Ordered Stop   07/28/20 0845  azithromycin (ZITHROMAX) tablet 1,000 mg        1,000 mg Oral  Once 07/28/20 0747 07/28/20 0827   07/28/20 0608  cefTRIAXone (ROCEPHIN) 2 g in sodium chloride 0.9 % 100 mL IVPB  Status:  Discontinued        2 g 200 mL/hr over 30 Minutes Intravenous Every 24 hours 07/27/20 1447 07/28/20 0747   07/27/20 1045  metroNIDAZOLE (FLAGYL) IVPB 500 mg        500 mg 100 mL/hr over 60 Minutes Intravenous  Once 07/27/20 0956 07/27/20 1113   07/26/20 1930  acyclovir (ZOVIRAX) 775 mg in dextrose 5 % 150 mL IVPB  Status:  Discontinued        10 mg/kg  77.6 kg (Ideal) 165.5 mL/hr over 60 Minutes Intravenous Every 24 hours 07/26/20 1848 07/27/20 1424   07/26/20 1915  ampicillin (OMNIPEN) 2 g in sodium chloride 0.9 % 100 mL IVPB  Status:  Discontinued        2 g 300 mL/hr over 20 Minutes Intravenous Every 8 hours 07/26/20 1848 07/27/20 1424   07/26/20 1900  cefTRIAXone (ROCEPHIN) 2 g in sodium chloride 0.9 % 100 mL IVPB  Status:  Discontinued        2 g 200 mL/hr over 30  Minutes Intravenous Every 12 hours 07/26/20 1846 07/27/20 1447   07/26/20 1851  vancomycin variable dose per unstable renal function (pharmacist dosing)  Status:  Discontinued         Does not apply See admin instructions 07/26/20 1853 07/27/20 1424   07/26/20 1815  ceFEPIme (MAXIPIME) 2 g in sodium chloride 0.9 % 100 mL IVPB  Status:  Discontinued        2 g 200 mL/hr over 30 Minutes Intravenous  Once 07/26/20 1802 07/26/20 1846   07/26/20 1815  metroNIDAZOLE (FLAGYL) IVPB 500 mg        500 mg 100 mL/hr over 60 Minutes Intravenous  Once 07/26/20 1802 07/26/20 2049   07/26/20 1815  vancomycin (VANCOREADY) IVPB 1750 mg/350 mL        1,750 mg 175 mL/hr over 120 Minutes Intravenous  Once 07/26/20 1802 07/26/20 2228     Subjective: Patient was seen and examined at bedside.  Overnight events noted. Patient reports feeling frustrated with dropping Hb, denies bowel movement in last two days.  He reports cough and shortness of breath has improved,  he denies any blood in the stools. He has received blood transfusion yesterday, Hb remains above 8.0 gm.   Objective: Vitals:   08/16/20 0420 08/16/20 0601 08/16/20 0739 08/16/20 0803  BP:    128/64  Pulse: 86  87 91  Resp: 20  16 17   Temp:    97.8 F (36.6 C)  TempSrc:    Oral  SpO2: 97%  98% 98%  Weight:  95.5 kg    Height:        Intake/Output Summary (Last 24 hours) at 08/16/2020 1153 Last data filed at 08/16/2020 1144 Gross per 24 hour  Intake 780 ml  Output 1801 ml  Net -1021 ml   Filed Weights   08/14/20 0446 08/15/20 0500 08/16/20 0601  Weight: 96.4 kg 95.5 kg 95.5 kg    Examination:  General exam: Appears calm and comfortable, not in any acute distress. Respiratory system: Clear to auscultation. Respiratory effort normal. Cardiovascular system: S1 & S2 heard, RRR. No JVD, murmurs, rubs, gallops or clicks. No pedal edema. Gastrointestinal system: Abdomen is nondistended, soft and nontender. No organomegaly or masses felt.   Normal bowel sounds heard. Central nervous system: Alert and oriented. No focal neurological deficits. Extremities: Symmetric 5 x 5 power. No edema, no cyanosis, no clubbing. Skin: No rashes, lesions or ulcers Psychiatry: Judgement and insight appear normal. Mood & affect appropriate.     Data Reviewed: I have personally reviewed following labs and imaging studies  CBC: Recent Labs  Lab 08/10/20 0129 08/10/20 0731 08/13/20 0235 08/14/20 0510 08/15/20 0026 08/15/20 1135 08/16/20 0256  WBC 4.6  --   --  6.4 4.0  --  5.0  HGB 7.1*   < > 7.5* 7.4* 6.8* 8.2* 8.0*  HCT 22.9*   < >  23.9* 23.7* 22.8* 27.0* 26.6*  MCV 89.5  --   --  91.9 95.8  --  95.3  PLT 209  --   --  199 180  --  185   < > = values in this interval not displayed.   Basic Metabolic Panel: Recent Labs  Lab 08/11/20 0306 08/12/20 0347 08/13/20 0235 08/14/20 0510 08/15/20 0026 08/16/20 0256  NA 141 143 142 143 142 141  K 3.3* 3.9 4.2 3.6 3.6 3.7  CL 101 104 101 97* 96* 94*  CO2 34* 35* 35* 38* 41* 43*  GLUCOSE 100* 96 97 122* 98 89  BUN 41* 32* 25* 23 20 20   CREATININE 1.77* 1.56* 1.44* 1.29* 1.35* 1.25*  CALCIUM 7.7* 8.1* 8.3* 8.5* 8.0* 8.2*  MG 1.2* 1.3* 1.3*  --  1.3* 1.3*  PHOS 4.6 4.0 3.0  --  3.7 2.8   GFR: Estimated Creatinine Clearance: 56.5 mL/min (A) (by C-G formula based on SCr of 1.25 mg/dL (H)). Liver Function Tests: No results for input(s): AST, ALT, ALKPHOS, BILITOT, PROT, ALBUMIN in the last 168 hours. No results for input(s): LIPASE, AMYLASE in the last 168 hours. No results for input(s): AMMONIA in the last 168 hours. Coagulation Profile: No results for input(s): INR, PROTIME in the last 168 hours. Cardiac Enzymes: No results for input(s): CKTOTAL, CKMB, CKMBINDEX, TROPONINI in the last 168 hours. BNP (last 3 results) No results for input(s): PROBNP in the last 8760 hours. HbA1C: No results for input(s): HGBA1C in the last 72 hours. CBG: Recent Labs  Lab 08/12/20 2314  08/13/20 0314 08/13/20 0741 08/13/20 1224 08/13/20 1605  GLUCAP 95 102* 88 137* 103*   Lipid Profile: No results for input(s): CHOL, HDL, LDLCALC, TRIG, CHOLHDL, LDLDIRECT in the last 72 hours. Thyroid Function Tests: No results for input(s): TSH, T4TOTAL, FREET4, T3FREE, THYROIDAB in the last 72 hours. Anemia Panel: No results for input(s): VITAMINB12, FOLATE, FERRITIN, TIBC, IRON, RETICCTPCT in the last 72 hours. Sepsis Labs: No results for input(s): PROCALCITON, LATICACIDVEN in the last 168 hours.  No results found for this or any previous visit (from the past 240 hour(s)).    Radiology Studies: No results found. Scheduled Meds: . sodium chloride   Intravenous Once  . acetaminophen  650 mg Oral TID  . budesonide (PULMICORT) nebulizer solution  0.5 mg Nebulization BID  . calcium carbonate  1 tablet Oral Q breakfast  . chlorhexidine  15 mL Mouth Rinse BID  . Chlorhexidine Gluconate Cloth  6 each Topical Q0600  . dextromethorphan-guaiFENesin  1 tablet Oral BID  . donepezil  5 mg Oral QHS  . feeding supplement  1 Container Oral TID WC  . furosemide  40 mg Intravenous Daily  . mouth rinse  15 mL Mouth Rinse q12n4p  . memantine  5 mg Oral BID  . pantoprazole  40 mg Oral Daily   Continuous Infusions: . sodium chloride Stopped (08/15/20 0943)  . heparin    . lactated ringers 10 mL/hr at 07/30/20 1800     LOS: 21 days    Time spent: 25 mins    Derrika Ruffalo, MD [facility]   If 7PM-7AM, please contact night-coverage

## 2020-08-16 NOTE — Progress Notes (Signed)
ANTICOAGULATION CONSULT NOTE   Pharmacy Consult:  Heparin Indication: atrial fibrillation   Patient Measurements: Height: 6' (182.9 cm) Weight: 95.5 kg (210 lb 8.6 oz) IBW/kg (Calculated) : 77.6 Heparin Dosing Weight: 97 kg  Vital Signs: Temp: 97.8 F (36.6 C) (03/28 0803) Temp Source: Oral (03/28 0803) BP: 128/64 (03/28 0803) Pulse Rate: 91 (03/28 0803)  Labs: Recent Labs    08/13/20 2227 08/14/20 0510 08/14/20 0510 08/14/20 0807 08/15/20 0026 08/15/20 1135 08/16/20 0256  HGB  --  7.4*   < >  --  6.8* 8.2* 8.0*  HCT  --  23.7*   < >  --  22.8* 27.0* 26.6*  PLT  --  199  --   --  180  --  185  HEPARINUNFRC 0.14*  --   --  0.25*  --   --   --   CREATININE  --  1.29*  --   --  1.35*  --  1.25*  TROPONINIHS  --  38*  --   --   --   --   --    < > = values in this interval not displayed.    Estimated Creatinine Clearance: 56.5 mL/min (A) (by C-G formula based on SCr of 1.25 mg/dL (H)).   Medical History: COPD, stage 1 lung cancer 2021, dementia, A fib on Eliquis, GERD, leukemia   Assessment: 52 YOM with history of Afib on Eliquis PTA, presented s/p fall.  Patient was transitioned to IV heparin and then Eliquis was resumed.  He developed GIB with hypotension and received KCentra on 08/06/20.  Now stable and Pharmacy consulted to resume [facility] with IV heparin.    Last Eliquis dose was on 3/17 PM - expect it to be cleared from patient's system. HL this AM is 25 which is slightly subtherapeutic. Hgb 7.4 stable over last three days. Plt 199. No issues with line or bleeding noted today.    Apixaban was put on hold due to a drop in hgb. Plan is to resume IV heparin again today for his afib.. We will target for a low therapeutic goal due his risk for bleeding.   Goal of Therapy:  APTT 66-85 Heparin level 0.3-0.5 units/ml Monitor platelets by anticoagulation protocol: Yes   Plan:  Resume heparin 1350 units/hr  Check 8 hr heparin level/APTT Daily heparin level, APTT, and  CBC  Onnie Boer, PharmD, BCIDP, AAHIVP, CPP Infectious Disease Pharmacist 08/16/2020 11:18 AM

## 2020-08-16 NOTE — TOC Progression Note (Signed)
Transition of Care [facility]) - Progression Note    Patient Details  Name: Todd Lloyd MRN: [id number] Date of Birth: January 15, 1941  Transition of Care [facility]) CM/SW [place], RN Phone Number: [phone]  08/16/2020, 3:32 PM  Clinical Narrative:    TOC continue to follow for discharge disposition. Bed was previously choosen at [facility]. Awaiting patient to be medically stable for discharge.    Expected Discharge Plan: [place] Barriers to Discharge: Continued Medical Work up  Expected Discharge Plan and Services Expected Discharge Plan: [place] Choice: [place] arrangements for the past 2 months: Single Family Home                                       Social Determinants of Health (SDOH) Interventions    Readmission Risk Interventions Readmission Risk Prevention Plan 08/04/2020  Transportation Screening Complete  PCP or Specialist Appt within 5-7 Days Complete  Home Care Screening Complete  Medication Review (RN CM) Referral to Pharmacy  Some recent data might be hidden

## 2020-08-16 NOTE — Progress Notes (Signed)
Physical Therapy Treatment Patient Details Name: Todd Lloyd MRN: [id number] DOB: 1941-04-04 Today's Date: 08/16/2020    History of Present Illness 80 yo admitted 3/7 after fall and remained on floor at home 6 hrs with rhabdomyolysis, acute respiratory failure, AMS, AKI. Hospital course complicated by increased CO2/Bipap, hypotension and GI bleed requiring transfusions PMhx; COPD, lung CA, dementia, Afib, leukemia.    PT Comments    Patient reports he has been doing leg exercises on his own in the bed. Remains easily fatigued and requires 4-5 minutes for recovery (from low of 81% sats to 92% on 2L). Very motivated and discouraged by the global weakness he is experiencing. He is hopeful to get to rehab soon for increased activity.    Follow Up Recommendations  SNF;Supervision/Assistance - 24 hour     Equipment Recommendations  Rolling walker with 5" wheels    Recommendations for Other Services OT consult     Precautions / Restrictions Precautions Precautions: Fall Precaution Comments: watch sats and HR    Mobility  Bed Mobility Overal bed mobility: Needs Assistance Bed Mobility: Supine to Sit     Supine to sit: Supervision;HOB elevated (with rail)     General bed mobility comments: HOB 25 degrees with pt able to transition to EOB without assist    Transfers Overall transfer level: Needs assistance Equipment used: Rolling walker (2 wheeled) Transfers: Sit to/from [facility] Sit to Stand: Min guard         General transfer comment: no cues for hand placement! required minguard assist for safety as pt nervous his legs would not hold him  Ambulation/Gait Ambulation/Gait assistance: Min assist Gait Distance (Feet): 120 Feet Assistive device: Rolling walker (2 wheeled) Gait Pattern/deviations: Step-through pattern;Decreased stride length;Trunk flexed Gait velocity: decreased   General Gait Details: pt with SpO2 84-93% on 2L with gait with  cues for posture and proximity to RW; once seated, sats dropped to 80% with slow recovery and incr to 3L with recovery x 4 minutes to 89% (ultimately recovered to 92% prior to next activity)   Stairs             Wheelchair Mobility    Modified Rankin (Stroke Patients Only)       Balance Overall balance assessment: Needs assistance   Sitting balance-Leahy Scale: Good Sitting balance - Comments: eOB and BSC without UE support   Standing balance support: Bilateral upper extremity supported;During functional activity Standing balance-Leahy Scale: Poor Standing balance comment: reliant on RW                            Cognition Arousal/Alertness: Awake/alert Behavior During Therapy: WFL for tasks assessed/performed Overall Cognitive Status: No family/caregiver present to determine baseline cognitive functioning                                 General Comments: mindful of all lines, tubes; asking appropriate questions and expressing concerns over increasing weakness as he waits to go to SNF/rehab      Exercises General Exercises - Lower Extremity Ankle Circles/Pumps:  (reports he does while in bed) Long Arc Quad: AROM;Both;10 reps;Seated (2-3 second hold) Heel Slides:  (reports he does when in bed) Straight Leg Raises:  (reports he does when in bed) Mini-Sqauts: Both;10 reps;Standing (RW) Other Exercises Other Exercises: Encouraged use of IS with pt reporting he is not getting to 543ml  General Comments        Pertinent Vitals/Pain Pain Assessment: No/denies pain    Home Living                      Prior Function            PT Goals (current goals can now be found in the care plan section) Acute Rehab PT Goals Patient Stated Goal: to improve PT Goal Formulation: With patient Time For Goal Achievement: 08/22/20 Potential to Achieve Goals: Fair Progress towards PT goals: Progressing toward goals    Frequency    Min  2X/week      PT Plan Current plan remains appropriate    Co-evaluation              AM-PAC PT "6 Clicks" Mobility   Outcome Measure  Help needed turning from your back to your side while in a flat bed without using bedrails?: A Little Help needed moving from lying on your back to sitting on the side of a flat bed without using bedrails?: A Little Help needed moving to and from a bed to a chair (including a wheelchair)?: A Little Help needed standing up from a chair using your arms (e.g., wheelchair or bedside chair)?: A Little Help needed to walk in hospital room?: A Little Help needed climbing 3-5 steps with a railing? : A Lot 6 Click Score: 17    End of Session Equipment Utilized During Treatment: Oxygen;Gait belt Activity Tolerance: Patient limited by fatigue Patient left: in chair;with call bell/phone within reach;with chair alarm set Nurse Communication: Mobility status PT Visit Diagnosis: Other abnormalities of gait and mobility (R26.89);Difficulty in walking, not elsewhere classified (R26.2);Muscle weakness (generalized) (M62.81);Unsteadiness on feet (R26.81)     Time: 1540-0867 PT Time Calculation (min) (ACUTE ONLY): 42 min  Charges:  $Gait Training: 23-37 mins $Therapeutic Exercise: 8-22 mins                      [name], PT Pager [phone]    Rexanne Mano 08/16/2020, 5:09 PM

## 2020-08-17 DIAGNOSIS — J9602 Acute respiratory failure with hypercapnia: Secondary | ICD-10-CM | POA: Diagnosis not present

## 2020-08-17 LAB — CBC
HCT: 28 % — ABNORMAL LOW (ref 39.0–52.0)
Hemoglobin: 8.4 g/dL — ABNORMAL LOW (ref 13.0–17.0)
MCH: 29.1 pg (ref 26.0–34.0)
MCHC: 30 g/dL (ref 30.0–36.0)
MCV: 96.9 fL (ref 80.0–100.0)
Platelets: 205 10*3/uL (ref 150–400)
RBC: 2.89 MIL/uL — ABNORMAL LOW (ref 4.22–5.81)
RDW: 19.1 % — ABNORMAL HIGH (ref 11.5–15.5)
WBC: 5 10*3/uL (ref 4.0–10.5)
nRBC: 0 % (ref 0.0–0.2)

## 2020-08-17 LAB — APTT: aPTT: 92 seconds — ABNORMAL HIGH (ref 24–36)

## 2020-08-17 LAB — BASIC METABOLIC PANEL
Anion gap: 4 — ABNORMAL LOW (ref 5–15)
BUN: 17 mg/dL (ref 8–23)
CO2: 49 mmol/L — ABNORMAL HIGH (ref 22–32)
Calcium: 8.4 mg/dL — ABNORMAL LOW (ref 8.9–10.3)
Chloride: 91 mmol/L — ABNORMAL LOW (ref 98–111)
Creatinine, Ser: 1.15 mg/dL (ref 0.61–1.24)
GFR, Estimated: >60 mL/min (ref >60)
Glucose, Bld: 100 mg/dL — ABNORMAL HIGH (ref 70–99)
Potassium: 4.4 mmol/L (ref 3.5–5.1)
Sodium: 144 mmol/L (ref 135–145)

## 2020-08-17 LAB — HEPARIN LEVEL (UNFRACTIONATED)
Heparin Unfractionated: 0.62 IU/mL (ref 0.30–0.70)
Heparin Unfractionated: 0.47 IU/mL (ref 0.30–0.70)

## 2020-08-17 LAB — MAGNESIUM: Magnesium: 1.5 mg/dL — ABNORMAL LOW (ref 1.7–2.4)

## 2020-08-17 NOTE — Progress Notes (Signed)
ANTICOAGULATION CONSULT NOTE - Follow Up Consult   Pharmacy Consult:  IV Heparin Indication: atrial fibrillation   Patient Measurements: Height: 6' (182.9 cm) Weight: (!) 208.5 kg (459 lb 10.6 oz) IBW/kg (Calculated) : 77.6 Heparin Dosing Weight: 97 kg  Vital Signs: Temp: 98.2 F (36.8 C) (03/29 1547) Temp Source: Oral (03/29 1547) BP: 133/75 (03/29 1547) Pulse Rate: 103 (03/29 1547)  Labs: Recent Labs    08/15/20 0026 08/15/20 1135 08/16/20 0256 08/16/20 1926 08/17/20 0209 08/17/20 1712  HGB 6.8* 8.2* 8.0*  --  8.4*  --   HCT 22.8* 27.0* 26.6*  --  28.0*  --   PLT 180  --  185  --  205  --   APTT  --   --   --  83* 92*  --   HEPARINUNFRC  --   --   --  0.52 0.62 0.47  CREATININE 1.35*  --  1.25*  --  1.15  --     Estimated Creatinine Clearance: 94.2 mL/min (by C-G formula based on SCr of 1.15 mg/dL).   Medical History:  COPD, stage 1 lung cancer 2021, dementia, atrial fibrillation, GERD, leukemia   Assessment: 80 yr old man with hx of atrial fibrillation on apixaban PTA, presented after a fall.  Patient was transitioned to IV heparin and then apixaban was resumed. He developed GIB with hypotension and received KCentra on 08/06/20.  Pt is now stable; pharmacy was consulted to resume anticoagulation with IV heparin.  Heparin level this evening ~8 hrs after heparin infusion was decreased to 1300 units/hr  is 0.47 units/ml, which is within the goal range for this pt. H/H 8.4/28.0, plt 205. Per RN, no issues with IV or bleeding observed.  Goal of Therapy:  aPTT 66-85 sec Heparin level 0.3-0.5 units/ml Monitor platelets by anticoagulation protocol: Yes   Plan:  Continue heparin infusion at 1300 units/hr Check confirmatory heparin level in 8 hrs Stop heparin tomorrow AM at 0845 for GI procedure Monitor daily heparin level, CBC Monitor for bleeding  Gillermina Hu, PharmD, BCPS, [facility] Clinical Pharmacist 08/17/2020 7:20 PM

## 2020-08-17 NOTE — Progress Notes (Signed)
Pt stated he does not want to go on bipap for the night. RT made pt aware if he changed his mind to let us know.

## 2020-08-17 NOTE — Progress Notes (Signed)
ANTICOAGULATION CONSULT NOTE   Pharmacy Consult:  Heparin Indication: atrial fibrillation   Patient Measurements: Height: 6' (182.9 cm) Weight: (!) 208.5 kg (459 lb 10.6 oz) IBW/kg (Calculated) : 77.6 Heparin Dosing Weight: 97 kg  Vital Signs: Temp: 98.6 F (37 C) (03/29 0755) Temp Source: Oral (03/29 0755) BP: 127/72 (03/29 0755) Pulse Rate: 93 (03/29 0755)  Labs: Recent Labs    08/15/20 0026 08/15/20 1135 08/16/20 0256 08/16/20 1926 08/17/20 0209  HGB 6.8* 8.2* 8.0*  --  8.4*  HCT 22.8* 27.0* 26.6*  --  28.0*  PLT 180  --  185  --  205  APTT  --   --   --  83* 92*  HEPARINUNFRC  --   --   --  0.52 0.62  CREATININE 1.35*  --  1.25*  --  1.15    Estimated Creatinine Clearance: 94.2 mL/min (by C-G formula based on SCr of 1.15 mg/dL).   Medical History: COPD, stage 1 lung cancer 2021, dementia, A fib on Eliquis, GERD, leukemia   Assessment: 52 YOM with history of Afib on Eliquis PTA, presented s/p fall.  Patient was transitioned to IV heparin and then Eliquis was resumed.  He developed GIB with hypotension and received KCentra on 08/06/20.  Now stable and Pharmacy consulted to resume [facility] with IV heparin.    Last Eliquis dose was on 3/26 PM. Heparin level 0.52 - just slightly above goal of 0.3 to 0.5 (but may have been impacted by Eliquis doses on 3/26).  APTT 83 - at goal. Will continue current rate based on aPTT Hgb 8 - stable. Platelets stable.  No bleeding reported.   Heparin level came back at 0.62 and PTT 92. It seems to be correlating now. We will monitor with heparin level from now on. Will decrease rate slightly d/t modified lower goal.   Goal of Therapy:  APTT 66-85 Heparin level 0.3-0.5 units/ml Monitor platelets by anticoagulation protocol: Yes   Plan:  Decrease heparin 1300 units/hr  Check 8 hr HL Daily heparin level, CBC  Onnie Boer, PharmD, BCIDP, AAHIVP, CPP Infectious Disease Pharmacist 08/17/2020 8:25 AM

## 2020-08-17 NOTE — Progress Notes (Signed)
PROGRESS NOTE    Todd Lloyd  OZD:[id number] DOB: 05/05/1941 DOA: 07/26/2020 PCP: Haywood Pao, MD    Brief Narrative:  This 80 year old male with history of COPD, stage I non-small cell lung cancer s/p radiation in 04/2020, dementia, paroxysmal A. fib on Eliquis, GERD, leukemia s/p bone marrow transplantation in 1990 was admitted to ICU under PCCM service with altered mental status after a fall and diarrhea. He was found to be in shock / respiratory failure requiring BiPAP, acute kidney injury, rhabdomyolysis.  CT of the head and C-spine were negative for acute abnormalities. CT of the abdomen and pelvis showed focal colitis without evidence of perforation. Neurology and nephrology were consulted. He was treated with bicarb drip. EEG was negative for seizures. He was treated with broad-spectrum antibiotics initially; stool was positive for rotavirus and enteropathogenic E. Coli, received Zithromax. He was treated with intravenous fluids initially. Urine output has been minimal and he has been getting intermittent Lasix recently by nephrology. Mental status has improved; Neurology signed off. Patient was transferred to [facility] service on 07/31/2020. GI has been consulted for acute on chronic blood loss anemia.  Patient has had several bowel movement with thick dark blood clots.  Patient has received so far 4 units of PRBCs.  Hemoglobin today 7.5,  seems stable.  Monitor H&H.  May need intervention if He continues have bleeding.  Assessment & Plan:   Principal Problem:   Acute hypercapnic respiratory failure (HCC) Active Problems:   Chronic atrial fibrillation (HCC)   COPD (chronic obstructive pulmonary disease) (HCC)   Chronic anticoagulation   Solitary pulmonary nodule   SIRS (systemic inflammatory response syndrome) (HCC)   AKI (acute kidney injury) (HCC)   Elevated brain natriuretic peptide (BNP) level   Volume overload   Hypotension   Elevated CK   Abnormal CT scan, colon    Hematochezia   Intestinal infection due to enteropathogenic E. coli   Acute on chronic blood loss anemia, with ongoing GI bleed, hypotension : -Patient presented with clots with large BM, Eliquis was placed on hold -Hemoglobin down to 6.2 on 3/19, Has received 5 units PRBC so far. Hb remained stable - started PPI drip, consulted with Gastroenterology -Unable to get CTA abdomen due to renal dysfunction, creatinine 4.76.   -Currently not stable for tagged RBC scan with hypotension. - Kcentra was given for Eliquis reversal, last Eliquis dose  3/19 - CCM consulted for hemorrhagic shock, BP was in 80s. Now improved,. 3/21: Hemoglobin 7.5.  No further bleeding noted. GI recommended monitor H&H, continue PPI every 12h,  bleeding has stopped,  notify GI if has further bleeding. 3/22 : Repeat CT abdomen shows thickening in the descending colon. 3/23: Hemoglobin remained stable.  GI recommended resuming heparin without bolus on 3/ 25, transition to Eliquis on 3/26, if hemoglobin remains stable, consider discharge planning.  High risk colonoscopy if he bleeds. 3/25: Heparin resumed, hemoglobin remained stable 7.6. 3/26: Transitioned to Eliquis.  Hemoglobin dropped from 7.4->6.8.  Patient denies any blood in the stools. 3/27: GI reconsulted.  GI states colonoscopy would be high risk and not recommended.  Hold Eliquis for now,  rechallenge with heparin tomorrow or next day.  The risks and benefits of long-term anticoagulation need to be discussed.  Monitor H&H. 3/28: Heparin resumed without bolus. monitor of GI bleeding. 3/29: GI planning to proceed with colonoscopy under anesthesia.  Patient has to make final decision. Tentative colonoscopy 3/30.  Acute kidney injury: > Resolved -Likely secondary from ATN, septic  shock and rhabdomyolysis -Initially treated with IV fluids, subsequently with intermittent Lasix.  -Nephrology following, creatinine appears to be trending down -Renal ultrasound negative  for obstruction, continue to hold losartan, avoid nephrotoxins -Renal functions improved, AKI resolved.  Acute hypoxic and hypercarbic respiratory failure: sec. to acute COPD exacerbation - On 3/17, patient found to be in hypercarbic respiratory failure, lethargic not responding to verbal commands.   - ABG showed pH of 7.2, PCO2 70.1.  Overnight patient had refused BiPAP. - Placed on BiPAP, alert and oriented this morning - 2D echo showed EF of 55 to 60%.  - Continue BiPAP QHS and PRN.   Acute metabolic encephalopathy superimposed on dementia, myoclonic jerks :> Resolved. -Likely due to hypercarbic respiratory failure, now improved, alert and oriented now.  -Patient follows neurology outpatient, Dr. [name].  -CT head negative for acute intracranial pathology -Melatonin discontinued, avoid sedating meds, BiPAP nightly -Patient had EEG during admission, negative for seizures.  - MRI was initially deferred due to rapid improvement in his mental status. - Continue Namenda, Aricept, fall precautions  Septic shock, present on admission.  Rotavirus and enteropathogenic E. coli diarrhea > Resolved. -Initially treated with aggressive IV fluid hydration, broad-spectrum antibiotics. -Also received empiric antibiotics for meningitis due to altered mental status, DC'd on 07/27/2020. -Blood cultures, urine cultures negative, Covid/flu negative, C. difficile negative -GI PCR was positive for rotavirus/enteropathogenic E. coli, received a dose of Zithromax on 07/28/2020 -Antibiotics have been subsequently discontinued.    History of stage I right lung cancer status post XRT -Outpatient follow-up with oncology  Paroxysmal atrial fibrillation : -Rate controlled, digoxin on hold. -Continue to Hold Eliquis. -Heparin resumed on 3/25, transitioned to Eliquis on 3/26, Eliquis discontinued 3/27. - 3/28: Heparin resumed.  Leukocytosis -Resolved  Thrombocytopenia: Resolved. monitor  platelets  GERD -Continue PPI.  Hypomagnesemia: Replacement in progress. MagSO4 4 mg x 1 Recheck Labs.   Obesity Estimated body mass index is 29.74 kg/m as calculated from the following:   Height as of this encounter: 6' (1.829 m).   Weight as of this encounter: 99.5 kg.   DVT prophylaxis: SCDs Code Status: DNR Family Communication: No family at bed side, Disposition Plan:   Status is: Inpatient  Remains inpatient appropriate because:Inpatient level of care appropriate due to severity of illness   Dispo: The patient is from: Home              Anticipated d/c is to: SNF              Patient currently is not medically stable to d/c.   Difficult to place patient No   Consultants:   Gastroenterology  Procedures: 2D Echo  Antimicrobials:     Anti-infectives (From admission, onward)   Start     Dose/Rate Route Frequency Ordered Stop   07/28/20 0845  azithromycin (ZITHROMAX) tablet 1,000 mg        1,000 mg Oral  Once 07/28/20 0747 07/28/20 0827   07/28/20 0608  cefTRIAXone (ROCEPHIN) 2 g in sodium chloride 0.9 % 100 mL IVPB  Status:  Discontinued        2 g 200 mL/hr over 30 Minutes Intravenous Every 24 hours 07/27/20 1447 07/28/20 0747   07/27/20 1045  metroNIDAZOLE (FLAGYL) IVPB 500 mg        500 mg 100 mL/hr over 60 Minutes Intravenous  Once 07/27/20 0956 07/27/20 1113   07/26/20 1930  acyclovir (ZOVIRAX) 775 mg in dextrose 5 % 150 mL IVPB  Status:  Discontinued  10 mg/kg  77.6 kg (Ideal) 165.5 mL/hr over 60 Minutes Intravenous Every 24 hours 07/26/20 1848 07/27/20 1424   07/26/20 1915  ampicillin (OMNIPEN) 2 g in sodium chloride 0.9 % 100 mL IVPB  Status:  Discontinued        2 g 300 mL/hr over 20 Minutes Intravenous Every 8 hours 07/26/20 1848 07/27/20 1424   07/26/20 1900  cefTRIAXone (ROCEPHIN) 2 g in sodium chloride 0.9 % 100 mL IVPB  Status:  Discontinued        2 g 200 mL/hr over 30 Minutes Intravenous Every 12 hours 07/26/20 1846 07/27/20 1447    07/26/20 1851  vancomycin variable dose per unstable renal function (pharmacist dosing)  Status:  Discontinued         Does not apply See admin instructions 07/26/20 1853 07/27/20 1424   07/26/20 1815  ceFEPIme (MAXIPIME) 2 g in sodium chloride 0.9 % 100 mL IVPB  Status:  Discontinued        2 g 200 mL/hr over 30 Minutes Intravenous  Once 07/26/20 1802 07/26/20 1846   07/26/20 1815  metroNIDAZOLE (FLAGYL) IVPB 500 mg        500 mg 100 mL/hr over 60 Minutes Intravenous  Once 07/26/20 1802 07/26/20 2049   07/26/20 1815  vancomycin (VANCOREADY) IVPB 1750 mg/350 mL        1,750 mg 175 mL/hr over 120 Minutes Intravenous  Once 07/26/20 1802 07/26/20 2228     Subjective: Patient was seen and examined at bedside.  Overnight events noted. Patient reports having bowel movement, denies any blood in the stools. GI is planning to do high risk colonoscopy under anesthesia but patient has to make a final decision.  He states he wants to think about it.   Objective: Vitals:   08/16/20 2000 08/17/20 0606 08/17/20 0755 08/17/20 0936  BP:   127/72   Pulse:   93   Resp:   (!) 22   Temp:   98.6 F (37 C)   TempSrc:   Oral   SpO2: 98%  97% 96%  Weight:  (!) 208.5 kg    Height:        Intake/Output Summary (Last 24 hours) at 08/17/2020 1205 Last data filed at 08/17/2020 1112 Gross per 24 hour  Intake 127.69 ml  Output 4496 ml  Net -4368.31 ml   Filed Weights   08/15/20 0500 08/16/20 0601 08/17/20 0606  Weight: 95.5 kg 95.5 kg (!) 208.5 kg    Examination:  General exam: Appears calm and comfortable, not in any acute distress. Respiratory system: Clear to auscultation. Respiratory effort normal. Cardiovascular system: S1 & S2 heard, RRR. No JVD, murmurs, rubs, gallops or clicks. No pedal edema. Gastrointestinal system: Abdomen is nondistended, soft and nontender. No organomegaly or masses felt.  Normal bowel sounds heard. Central nervous system: Alert and oriented. No focal neurological  deficits. Extremities: Symmetric 5 x 5 power. No edema, no cyanosis, no clubbing. Skin: No rashes, lesions or ulcers Psychiatry: Judgement and insight appear normal. Mood & affect appropriate.     Data Reviewed: I have personally reviewed following labs and imaging studies  CBC: Recent Labs  Lab 08/14/20 0510 08/15/20 0026 08/15/20 1135 08/16/20 0256 08/17/20 0209  WBC 6.4 4.0  --  5.0 5.0  HGB 7.4* 6.8* 8.2* 8.0* 8.4*  HCT 23.7* 22.8* 27.0* 26.6* 28.0*  MCV 91.9 95.8  --  95.3 96.9  PLT 199 180  --  185 628   Basic Metabolic Panel: Recent Labs  Lab 08/11/20 0306 08/12/20 0347 08/13/20 0235 08/14/20 0510 08/15/20 0026 08/16/20 0256 08/17/20 0209  NA 141 143 142 143 142 141 144  K 3.3* 3.9 4.2 3.6 3.6 3.7 4.4  CL 101 104 101 97* 96* 94* 91*  CO2 34* 35* 35* 38* 41* 43* 49*  GLUCOSE 100* 96 97 122* 98 89 100*  BUN 41* 32* 25* 23 20 20 17   CREATININE 1.77* 1.56* 1.44* 1.29* 1.35* 1.25* 1.15  CALCIUM 7.7* 8.1* 8.3* 8.5* 8.0* 8.2* 8.4*  MG 1.2* 1.3* 1.3*  --  1.3* 1.3*  --   PHOS 4.6 4.0 3.0  --  3.7 2.8  --    GFR: Estimated Creatinine Clearance: 94.2 mL/min (by C-G formula based on SCr of 1.15 mg/dL). Liver Function Tests: No results for input(s): AST, ALT, ALKPHOS, BILITOT, PROT, ALBUMIN in the last 168 hours. No results for input(s): LIPASE, AMYLASE in the last 168 hours. No results for input(s): AMMONIA in the last 168 hours. Coagulation Profile: No results for input(s): INR, PROTIME in the last 168 hours. Cardiac Enzymes: No results for input(s): CKTOTAL, CKMB, CKMBINDEX, TROPONINI in the last 168 hours. BNP (last 3 results) No results for input(s): PROBNP in the last 8760 hours. HbA1C: No results for input(s): HGBA1C in the last 72 hours. CBG: Recent Labs  Lab 08/12/20 2314 08/13/20 0314 08/13/20 0741 08/13/20 1224 08/13/20 1605  GLUCAP 95 102* 88 137* 103*   Lipid Profile: No results for input(s): CHOL, HDL, LDLCALC, TRIG, CHOLHDL, LDLDIRECT in  the last 72 hours. Thyroid Function Tests: No results for input(s): TSH, T4TOTAL, FREET4, T3FREE, THYROIDAB in the last 72 hours. Anemia Panel: No results for input(s): VITAMINB12, FOLATE, FERRITIN, TIBC, IRON, RETICCTPCT in the last 72 hours. Sepsis Labs: No results for input(s): PROCALCITON, LATICACIDVEN in the last 168 hours.  No results found for this or any previous visit (from the past 240 hour(s)).    Radiology Studies: No results found. Scheduled Meds: . sodium chloride   Intravenous Once  . acetaminophen  650 mg Oral TID  . budesonide (PULMICORT) nebulizer solution  0.5 mg Nebulization BID  . calcium carbonate  1 tablet Oral Q breakfast  . chlorhexidine  15 mL Mouth Rinse BID  . Chlorhexidine Gluconate Cloth  6 each Topical Q0600  . dextromethorphan-guaiFENesin  1 tablet Oral BID  . donepezil  5 mg Oral QHS  . feeding supplement  1 Container Oral TID WC  . furosemide  40 mg Intravenous Daily  . mouth rinse  15 mL Mouth Rinse q12n4p  . memantine  5 mg Oral BID  . metoCLOPramide (REGLAN) injection  10 mg Intravenous Once   Followed by  . metoCLOPramide (REGLAN) injection  10 mg Intravenous Once  . pantoprazole  40 mg Oral Daily  . peg 3350 powder  0.5 kit Oral Once   And  . peg 3350 powder  0.5 kit Oral Once   Continuous Infusions: . sodium chloride Stopped (08/15/20 0943)  . heparin 1,300 Units/hr (08/17/20 0926)  . lactated ringers 10 mL/hr at 07/30/20 1800     LOS: 22 days    Time spent: 25 mins    Rashauna Tep, MD [facility]   If 7PM-7AM, please contact night-coverage

## 2020-08-17 NOTE — Progress Notes (Signed)
Spoke with pts daughter this morning and she would like to be updated on events taking place such as the colonoscopy. She would like an update from the MD on the matter and doesn't won't any prep started until her father, the pt, decides to go through with it, if he chooses.

## 2020-08-17 NOTE — Progress Notes (Addendum)
          Daily Rounding Note  08/17/2020, 12:20 PM  LOS: 22 days   SUBJECTIVE:   Chief complaint:  Colitis, hematochezia   Feeling ok.  Stools not bloody.  No abd pain  OBJECTIVE:         Vital signs in last 24 hours:    Temp:  [98.3 F (36.8 C)-98.7 F (37.1 C)] 98.3 F (36.8 C) (03/29 1216) Pulse Rate:  [89-105] 96 (03/29 1216) Resp:  [20-24] 20 (03/29 1216) BP: (122-149)/(56-84) 122/56 (03/29 1216) SpO2:  [86 %-100 %] 100 % (03/29 1216) Weight:  [208.5 kg] 208.5 kg (03/29 0606) Last BM Date: 08/16/20 Filed Weights   08/15/20 0500 08/16/20 0601 08/17/20 0606  Weight: 95.5 kg 95.5 kg (!) 208.5 kg   General: looks better but still looks frail   Heart:irreg Chest: diminshed but clear Abdomen: NT, active BS, ND  Extremities: + LE edema Neuro/Psych:  Oriented x 3.  HOH.  No gross tremors or deficits.    Intake/Output from previous day: 03/28 0701 - 03/29 0700 In: 127.7 [I.V.:29.9; IV Piggyback:97.8] Out: 3009 [QZRAQ:7622; Stool:2]  Intake/Output this shift: Total I/O In: -  Out: 2000 [Urine:2000]  Lab Results: Recent Labs    08/15/20 0026 08/15/20 1135 08/16/20 0256 08/17/20 0209  WBC 4.0  --  5.0 5.0  HGB 6.8* 8.2* 8.0* 8.4*  HCT 22.8* 27.0* 26.6* 28.0*  PLT 180  --  185 205   BMET Recent Labs    08/15/20 0026 08/16/20 0256 08/17/20 0209  NA 142 141 144  K 3.6 3.7 4.4  CL 96* 94* 91*  CO2 41* 43* 49*  GLUCOSE 98 89 100*  BUN 20 20 17   CREATININE 1.35* 1.25* 1.15  CALCIUM 8.0* 8.2* 8.4*    ASSESMENT:   *   Painless hematochezia 3/18 in the setting of blood thinners. Right sided colitis on CT.  Stool tested positive for enteropathogenic E. coli and rotavirus early on. Hematochezia resolved within 2 days and did not recur.    *   Anemia.  Hgb 6.8 >> 8 >> 8.4. Received 5 PRBCs thus far.  The last, 5th unit, was given on 3/27.  *   Chronic Eliquis for A. fib.  Was held in the setting of  hematochezia. Heparin restarted 3/25 -3/26, then discontinued due to Hgb decline without GI bleeding.  Restarted 3/28   PLAN   *  Pt has weighed risks/benefits of endoscopic procedures and wants to move forward EGD and colonoscopy set for tmrw 3/30 at 12:45 PM Stop Heparin at 0845 AM tmrw.   See diet/prep orders.      Azucena Freed  08/17/2020, 12:20 PM Phone [phone]    Attending physician's note   I have taken an interval history, reviewed the chart and examined the patient. I agree with the Advanced Practitioner's note, impression and recommendations.   OK by anesthesia to proceed with endoscopic procedures.  Pt understands risks and benefits Would proceed with EGD and colon since he will be off AC Hold heparin accordingly 4 hrs before  Carmell Austria, MD [facility] [phone]

## 2020-08-17 NOTE — H&P (View-Only) (Signed)
          Daily Rounding Note  08/17/2020, 12:20 PM  LOS: 22 days   SUBJECTIVE:   Chief complaint:  Colitis, hematochezia   Feeling ok.  Stools not bloody.  No abd pain  OBJECTIVE:         Vital signs in last 24 hours:    Temp:  [98.3 F (36.8 C)-98.7 F (37.1 C)] 98.3 F (36.8 C) (03/29 1216) Pulse Rate:  [89-105] 96 (03/29 1216) Resp:  [20-24] 20 (03/29 1216) BP: (122-149)/(56-84) 122/56 (03/29 1216) SpO2:  [86 %-100 %] 100 % (03/29 1216) Weight:  [208.5 kg] 208.5 kg (03/29 0606) Last BM Date: 08/16/20 Filed Weights   08/15/20 0500 08/16/20 0601 08/17/20 0606  Weight: 95.5 kg 95.5 kg (!) 208.5 kg   General: looks better but still looks frail   Heart:irreg Chest: diminshed but clear Abdomen: NT, active BS, ND  Extremities: + LE edema Neuro/Psych:  Oriented x 3.  HOH.  No gross tremors or deficits.    Intake/Output from previous day: 03/28 0701 - 03/29 0700 In: 127.7 [I.V.:29.9; IV Piggyback:97.8] Out: 7035 [KKXFG:1829; Stool:2]  Intake/Output this shift: Total I/O In: -  Out: 2000 [Urine:2000]  Lab Results: Recent Labs    08/15/20 0026 08/15/20 1135 08/16/20 0256 08/17/20 0209  WBC 4.0  --  5.0 5.0  HGB 6.8* 8.2* 8.0* 8.4*  HCT 22.8* 27.0* 26.6* 28.0*  PLT 180  --  185 205   BMET Recent Labs    08/15/20 0026 08/16/20 0256 08/17/20 0209  NA 142 141 144  K 3.6 3.7 4.4  CL 96* 94* 91*  CO2 41* 43* 49*  GLUCOSE 98 89 100*  BUN 20 20 17   CREATININE 1.35* 1.25* 1.15  CALCIUM 8.0* 8.2* 8.4*    ASSESMENT:   *   Painless hematochezia 3/18 in the setting of blood thinners. Right sided colitis on CT.  Stool tested positive for enteropathogenic E. coli and rotavirus early on. Hematochezia resolved within 2 days and did not recur.    *   Anemia.  Hgb 6.8 >> 8 >> 8.4. Received 5 PRBCs thus far.  The last, 5th unit, was given on 3/27.  *   Chronic Eliquis for A. fib.  Was held in the setting of  hematochezia. Heparin restarted 3/25 -3/26, then discontinued due to Hgb decline without GI bleeding.  Restarted 3/28   PLAN   *  Pt has weighed risks/benefits of endoscopic procedures and wants to move forward EGD and colonoscopy set for tmrw 3/30 at 12:45 PM Stop Heparin at 0845 AM tmrw.   See diet/prep orders.      Azucena Freed  08/17/2020, 12:20 PM Phone [phone]    Attending physician's note   I have taken an interval history, reviewed the chart and examined the patient. I agree with the Advanced Practitioner's note, impression and recommendations.   OK by anesthesia to proceed with endoscopic procedures.  Pt understands risks and benefits Would proceed with EGD and colon since he will be off AC Hold heparin accordingly 4 hrs before  Carmell Austria, MD [facility] [phone]

## 2020-08-17 NOTE — Progress Notes (Signed)
Pt states that he would really like to talk to the GI MD as well as anesthesiologist about his concerns of the colonoscopy since he knows that it's a high risk procedure for him. Therefore, he stated he just wants more information so he can make the right decision for himself.

## 2020-08-18 ENCOUNTER — Encounter (HOSPITAL_COMMUNITY)
Admission: EM | Disposition: A | Discharge status: Skilled Nursing Facility | Payer: Self-pay | Source: Home / Self Care | Attending: Family Medicine

## 2020-08-18 ENCOUNTER — Inpatient Hospital Stay (HOSPITAL_COMMUNITY): Payer: Medicare Other | Admitting: Anesthesiology

## 2020-08-18 ENCOUNTER — Encounter (HOSPITAL_COMMUNITY): Payer: Self-pay | Admitting: Pulmonary Disease

## 2020-08-18 DIAGNOSIS — K635 Polyp of colon: Secondary | ICD-10-CM

## 2020-08-18 DIAGNOSIS — K633 Ulcer of intestine: Secondary | ICD-10-CM

## 2020-08-18 DIAGNOSIS — J9602 Acute respiratory failure with hypercapnia: Secondary | ICD-10-CM | POA: Diagnosis not present

## 2020-08-18 HISTORY — PX: ESOPHAGOGASTRODUODENOSCOPY (EGD) WITH PROPOFOL: SHX5813

## 2020-08-18 HISTORY — PX: COLONOSCOPY WITH PROPOFOL: SHX5780

## 2020-08-18 HISTORY — PX: BIOPSY: SHX5522

## 2020-08-18 HISTORY — PX: POLYPECTOMY: SHX5525

## 2020-08-18 LAB — BASIC METABOLIC PANEL
Anion gap: 5 (ref 5–15)
BUN: 11 mg/dL (ref 8–23)
CO2: 47 mmol/L — ABNORMAL HIGH (ref 22–32)
Calcium: 8.6 mg/dL — ABNORMAL LOW (ref 8.9–10.3)
Chloride: 90 mmol/L — ABNORMAL LOW (ref 98–111)
Creatinine, Ser: 0.94 mg/dL (ref 0.61–1.24)
GFR, Estimated: >60 mL/min (ref >60)
Glucose, Bld: 78 mg/dL (ref 70–99)
Potassium: 3.4 mmol/L — ABNORMAL LOW (ref 3.5–5.1)
Sodium: 142 mmol/L (ref 135–145)

## 2020-08-18 LAB — CBC
HCT: 28.3 % — ABNORMAL LOW (ref 39.0–52.0)
Hemoglobin: 8.7 g/dL — ABNORMAL LOW (ref 13.0–17.0)
MCH: 29.5 pg (ref 26.0–34.0)
MCHC: 30.7 g/dL (ref 30.0–36.0)
MCV: 95.9 fL (ref 80.0–100.0)
Platelets: 197 10*3/uL (ref 150–400)
RBC: 2.95 MIL/uL — ABNORMAL LOW (ref 4.22–5.81)
RDW: 19 % — ABNORMAL HIGH (ref 11.5–15.5)
WBC: 4.1 10*3/uL (ref 4.0–10.5)
nRBC: 0 % (ref 0.0–0.2)

## 2020-08-18 LAB — MAGNESIUM: Magnesium: 1.4 mg/dL — ABNORMAL LOW (ref 1.7–2.4)

## 2020-08-18 LAB — PHOSPHORUS: Phosphorus: 3.3 mg/dL (ref 2.5–4.6)

## 2020-08-18 LAB — GLUCOSE, CAPILLARY: Glucose-Capillary: 65 mg/dL — ABNORMAL LOW (ref 70–99)

## 2020-08-18 LAB — HEPARIN LEVEL (UNFRACTIONATED): Heparin Unfractionated: 0.45 IU/mL (ref 0.30–0.70)

## 2020-08-18 SURGERY — ESOPHAGOGASTRODUODENOSCOPY (EGD) WITH PROPOFOL
Anesthesia: Monitor Anesthesia Care

## 2020-08-18 MED ORDER — LACTATED RINGERS IV SOLN: INTRAVENOUS | Status: DC | PRN

## 2020-08-18 MED ORDER — POTASSIUM CHLORIDE 10 MEQ/100ML IV SOLN
10.0000 meq | INTRAVENOUS | Status: AC
  Administered 2020-08-18 (×2): 10 meq via INTRAVENOUS
  Filled 2020-08-18 (×2): qty 100

## 2020-08-18 MED ORDER — LIDOCAINE 2% (20 MG/ML) 5 ML SYRINGE
INTRAMUSCULAR | Status: DC | PRN
  Administered 2020-08-18: 60 mg via INTRAVENOUS

## 2020-08-18 MED ORDER — PHENYLEPHRINE 40 MCG/ML (10ML) SYRINGE FOR IV PUSH (FOR BLOOD PRESSURE SUPPORT)
PREFILLED_SYRINGE | INTRAVENOUS | Status: DC | PRN
  Administered 2020-08-18: 40 ug via INTRAVENOUS
  Administered 2020-08-18: 80 ug via INTRAVENOUS

## 2020-08-18 MED ORDER — PROPOFOL 500 MG/50ML IV EMUL
INTRAVENOUS | Status: DC | PRN
  Administered 2020-08-18: 75 ug/kg/min via INTRAVENOUS

## 2020-08-18 MED ORDER — MAGNESIUM SULFATE 2 GM/50ML IV SOLN
2.0000 g | Freq: Once | INTRAVENOUS | Status: AC
  Administered 2020-08-18: 2 g via INTRAVENOUS
  Filled 2020-08-18: qty 50

## 2020-08-18 MED ORDER — HEPARIN (PORCINE) 25000 UT/250ML-% IV SOLN
1300.0000 [IU]/h | INTRAVENOUS | Status: DC
  Administered 2020-08-18: 1300 [IU]/h via INTRAVENOUS
  Filled 2020-08-18 (×2): qty 250

## 2020-08-18 SURGICAL SUPPLY — 25 items

## 2020-08-18 NOTE — Progress Notes (Signed)
PT Cancellation Note  Patient Details Name: Todd Lloyd MRN: [id number] DOB: 11-20-1940   Cancelled Treatment:    Reason Eval/Treat Not Completed: Patient at procedure or test/unavailable - at colonoscopy and EGD under general when PT checked on him this am, PT to check back as schedule allows.  Stacie Glaze, PT Acute Rehabilitation Services Pager [phone]  Office [phone]    Louis Matte 08/18/2020, 1:39 PM

## 2020-08-18 NOTE — Progress Notes (Signed)
ANTICOAGULATION CONSULT NOTE - Follow Up Consult   Pharmacy Consult:  IV Heparin Indication: atrial fibrillation   Patient Measurements: Height: 6' (182.9 cm) Weight: (!) 208.5 kg (459 lb 10.6 oz) IBW/kg (Calculated) : 77.6 Heparin Dosing Weight: 97 kg  Vital Signs: Temp: 98.2 F (36.8 C) (03/30 1347) Temp Source: Oral (03/30 1347) BP: 110/91 (03/30 1347) Pulse Rate: 83 (03/30 1347)  Labs: Recent Labs    08/16/20 0256 08/16/20 1926 08/16/20 1926 08/17/20 0209 08/17/20 1712 08/18/20 0321  HGB 8.0*  --   --  8.4*  --  8.7*  HCT 26.6*  --   --  28.0*  --  28.3*  PLT 185  --   --  205  --  197  APTT  --  83*  --  92*  --   --   HEPARINUNFRC  --  0.52   < > 0.62 0.47 0.45  CREATININE 1.25*  --   --  1.15  --  0.94   < > = values in this interval not displayed.    Estimated Creatinine Clearance: 115.2 mL/min (by C-G formula based on SCr of 0.94 mg/dL).   Medical History:  COPD, stage 1 lung cancer 2021, dementia, atrial fibrillation, GERD, leukemia   Assessment: 80 yr old man with hx of atrial fibrillation on apixaban PTA, presented after a fall.  Patient was transitioned to IV heparin and then apixaban was resumed. He developed GIB with hypotension and received KCentra on 08/06/20.  Pt is now stable; pharmacy was consulted to resume anticoagulation with IV heparin.  Pt is s/p EGD/colonoscopy. Ok to resume heparin at 1800 this PM.   Goal of Therapy:  aPTT 66-85 sec Heparin level 0.3-0.5 units/ml Monitor platelets by anticoagulation protocol: Yes   Plan:  Restart heparin infusion at 1300 units/hr at 1800 Heparin level in AM Monitor daily heparin level, CBC  Onnie Boer, PharmD, BCIDP, AAHIVP, CPP Infectious Disease Pharmacist 08/18/2020 2:31 PM

## 2020-08-18 NOTE — Anesthesia Preprocedure Evaluation (Addendum)
Anesthesia Evaluation  Patient identified by MRN, date of birth, ID band Patient awake    Reviewed: Allergy & Precautions, NPO status , Patient's Chart, lab work & pertinent test results, reviewed documented beta blocker date and time   Airway Mallampati: III  TM Distance: >3 FB Neck ROM: Full    Dental  (+) Edentulous Lower, Edentulous Upper   Pulmonary shortness of breath, with exertion, at rest and lying, COPD,  COPD inhaler and oxygen dependent, former smoker,     + decreased breath sounds      Cardiovascular hypertension, Pt. on medications + dysrhythmias Atrial Fibrillation  Rhythm:Irregular Rate:Normal     Neuro/Psych Restless legs syndrome Peripheral neuropathy Memory difficulty Hx/o ataxia  Neuromuscular disease negative psych ROS   GI/Hepatic Neg liver ROS, GERD  Medicated and Controlled,Hematochezia Colitis   Endo/Other  Morbid obesity  Renal/GU ARFRenal disease  negative genitourinary   Musculoskeletal  (+) Arthritis , Osteoarthritis,  Chronic low back pain   Abdominal (+) + obese,   Peds  Hematology  (+) anemia , Eliquis therapy- last dose 07/26/20   Anesthesia Other Findings   Reproductive/Obstetrics                            Anesthesia Physical Anesthesia Plan  ASA: IV  Anesthesia Plan: MAC   Post-op Pain Management:    Induction: Intravenous  PONV Risk Score and Plan: 0 and Treatment may vary due to age or medical condition and Propofol infusion  Airway Management Planned: Natural Airway, Nasal Cannula and Simple Face Mask  Additional Equipment:   Intra-op Plan:   Post-operative Plan: Extubation in OR  Informed Consent: I have reviewed the patients History and Physical, chart, labs and discussed the procedure including the risks, benefits and alternatives for the proposed anesthesia with the patient or authorized representative who has indicated his/her  understanding and acceptance.   Patient has DNR.  Suspend DNR and Discussed DNR with patient.     Plan Discussed with: CRNA and Anesthesiologist  Anesthesia Plan Comments:         Anesthesia Quick Evaluation

## 2020-08-18 NOTE — Progress Notes (Signed)
PROGRESS NOTE    Todd Lloyd  UVO:[id number] DOB: 1940/10/07 DOA: 07/26/2020 PCP: Haywood Pao, MD    Brief Narrative:  This 80 year old male with history of COPD, stage I non-small cell lung cancer s/p radiation in 04/2020, dementia, paroxysmal A. fib on Eliquis, GERD, leukemia s/p bone marrow transplantation in 1990 was admitted to ICU under PCCM service with altered mental status after a fall and diarrhea. He was found to be in shock / respiratory failure requiring BiPAP, acute kidney injury, rhabdomyolysis.  CT of the head and C-spine were negative for acute abnormalities. CT of the abdomen and pelvis showed focal colitis without evidence of perforation. Neurology and nephrology were consulted. He was treated with bicarb drip. EEG was negative for seizures. He was treated with broad-spectrum antibiotics initially; stool was positive for rotavirus and enteropathogenic E. Coli, received Zithromax. He was treated with intravenous fluids initially. Urine output has been minimal and he has been getting intermittent Lasix recently by nephrology. Mental status has improved; Neurology signed off. Patient was transferred to [facility] service on 07/31/2020. GI has been consulted for acute on chronic blood loss anemia.  Patient has had several bowel movement with thick dark blood clots.  Patient has received so far 5 units of PRBCs.  Hemoglobin today 7.5,  seems stable.  Monitor H&H.  Eventually undergoing high risk colonoscopy and EGD under general anesthesia.  Assessment & Plan:   Principal Problem:   Acute hypercapnic respiratory failure (HCC) Active Problems:   Chronic atrial fibrillation (HCC)   COPD (chronic obstructive pulmonary disease) (HCC)   Chronic anticoagulation   Solitary pulmonary nodule   SIRS (systemic inflammatory response syndrome) (HCC)   AKI (acute kidney injury) (HCC)   Elevated brain natriuretic peptide (BNP) level   Volume overload   Hypotension   Elevated CK    Abnormal CT scan, colon   Hematochezia   Intestinal infection due to enteropathogenic E. coli   Acute on chronic blood loss anemia, with ongoing GI bleed, hypotension : -Patient presented with clots with large BM, Eliquis was placed on hold -Hemoglobin down to 6.2 on 3/19, Has received 5 units PRBC so far. Hb remained stable - started PPI drip, consulted with Gastroenterology -Unable to get CTA abdomen due to renal dysfunction, creatinine 4.76.   -Currently not stable for tagged RBC scan with hypotension. - Kcentra was given for Eliquis reversal, last Eliquis dose  3/19 - CCM consulted for hemorrhagic shock, BP was in 80s. Now improved,. 3/21: Hemoglobin 7.5.  No further bleeding noted. GI recommended monitor H&H, continue PPI every 12h,  bleeding has stopped,  notify GI if has further bleeding. 3/22 : Repeat CT abdomen shows thickening in the descending colon. 3/23: Hemoglobin remained stable.  GI recommended resuming heparin without bolus on 3/ 25, transition to Eliquis on 3/26, if hemoglobin remains stable, consider discharge planning.  High risk colonoscopy if he bleeds. 3/25: Heparin resumed, hemoglobin remained stable 7.6. 3/26: Transitioned to Eliquis.  Hemoglobin dropped from 7.4->6.8.  Patient denies any blood in the stools. 3/27: GI reconsulted.  GI states colonoscopy would be high risk and not recommended.  Hold Eliquis for now,  rechallenge with heparin tomorrow or next day.  The risks and benefits of long-term anticoagulation need to be discussed.  Monitor H&H. 3/28: Heparin resumed without bolus. monitor of GI bleeding. 3/29: GI planning to proceed with colonoscopy under anesthesia.  Patient has to make final decision. 3/30: Patient is undergoing high risk colonoscopy and EGD under general anesthesia  today.  Acute kidney injury: > Resolved -Likely secondary from ATN, septic shock and rhabdomyolysis -Initially treated with IV fluids, subsequently with intermittent Lasix.   -Nephrology following, creatinine appears to be trending down -Renal ultrasound negative for obstruction, continue to hold losartan, avoid nephrotoxins -Renal functions improved, AKI resolved.  Acute hypoxic and hypercarbic respiratory failure: sec. to acute COPD exacerbation - On 3/17, patient found to be in hypercarbic respiratory failure, lethargic not responding to verbal commands.   - ABG showed pH of 7.2, PCO2 70.1.  Overnight patient had refused BiPAP. - Placed on BiPAP, alert and oriented this morning - 2D echo showed EF of 55 to 60%.  - Continue BiPAP QHS and PRN.   Acute metabolic encephalopathy superimposed on dementia, myoclonic jerks :> Resolved. -Likely due to hypercarbic respiratory failure, now improved, alert and oriented now.  -Patient follows neurology outpatient, Dr. [name].  -CT head negative for acute intracranial pathology -Melatonin discontinued, avoid sedating meds, BiPAP nightly -Patient had EEG during admission, negative for seizures.  - MRI was initially deferred due to rapid improvement in his mental status. - Continue Namenda, Aricept, fall precautions  Septic shock, present on admission.  Rotavirus and enteropathogenic E. coli diarrhea > Resolved. -Initially treated with aggressive IV fluid hydration, broad-spectrum antibiotics. -Also received empiric antibiotics for meningitis due to altered mental status, DC'd on 07/27/2020. -Blood cultures, urine cultures negative, Covid/flu negative, C. difficile negative -GI PCR was positive for rotavirus/enteropathogenic E. coli, received a dose of Zithromax on 07/28/2020 -Antibiotics have been subsequently discontinued.    History of stage I right lung cancer status post XRT -Outpatient follow-up with oncology  Paroxysmal atrial fibrillation : -Rate controlled, digoxin on hold. -Continue to Hold Eliquis. -Heparin resumed on 3/25, transitioned to Eliquis on 3/26, Eliquis discontinued 3/27. - 3/28: Heparin  resumed. -Heparin is on hold  4 hours prior to the procedure,  will be resumed 6 hours after procedure  Leukocytosis -Resolved  Thrombocytopenia: Resolved. monitor platelets  GERD -Continue PPI.  Hypomagnesemia: Replacement in progress. MagSO4 4 mg x 1 Recheck Labs.   Obesity Estimated body mass index is 29.74 kg/m as calculated from the following:   Height as of this encounter: 6' (1.829 m).   Weight as of this encounter: 99.5 kg.   DVT prophylaxis: SCDs Code Status: DNR Family Communication: No family at bed side, Disposition Plan:   Status is: Inpatient  Remains inpatient appropriate because:Inpatient level of care appropriate due to severity of illness   Dispo: The patient is from: Home              Anticipated d/c is to: SNF              Patient currently is not medically stable to d/c.   Difficult to place patient No   Consultants:   Gastroenterology  Procedures: 2D Echo  Antimicrobials:     Anti-infectives (From admission, onward)   Start     Dose/Rate Route Frequency Ordered Stop   07/28/20 0845  azithromycin (ZITHROMAX) tablet 1,000 mg        1,000 mg Oral  Once 07/28/20 0747 07/28/20 0827   07/28/20 0608  cefTRIAXone (ROCEPHIN) 2 g in sodium chloride 0.9 % 100 mL IVPB  Status:  Discontinued        2 g 200 mL/hr over 30 Minutes Intravenous Every 24 hours 07/27/20 1447 07/28/20 0747   07/27/20 1045  metroNIDAZOLE (FLAGYL) IVPB 500 mg        500 mg 100 mL/hr over  60 Minutes Intravenous  Once 07/27/20 0956 07/27/20 1113   07/26/20 1930  acyclovir (ZOVIRAX) 775 mg in dextrose 5 % 150 mL IVPB  Status:  Discontinued        10 mg/kg  77.6 kg (Ideal) 165.5 mL/hr over 60 Minutes Intravenous Every 24 hours 07/26/20 1848 07/27/20 1424   07/26/20 1915  ampicillin (OMNIPEN) 2 g in sodium chloride 0.9 % 100 mL IVPB  Status:  Discontinued        2 g 300 mL/hr over 20 Minutes Intravenous Every 8 hours 07/26/20 1848 07/27/20 1424   07/26/20 1900   cefTRIAXone (ROCEPHIN) 2 g in sodium chloride 0.9 % 100 mL IVPB  Status:  Discontinued        2 g 200 mL/hr over 30 Minutes Intravenous Every 12 hours 07/26/20 1846 07/27/20 1447   07/26/20 1851  vancomycin variable dose per unstable renal function (pharmacist dosing)  Status:  Discontinued         Does not apply See admin instructions 07/26/20 1853 07/27/20 1424   07/26/20 1815  ceFEPIme (MAXIPIME) 2 g in sodium chloride 0.9 % 100 mL IVPB  Status:  Discontinued        2 g 200 mL/hr over 30 Minutes Intravenous  Once 07/26/20 1802 07/26/20 1846   07/26/20 1815  metroNIDAZOLE (FLAGYL) IVPB 500 mg        500 mg 100 mL/hr over 60 Minutes Intravenous  Once 07/26/20 1802 07/26/20 2049   07/26/20 1815  vancomycin (VANCOREADY) IVPB 1750 mg/350 mL        1,750 mg 175 mL/hr over 120 Minutes Intravenous  Once 07/26/20 1802 07/26/20 2228     Subjective: Patient was seen and examined at bedside.  Overnight events noted. Patient reports having bowel movement, denies any blood in the stools. Patient has agreed to proceed with high risk colonoscopy and EGD under general anesthesia today. He denies any chest pain or shortness of breath.   Objective: Vitals:   08/18/20 0338 08/18/20 0811 08/18/20 0819 08/18/20 1108  BP: 139/78 140/65  139/74  Pulse: 98 98    Resp: 19 18  19   Temp: 97.8 F (36.6 C) 98.3 F (36.8 C)    TempSrc: Axillary Oral    SpO2: (!) 83% 90% 98% 94%  Weight:      Height:        Intake/Output Summary (Last 24 hours) at 08/18/2020 1227 Last data filed at 08/18/2020 1210 Gross per 24 hour  Intake 459.08 ml  Output 800 ml  Net -340.92 ml   Filed Weights   08/15/20 0500 08/16/20 0601 08/17/20 0606  Weight: 95.5 kg 95.5 kg (!) 208.5 kg    Examination:  General exam: Appears calm and comfortable, not in any acute distress. Respiratory system: Clear to auscultation. Respiratory effort normal. Cardiovascular system: S1 & S2 heard, RRR. No JVD, murmurs, rubs, gallops or  clicks. No pedal edema. Gastrointestinal system: Abdomen is nondistended, soft and nontender. No organomegaly or masses felt.  Normal bowel sounds heard. Central nervous system: Alert and oriented. No focal neurological deficits. Extremities: Symmetric 5 x 5 power. No edema, no cyanosis, no clubbing. Skin: No rashes, lesions or ulcers Psychiatry: Judgement and insight appear normal. Mood & affect appropriate.     Data Reviewed: I have personally reviewed following labs and imaging studies  CBC: Recent Labs  Lab 08/14/20 0510 08/15/20 0026 08/15/20 1135 08/16/20 0256 08/17/20 0209 08/18/20 0321  WBC 6.4 4.0  --  5.0 5.0 4.1  HGB  7.4* 6.8* 8.2* 8.0* 8.4* 8.7*  HCT 23.7* 22.8* 27.0* 26.6* 28.0* 28.3*  MCV 91.9 95.8  --  95.3 96.9 95.9  PLT 199 180  --  185 205 466   Basic Metabolic Panel: Recent Labs  Lab 08/12/20 0347 08/13/20 0235 08/14/20 0510 08/15/20 0026 08/16/20 0256 08/17/20 0209 08/17/20 1250 08/18/20 0321  NA 143 142 143 142 141 144  --  142  K 3.9 4.2 3.6 3.6 3.7 4.4  --  3.4*  CL 104 101 97* 96* 94* 91*  --  90*  CO2 35* 35* 38* 41* 43* 49*  --  47*  GLUCOSE 96 97 122* 98 89 100*  --  78  BUN 32* 25* 23 20 20 17   --  11  CREATININE 1.56* 1.44* 1.29* 1.35* 1.25* 1.15  --  0.94  CALCIUM 8.1* 8.3* 8.5* 8.0* 8.2* 8.4*  --  8.6*  MG 1.3* 1.3*  --  1.3* 1.3*  --  1.5* 1.4*  PHOS 4.0 3.0  --  3.7 2.8  --   --  3.3   GFR: Estimated Creatinine Clearance: 115.2 mL/min (by C-G formula based on SCr of 0.94 mg/dL). Liver Function Tests: No results for input(s): AST, ALT, ALKPHOS, BILITOT, PROT, ALBUMIN in the last 168 hours. No results for input(s): LIPASE, AMYLASE in the last 168 hours. No results for input(s): AMMONIA in the last 168 hours. Coagulation Profile: No results for input(s): INR, PROTIME in the last 168 hours. Cardiac Enzymes: No results for input(s): CKTOTAL, CKMB, CKMBINDEX, TROPONINI in the last 168 hours. BNP (last 3 results) No results for  input(s): PROBNP in the last 8760 hours. HbA1C: No results for input(s): HGBA1C in the last 72 hours. CBG: Recent Labs  Lab 08/12/20 2314 08/13/20 0314 08/13/20 0741 08/13/20 1224 08/13/20 1605  GLUCAP 95 102* 88 137* 103*   Lipid Profile: No results for input(s): CHOL, HDL, LDLCALC, TRIG, CHOLHDL, LDLDIRECT in the last 72 hours. Thyroid Function Tests: No results for input(s): TSH, T4TOTAL, FREET4, T3FREE, THYROIDAB in the last 72 hours. Anemia Panel: No results for input(s): VITAMINB12, FOLATE, FERRITIN, TIBC, IRON, RETICCTPCT in the last 72 hours. Sepsis Labs: No results for input(s): PROCALCITON, LATICACIDVEN in the last 168 hours.  No results found for this or any previous visit (from the past 240 hour(s)).    Radiology Studies: No results found. Scheduled Meds: . [MAR Hold] sodium chloride   Intravenous Once  . [MAR Hold] acetaminophen  650 mg Oral TID  . [MAR Hold] budesonide (PULMICORT) nebulizer solution  0.5 mg Nebulization BID  . [MAR Hold] calcium carbonate  1 tablet Oral Q breakfast  . [MAR Hold] chlorhexidine  15 mL Mouth Rinse BID  . [MAR Hold] Chlorhexidine Gluconate Cloth  6 each Topical Q0600  . [MAR Hold] dextromethorphan-guaiFENesin  1 tablet Oral BID  . [MAR Hold] donepezil  5 mg Oral QHS  . [MAR Hold] feeding supplement  1 Container Oral TID WC  . [MAR Hold] furosemide  40 mg Intravenous Daily  . [MAR Hold] mouth rinse  15 mL Mouth Rinse q12n4p  . [MAR Hold] memantine  5 mg Oral BID  . [MAR Hold] pantoprazole  40 mg Oral Daily  . [MAR Hold] peg 3350 powder  0.5 kit Oral Once   Continuous Infusions: . sodium chloride Stopped (08/15/20 0943)  . lactated ringers 10 mL/hr at 07/30/20 1800     LOS: 23 days    Time spent: 25 mins    Shawna Clamp, MD  [facility]   If 7PM-7AM, please contact night-coverage

## 2020-08-18 NOTE — Anesthesia Postprocedure Evaluation (Signed)
Anesthesia Post Note  Patient: Todd Lloyd  Procedure(s) Performed: ESOPHAGOGASTRODUODENOSCOPY (EGD) WITH PROPOFOL (N/A ) COLONOSCOPY WITH PROPOFOL (N/A ) BIOPSY POLYPECTOMY     Patient location during evaluation: PACU Anesthesia Type: MAC Level of consciousness: awake and alert and oriented Pain management: pain level controlled Vital Signs Assessment: post-procedure vital signs reviewed and stable Respiratory status: spontaneous breathing, nonlabored ventilation, respiratory function stable and patient connected to nasal cannula oxygen Cardiovascular status: stable and blood pressure returned to baseline Postop Assessment: no apparent nausea or vomiting Anesthetic complications: no   No complications documented.  Last Vitals:  Vitals:   08/18/20 1305 08/18/20 1347  BP: 126/66 (!) 110/91  Pulse: 92 83  Resp: (!) 21 19  Temp:  36.8 C  SpO2: 96% 95%    Last Pain:  Vitals:   08/18/20 1347  TempSrc: Oral  PainSc:                  Laurielle Selmon A.

## 2020-08-18 NOTE — Progress Notes (Signed)
Occupational Therapy Treatment Patient Details Name: Todd Lloyd MRN: [id number] DOB: Jan 14, 1941 Today's Date: 08/18/2020    History of present illness 80 yo admitted 3/7 after fall and remained on floor at home 6 hrs with rhabdomyolysis, acute respiratory failure, AMS, AKI. Hospital course complicated by increased CO2/Bipap, hypotension and GI bleed requiring transfusions PMhx; COPD, lung CA, dementia, Afib, leukemia.   OT comments  Pt making steady progress towards OT goals this session. Pt initially reports fatigue from not sleeping well, but eventually agreeable to OT intervention with encouragement. Pt able to complete UB/LB bathing from sitting at sink with MIN A for UB bathing and up to MAX A for LB bathing d/t cleanliness as pt presenting with frequent loose stools. Pt currently requires MIN guard assist for functional mobility and toilet transfers with RW and up to total A for hygiene tasks d/t cleanliness. Pt on 2L during session with sats >96% during session, Pt would continue to benefit from skilled occupational therapy while admitted and after d/c to address the below listed limitations in order to improve overall functional mobility and facilitate independence with BADL participation. DC plan remains appropriate, will follow acutely per POC.     Follow Up Recommendations  SNF;Supervision/Assistance - 24 hour    Equipment Recommendations  3 in 1 bedside commode    Recommendations for Other Services      Precautions / Restrictions Precautions Precautions: Fall Precaution Comments: watch sats and HR Restrictions Weight Bearing Restrictions: No       Mobility Bed Mobility Overal bed mobility: Needs Assistance Bed Mobility: Supine to Sit     Supine to sit: Min guard;HOB elevated     General bed mobility comments: minguard for line mgmt and safety    Transfers Overall transfer level: Needs assistance Equipment used: Rolling walker (2 wheeled) Transfers: Sit  to/from Stand Sit to Stand: Min guard         General transfer comment: pt completed multiple sit<>stands from EOB and chair with bilateral arm rests with pt needing no more than minguard for steadying assist    Balance Overall balance assessment: Needs assistance Sitting-balance support: Feet supported;No upper extremity supported Sitting balance-Leahy Scale: Fair Sitting balance - Comments: sitting EOB to don socks with no LOB   Standing balance support: No upper extremity supported;During functional activity Standing balance-Leahy Scale: Fair Standing balance comment: pt completing grooming tasks at sink with no UE support with no LOB                           ADL either performed or assessed with clinical judgement   ADL Overall ADL's : Needs assistance/impaired     Grooming: Wash/dry face;Sitting;Set up;Oral care Grooming Details (indicate cue type and reason): sitttinf in chair at sink Upper Body Bathing: Minimal assistance;Sitting Upper Body Bathing Details (indicate cue type and reason): for cleanliness Lower Body Bathing: Maximal assistance;Sit to/from stand Lower Body Bathing Details (indicate cue type and reason): MAX A for cleanliness as pt with frequent loose stools during ADLs Upper Body Dressing : Minimal assistance;Sitting   Lower Body Dressing: Min guard;Sitting/lateral leans Lower Body Dressing Details (indicate cue type and reason): to don socks from EOB Toilet Transfer: Ambulation;RW;Min guard Toilet Transfer Details (indicate cue type and reason): simulated via functional mobility from EOB<>sink with RW and min guard assist Toileting- Clothing Manipulation and Hygiene: Total assistance;Min guard;Sit to/from stand Toileting - Clothing Manipulation Details (indicate cue type and reason): total A  -  min guard for cleanliness     Functional mobility during ADLs: Min guard;Rolling walker General ADL Comments: pt completed UB/LB bathing at sink,  toileting and grooming tasks during session     Vision       Perception     Praxis      Cognition Arousal/Alertness: Awake/alert Behavior During Therapy: WFL for tasks assessed/performed Overall Cognitive Status: No family/caregiver present to determine baseline cognitive functioning                                 General Comments: pt concerned about still having loose stools during session, very attentive to level of care pt recieving, but overall very pleasant and attentive to session        Exercises     Shoulder Instructions       General Comments pt on 2L during session with sats >96% during session, difficulty getting electrodes to stick to pts chest but did note episode of Afib during bathing that resolved with seated rest break. pt continuing to produce loose stools even with colonoscopy scheduled for today, RN aware.    Pertinent Vitals/ Pain       Pain Assessment: No/denies pain  Home Living                                          Prior Functioning/Environment              Frequency  Min 2X/week        Progress Toward Goals  OT Goals(current goals can now be found in the care plan section)  Progress towards OT goals: Progressing toward goals  Acute Rehab OT Goals Patient Stated Goal: to go home to his wife after rehab OT Goal Formulation: With patient Time For Goal Achievement: 08/22/20 Potential to Achieve Goals: Good  Plan Discharge plan remains appropriate;Frequency remains appropriate    Co-evaluation                 AM-PAC OT "6 Clicks" Daily Activity     Outcome Measure   Help from another person eating meals?: None Help from another person taking care of personal grooming?: None Help from another person toileting, which includes using toliet, bedpan, or urinal?: A Little Help from another person bathing (including washing, rinsing, drying)?: A Little Help from another person to put on and  taking off regular upper body clothing?: None Help from another person to put on and taking off regular lower body clothing?: None 6 Click Score: 22    End of Session Equipment Utilized During Treatment: Rolling walker;Oxygen;Other (comment) (2L [place])  OT Visit Diagnosis: Unsteadiness on feet (R26.81);Muscle weakness (generalized) (M62.81);Other symptoms and signs involving cognitive function   Activity Tolerance Patient tolerated treatment well   Patient Left in bed;with call bell/phone within reach;with nursing/sitter in room   Nurse Communication Mobility status        Time: 6503-5465 OT Time Calculation (min): 58 min  Charges: OT General Charges $OT Visit: 1 Visit OT Treatments $Self Care/Home Management : 53-67 mins  Harley Alto., COTA/L Acute Rehabilitation Services [phone]    Precious Haws 08/18/2020, 10:35 AM

## 2020-08-18 NOTE — Transfer of Care (Signed)
Immediate Anesthesia Transfer of Care Note  Patient: Todd Lloyd  Procedure(s) Performed: ESOPHAGOGASTRODUODENOSCOPY (EGD) WITH PROPOFOL (N/A ) COLONOSCOPY WITH PROPOFOL (N/A ) BIOPSY POLYPECTOMY  Patient Location: PACU  Anesthesia Type:MAC  Level of Consciousness: patient cooperative and responds to stimulation  Airway & Oxygen Therapy: Patient Spontanous Breathing and Patient connected to nasal cannula oxygen  Post-op Assessment: Report given to RN and Post -op Vital signs reviewed and stable  Post vital signs: Reviewed and stable  Last Vitals:  Vitals Value Taken Time  BP 114/57 08/18/20 1246  Temp 36.6 C 08/18/20 1246  Pulse 94 08/18/20 1251  Resp 21 08/18/20 1251  SpO2 100 % 08/18/20 1251  Vitals shown include unvalidated device data.  Last Pain:  Vitals:   08/18/20 1246  TempSrc: Temporal  PainSc: 5       Patients Stated Pain Goal: 0 (61/68/37 2902)  Complications: No complications documented.

## 2020-08-18 NOTE — Op Note (Signed)
[facility] Patient Name: Todd Lloyd Procedure Date : 08/18/2020 MRN: [id number] Attending MD: Jackquline Denmark , MD Date of Birth: 1940/10/14 CSN: [id number] Age: 80 Admit Type: Inpatient Procedure:                Colonoscopy Indications:              Hematochezia Providers:                Jackquline Denmark, MD, Kary Kos RN, RN, Fransico Setters                            Mbumina, Technician Referring MD:              Medicines:                Monitored Anesthesia Care Complications:            No immediate complications. Estimated Blood Loss:     Estimated blood loss: none. Procedure:                Pre-Anesthesia Assessment:                           - Prior to the procedure, a History and Physical                            was performed, and patient medications and                            allergies were reviewed. The patient's tolerance of                            previous anesthesia was also reviewed. The risks                            and benefits of the procedure and the sedation                            options and risks were discussed with the patient.                            All questions were answered, and informed consent                            was obtained. Prior Anticoagulants: The patient has                            taken heparin, last dose was day of procedure. ASA                            Grade Assessment: IV - A patient with severe                            systemic disease that is a constant threat to life.  After reviewing the risks and benefits, the patient                            was deemed in satisfactory condition to undergo the                            procedure.                           After obtaining informed consent, the colonoscope                            was passed under direct vision. Throughout the                            procedure, the patient's blood pressure, pulse, and                             oxygen saturations were monitored continuously. The                            PCF-H190DL ([id number]) Olympus pediatric colonscope                            was introduced through the anus and advanced to the                            2 cm into the ileum. The colonoscopy was performed                            without difficulty. The patient tolerated the                            procedure well. The quality of the bowel                            preparation was adequate to identify polyps 6 mm                            and larger in size. The terminal ileum, ileocecal                            valve, appendiceal orifice, and rectum were                            photographed. Scope In: 12:05:49 PM Scope Out: 12:34:48 PM Scope Withdrawal Time: 0 hours 21 minutes 6 seconds  Total Procedure Duration: 0 hours 28 minutes 59 seconds  Findings:      Discontinuous areas of nonbleeding ulcerated mucosa with no stigmata of       recent bleeding were present in the cecum and another superficial       ulcerated area in the splenic flexure. The cecal mucosa was friable,       frond-like and would bleed easily. There were diverticula visualized in  the cecum. Multiple biopsies were taken with a cold forceps for       histology in separate jars.      A 8 mm polyp was found in the mid sigmoid colon. The polyp was sessile.       The polyp was removed with a cold snare. Resection and retrieval were       complete. Of note that due to quality of preparation small polyps could       have been missed.      A few medium-mouthed diverticula were found in the sigmoid colon,       ascending colon and cecum.      The terminal ileum appeared normal.      The exam was otherwise without abnormality on direct and retroflexion       views. Impression:               - Mucosal ulceration in cecum and splenic flexure.                            Biopsied. Could represent ischemic colitis. R/O                             cecal mass                           - One 8 mm polyp in the mid sigmoid colon, removed                            with a cold snare. Resected and retrieved.                           - Diverticulosis in the sigmoid colon, in the                            ascending colon and in the cecum.                           - The examined portion of the ileum was normal.                           - The examination was otherwise normal on direct                            and retroflexion views. Recommendation:           - Return patient to hospital ward for ongoing care.                           - Resume previous diet.                           - Continue present medications.                           - Follow biopsies.                           -  Resume heparin at prior dose today in 6 hrs. Can                            transition to Eliquis in a.m. if needed.                           - The findings and recommendations were discussed                            with the patient's family. Procedure Code(s):        --- Professional ---                           [phone], Colonoscopy, flexible; with removal of                            tumor(s), polyp(s), or other lesion(s) by snare                            technique                           45380, 41, Colonoscopy, flexible; with biopsy,                            single or multiple Diagnosis Code(s):        --- Professional ---                           K63.3, Ulcer of intestine                           K63.5, Polyp of colon                           K92.1, Melena (includes Hematochezia)                           K57.30, Diverticulosis of large intestine without                            perforation or abscess without bleeding CPT copyright 2019 American Medical Association. All rights reserved. The codes documented in this report are preliminary and upon coder review may  be revised to meet current compliance  requirements. Jackquline Denmark, MD 08/18/2020 4:49:59 PM This report has been signed electronically. Number of Addenda: 0

## 2020-08-18 NOTE — Interval H&P Note (Signed)
History and Physical Interval Note:  08/18/2020 11:13 AM  Todd Lloyd  has presented today for surgery, with the diagnosis of anemia, colitis, hematochezia..  The various methods of treatment have been discussed with the patient and family. After consideration of risks, benefits and other options for treatment, the patient has consented to  Procedure(s): ESOPHAGOGASTRODUODENOSCOPY (EGD) WITH PROPOFOL (N/A) COLONOSCOPY WITH PROPOFOL (N/A) as a surgical intervention.  The patient's history has been reviewed, patient examined, no change in status, stable for surgery.  I have reviewed the patient's chart and labs.  Questions were answered to the patient's satisfaction.     Jackquline Denmark

## 2020-08-18 NOTE — Op Note (Signed)
[facility] Patient Name: Todd Lloyd Procedure Date : 08/18/2020 MRN: [id number] Attending MD: Jackquline Denmark , MD Date of Birth: 1940-12-23 CSN: [id number] Age: 80 Admit Type: Inpatient Procedure:                Upper GI endoscopy Indications:              Hematochezia. R/O UGI lesions Providers:                Jackquline Denmark, MD, Kary Kos RN, RN, Janee Morn, Technician Referring MD:              Medicines:                Monitored Anesthesia Care Complications:            No immediate complications. Estimated Blood Loss:     Estimated blood loss: none. Procedure:                Pre-Anesthesia Assessment:                           - Prior to the procedure, a History and Physical                            was performed, and patient medications and                            allergies were reviewed. The patient's tolerance of                            previous anesthesia was also reviewed. The risks                            and benefits of the procedure and the sedation                            options and risks were discussed with the patient.                            All questions were answered, and informed consent                            was obtained. Prior Anticoagulants: The patient has                            taken heparin, last dose was day of procedure. ASA                            Grade Assessment: IV - A patient with severe                            systemic disease that is a constant threat to life.  After reviewing the risks and benefits, the patient                            was deemed in satisfactory condition to undergo the                            procedure.                           After obtaining informed consent, the endoscope was                            passed under direct vision. Throughout the                            procedure, the patient's blood pressure,  pulse, and                            oxygen saturations were monitored continuously. The                            GIF-H190 ([id number]) Olympus gastroscope was                            introduced through the mouth, and advanced to the                            second part of duodenum. The upper GI endoscopy was                            accomplished without difficulty. The patient                            tolerated the procedure well. Scope In: Scope Out: Findings:      The examined esophagus was normal with well-defined Z-line at 40 cm.      A small transient hiatal hernia was present. The gastric mucosa       otherwise was normal.      The examined duodenum was normal. Biopsies for histology were taken with       a cold forceps for evaluation of celiac disease. Impression:               - Small hiatal hernia.                           - Otherwise normal EGD. Recommendation:           - Return patient to hospital ward for ongoing care.                           - Resume previous diet.                           - Resume heparin at prior dose today in 6 hrs.                           -  Await pathology results.                           - Proceed with colonoscopy.                           - The findings and recommendations were discussed                            with the patient's daughter Todd Lloyd. Procedure Code(s):        --- Professional ---                           [phone], Esophagogastroduodenoscopy, flexible,                            transoral; with biopsy, single or multiple Diagnosis Code(s):        --- Professional ---                           K44.9, Diaphragmatic hernia without obstruction or                            gangrene                           K92.1, Melena (includes Hematochezia) CPT copyright 2019 American Medical Association. All rights reserved. The codes documented in this report are preliminary and upon coder review may  be revised to meet current  compliance requirements. Jackquline Denmark, MD 08/18/2020 12:03:57 PM This report has been signed electronically. Number of Addenda: 0

## 2020-08-18 NOTE — Progress Notes (Signed)
CPAP on hold tonight. Patient is continuously coughing with increase sputum production. Patient suction out mouth no complications noted. No CPAP tonight due to increase sputum production. Patient is stable at this time

## 2020-08-19 ENCOUNTER — Encounter (HOSPITAL_COMMUNITY): Payer: Self-pay | Admitting: Gastroenterology

## 2020-08-19 LAB — BASIC METABOLIC PANEL
Anion gap: 5 (ref 5–15)
BUN: 13 mg/dL (ref 8–23)
CO2: 45 mmol/L — ABNORMAL HIGH (ref 22–32)
Calcium: 8.6 mg/dL — ABNORMAL LOW (ref 8.9–10.3)
Chloride: 92 mmol/L — ABNORMAL LOW (ref 98–111)
Creatinine, Ser: 0.93 mg/dL (ref 0.61–1.24)
GFR, Estimated: >60 mL/min (ref >60)
Glucose, Bld: 108 mg/dL — ABNORMAL HIGH (ref 70–99)
Potassium: 3.6 mmol/L (ref 3.5–5.1)
Sodium: 142 mmol/L (ref 135–145)

## 2020-08-19 LAB — SURGICAL PATHOLOGY

## 2020-08-19 LAB — CBC
HCT: 27.9 % — ABNORMAL LOW (ref 39.0–52.0)
Hemoglobin: 8.3 g/dL — ABNORMAL LOW (ref 13.0–17.0)
MCH: 29.5 pg (ref 26.0–34.0)
MCHC: 29.7 g/dL — ABNORMAL LOW (ref 30.0–36.0)
MCV: 99.3 fL (ref 80.0–100.0)
Platelets: 197 10*3/uL (ref 150–400)
RBC: 2.81 MIL/uL — ABNORMAL LOW (ref 4.22–5.81)
RDW: 18.8 % — ABNORMAL HIGH (ref 11.5–15.5)
WBC: 4.4 10*3/uL (ref 4.0–10.5)
nRBC: 0 % (ref 0.0–0.2)

## 2020-08-19 LAB — RESP PANEL BY RT-PCR (FLU A&B, COVID) ARPGX2
Influenza A by PCR: NEGATIVE
Influenza B by PCR: NEGATIVE
SARS Coronavirus 2 by RT PCR: NEGATIVE

## 2020-08-19 LAB — HEPARIN LEVEL (UNFRACTIONATED)
Heparin Unfractionated: 0.35 IU/mL (ref 0.30–0.70)
Heparin Unfractionated: 0.42 IU/mL (ref 0.30–0.70)

## 2020-08-19 LAB — MAGNESIUM: Magnesium: 1.4 mg/dL — ABNORMAL LOW (ref 1.7–2.4)

## 2020-08-19 LAB — PHOSPHORUS: Phosphorus: 3.5 mg/dL (ref 2.5–4.6)

## 2020-08-19 MED ORDER — MAGNESIUM SULFATE 2 GM/50ML IV SOLN
2.0000 g | Freq: Once | INTRAVENOUS | Status: AC
  Administered 2020-08-19: 2 g via INTRAVENOUS
  Filled 2020-08-19: qty 50

## 2020-08-19 MED ORDER — APIXABAN 5 MG PO TABS
5.0000 mg | ORAL_TABLET | Freq: Two times a day (BID) | ORAL | Status: DC
  Administered 2020-08-19 – 2020-08-20 (×3): 5 mg via ORAL
  Filled 2020-08-19 (×3): qty 1

## 2020-08-19 NOTE — Progress Notes (Signed)
ANTICOAGULATION CONSULT NOTE - Follow Up Consult  Pharmacy Consult for Eliquis Indication: atrial fibrillation  Allergies  Allergen Reactions  . Lyrica [Pregabalin]     confusion  . Morphine And Related     confusion    Patient Measurements: Height: 6' (182.9 cm) Weight: 94.6 kg (208 lb 8 oz) IBW/kg (Calculated) : 77.6  Vital Signs: Temp: 98.2 F (36.8 C) (03/31 0851) Temp Source: Oral (03/31 0851) BP: 119/76 (03/31 0851) Pulse Rate: 98 (03/31 0851)  Labs: Recent Labs    08/16/20 1926 08/16/20 1926 08/17/20 0209 08/17/20 1712 08/18/20 0321 08/19/20 0240 08/19/20 0929  HGB  --    < > 8.4*  --  8.7* 8.3*  --   HCT  --   --  28.0*  --  28.3* 27.9*  --   PLT  --   --  205  --  197 197  --   APTT 83*  --  92*  --   --   --   --   HEPARINUNFRC 0.52  --  0.62   < > 0.45 0.35 0.42  CREATININE  --   --  1.15  --  0.94 0.93  --    < > = values in this interval not displayed.    Estimated Creatinine Clearance: 75.6 mL/min (by C-G formula based on SCr of 0.93 mg/dL).  Assessment: Anticoag: Eliquis PTA for Afib >> Heparin > resumed 2.5mg  BID on 3/11>>3/17 PM. Then GIB with hypotension on 3/18 and reversed with KCentra. 3/26: Eliquis 2 doses. 3/30: Start IV heparin - Hep level 0.42 in goal. Hgb 8.3 stable. Plts WNL. RN to correct weight of 208 pounds (not kg)  Goal of Therapy:  Therapeutic oral anticoagulation   Plan:  Colonoscopy report ok to resume Eliquis 3/31. Messaged Dr. Dwyane Dee D/c IV heparin and resume Eliquis 5mg  BID. AVS posted   Njeri Vicente S. Alford Highland, PharmD, BCPS Clinical Staff Pharmacist [email or web address] Alford Highland, [place] 08/19/2020,11:21 AM

## 2020-08-19 NOTE — TOC Progression Note (Addendum)
Transition of Care [facility]) - Progression Note    Patient Details  Name: Todd Lloyd MRN: [id number] Date of Birth: 1941/01/15  Transition of [facility] CM/SW [place], RN Phone Number: [phone]  08/19/2020, 11:31 AM  Clinical Narrative:    CM called [facility] to determine if bed is still available for the patient message has been left. CM spoke with daughter [name]) who verified that she is still on board for placement at [facility]. TOC will continue to follow.  [place] spoke with Star at [facility]. Per Star [facility] can accept patient tomorrow. Message has been sent to MD and bedside nurse. TOC will continue to follow.   Expected Discharge Plan: [place] Barriers to Discharge: Continued Medical Work up  Expected Discharge Plan and Services Expected Discharge Plan: [place] Choice: [place] arrangements for the past 2 months: Single Family Home                                       Social Determinants of Health (SDOH) Interventions    Readmission Risk Interventions Readmission Risk Prevention Plan 08/04/2020  Transportation Screening Complete  PCP or Specialist Appt within 5-7 Days Complete  Home Care Screening Complete  Medication Review (RN CM) Referral to Pharmacy  Some recent data might be hidden

## 2020-08-19 NOTE — Progress Notes (Signed)
Physical Therapy Treatment Patient Details Name: Todd Lloyd MRN: [id number] DOB: 25-Aug-1940 Today's Date: 08/19/2020    History of Present Illness 80 yo admitted 3/7 after fall and remained on floor at home 6 hrs with rhabdomyolysis, acute respiratory failure, AMS, AKI. Hospital course complicated by increased CO2/Bipap, hypotension and GI bleed requiring transfusions; 3/30 upper endoscopy and colonoscopy with polyp removed PMhx; COPD, lung CA, dementia, Afib, leukemia.    PT Comments    Patient requiring less physical assist and tolerating activity better than previous sessions. His lowest oxygen saturation on 3L while walking was 90% and did not drop further upon sitting to rest. He is very eager to get to rehab and mobilize more to regain his strength.    Follow Up Recommendations  SNF;Supervision/Assistance - 24 hour     Equipment Recommendations  Rolling walker with 5" wheels    Recommendations for Other Services OT consult     Precautions / Restrictions Precautions Precautions: Fall Precaution Comments: watch sats and HR    Mobility  Bed Mobility Overal bed mobility: Needs Assistance Bed Mobility: Supine to Sit     Supine to sit: HOB elevated;Supervision          Transfers Overall transfer level: Needs assistance Equipment used: Rolling walker (2 wheeled) Transfers: Sit to/from Stand Sit to Stand: Min guard         General transfer comment: pt completed multiple sit<>stands from EOB and chair with bilateral arm rests with pt needing no more than minguard for steadying assist  Ambulation/Gait Ambulation/Gait assistance: Min assist Gait Distance (Feet): 130 Feet (seated rest; 30) Assistive device: Rolling walker (2 wheeled) Gait Pattern/deviations: Step-through pattern;Decreased stride length;Trunk flexed Gait velocity: decreased   General Gait Details: pt with SpO2 92-98% on 4L with gait with cues for posture and proximity to RW; second walk on  3L with sats 90-95%   Stairs             Wheelchair Mobility    Modified Rankin (Stroke Patients Only)       Balance Overall balance assessment: Needs assistance Sitting-balance support: Feet supported;No upper extremity supported Sitting balance-Leahy Scale: Fair Sitting balance - Comments: sitting EOB to don socks with no LOB   Standing balance support: No upper extremity supported;During functional activity Standing balance-Leahy Scale: Fair Standing balance comment: pt completing grooming tasks at sink with no UE support with no LOB                            Cognition Arousal/Alertness: Awake/alert Behavior During Therapy: WFL for tasks assessed/performed Overall Cognitive Status: No family/caregiver present to determine baseline cognitive functioning Area of Impairment: Following commands                       Following Commands: Follows one step commands consistently       General Comments: brighter affect; following commands more briskly; better awareness of when to turn aroung while walking due to fatigue      Exercises Other Exercises Other Exercises: Encouraged use of IS. Pt performed 5 breaths up to 543ml. Able to verbalize how often he should do IS    General Comments General comments (skin integrity, edema, etc.): pt asking about what to expect at rehab. He is eager to do more walking and get his strength back      Pertinent Vitals/Pain Pain Assessment: Faces Faces Pain Scale: No hurt    Home  Living                      Prior Function            PT Goals (current goals can now be found in the care plan section) Acute Rehab PT Goals Patient Stated Goal: to go home to his wife after rehab Time For Goal Achievement: 08/22/20 Potential to Achieve Goals: Fair Progress towards PT goals: Progressing toward goals    Frequency    Min 2X/week      PT Plan Current plan remains appropriate    Co-evaluation               AM-PAC PT "6 Clicks" Mobility   Outcome Measure  Help needed turning from your back to your side while in a flat bed without using bedrails?: A Little Help needed moving from lying on your back to sitting on the side of a flat bed without using bedrails?: A Little Help needed moving to and from a bed to a chair (including a wheelchair)?: A Little Help needed standing up from a chair using your arms (e.g., wheelchair or bedside chair)?: A Little Help needed to walk in hospital room?: A Little Help needed climbing 3-5 steps with a railing? : A Lot 6 Click Score: 17    End of Session Equipment Utilized During Treatment: Oxygen;Gait belt Activity Tolerance: Patient limited by fatigue Patient left: in chair;with call bell/phone within reach;with chair alarm set Nurse Communication: Mobility status PT Visit Diagnosis: Other abnormalities of gait and mobility (R26.89);Difficulty in walking, not elsewhere classified (R26.2);Muscle weakness (generalized) (M62.81);Unsteadiness on feet (R26.81)     Time: 5732-2025 PT Time Calculation (min) (ACUTE ONLY): 35 min  Charges:  $Gait Training: 23-37 mins                      [name], PT Pager [phone]    Rexanne Mano 08/19/2020, 12:38 PM

## 2020-08-19 NOTE — Progress Notes (Addendum)
        Daily Rounding Note  08/19/2020, 8:40 AM  LOS: 24 days   SUBJECTIVE:   Chief complaint: Limited hematochezia.  Right sided colitis  No stools yet.  Feels okay.  OBJECTIVE:         Vital signs in last 24 hours:    Temp:  [97.9 F (36.6 C)-98.2 F (36.8 C)] 98.2 F (36.8 C) (03/30 1347) Pulse Rate:  [83-103] 86 (03/30 1614) Resp:  [14-21] 14 (03/30 1614) BP: (110-139)/(57-91) 121/61 (03/30 1614) SpO2:  [94 %-100 %] 99 % (03/30 1614) Last BM Date: 08/18/20 Filed Weights   08/15/20 0500 08/16/20 0601 08/17/20 0606  Weight: 95.5 kg 95.5 kg (!) 208.5 kg   General: Looks chronically unwell, frail. Heart: Telemetry with sinus tachycardia, occasional paired PVCs. HR ~ 110. Chest: Dyspnea with speech.  Wet vocal quality with cough. Abdomen: Soft without tenderness.  Active bowel sounds.  Obese.  Not distended. Extremities: No CCE. Neuro/Psych: Appropriate.  Alert.  Oriented.  Fluid speech.  Intake/Output from previous day: 03/30 0701 - 03/31 0700 In: 330.6 [P.O.:30; I.V.:150; IV Piggyback:150.6] Out: 1400 [Urine:1400]  Intake/Output this shift: No intake/output data recorded.  Lab Results: Recent Labs    08/17/20 0209 08/18/20 0321 08/19/20 0240  WBC 5.0 4.1 4.4  HGB 8.4* 8.7* 8.3*  HCT 28.0* 28.3* 27.9*  PLT 205 197 197   BMET Recent Labs    08/17/20 0209 08/18/20 0321 08/19/20 0240  NA 144 142 142  K 4.4 3.4* 3.6  CL 91* 90* 92*  CO2 49* 47* 45*  GLUCOSE 100* 78 108*  BUN 17 11 13  CREATININE 1.15 0.94 0.93  CALCIUM 8.4* 8.6* 8.6*   LFT No results for input(s): PROT, ALBUMIN, AST, ALT, ALKPHOS, BILITOT, BILIDIR, IBILI in the last 72 hours. PT/INR No results for input(s): LABPROT, INR in the last 72 hours. Hepatitis Panel No results for input(s): HEPBSAG, HCVAB, HEPAIGM, HEPBIGM in the last 72 hours.  Studies/Results: No results found.   Scheduled Meds: . sodium chloride   Intravenous  Once  . acetaminophen  650 mg Oral TID  . apixaban  5 mg Oral BID  . budesonide (PULMICORT) nebulizer solution  0.5 mg Nebulization BID  . calcium carbonate  1 tablet Oral Q breakfast  . chlorhexidine  15 mL Mouth Rinse BID  . Chlorhexidine Gluconate Cloth  6 each Topical Q0600  . dextromethorphan-guaiFENesin  1 tablet Oral BID  . donepezil  5 mg Oral QHS  . feeding supplement  1 Container Oral TID WC  . furosemide  40 mg Intravenous Daily  . mouth rinse  15 mL Mouth Rinse q12n4p  . memantine  5 mg Oral BID  . pantoprazole  40 mg Oral Daily  . peg 3350 powder  0.5 kit Oral Once   Continuous Infusions: . sodium chloride Stopped (08/15/20 0943)  . lactated ringers 10 mL/hr at 07/30/20 1800  . magnesium sulfate bolus IVPB     PRN Meds:.albuterol, dicyclomine, Gerhardt's butt cream, guaiFENesin, nitroGLYCERIN, polyethylene glycol   ASSESMENT:   *   Resolved hematochezia and R sided colitis 3/30 colonoscopy: Areas of nonbleeding, ulcerated mucosa in the cecum, splenic flexure, ?ischemic colitis.  8 mm polyp cold snared from sigmoid colon.  Diverticulosis involving the cecum, ascending and sigmoid colon. Path from the ulcerated mucosa and polyp are pending. 3/30 EGD: Other than small hiatal hernia this was normal study.  *    A. fib.  Chronic anticoagulation.  IV heparin restarted   last night, Team stopping heparin and restarting Eliquis today.  *    [place] anemia.  Hgbs stable in 8s.     PLAN   *   We will follow up on the pathology, hopefully will be back by the end of tomorrow. Otherwise GI moving to standby, call us if GI reinvolvement needed.  *    Would continue prophylactic Protonix 40 mg daily.  *    GI signing off.  No plans for GI office follow-up but will follow up on the pending pathology. If future GI office follow-up needed his GI primary is Dr. Daniel Jacobs.    Sarah Gribbin  08/19/2020, 8:40 AM Phone [phone]    Attending physician's note   I have taken  an interval history, reviewed the chart and examined the patient. I agree with the Advanced Practitioner's note, impression and recommendations.   Cecal ulcers Bx- ischemic Splenic Flexure ulcer- Bx- ischemic Neg SB Bx for celiac  No further bleeding. No abdo pain. Hb stable  Plan: -Can restart Eliquis -Trend CBC -If any further bleeding, can consider mesenteric angiography.  Would not recommend elective angio d/t multiple comorbid conditions. -Maintain adequate oxygenation/avoid hypotension -Will sign off for now. -D/W Cynthia.   Raj , MD [facility] [phone]  

## 2020-08-19 NOTE — Progress Notes (Signed)
PROGRESS NOTE    Todd Lloyd  LHT:[id number] DOB: Aug 05, 1940 DOA: 07/26/2020 PCP: Haywood Pao, MD    Brief Narrative:  This 80 year old male with history of COPD, stage I non-small cell lung cancer s/p radiation in 04/2020, dementia, paroxysmal A. fib on Eliquis, GERD, leukemia s/p bone marrow transplantation in 1990 was admitted to ICU under PCCM service with altered mental status after a fall and diarrhea. He was found to be in shock / respiratory failure requiring BiPAP, acute kidney injury, rhabdomyolysis.  CT of the head and C-spine were negative for acute abnormalities. CT of the abdomen and pelvis showed focal colitis without evidence of perforation. Neurology and nephrology were consulted. He was treated with bicarb drip. EEG was negative for seizures. He was treated with broad-spectrum antibiotics initially; stool was positive for rotavirus and enteropathogenic E. Coli, received Zithromax. He was treated with intravenous fluids initially. Urine output has been minimal and he has been getting intermittent Lasix recently by nephrology. Mental status has improved; Neurology signed off. Patient was transferred to [facility] service on 07/31/2020. GI has been consulted for acute on chronic blood loss anemia. Patient has had several bowel movement with thick dark blood clots.  Patient has received so far 5 units of PRBCs.  Hemoglobin today 7.5,  seems stable.  Monitor H&H.  Patient eventually underwent high risk endoscopy and colonoscopy under anesthesia.  EGD unremarkable except hiatal hernia. Colonoscopy : Mucosal ulceration in cecum and splenic flexure. Biopsied. Could represent ischemic colitis. R/O cecal mass One 8 mm polyp in the mid sigmoid colon, removed with a cold snare. Resected and Retrieved.  Patient was resumed on heparin, transitioned to Eliquis on 3/31.  Monitor for GI bleeding, monitor H&H.  If hemoglobin remains stable, no further bleeding,  Patient can be discharged to  skilled nursing home on. 4/1.  Assessment & Plan:   Principal Problem:   Acute hypercapnic respiratory failure (HCC) Active Problems:   Chronic atrial fibrillation (HCC)   COPD (chronic obstructive pulmonary disease) (HCC)   Chronic anticoagulation   Solitary pulmonary nodule   SIRS (systemic inflammatory response syndrome) (HCC)   AKI (acute kidney injury) (HCC)   Elevated brain natriuretic peptide (BNP) level   Volume overload   Hypotension   Elevated CK   Abnormal CT scan, colon   Hematochezia   Intestinal infection due to enteropathogenic E. coli   Acute on chronic blood loss anemia, with ongoing GI bleed, hypotension : -Patient presented with clots with large BM, Eliquis was placed on hold -Hemoglobin down to 6.2 on 3/19, Has received 5 units PRBC so far. Hb remained stable - Started PPI drip, consulted with Gastroenterology -Unable to get CTA abdomen due to renal dysfunction, creatinine 4.76 on admission.   -Currently not stable for tagged RBC scan with hypotension. - Kcentra was given for Eliquis reversal, last Eliquis dose  3/19. -CCM consulted for hemorrhagic shock, BP was in 80s. Now improved,. 3/21: Hemoglobin 7.5.  No further bleeding noted. GI recommended monitor H&H, continue PPI every 12h,  bleeding has stopped,  notify GI if has further bleeding. 3/22 : Repeat CT abdomen shows thickening in the descending colon. 3/23: Hemoglobin remained stable.  GI recommended resuming heparin without bolus on 3/ 25, transition to Eliquis on 3/26, if hemoglobin remains stable, consider discharge planning.  High risk colonoscopy if he bleeds. 3/25: Heparin resumed, hemoglobin remained stable 7.6. 3/26: Transitioned to Eliquis.  Hemoglobin dropped from 7.4->6.8.  Patient denies any blood in the stools. 3/27: GI  reconsulted. GI states colonoscopy would be high risk and not recommended.  Hold Eliquis for now,  rechallenge with heparin tomorrow or next day.  The risks and benefits of  long-term anticoagulation need to be discussed.  Monitor H&H. 3/28: Heparin resumed without bolus. monitor of GI bleeding. 3/29: GI planned to proceed with colonoscopy under anesthesia.  Patient has to make final decision. 3/30: Patient underwent high risk colonoscopy and EGD under general anesthesia. 3/31: Monitor for GI bleeding, monitor H&H if hemoglobin remained stable,  patient can be discharged tomorrow.  Acute kidney injury: > Resolved -Creatinine on admission 4.75 -Likely secondary from ATN, septic shock and rhabdomyolysis -Initially treated with IV fluids, subsequently with intermittent Lasix.  -Nephrology following, creatinine appears to be trending down -Renal ultrasound negative for obstruction, continue to hold losartan, avoid nephrotoxins -Renal functions improved, AKI resolved.  Acute hypoxic and hypercarbic respiratory failure: sec. to acute COPD exacerbation - On 3/17, patient found to be in hypercarbic respiratory failure, lethargic not responding to verbal commands.   - ABG showed pH of 7.2, PCO2 70.1.  Overnight patient had refused BiPAP. - Placed on BiPAP, alert and oriented this morning - 2D echo showed EF of 55 to 60%.  - Continue BiPAP QHS and PRN.   Acute metabolic encephalopathy superimposed on dementia, myoclonic jerks :> Resolved. -Likely due to hypercarbic respiratory failure, now improved, alert and oriented now.  -Patient follows neurology outpatient, Dr. [name].  -CT head negative for acute intracranial pathology -Melatonin discontinued, avoid sedating meds, BiPAP nightly -Patient had EEG during admission, negative for seizures.  -MRI was initially deferred due to rapid improvement in his mental status. -Continue Namenda, Aricept, fall precautions  Septic shock, present on admission.  Rotavirus and enteropathogenic E. coli diarrhea > Resolved. -Initially treated with aggressive IV fluid hydration, broad-spectrum antibiotics. -Also received empiric  antibiotics for meningitis due to altered mental status, DC'd on 07/27/2020. -Blood cultures, urine cultures negative, Covid/flu negative, C. difficile negative -GI PCR was positive for rotavirus/enteropathogenic E. coli, received a dose of Zithromax on 07/28/2020 -Antibiotics have been subsequently discontinued.    History of stage I right lung cancer status post XRT -Outpatient follow-up with oncology  Paroxysmal atrial fibrillation : -Rate controlled, digoxin on hold. -Continue to Hold Eliquis. -Heparin resumed on 3/25, transitioned to Eliquis on 3/26, Eliquis discontinued 3/27. - 3/28: Heparin resumed. -Heparin is on hold  4 hours prior to the procedure,  will be resumed 6 hours after procedure  Leukocytosis -Resolved  Thrombocytopenia: Resolved. monitor platelets  GERD -Continue PPI.  Hypomagnesemia: Replacement in progress. MagSO4 4 mg x 1 Recheck Labs.   Obesity Estimated body mass index is 29.74 kg/m as calculated from the following:   Height as of this encounter: 6' (1.829 m).   Weight as of this encounter: 99.5 kg.   DVT prophylaxis: SCDs Code Status: DNR Family Communication: No family at bed side, Disposition Plan:   Status is: Inpatient  Remains inpatient appropriate because:Inpatient level of care appropriate due to severity of illness   Dispo: The patient is from: Home              Anticipated d/c is to: SNF on 08/20/20              Patient currently is not medically stable to d/c.   Difficult to place patient No   Consultants:   Gastroenterology  Procedures: 2D Echo  Antimicrobials:     Anti-infectives (From admission, onward)   Start     Dose/Rate  Route Frequency Ordered Stop   07/28/20 0845  azithromycin (ZITHROMAX) tablet 1,000 mg        1,000 mg Oral  Once 07/28/20 0747 07/28/20 0827   07/28/20 0608  cefTRIAXone (ROCEPHIN) 2 g in sodium chloride 0.9 % 100 mL IVPB  Status:  Discontinued        2 g 200 mL/hr over 30 Minutes  Intravenous Every 24 hours 07/27/20 1447 07/28/20 0747   07/27/20 1045  metroNIDAZOLE (FLAGYL) IVPB 500 mg        500 mg 100 mL/hr over 60 Minutes Intravenous  Once 07/27/20 0956 07/27/20 1113   07/26/20 1930  acyclovir (ZOVIRAX) 775 mg in dextrose 5 % 150 mL IVPB  Status:  Discontinued        10 mg/kg  77.6 kg (Ideal) 165.5 mL/hr over 60 Minutes Intravenous Every 24 hours 07/26/20 1848 07/27/20 1424   07/26/20 1915  ampicillin (OMNIPEN) 2 g in sodium chloride 0.9 % 100 mL IVPB  Status:  Discontinued        2 g 300 mL/hr over 20 Minutes Intravenous Every 8 hours 07/26/20 1848 07/27/20 1424   07/26/20 1900  cefTRIAXone (ROCEPHIN) 2 g in sodium chloride 0.9 % 100 mL IVPB  Status:  Discontinued        2 g 200 mL/hr over 30 Minutes Intravenous Every 12 hours 07/26/20 1846 07/27/20 1447   07/26/20 1851  vancomycin variable dose per unstable renal function (pharmacist dosing)  Status:  Discontinued         Does not apply See admin instructions 07/26/20 1853 07/27/20 1424   07/26/20 1815  ceFEPIme (MAXIPIME) 2 g in sodium chloride 0.9 % 100 mL IVPB  Status:  Discontinued        2 g 200 mL/hr over 30 Minutes Intravenous  Once 07/26/20 1802 07/26/20 1846   07/26/20 1815  metroNIDAZOLE (FLAGYL) IVPB 500 mg        500 mg 100 mL/hr over 60 Minutes Intravenous  Once 07/26/20 1802 07/26/20 2049   07/26/20 1815  vancomycin (VANCOREADY) IVPB 1750 mg/350 mL        1,750 mg 175 mL/hr over 120 Minutes Intravenous  Once 07/26/20 1802 07/26/20 2228     Subjective: Patient was seen and examined at bedside.  Overnight events noted. Patient reports feeling better and improved.  Patient was explained about the colonoscopy and EGD report. Patient has not had a bowel movement yet, hemoglobin remained stable. Patient denies any chest pain or difficulty breathing.  Objective: Vitals:   08/19/20 0842 08/19/20 0851 08/19/20 1105 08/19/20 1224  BP:  119/76  (!) 118/59  Pulse: 97 98  92  Resp: 18 (!) 22  16   Temp:  98.2 F (36.8 C)  98 F (36.7 C)  TempSrc:  Oral  Oral  SpO2: 98% 97%  100%  Weight:   94.6 kg   Height:        Intake/Output Summary (Last 24 hours) at 08/19/2020 1409 Last data filed at 08/19/2020 1342 Gross per 24 hour  Intake 1200 ml  Output 1150 ml  Net 50 ml   Filed Weights   08/16/20 0601 08/17/20 0606 08/19/20 1105  Weight: 95.5 kg (!) 208.5 kg 94.6 kg    Examination:  General exam: Appears calm and comfortable, not in any acute distress. Respiratory system: Clear to auscultation. Respiratory effort normal. Cardiovascular system: S1 & S2 heard, RRR. No JVD, murmurs, rubs, gallops or clicks. No pedal edema. Gastrointestinal system: Abdomen is nondistended,  soft and nontender. No organomegaly or masses felt.  Normal bowel sounds heard. Central nervous system: Alert and oriented. No focal neurological deficits. Extremities: Symmetric 5 x 5 power. No edema, no cyanosis, no clubbing. Skin: No rashes, lesions or ulcers Psychiatry: Judgement and insight appear normal. Mood & affect appropriate.     Data Reviewed: I have personally reviewed following labs and imaging studies  CBC: Recent Labs  Lab 08/15/20 0026 08/15/20 1135 08/16/20 0256 08/17/20 0209 08/18/20 0321 08/19/20 0240  WBC 4.0  --  5.0 5.0 4.1 4.4  HGB 6.8* 8.2* 8.0* 8.4* 8.7* 8.3*  HCT 22.8* 27.0* 26.6* 28.0* 28.3* 27.9*  MCV 95.8  --  95.3 96.9 95.9 99.3  PLT 180  --  185 205 197 193   Basic Metabolic Panel: Recent Labs  Lab 08/13/20 0235 08/14/20 0510 08/15/20 0026 08/16/20 0256 08/17/20 0209 08/17/20 1250 08/18/20 0321 08/19/20 0240  NA 142   < > 142 141 144  --  142 142  K 4.2   < > 3.6 3.7 4.4  --  3.4* 3.6  CL 101   < > 96* 94* 91*  --  90* 92*  CO2 35*   < > 41* 43* 49*  --  47* 45*  GLUCOSE 97   < > 98 89 100*  --  78 108*  BUN 25*   < > 20 20 17   --  11 13  CREATININE 1.44*   < > 1.35* 1.25* 1.15  --  0.94 0.93  CALCIUM 8.3*   < > 8.0* 8.2* 8.4*  --  8.6* 8.6*  MG  1.3*  --  1.3* 1.3*  --  1.5* 1.4* 1.4*  PHOS 3.0  --  3.7 2.8  --   --  3.3 3.5   < > = values in this interval not displayed.   GFR: Estimated Creatinine Clearance: 75.6 mL/min (by C-G formula based on SCr of 0.93 mg/dL). Liver Function Tests: No results for input(s): AST, ALT, ALKPHOS, BILITOT, PROT, ALBUMIN in the last 168 hours. No results for input(s): LIPASE, AMYLASE in the last 168 hours. No results for input(s): AMMONIA in the last 168 hours. Coagulation Profile: No results for input(s): INR, PROTIME in the last 168 hours. Cardiac Enzymes: No results for input(s): CKTOTAL, CKMB, CKMBINDEX, TROPONINI in the last 168 hours. BNP (last 3 results) No results for input(s): PROBNP in the last 8760 hours. HbA1C: No results for input(s): HGBA1C in the last 72 hours. CBG: Recent Labs  Lab 08/13/20 0314 08/13/20 0741 08/13/20 1224 08/13/20 1605 08/18/20 1345  GLUCAP 102* 88 137* 103* 65*   Lipid Profile: No results for input(s): CHOL, HDL, LDLCALC, TRIG, CHOLHDL, LDLDIRECT in the last 72 hours. Thyroid Function Tests: No results for input(s): TSH, T4TOTAL, FREET4, T3FREE, THYROIDAB in the last 72 hours. Anemia Panel: No results for input(s): VITAMINB12, FOLATE, FERRITIN, TIBC, IRON, RETICCTPCT in the last 72 hours. Sepsis Labs: No results for input(s): PROCALCITON, LATICACIDVEN in the last 168 hours.  No results found for this or any previous visit (from the past 240 hour(s)).    Radiology Studies: No results found. Scheduled Meds: . sodium chloride   Intravenous Once  . acetaminophen  650 mg Oral TID  . apixaban  5 mg Oral BID  . budesonide (PULMICORT) nebulizer solution  0.5 mg Nebulization BID  . calcium carbonate  1 tablet Oral Q breakfast  . chlorhexidine  15 mL Mouth Rinse BID  . Chlorhexidine Gluconate Cloth  6 each  Topical Q0600  . dextromethorphan-guaiFENesin  1 tablet Oral BID  . donepezil  5 mg Oral QHS  . feeding supplement  1 Container Oral TID WC  .  furosemide  40 mg Intravenous Daily  . mouth rinse  15 mL Mouth Rinse q12n4p  . memantine  5 mg Oral BID  . pantoprazole  40 mg Oral Daily  . peg 3350 powder  0.5 kit Oral Once   Continuous Infusions: . sodium chloride Stopped (08/15/20 0943)  . lactated ringers 10 mL/hr at 07/30/20 1800     LOS: 24 days    Time spent: 25 mins    Lionell Matuszak, MD [facility]   If 7PM-7AM, please contact night-coverage

## 2020-08-19 NOTE — Progress Notes (Signed)
ANTICOAGULATION CONSULT NOTE - Follow Up Consult  Pharmacy Consult for heparin Indication: atrial fibrillation  Labs: Recent Labs    08/16/20 1926 08/16/20 1926 08/17/20 0209 08/17/20 1712 08/18/20 0321 08/19/20 0240  HGB  --    < > 8.4*  --  8.7* 8.3*  HCT  --   --  28.0*  --  28.3* 27.9*  PLT  --   --  205  --  197 197  APTT 83*  --  92*  --   --   --   HEPARINUNFRC 0.52  --  0.62 0.47 0.45 0.35  CREATININE  --   --  1.15  --  0.94  --    < > = values in this interval not displayed.    Assessment/Plan:  80yo male therapeutic on heparin after resumed s/p procedures. Will continue gtt at current rate of 1300 units/hr and confirm stable with additional level.     Wynona Neat, PharmD, BCPS  08/19/2020,3:19 AM

## 2020-08-20 DIAGNOSIS — J180 Bronchopneumonia, unspecified organism: Secondary | ICD-10-CM | POA: Diagnosis present

## 2020-08-20 DIAGNOSIS — J441 Chronic obstructive pulmonary disease with (acute) exacerbation: Secondary | ICD-10-CM | POA: Diagnosis present

## 2020-08-20 DIAGNOSIS — R262 Difficulty in walking, not elsewhere classified: Secondary | ICD-10-CM | POA: Diagnosis not present

## 2020-08-20 DIAGNOSIS — J9691 Respiratory failure, unspecified with hypoxia: Secondary | ICD-10-CM | POA: Diagnosis not present

## 2020-08-20 DIAGNOSIS — J449 Chronic obstructive pulmonary disease, unspecified: Secondary | ICD-10-CM | POA: Diagnosis not present

## 2020-08-20 DIAGNOSIS — E874 Mixed disorder of acid-base balance: Secondary | ICD-10-CM | POA: Diagnosis present

## 2020-08-20 DIAGNOSIS — Z7901 Long term (current) use of anticoagulants: Secondary | ICD-10-CM | POA: Diagnosis not present

## 2020-08-20 DIAGNOSIS — E538 Deficiency of other specified B group vitamins: Secondary | ICD-10-CM | POA: Diagnosis present

## 2020-08-20 DIAGNOSIS — I4891 Unspecified atrial fibrillation: Secondary | ICD-10-CM | POA: Diagnosis not present

## 2020-08-20 DIAGNOSIS — Z856 Personal history of leukemia: Secondary | ICD-10-CM | POA: Diagnosis not present

## 2020-08-20 DIAGNOSIS — C3491 Malignant neoplasm of unspecified part of right bronchus or lung: Secondary | ICD-10-CM | POA: Diagnosis not present

## 2020-08-20 DIAGNOSIS — I482 Chronic atrial fibrillation, unspecified: Secondary | ICD-10-CM | POA: Diagnosis present

## 2020-08-20 DIAGNOSIS — J440 Chronic obstructive pulmonary disease with acute lower respiratory infection: Secondary | ICD-10-CM | POA: Diagnosis present

## 2020-08-20 DIAGNOSIS — F0390 Unspecified dementia without behavioral disturbance: Secondary | ICD-10-CM | POA: Diagnosis present

## 2020-08-20 DIAGNOSIS — Z9481 Bone marrow transplant status: Secondary | ICD-10-CM | POA: Diagnosis not present

## 2020-08-20 DIAGNOSIS — R2689 Other abnormalities of gait and mobility: Secondary | ICD-10-CM | POA: Diagnosis not present

## 2020-08-20 DIAGNOSIS — R4182 Altered mental status, unspecified: Secondary | ICD-10-CM | POA: Diagnosis not present

## 2020-08-20 DIAGNOSIS — M2550 Pain in unspecified joint: Secondary | ICD-10-CM | POA: Diagnosis not present

## 2020-08-20 DIAGNOSIS — K922 Gastrointestinal hemorrhage, unspecified: Secondary | ICD-10-CM | POA: Diagnosis not present

## 2020-08-20 DIAGNOSIS — R531 Weakness: Secondary | ICD-10-CM | POA: Diagnosis not present

## 2020-08-20 DIAGNOSIS — J9621 Acute and chronic respiratory failure with hypoxia: Secondary | ICD-10-CM | POA: Diagnosis present

## 2020-08-20 DIAGNOSIS — Z20822 Contact with and (suspected) exposure to covid-19: Secondary | ICD-10-CM | POA: Diagnosis present

## 2020-08-20 DIAGNOSIS — E876 Hypokalemia: Secondary | ICD-10-CM | POA: Diagnosis present

## 2020-08-20 DIAGNOSIS — I517 Cardiomegaly: Secondary | ICD-10-CM | POA: Diagnosis not present

## 2020-08-20 DIAGNOSIS — Z7401 Bed confinement status: Secondary | ICD-10-CM | POA: Diagnosis not present

## 2020-08-20 DIAGNOSIS — A080 Rotaviral enteritis: Secondary | ICD-10-CM | POA: Diagnosis not present

## 2020-08-20 DIAGNOSIS — M6281 Muscle weakness (generalized): Secondary | ICD-10-CM | POA: Diagnosis not present

## 2020-08-20 DIAGNOSIS — K219 Gastro-esophageal reflux disease without esophagitis: Secondary | ICD-10-CM | POA: Diagnosis present

## 2020-08-20 DIAGNOSIS — R5381 Other malaise: Secondary | ICD-10-CM | POA: Diagnosis not present

## 2020-08-20 DIAGNOSIS — G9341 Metabolic encephalopathy: Secondary | ICD-10-CM | POA: Diagnosis present

## 2020-08-20 DIAGNOSIS — G2581 Restless legs syndrome: Secondary | ICD-10-CM | POA: Diagnosis present

## 2020-08-20 DIAGNOSIS — J40 Bronchitis, not specified as acute or chronic: Secondary | ICD-10-CM | POA: Diagnosis not present

## 2020-08-20 DIAGNOSIS — D500 Iron deficiency anemia secondary to blood loss (chronic): Secondary | ICD-10-CM | POA: Diagnosis present

## 2020-08-20 DIAGNOSIS — J9600 Acute respiratory failure, unspecified whether with hypoxia or hypercapnia: Secondary | ICD-10-CM | POA: Diagnosis not present

## 2020-08-20 DIAGNOSIS — Z923 Personal history of irradiation: Secondary | ICD-10-CM | POA: Diagnosis not present

## 2020-08-20 DIAGNOSIS — J9622 Acute and chronic respiratory failure with hypercapnia: Secondary | ICD-10-CM | POA: Diagnosis present

## 2020-08-20 DIAGNOSIS — R498 Other voice and resonance disorders: Secondary | ICD-10-CM | POA: Diagnosis not present

## 2020-08-20 DIAGNOSIS — R2681 Unsteadiness on feet: Secondary | ICD-10-CM | POA: Diagnosis not present

## 2020-08-20 DIAGNOSIS — Z7189 Other specified counseling: Secondary | ICD-10-CM | POA: Diagnosis not present

## 2020-08-20 DIAGNOSIS — Z85118 Personal history of other malignant neoplasm of bronchus and lung: Secondary | ICD-10-CM | POA: Diagnosis not present

## 2020-08-20 DIAGNOSIS — D649 Anemia, unspecified: Secondary | ICD-10-CM | POA: Diagnosis not present

## 2020-08-20 DIAGNOSIS — J9692 Respiratory failure, unspecified with hypercapnia: Secondary | ICD-10-CM | POA: Diagnosis not present

## 2020-08-20 DIAGNOSIS — Z66 Do not resuscitate: Secondary | ICD-10-CM | POA: Diagnosis present

## 2020-08-20 DIAGNOSIS — R41841 Cognitive communication deficit: Secondary | ICD-10-CM | POA: Diagnosis not present

## 2020-08-20 DIAGNOSIS — Z515 Encounter for palliative care: Secondary | ICD-10-CM | POA: Diagnosis not present

## 2020-08-20 DIAGNOSIS — J9601 Acute respiratory failure with hypoxia: Secondary | ICD-10-CM | POA: Diagnosis not present

## 2020-08-20 DIAGNOSIS — R7881 Bacteremia: Secondary | ICD-10-CM | POA: Diagnosis not present

## 2020-08-20 DIAGNOSIS — I959 Hypotension, unspecified: Secondary | ICD-10-CM | POA: Diagnosis present

## 2020-08-20 DIAGNOSIS — J9602 Acute respiratory failure with hypercapnia: Secondary | ICD-10-CM | POA: Diagnosis not present

## 2020-08-20 DIAGNOSIS — J90 Pleural effusion, not elsewhere classified: Secondary | ICD-10-CM | POA: Diagnosis not present

## 2020-08-20 DIAGNOSIS — Z885 Allergy status to narcotic agent status: Secondary | ICD-10-CM | POA: Diagnosis not present

## 2020-08-20 DIAGNOSIS — I10 Essential (primary) hypertension: Secondary | ICD-10-CM | POA: Diagnosis not present

## 2020-08-20 LAB — BASIC METABOLIC PANEL
Anion gap: 4 — ABNORMAL LOW (ref 5–15)
BUN: 13 mg/dL (ref 8–23)
CO2: 48 mmol/L — ABNORMAL HIGH (ref 22–32)
Calcium: 9.1 mg/dL (ref 8.9–10.3)
Chloride: 90 mmol/L — ABNORMAL LOW (ref 98–111)
Creatinine, Ser: 0.95 mg/dL (ref 0.61–1.24)
GFR, Estimated: >60 mL/min (ref >60)
Glucose, Bld: 99 mg/dL (ref 70–99)
Potassium: 4.1 mmol/L (ref 3.5–5.1)
Sodium: 142 mmol/L (ref 135–145)

## 2020-08-20 LAB — CBC
HCT: 28.9 % — ABNORMAL LOW (ref 39.0–52.0)
Hemoglobin: 8.7 g/dL — ABNORMAL LOW (ref 13.0–17.0)
MCH: 29.4 pg (ref 26.0–34.0)
MCHC: 30.1 g/dL (ref 30.0–36.0)
MCV: 97.6 fL (ref 80.0–100.0)
Platelets: 198 10*3/uL (ref 150–400)
RBC: 2.96 MIL/uL — ABNORMAL LOW (ref 4.22–5.81)
RDW: 18.9 % — ABNORMAL HIGH (ref 11.5–15.5)
WBC: 4.5 10*3/uL (ref 4.0–10.5)
nRBC: 0 % (ref 0.0–0.2)

## 2020-08-20 LAB — MAGNESIUM: Magnesium: 1.7 mg/dL (ref 1.7–2.4)

## 2020-08-20 LAB — PHOSPHORUS: Phosphorus: 2.7 mg/dL (ref 2.5–4.6)

## 2020-08-20 MED ORDER — PANTOPRAZOLE SODIUM 40 MG PO TBEC: 40.0000 mg | DELAYED_RELEASE_TABLET | Freq: Every day | ORAL | Status: AC

## 2020-08-20 MED ORDER — METOPROLOL TARTRATE 25 MG PO TABS
25.0000 mg | ORAL_TABLET | Freq: Two times a day (BID) | ORAL | Status: DC
  Administered 2020-08-20: 25 mg via ORAL
  Filled 2020-08-20: qty 1

## 2020-08-20 MED ORDER — BISACODYL 10 MG RE SUPP
10.0000 mg | Freq: Every day | RECTAL | Status: DC
  Administered 2020-08-20: 10 mg via RECTAL
  Filled 2020-08-20: qty 1

## 2020-08-20 MED ORDER — FUROSEMIDE 10 MG/ML IJ SOLN
20.0000 mg | Freq: Once | INTRAMUSCULAR | Status: AC
  Administered 2020-08-20: 20 mg via INTRAVENOUS
  Filled 2020-08-20: qty 2

## 2020-08-20 MED ORDER — DIGOXIN 125 MCG PO TABS
0.1250 mg | ORAL_TABLET | Freq: Every day | ORAL | Status: DC
  Administered 2020-08-20: 0.125 mg via ORAL
  Filled 2020-08-20: qty 1

## 2020-08-20 MED ORDER — FUROSEMIDE 40 MG PO TABS
40.0000 mg | ORAL_TABLET | Freq: Every day | ORAL | Status: DC
  Administered 2020-08-20: 40 mg via ORAL
  Filled 2020-08-20: qty 1

## 2020-08-20 MED ORDER — FUROSEMIDE 40 MG PO TABS: 40.0000 mg | ORAL_TABLET | Freq: Every day | ORAL | Status: DC

## 2020-08-20 MED ORDER — METOPROLOL TARTRATE 25 MG PO TABS: 25.0000 mg | ORAL_TABLET | Freq: Two times a day (BID) | ORAL | Status: DC

## 2020-08-20 NOTE — Discharge Instructions (Signed)
Follow with Primary MD [name], [name], MD in 7 days   Get CBC, CMP, 2 view Chest X ray -  checked next visit within 1 week by Primary MD or SNF MD   Activity: As tolerated with Full fall precautions use walker/cane & assistance as needed  Disposition SNF  Diet: Heart Healthy   Check your Weight same time everyday, if you gain over 2 pounds, or you develop in leg swelling, experience more shortness of breath or chest pain, call your Primary MD immediately. Follow Cardiac Low Salt Diet and 1.5 lit/day fluid restriction.  Special Instructions: If you have smoked or chewed Tobacco  in the last 2 yrs please stop smoking, stop any regular Alcohol  and or any Recreational drug use.  On your next visit with your primary care physician please Get Medicines reviewed and adjusted.  Please request your Prim.MD to go over all Hospital Tests and Procedure/Radiological results at the follow up, please get all Hospital records sent to your Prim MD by signing hospital release before you go home.  If you experience worsening of your admission symptoms, develop shortness of breath, life threatening emergency, suicidal or homicidal thoughts you must seek medical attention immediately by calling 911 or calling your MD immediately  if symptoms less severe.  You Must read complete instructions/literature along with all the possible adverse reactions/side effects for all the Medicines you take and that have been prescribed to you. Take any new Medicines after you have completely understood and accpet all the possible adverse reactions/side effects.      Information on my medicine - ELIQUIS (apixaban)  This medication education was reviewed with me or my healthcare representative as part of my discharge preparation.  The pharmacist that spoke with me during my hospital stay was:  Onnie Boer, RPH-CPP  Why was Eliquis prescribed for you? Eliquis was prescribed for you to reduce the risk of a blood clot  forming that can cause a stroke if you have a medical condition called atrial fibrillation (a type of irregular heartbeat).  What do You need to know about Eliquis ? Take your Eliquis TWICE DAILY - one tablet in the morning and one tablet in the evening with or without food. If you have difficulty swallowing the tablet whole please discuss with your pharmacist how to take the medication safely.  Take Eliquis exactly as prescribed by your doctor and DO NOT stop taking Eliquis without talking to the doctor who prescribed the medication.  Stopping may increase your risk of developing a stroke.  Refill your prescription before you run out.  After discharge, you should have regular check-up appointments with your healthcare provider that is prescribing your Eliquis.  In the future your dose may need to be changed if your kidney function or weight changes by a significant amount or as you get older.  What do you do if you miss a dose? If you miss a dose, take it as soon as you remember on the same day and resume taking twice daily.  Do not take more than one dose of ELIQUIS at the same time to make up a missed dose.  Important Safety Information A possible side effect of Eliquis is bleeding. You should call your healthcare provider right away if you experience any of the following: ? Bleeding from an injury or your nose that does not stop. ? Unusual colored urine (red or dark brown) or unusual colored stools (red or black). ? Unusual bruising for unknown reasons. ?  A serious fall or if you hit your head (even if there is no bleeding).  Some medicines may interact with Eliquis and might increase your risk of bleeding or clotting while on Eliquis. To help avoid this, consult your healthcare provider or pharmacist prior to using any new prescription or non-prescription medications, including herbals, vitamins, non-steroidal anti-inflammatory drugs (NSAIDs) and supplements.  This website has more  information on Eliquis (apixaban): http://www.eliquis.com/eliquis/home

## 2020-08-20 NOTE — Progress Notes (Signed)
Pt. Medium bowel movement no blood noted.

## 2020-08-20 NOTE — TOC Transition Note (Signed)
Transition of Care [facility]) - CM/SW Discharge Note   Patient Details  Name: Todd Lloyd MRN: [id number] Date of Birth: 22-Dec-1940  Transition of Care [facility] CM/SW Contact:  Angelita Ingles, RN Phone Number: [phone]  08/20/2020, 10:20 AM   Clinical Narrative:    Patient to be discharged to [place]. Transportation has been set up for 1:00 pickup via [facility].  No other needs noted at this time. D/c packet has been placed in the patients chart.  Please call report to  [facility] and [place] Room # [id number]    Final next level of care: Skilled Nursing Facility Barriers to Discharge: No Barriers Identified   Patient Goals and CMS Choice Patient states their goals for this hospitalization and ongoing recovery are:: Pt unable to participate in goal setting due to disorientation. CMS Medicare.gov Compare Post Acute Care list provided to:: Patient Represenative (must comment) Choice offered to / list presented to : Adult Children  Discharge Placement              Patient chooses bed at: [facility] Patient to be transferred to facility by: [place] Name of family member notified: [name] Patient and family notified of of transfer: 08/20/20  Discharge Plan and Services     Post Acute Care Choice: [place]          DME Arranged: N/A DME Agency: NA       HH Arranged: NA HH Agency: NA        Social Determinants of Health ([place]) Interventions     Readmission Risk Interventions Readmission Risk Prevention Plan 08/04/2020  Transportation Screening Complete  PCP or Specialist Appt within 5-7 Days Complete  Home Care Screening Complete  Medication Review (RN CM) Referral to Pharmacy  Some recent data might be hidden

## 2020-08-20 NOTE — Discharge Summary (Addendum)
Todd Lloyd:[id number] DOB: 1941/04/08 DOA: 07/26/2020  PCP: Haywood Pao, MD  Admit date: 07/26/2020  Discharge date: 08/20/2020  Admitted From: Home   Disposition:  SNF   Recommendations for Outpatient Follow-up:   Follow up with PCP in 1-2 weeks  PCP Please obtain BMP/CBC, 2 view CXR in 1week,  (see Discharge instructions)   PCP Please follow up on the following pending results:    Home Health: None   Equipment/Devices: None  Consultations: GI, Renal, PCCM Discharge Condition: Stable   CODE STATUS:DNR Diet Recommendation: Heart Healthy with 1.5 L total fluid restriction per day.     Chief Complaint  Patient presents with  . Fall  . Altered Mental Status     Brief history of present illness from the day of admission and additional interim summary    This 80 year old male with history of COPD, stage I non-small cell lung cancer s/p radiation in 04/2020, dementia, paroxysmal A. fib on Eliquis, GERD, leukemia s/p bone marrow transplantation in 1990 was admitted to ICU under PCCM service with altered mental status after a fall and diarrhea. He was found to be in shock / respiratory failure requiring BiPAP, acute kidney injury, rhabdomyolysis.  CT of the head and C-spine were negative for acute abnormalities. CT of the abdomen and pelvis showedfocal colitis without evidence of perforation. Neurology and nephrology were consulted. He was treated with bicarb drip. EEG was negative for seizures. He was treated with broad-spectrum antibiotics initially; stool was positive for rotavirus and enteropathogenic E. Coli,received Zithromax.  Of note patient does not have BiPAP at home and has not wearing BiPAP/CPAP in the hospital for the last 2 nights and is mentating fine.  Mentation has improved, had ?  GI Bleed and was seen by GI underwent EGD & Colonoscopy showing mucosal ulceration in the cecal area and splenic flexure, also had a 8 mm polyp removed.  There was question if he had any ischemic injury to his gut.  He is now tolerated Eliquis, stable on PPI no further evidence of blood loss.  Will be discharged to SNF.  Plan discussed with daughter in detail on 08/20/2020.                                                                 Hospital Course   Acute on chronicblood loss anemia, with ongoing GI bleed, hypotension : Patient presented with clots with large BM, he received Kcentra to reverse Eliquis along with 5 units of packed RBC and IV PPI, was seen by GI underwent EGD and colonoscopy, colonoscopy showing mucosal ulcerations in the splenic flexure area and cecal area questionable ischemic insult.  He was subsequently started back on anticoagulation with caution and PPI, H&H now stable.  He should follow with [facility] within 1 to  2 weeks.  He also had a polyp removed during his colonoscopy.    Septic shock, present on admission. Rotavirus and enteropathogenic E. coli diarrhea > Resolved.  Was treated with course of azithromycin.  This problem has resolved sepsis has involved.  AKI due to hypovolemia.  Seen by nephrology AKI has resolved.  Acute hypoxic and hypercarbic respiratory failure due to COPD exacerbation.  Initially required BiPAP, he was prescribed nighttime BiPAP but 3 nights he has not worn it.  Does not want to continue it.  Does not wear BiPAP at home.  COPD exacerbation has resolved after supportive care. Now on 2 lits o2.   Acute metabolic encephalopathy superimposed on dementia, myoclonic jerks :> Resolved, had CT head, EEG which was unremarkable.  Continue to follow outpatient with neurology post discharge, mentation back to baseline.  History of stage I right lung cancer s/p XRT.  Outpatient follow-up with primary oncologist and pulmonologist post discharge in 1 to 2  weeks  Paroxysmal A. fib [place] vascular score of greater than 3.  Continue low-dose beta-blocker digoxin along with Eliquis.  GERD. On PPI.   Discharge diagnosis     Principal Problem:   Acute hypercapnic respiratory failure (HCC) Active Problems:   Chronic atrial fibrillation (HCC)   COPD (chronic obstructive pulmonary disease) (HCC)   Chronic anticoagulation   Solitary pulmonary nodule   SIRS (systemic inflammatory response syndrome) (HCC)   AKI (acute kidney injury) (HCC)   Elevated brain natriuretic peptide (BNP) level   Volume overload   Hypotension   Elevated CK   Abnormal CT scan, colon   Hematochezia   Intestinal infection due to enteropathogenic E. coli    Discharge instructions    Discharge Instructions    Discharge instructions   Complete by: As directed    Follow with Primary MD [name], [name], MD in 7 days   Get CBC, CMP, 2 view Chest X ray -  checked next visit within 1 week by Primary MD or SNF MD   Activity: As tolerated with Full fall precautions use walker/cane & assistance as needed  Disposition SNF  Diet: Heart Healthy   Check your Weight same time everyday, if you gain over 2 pounds, or you develop in leg swelling, experience more shortness of breath or chest pain, call your Primary MD immediately. Follow Cardiac Low Salt Diet and 1.5 lit/day fluid restriction.  Special Instructions: If you have smoked or chewed Tobacco  in the last 2 yrs please stop smoking, stop any regular Alcohol  and or any Recreational drug use.  On your next visit with your primary care physician please Get Medicines reviewed and adjusted.  Please request your Prim.MD to go over all Hospital Tests and Procedure/Radiological results at the follow up, please get all Hospital records sent to your Prim MD by signing hospital release before you go home.  If you experience worsening of your admission symptoms, develop shortness of breath, life threatening emergency,  suicidal or homicidal thoughts you must seek medical attention immediately by calling 911 or calling your MD immediately  if symptoms less severe.  You Must read complete instructions/literature along with all the possible adverse reactions/side effects for all the Medicines you take and that have been prescribed to you. Take any new Medicines after you have completely understood and accpet all the possible adverse reactions/side effects.   Increase activity slowly   Complete by: As directed    No wound care   Complete by: As directed  Discharge Medications   Allergies as of 08/20/2020      Reactions   Lyrica [pregabalin]    confusion   Morphine And Related    confusion      Medication List    STOP taking these medications   losartan 25 MG tablet Commonly known as: COZAAR   omeprazole 20 MG capsule Commonly known as: PRILOSEC     TAKE these medications   albuterol 108 (90 Base) MCG/ACT inhaler Commonly known as: VENTOLIN HFA Inhale 2 puffs into the lungs every 6 (six) hours as needed for wheezing or shortness of breath.   apixaban 5 MG Tabs tablet Commonly known as: Eliquis Take 1 tablet (5 mg total) by mouth 2 (two) times daily.   arformoterol 15 MCG/2ML Nebu Commonly known as: BROVANA Take 2 mLs (15 mcg total) by nebulization 2 (two) times daily. Dx: J44.9, file under Part B   Baclofen 5 MG Tabs TAKE 1 TABLET BY MOUTH TWICE A DAY   digoxin 0.25 MG tablet Commonly known as: LANOXIN Take 250 mcg by mouth daily.   donepezil 10 MG tablet Commonly known as: ARICEPT Take 10 mg by mouth at bedtime.   furosemide 40 MG tablet Commonly known as: LASIX Take 1 tablet (40 mg total) by mouth daily. Start taking on: August 21, 2020 What changed:   medication strength  how much to take   memantine 5 MG tablet Commonly known as: NAMENDA Take 5 mg by mouth 2 (two) times daily.   metoprolol tartrate 25 MG tablet Commonly known as: LOPRESSOR Take 1 tablet (25 mg  total) by mouth 2 (two) times daily.   mometasone 220 MCG/INH inhaler Commonly known as: ASMANEX Inhale 2 puffs into the lungs daily.   pantoprazole 40 MG tablet Commonly known as: PROTONIX Take 1 tablet (40 mg total) by mouth daily. Start taking on: August 21, 2020   pramipexole 1 MG tablet Commonly known as: MIRAPEX Take 1 tablet (1 mg total) by mouth 2 (two) times daily.   Tiotropium Bromide-Olodaterol 2.5-2.5 MCG/ACT Aers Commonly known as: Stiolto Respimat Inhale 2 puffs into the lungs daily.        Contact information for follow-up providers    Reesa Chew, MD Follow up on 09/02/2020.   Specialty: Internal Medicine Why: Appointment at 9:30AM with labs 1 week prior Contact information: [place] [address] [phone]        [name], [name], MD. Schedule an appointment as soon as possible for a visit in 1 week(s).   Specialty: Internal Medicine Contact information: [place] [address] [phone]        Skeet Latch, MD. Schedule an appointment as soon as possible for a visit in 1 week(s).   Specialty: Cardiology Contact information: [address] [place] [address] [phone]        Jackquline Denmark, MD. Schedule an appointment as soon as possible for a visit in 1 week(s).   Specialties: Gastroenterology, Internal Medicine Contact information: [place] [address] [phone]            Contact information for after-discharge care    Destination    [facility] .   Service: Skilled Nursing Contact information: [place] [phone]                  Major procedures and Radiology Reports - PLEASE review detailed and final reports thoroughly  -  CT ABDOMEN PELVIS WO CONTRAST  Result Date: 08/10/2020 CLINICAL DATA:  Anemia, colitis. EXAM: CT ABDOMEN AND PELVIS WITHOUT  CONTRAST TECHNIQUE: Multidetector CT imaging of the abdomen and pelvis was performed following the standard protocol without IV contrast. COMPARISON:  July 26, 2020. FINDINGS: Lower chest: Bilateral pleural effusions are noted with adjacent subsegmental atelectasis. Hepatobiliary: No focal liver abnormality is seen. No gallstones, gallbladder wall thickening, or biliary dilatation. Pancreas: Unremarkable. No pancreatic ductal dilatation or surrounding inflammatory changes. Spleen: Normal in size without focal abnormality. Adrenals/Urinary Tract: Stable large calcification involving left adrenal gland consistent with old hematoma. Right adrenal gland appears normal. Small nonobstructive left renal calculus is noted. No hydronephrosis or renal obstruction is noted. Urinary bladder is decompressed secondary to Foley catheter. Stomach/Bowel: The stomach appears normal. There is no evidence of bowel obstruction. Status post appendectomy. There remains wall thickening involving the proximal os ascending colon, but it appears to be significantly improved compared to prior exam. Vascular/Lymphatic: Aortic atherosclerosis. No enlarged abdominal or pelvic lymph nodes. Reproductive: Prostate is unremarkable. Other: Mild anasarca is noted.  No ascites or hernia is noted. Musculoskeletal: No acute or significant osseous findings. IMPRESSION: 1. Bilateral pleural effusions are noted with adjacent subsegmental atelectasis. 2. Stable large calcification involving left adrenal gland consistent with old hematoma. 3. Small nonobstructive left renal calculus. No hydronephrosis or renal obstruction is noted. 4. There remains wall thickening involving the proximal ascending colon, but it appears to be significantly improved compared to prior exam. 5. Mild anasarca is noted. 6. Aortic atherosclerosis. Aortic Atherosclerosis (ICD10-I70.0). Electronically Signed   By: Marijo Conception M.D.   On: 08/10/2020 19:25   CT HEAD WO  CONTRAST  Result Date: 08/05/2020 CLINICAL DATA:  Expressive aphasia with stroke suspected EXAM: CT HEAD WITHOUT CONTRAST TECHNIQUE: Contiguous axial images were obtained from the base of the skull through the vertex without intravenous contrast. COMPARISON:  Ten days ago FINDINGS: Brain: No evidence of acute infarction, hemorrhage, hydrocephalus, extra-axial collection or mass lesion/mass effect. Generalized atrophy. Age normal appearance of white matter. Vascular: No hyperdense vessel or unexpected calcification. Skull: Normal. Negative for fracture or focal lesion. Sinuses/Orbits: No acute finding. Bilateral scleral banding and cataract resection. IMPRESSION: Stable head CT. No visible infarct or other change from recent comparison. Electronically Signed   By: Monte Fantasia M.D.   On: 08/05/2020 05:01   CT HEAD WO CONTRAST  Result Date: 07/26/2020 CLINICAL DATA:  Trauma. Unwitnessed fall. EXAM: CT HEAD WITHOUT CONTRAST CT CERVICAL SPINE WITHOUT CONTRAST TECHNIQUE: Multidetector CT imaging of the head and cervical spine was performed following the standard protocol without intravenous contrast. Multiplanar CT image reconstructions of the cervical spine were also generated. COMPARISON:  CT cervical spine August 30, 2011. FINDINGS: CT HEAD FINDINGS Brain: No evidence of acute large vascular territory infarction, hemorrhage, hydrocephalus, extra-axial collection or mass lesion/mass effect. Mild for age scattered white matter hypodensities, most likely related to chronic microvascular ischemic disease. Mild generalized cerebral atrophy with ex vacuo ventricular dilation. Vascular: Calcific atherosclerosis. No hyperdense vessel identified. Skull: No acute fracture.  Left frontal scalp contusion Sinuses/Orbits: Visualized sinuses are clear. Bilateral scleral buckles. Other: No mastoid effusions. CT CERVICAL SPINE FINDINGS Alignment: Mild anterolisthesis of C4 on C5, favor degenerative given facet arthropathy at  this level. Broad dextrocurvature of the cervical spine. Skull base and vertebrae: Vertebral body heights are maintained. No evidence of acute fracture. Soft tissues and spinal canal: No prevertebral fluid or swelling. No visible canal hematoma. Disc levels: Left eccentric degenerative disc  disease in the lower cervical spine, greatest at C7-T1 with disc height loss and endplate sclerosis. Bulky facet hypertrophy at multiple levels, greatest on the left at C4-C5 where there is likely at least moderate bony foraminal stenosis. Upper chest: Emphysema. Lung apices appear clear although evaluation is limited by motion. IMPRESSION: CT head: 1. No evidence of acute intracranial abnormality. 2. Left frontal scalp contusion without acute fracture. CT cervical spine: 1. No evidence of acute fracture. 2. Rotation of C1 on C2 is likely positional in the absence of a fixed torticollis. 3. Bulky facet hypertrophy at multiple levels, greatest on the left at C4-C5 where there is likely at least moderate bony foraminal stenosis. 4. Left eccentric degenerative disc disease in the lower cervical spine, greatest at C7-T1. Electronically Signed   By: Margaretha Sheffield MD   On: 07/26/2020 17:59   CT CERVICAL SPINE WO CONTRAST  Result Date: 07/26/2020 CLINICAL DATA:  Trauma. Unwitnessed fall. EXAM: CT HEAD WITHOUT CONTRAST CT CERVICAL SPINE WITHOUT CONTRAST TECHNIQUE: Multidetector CT imaging of the head and cervical spine was performed following the standard protocol without intravenous contrast. Multiplanar CT image reconstructions of the cervical spine were also generated. COMPARISON:  CT cervical spine August 30, 2011. FINDINGS: CT HEAD FINDINGS Brain: No evidence of acute large vascular territory infarction, hemorrhage, hydrocephalus, extra-axial collection or mass lesion/mass effect. Mild for age scattered white matter hypodensities, most likely related to chronic microvascular ischemic disease. Mild generalized cerebral atrophy  with ex vacuo ventricular dilation. Vascular: Calcific atherosclerosis. No hyperdense vessel identified. Skull: No acute fracture.  Left frontal scalp contusion Sinuses/Orbits: Visualized sinuses are clear. Bilateral scleral buckles. Other: No mastoid effusions. CT CERVICAL SPINE FINDINGS Alignment: Mild anterolisthesis of C4 on C5, favor degenerative given facet arthropathy at this level. Broad dextrocurvature of the cervical spine. Skull base and vertebrae: Vertebral body heights are maintained. No evidence of acute fracture. Soft tissues and spinal canal: No prevertebral fluid or swelling. No visible canal hematoma. Disc levels: Left eccentric degenerative disc disease in the lower cervical spine, greatest at C7-T1 with disc height loss and endplate sclerosis. Bulky facet hypertrophy at multiple levels, greatest on the left at C4-C5 where there is likely at least moderate bony foraminal stenosis. Upper chest: Emphysema. Lung apices appear clear although evaluation is limited by motion. IMPRESSION: CT head: 1. No evidence of acute intracranial abnormality. 2. Left frontal scalp contusion without acute fracture. CT cervical spine: 1. No evidence of acute fracture. 2. Rotation of C1 on C2 is likely positional in the absence of a fixed torticollis. 3. Bulky facet hypertrophy at multiple levels, greatest on the left at C4-C5 where there is likely at least moderate bony foraminal stenosis. 4. Left eccentric degenerative disc disease in the lower cervical spine, greatest at C7-T1. Electronically Signed   By: Margaretha Sheffield MD   On: 07/26/2020 17:59   US RENAL  Result Date: 07/28/2020 CLINICAL DATA:  Acute kidney injury EXAM: RENAL / URINARY TRACT ULTRASOUND COMPLETE COMPARISON:  None. FINDINGS: Right Kidney: Renal measurements: 10 x 6.2 x 5.7 cm = volume: 184.4 mL. Echogenicity within normal limits. No mass or hydronephrosis visualized. Left Kidney: Renal measurements: 10.2 x 6.6 x 6 cm = volume: 211.7 mL.  Echogenicity within normal limits. 1.8 cm cyst at the upper pole. No hydronephrosis visualized. Bladder: Decompressed and not visualized Other: None. IMPRESSION: No hydronephrosis. Electronically Signed   By: Macy Mis M.D.   On: 07/28/2020 16:23   DG Pelvis Portable  Result Date: 07/26/2020 CLINICAL DATA:  Recent fall with pelvic pain, initial encounter EXAM: PORTABLE PELVIS 1-2 VIEWS COMPARISON:  02/24/2020 FINDINGS: Pelvic ring is intact. No acute fracture or dislocation is noted. Postsurgical changes are seen. No soft tissue abnormality is noted. Degenerative changes of lumbar spine are noted as well. Left upper quadrant calcification is noted similar to that seen on prior PET-CT. IMPRESSION: No acute abnormality noted. Electronically Signed   By: Inez Catalina M.D.   On: 07/26/2020 17:55   DG Chest Port 1 View  Result Date: 07/30/2020 CLINICAL DATA:  Respiratory failure EXAM: PORTABLE CHEST 1 VIEW COMPARISON:  07/26/2020 FINDINGS: The lungs are symmetrically well inflated. Minimal left basilar atelectasis. Mild coarsening of the pulmonary interstitium is again noted. No pneumothorax or pleural effusion. Cardiac size is mildly enlarged. Pulmonary vasculature is normal. No acute bone abnormality. IMPRESSION: No active disease.  Stable cardiomegaly. Electronically Signed   By: Fidela Salisbury MD   On: 07/30/2020 07:04   DG Chest Port 1 View  Result Date: 07/26/2020 CLINICAL DATA:  Recent fall EXAM: PORTABLE CHEST 1 VIEW COMPARISON:  07/06/2020 FINDINGS: Cardiac shadow is mildly enlarged but stable. Aortic calcifications are seen. Known right lower lobe mass lesion is not well appreciated on this exam. No infiltrate or sizable effusion is seen. No acute bony abnormality is noted. IMPRESSION: No acute abnormality noted. Electronically Signed   By: Inez Catalina M.D.   On: 07/26/2020 17:57   EEG adult  Result Date: 07/27/2020 Lora Havens, MD     07/27/2020 10:35 AM Patient Name: Todd Lloyd  MRN: [id number] Epilepsy Attending: Lora Havens Referring Physician/Provider: Dr. Kerney Elbe Date: 07/27/2020 Duration: 23.04 mins  Patient history: 80yo M with ams. EEG to evaluate for seizure  Level of alertness: Awake  AEDs during EEG study: None  Technical aspects: This EEG study was done with scalp electrodes positioned according to the 10-20 International system of electrode placement. Electrical activity was acquired at a sampling rate of 500Hz  and reviewed with a high frequency filter of 70Hz  and a low frequency filter of 1Hz . EEG data were recorded continuously and digitally stored.  Description: EEG showed continuous generalized 4.5-5Hz  theta slowing. Hyperventilation and photic stimulation were not performed.    ABNORMALITY -Continuous slow, generalized  IMPRESSION: This study is suggestive of moderate diffuse encephalopathy, nonspecific etiology. No seizures or epileptiform discharges were seen throughout the recording.   Priyanka O Yadav   VAS [place] LOWER EXTREMITY VENOUS (DVT)  Result Date: 08/09/2020  Lower Venous DVT Study Indications: Swelling, and Pain.  Comparison Study: No prior study Performing Technologist: Sharion Dove RVS  Examination Guidelines: A complete evaluation includes B-mode imaging, spectral Doppler, color Doppler, and power Doppler as needed of all accessible portions of each vessel. Bilateral testing is considered an integral part of a complete examination. Limited examinations for reoccurring indications may be performed as noted. The reflux portion of the exam is performed with the patient in reverse Trendelenburg.  +-----+---------------+---------+-----------+----------+--------------+ RIGHTCompressibilityPhasicitySpontaneityPropertiesThrombus Aging +-----+---------------+---------+-----------+----------+--------------+ CFV  Full           Yes      Yes                                  +-----+---------------+---------+-----------+----------+--------------+   +---------+---------------+---------+-----------+----------+--------------+ LEFT     CompressibilityPhasicitySpontaneityPropertiesThrombus Aging +---------+---------------+---------+-----------+----------+--------------+ CFV      Full           Yes      Yes                                 +---------+---------------+---------+-----------+----------+--------------+  SFJ      Full                                                        +---------+---------------+---------+-----------+----------+--------------+ FV Prox  Full                                                        +---------+---------------+---------+-----------+----------+--------------+ FV Mid   Full                                                        +---------+---------------+---------+-----------+----------+--------------+ FV DistalFull                                                        +---------+---------------+---------+-----------+----------+--------------+ PFV      Full                                                        +---------+---------------+---------+-----------+----------+--------------+ POP      Full           Yes      Yes                                 +---------+---------------+---------+-----------+----------+--------------+ PTV      Full                                                        +---------+---------------+---------+-----------+----------+--------------+ PERO     Full                                                        +---------+---------------+---------+-----------+----------+--------------+     Summary: RIGHT: - No evidence of common femoral vein obstruction.  LEFT: - There is no evidence of deep vein thrombosis in the lower extremity.  *See table(s) above for measurements and observations. Electronically signed by Servando Snare MD on 08/09/2020 at 11:19:19 AM.     Final    ECHOCARDIOGRAM LIMITED  Result Date: 07/27/2020    ECHOCARDIOGRAM LIMITED REPORT   Patient Name:   DREXLER MALAND Date of Exam: 07/27/2020 Medical Rec #:  [id number]          Height:       72.0 in Accession #:    [id number]  Weight:       214.5 lb Date of Birth:  November 26, 1940          BSA:          2.195 m Patient Age:    80 years           BP:           105/57 mmHg Patient Gender: M                  HR:           77 bpm. Exam Location:  Inpatient Procedure: Limited Echo, Cardiac Doppler and Color Doppler Indications:    Congestive Heart Failure I50.9  History:        Patient has prior history of Echocardiogram examinations, most                 recent 03/03/2020. COPD, Arrythmias:Atrial Fibrillation; Risk                 Factors:Hypertension, Dyslipidemia and Former Smoker.  Sonographer:    Vickie Epley RDCS Referring Phys: [place]  1. Left ventricular ejection fraction, by estimation, is 55 to 60%. The left ventricle has normal function. Left ventricular diastolic function could not be evaluated.  2. The mitral valve is grossly normal. No evidence of mitral stenosis.  3. Tricuspid valve regurgitation is mild to moderate.  4. The aortic valve is grossly normal. Aortic valve regurgitation is trivial. No aortic stenosis is present.  5. There is moderately elevated pulmonary artery systolic pressure. FINDINGS  Left Ventricle: Left ventricular ejection fraction, by estimation, is 55 to 60%. The left ventricle has normal function. Left ventricular diastolic function could not be evaluated. Left ventricular diastolic function could not be evaluated due to atrial  fibrillation. Right Ventricle: There is moderately elevated pulmonary artery systolic pressure. The tricuspid regurgitant velocity is 2.77 m/s, and with an assumed right atrial pressure of 15 mmHg, the estimated right ventricular systolic pressure is 24.4 mmHg. Mitral Valve: The mitral valve is grossly normal. No  evidence of mitral valve stenosis. Tricuspid Valve: The tricuspid valve is normal in structure. Tricuspid valve regurgitation is mild to moderate. Aortic Valve: The aortic valve is grossly normal. Aortic valve regurgitation is trivial. No aortic stenosis is present. Pulmonic Valve: The pulmonic valve was normal in structure. Pulmonic valve regurgitation is trivial. Aorta: The aortic root and ascending aorta are structurally normal, with no evidence of dilitation. LEFT VENTRICLE PLAX 2D LVIDd:         5.00 cm LVIDs:         3.80 cm LV PW:         1.00 cm LV IVS:        1.00 cm LVOT diam:     2.40 cm LVOT Area:     4.52 cm  LV Volumes (MOD) LV vol d, MOD A2C: 112.0 ml LV vol d, MOD A4C: 101.0 ml LV vol s, MOD A2C: 56.8 ml LV vol s, MOD A4C: 55.4 ml LV SV MOD A2C:     55.2 ml LV SV MOD A4C:     101.0 ml LV SV MOD BP:      49.6 ml RIGHT VENTRICLE TAPSE (M-mode): 1.5 cm LEFT ATRIUM         Index LA diam:    5.10 cm 2.32 cm/m   AORTA Ao Root diam: 3.80 cm Ao Asc diam:  3.60 cm TRICUSPID VALVE TR Peak grad:   30.7  mmHg TR Vmax:        277.00 cm/s  SHUNTS Systemic Diam: 2.40 cm Mertie Moores MD Electronically signed by Mertie Moores MD Signature Date/Time: 07/27/2020/3:32:59 PM    Final    CT CHEST ABDOMEN PELVIS WO CONTRAST  Result Date: 07/26/2020 CLINICAL DATA:  Sepsis and leukocytosis, recent fall, history of lung carcinoma EXAM: CT CHEST, ABDOMEN AND PELVIS WITHOUT CONTRAST TECHNIQUE: Multidetector CT imaging of the chest, abdomen and pelvis was performed following the standard protocol without IV contrast. COMPARISON:  Chest x-ray and pelvis film from earlier in the same day., 07/06/2020 FINDINGS: CT CHEST FINDINGS Cardiovascular: Atherosclerotic calcifications of the aorta are noted without aneurysmal dilatation. No cardiac enlargement is seen. No pericardial effusion is noted. 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 Mediastinum/Nodes: Thoracic inlet is within normal limits. No sizable hilar or mediastinal  adenopathy is noted. The esophagus as visualized is within normal limits. Lungs/Pleura: Left lung is well aerated with mild emphysematous changes. No focal infiltrate or sizable effusion is seen. Previously seen bilobed right lower lobe mass lesion is again identified and stable. Small right-sided pleural effusion is noted stable from the prior exam. Musculoskeletal: Degenerative changes of the thoracic spine are noted. No acute rib abnormality is seen. CT ABDOMEN PELVIS FINDINGS Hepatobiliary: No focal liver abnormality is seen. No gallstones, gallbladder wall thickening, or biliary dilatation. Pancreas: Unremarkable. No pancreatic ductal dilatation or surrounding inflammatory changes. Spleen: Normal in size without focal abnormality. Adrenals/Urinary Tract: Adrenal glands again demonstrate calcification on the left consistent with prior adrenal hematoma. The overall appearance is stable. Right adrenal gland is within normal limits. Kidneys are well visualized bilaterally. Tiny nonobstructing left renal stones are noted. Ureters are within normal limits. Bladder is decompressed by Foley catheter. No obstructive changes are seen. Stomach/Bowel: Colon is well visualized with some fluid within. Considerable wall thickening is noted within the ascending colon new from prior PET-CT from 02/24/2020. These changes are consistent with focal colitis without evidence of perforation. Some pericolonic inflammatory changes are seen. Small bowel and stomach appear within normal limits. The appendix is not well visualized. Vascular/Lymphatic: Aortic atherosclerosis. No enlarged abdominal or pelvic lymph nodes. Reproductive: Prostate is unremarkable. Other: No abdominal wall hernia or abnormality. No abdominopelvic ascites. Musculoskeletal: No acute or significant osseous findings. IMPRESSION: CT of the chest: Stable right lower lobe mass lesion with associated right effusion similar to that seen on the prior exam. CT of the  abdomen and pelvis: Tiny nonobstructing left renal stone. Changes of ascending colitis without evidence of perforation or abscess formation. Stable calcified left adrenal hematoma. Aortic Atherosclerosis (ICD10-I70.0) and Emphysema (ICD10-J43.9). Electronically Signed   By: Inez Catalina M.D.   On: 07/26/2020 23:08    Micro Results     Recent Results (from the past 240 hour(s))  Resp Panel by RT-PCR (Flu A&B, Covid) Nasopharyngeal Swab     Status: None   Collection Time: 08/19/20 12:31 PM   Specimen: Nasopharyngeal Swab; Nasopharyngeal(NP) swabs in vial transport medium  Result Value Ref Range Status   SARS Coronavirus 2 by RT PCR NEGATIVE NEGATIVE Final    Comment: (NOTE) SARS-CoV-2 target nucleic acids are NOT DETECTED.  The SARS-CoV-2 RNA is generally detectable in upper respiratory specimens during the acute phase of infection. The lowest concentration of SARS-CoV-2 viral copies this assay can detect is 138 copies/mL. A negative result does not preclude SARS-Cov-2 infection and should not be used as the sole basis for treatment or other patient management decisions. A negative result may occur  with  improper specimen collection/handling, submission of specimen other than nasopharyngeal swab, presence of viral mutation(s) within the areas targeted by this assay, and inadequate number of viral copies(<138 copies/mL). A negative result must be combined with clinical observations, patient history, and epidemiological information. The expected result is Negative.  Fact Sheet for Patients:  EntrepreneurPulse.com.au  Fact Sheet for Healthcare Providers:  IncredibleEmployment.be  This test is no t yet approved or cleared by the [place] FDA and  has been authorized for detection and/or diagnosis of SARS-CoV-2 by FDA under an Emergency Use Authorization (EUA). This EUA will remain  in effect (meaning this test can be used) for the duration of  the COVID-19 declaration under Section 564(b)(1) of the Act, 21 U.S.C.section 360bbb-3(b)(1), unless the authorization is terminated  or revoked sooner.       Influenza A by PCR NEGATIVE NEGATIVE Final   Influenza B by PCR NEGATIVE NEGATIVE Final    Comment: (NOTE) The Xpert Xpress SARS-CoV-2/FLU/RSV plus assay is intended as an aid in the diagnosis of influenza from Nasopharyngeal swab specimens and should not be used as a sole basis for treatment. Nasal washings and aspirates are unacceptable for Xpert Xpress SARS-CoV-2/FLU/RSV testing.  Fact Sheet for Patients: EntrepreneurPulse.com.au  Fact Sheet for Healthcare Providers: IncredibleEmployment.be  This test is not yet approved or cleared by the [place] FDA and has been authorized for detection and/or diagnosis of SARS-CoV-2 by FDA under an Emergency Use Authorization (EUA). This EUA will remain in effect (meaning this test can be used) for the duration of the COVID-19 declaration under Section 564(b)(1) of the Act, 21 U.S.C. section 360bbb-3(b)(1), unless the authorization is terminated or revoked.  Performed at [facility], [place] [address] [place], [place] [address]     Today   Subjective    Daqwan Dougal today has no headache,no chest abdominal pain,no new weakness tingling or numbness, feels much better wants to the SNF.   Objective   Blood pressure 135/80, pulse 90, temperature 98 F (36.7 C), temperature source Oral, resp. rate 20, height 6' (1.829 m), weight 87.8 kg, SpO2 97 %.   Intake/Output Summary (Last 24 hours) at 08/20/2020 0858 Last data filed at 08/20/2020 0757 Gross per 24 hour  Intake 1200 ml  Output 2400 ml  Net -1200 ml    Exam  Awake Alert, No new F.N deficits,   [place].AT,PERRAL Supple Neck,No JVD, No cervical lymphadenopathy appriciated.  Symmetrical Chest wall movement, Good air movement bilaterally, CTAB RRR,No Gallops,Rubs or new  Murmurs, No Parasternal Heave +ve B.Sounds, Abd Soft, Non tender, No organomegaly appriciated, No rebound -guarding or rigidity. No Cyanosis, Clubbing or edema, No new Rash or bruise   Data Review   CBC w Diff:  Lab Results  Component Value Date   WBC 4.5 08/20/2020   HGB 8.7 (L) 08/20/2020   HGB 13.4 10/02/2018   HCT 28.9 (L) 08/20/2020   HCT 39.3 10/02/2018   PLT 198 08/20/2020   PLT 201 10/02/2018   LYMPHOPCT 9.6 (L) 03/19/2015   MONOPCT 10.5 02/19/2017   EOSPCT 0.9 02/19/2017   BASOPCT 0.3 02/19/2017    CMP:  Lab Results  Component Value Date   NA 142 08/20/2020   NA 144 03/19/2019   K 4.1 08/20/2020   CL 90 (L) 08/20/2020   CO2 48 (H) 08/20/2020   BUN 13 08/20/2020   BUN 13 03/19/2019   CREATININE 0.95 08/20/2020   CREATININE 1.08 07/06/2020   CREATININE 1.15 02/19/2017   PROT  4.6 (L) 08/09/2020   PROT 5.9 (L) 03/19/2019   ALBUMIN 2.4 (L) 08/09/2020   ALBUMIN 4.2 03/19/2019   BILITOT 0.6 08/09/2020   BILITOT 0.4 03/19/2019   ALKPHOS 99 08/09/2020   AST 41 08/09/2020   ALT 40 08/09/2020  .   Total Time in preparing paper work, data evaluation and todays exam - 79 minutes  Lala Lund M.D on 08/20/2020 at 8:58 AM  [facility]

## 2020-08-20 NOTE — Progress Notes (Signed)
Report called to Lilly at [facility].

## 2020-08-20 NOTE — Plan of Care (Signed)
  Problem: Education: Goal: Knowledge of General Education information will improve Description: Including pain rating scale, medication(s)/side effects and non-pharmacologic comfort measures Outcome: Adequate for Discharge   Problem: Health Behavior/Discharge Planning: Goal: Ability to manage health-related needs will improve Outcome: Adequate for Discharge   Problem: Clinical Measurements: Goal: Ability to maintain clinical measurements within normal limits will improve Outcome: Adequate for Discharge Goal: Will remain free from infection Outcome: Adequate for Discharge Goal: Diagnostic test results will improve Outcome: Adequate for Discharge Goal: Respiratory complications will improve Outcome: Adequate for Discharge Goal: Cardiovascular complication will be avoided Outcome: Adequate for Discharge   Problem: Activity: Goal: Risk for activity intolerance will decrease Outcome: Adequate for Discharge   Problem: Nutrition: Goal: Adequate nutrition will be maintained Outcome: Adequate for Discharge   Problem: Coping: Goal: Level of anxiety will decrease Outcome: Adequate for Discharge   Problem: Elimination: Goal: Will not experience complications related to bowel motility Outcome: Adequate for Discharge Goal: Will not experience complications related to urinary retention Outcome: Adequate for Discharge   Problem: Pain Managment: Goal: General experience of comfort will improve Outcome: Adequate for Discharge   Problem: Safety: Goal: Ability to remain free from injury will improve Outcome: Adequate for Discharge   Problem: Skin Integrity: Goal: Risk for impaired skin integrity will decrease Outcome: Adequate for Discharge   Problem: Education: Goal: Required Educational Video(s) Outcome: Adequate for Discharge   Problem: Clinical Measurements: Goal: Postoperative complications will be avoided or minimized Outcome: Adequate for Discharge   Problem: Skin  Integrity: Goal: Demonstration of wound healing without infection will improve Outcome: Adequate for Discharge   Problem: Education: Goal: Ability to identify signs and symptoms of gastrointestinal bleeding will improve Outcome: Adequate for Discharge   Problem: Bowel/Gastric: Goal: Will show no signs and symptoms of gastrointestinal bleeding Outcome: Adequate for Discharge   Problem: Fluid Volume: Goal: Will show no signs and symptoms of excessive bleeding Outcome: Adequate for Discharge   Problem: Clinical Measurements: Goal: Complications related to the disease process, condition or treatment will be avoided or minimized Outcome: Adequate for Discharge

## 2020-08-23 ENCOUNTER — Inpatient Hospital Stay (HOSPITAL_COMMUNITY)
Admission: EM | Admit: 2020-08-23 | Discharge: 2020-08-27 | DRG: 193 | Disposition: A | Discharge status: Skilled Nursing Facility | Payer: Medicare Other | Source: Skilled Nursing Facility | Attending: Internal Medicine | Admitting: Internal Medicine

## 2020-08-23 ENCOUNTER — Emergency Department (HOSPITAL_COMMUNITY): Payer: Medicare Other

## 2020-08-23 ENCOUNTER — Other Ambulatory Visit: Payer: Self-pay

## 2020-08-23 DIAGNOSIS — J40 Bronchitis, not specified as acute or chronic: Secondary | ICD-10-CM

## 2020-08-23 DIAGNOSIS — J440 Chronic obstructive pulmonary disease with acute lower respiratory infection: Secondary | ICD-10-CM | POA: Diagnosis present

## 2020-08-23 DIAGNOSIS — J180 Bronchopneumonia, unspecified organism: Secondary | ICD-10-CM | POA: Diagnosis not present

## 2020-08-23 DIAGNOSIS — I10 Essential (primary) hypertension: Secondary | ICD-10-CM | POA: Diagnosis not present

## 2020-08-23 DIAGNOSIS — J9622 Acute and chronic respiratory failure with hypercapnia: Secondary | ICD-10-CM | POA: Diagnosis not present

## 2020-08-23 DIAGNOSIS — R2681 Unsteadiness on feet: Secondary | ICD-10-CM | POA: Diagnosis not present

## 2020-08-23 DIAGNOSIS — E538 Deficiency of other specified B group vitamins: Secondary | ICD-10-CM | POA: Diagnosis present

## 2020-08-23 DIAGNOSIS — I482 Chronic atrial fibrillation, unspecified: Secondary | ICD-10-CM | POA: Diagnosis present

## 2020-08-23 DIAGNOSIS — Z66 Do not resuscitate: Secondary | ICD-10-CM | POA: Diagnosis present

## 2020-08-23 DIAGNOSIS — Z9049 Acquired absence of other specified parts of digestive tract: Secondary | ICD-10-CM

## 2020-08-23 DIAGNOSIS — G9341 Metabolic encephalopathy: Secondary | ICD-10-CM | POA: Diagnosis present

## 2020-08-23 DIAGNOSIS — Z87891 Personal history of nicotine dependence: Secondary | ICD-10-CM

## 2020-08-23 DIAGNOSIS — G2581 Restless legs syndrome: Secondary | ICD-10-CM | POA: Diagnosis present

## 2020-08-23 DIAGNOSIS — J9612 Chronic respiratory failure with hypercapnia: Secondary | ICD-10-CM | POA: Diagnosis present

## 2020-08-23 DIAGNOSIS — F0390 Unspecified dementia without behavioral disturbance: Secondary | ICD-10-CM | POA: Diagnosis present

## 2020-08-23 DIAGNOSIS — R0602 Shortness of breath: Secondary | ICD-10-CM

## 2020-08-23 DIAGNOSIS — Z888 Allergy status to other drugs, medicaments and biological substances status: Secondary | ICD-10-CM

## 2020-08-23 DIAGNOSIS — A080 Rotaviral enteritis: Secondary | ICD-10-CM | POA: Diagnosis not present

## 2020-08-23 DIAGNOSIS — J9691 Respiratory failure, unspecified with hypoxia: Secondary | ICD-10-CM | POA: Diagnosis not present

## 2020-08-23 DIAGNOSIS — K922 Gastrointestinal hemorrhage, unspecified: Secondary | ICD-10-CM | POA: Diagnosis not present

## 2020-08-23 DIAGNOSIS — E876 Hypokalemia: Secondary | ICD-10-CM | POA: Diagnosis present

## 2020-08-23 DIAGNOSIS — Z8719 Personal history of other diseases of the digestive system: Secondary | ICD-10-CM

## 2020-08-23 DIAGNOSIS — I959 Hypotension, unspecified: Secondary | ICD-10-CM | POA: Diagnosis present

## 2020-08-23 DIAGNOSIS — Z20822 Contact with and (suspected) exposure to covid-19: Secondary | ICD-10-CM | POA: Diagnosis present

## 2020-08-23 DIAGNOSIS — Z7901 Long term (current) use of anticoagulants: Secondary | ICD-10-CM

## 2020-08-23 DIAGNOSIS — J9600 Acute respiratory failure, unspecified whether with hypoxia or hypercapnia: Secondary | ICD-10-CM | POA: Diagnosis not present

## 2020-08-23 DIAGNOSIS — Z85118 Personal history of other malignant neoplasm of bronchus and lung: Secondary | ICD-10-CM

## 2020-08-23 DIAGNOSIS — C3491 Malignant neoplasm of unspecified part of right bronchus or lung: Secondary | ICD-10-CM | POA: Diagnosis not present

## 2020-08-23 DIAGNOSIS — I4891 Unspecified atrial fibrillation: Secondary | ICD-10-CM | POA: Diagnosis not present

## 2020-08-23 DIAGNOSIS — J9621 Acute and chronic respiratory failure with hypoxia: Secondary | ICD-10-CM | POA: Diagnosis present

## 2020-08-23 DIAGNOSIS — J9611 Chronic respiratory failure with hypoxia: Secondary | ICD-10-CM | POA: Diagnosis present

## 2020-08-23 DIAGNOSIS — J449 Chronic obstructive pulmonary disease, unspecified: Secondary | ICD-10-CM | POA: Diagnosis present

## 2020-08-23 DIAGNOSIS — Z7952 Long term (current) use of systemic steroids: Secondary | ICD-10-CM

## 2020-08-23 DIAGNOSIS — J9692 Respiratory failure, unspecified with hypercapnia: Secondary | ICD-10-CM | POA: Diagnosis not present

## 2020-08-23 DIAGNOSIS — D500 Iron deficiency anemia secondary to blood loss (chronic): Secondary | ICD-10-CM | POA: Diagnosis present

## 2020-08-23 DIAGNOSIS — J90 Pleural effusion, not elsewhere classified: Secondary | ICD-10-CM | POA: Diagnosis not present

## 2020-08-23 DIAGNOSIS — Z8249 Family history of ischemic heart disease and other diseases of the circulatory system: Secondary | ICD-10-CM

## 2020-08-23 DIAGNOSIS — D649 Anemia, unspecified: Secondary | ICD-10-CM | POA: Diagnosis not present

## 2020-08-23 DIAGNOSIS — Z79899 Other long term (current) drug therapy: Secondary | ICD-10-CM

## 2020-08-23 DIAGNOSIS — Z808 Family history of malignant neoplasm of other organs or systems: Secondary | ICD-10-CM

## 2020-08-23 DIAGNOSIS — I517 Cardiomegaly: Secondary | ICD-10-CM | POA: Diagnosis not present

## 2020-08-23 DIAGNOSIS — Z856 Personal history of leukemia: Secondary | ICD-10-CM

## 2020-08-23 DIAGNOSIS — J441 Chronic obstructive pulmonary disease with (acute) exacerbation: Secondary | ICD-10-CM | POA: Diagnosis present

## 2020-08-23 DIAGNOSIS — Z806 Family history of leukemia: Secondary | ICD-10-CM

## 2020-08-23 DIAGNOSIS — K219 Gastro-esophageal reflux disease without esophagitis: Secondary | ICD-10-CM

## 2020-08-23 DIAGNOSIS — Z9481 Bone marrow transplant status: Secondary | ICD-10-CM

## 2020-08-23 DIAGNOSIS — E874 Mixed disorder of acid-base balance: Secondary | ICD-10-CM | POA: Diagnosis present

## 2020-08-23 DIAGNOSIS — R7881 Bacteremia: Secondary | ICD-10-CM | POA: Diagnosis not present

## 2020-08-23 DIAGNOSIS — R4182 Altered mental status, unspecified: Secondary | ICD-10-CM | POA: Diagnosis not present

## 2020-08-23 DIAGNOSIS — Z800 Family history of malignant neoplasm of digestive organs: Secondary | ICD-10-CM

## 2020-08-23 DIAGNOSIS — Z885 Allergy status to narcotic agent status: Secondary | ICD-10-CM

## 2020-08-23 DIAGNOSIS — Z801 Family history of malignant neoplasm of trachea, bronchus and lung: Secondary | ICD-10-CM

## 2020-08-23 DIAGNOSIS — R262 Difficulty in walking, not elsewhere classified: Secondary | ICD-10-CM | POA: Diagnosis not present

## 2020-08-23 DIAGNOSIS — Z923 Personal history of irradiation: Secondary | ICD-10-CM

## 2020-08-23 LAB — CBC WITH DIFFERENTIAL/PLATELET
Abs Immature Granulocytes: 0.03 10*3/uL (ref 0.00–0.07)
Basophils Absolute: 0.1 10*3/uL (ref 0.0–0.1)
Basophils Relative: 1 %
Eosinophils Absolute: 0.1 10*3/uL (ref 0.0–0.5)
Eosinophils Relative: 2 %
HCT: 30.6 % — ABNORMAL LOW (ref 39.0–52.0)
Hemoglobin: 9.2 g/dL — ABNORMAL LOW (ref 13.0–17.0)
Immature Granulocytes: 1 %
Lymphocytes Relative: 18 %
Lymphs Abs: 0.8 10*3/uL (ref 0.7–4.0)
MCH: 30.5 pg (ref 26.0–34.0)
MCHC: 30.1 g/dL (ref 30.0–36.0)
MCV: 101.3 fL — ABNORMAL HIGH (ref 80.0–100.0)
Monocytes Absolute: 0.9 10*3/uL (ref 0.1–1.0)
Monocytes Relative: 20 %
Neutro Abs: 2.6 10*3/uL (ref 1.7–7.7)
Neutrophils Relative %: 58 %
Platelets: 208 10*3/uL (ref 150–400)
RBC: 3.02 MIL/uL — ABNORMAL LOW (ref 4.22–5.81)
RDW: 20.6 % — ABNORMAL HIGH (ref 11.5–15.5)
WBC: 4.5 10*3/uL (ref 4.0–10.5)
nRBC: 0 % (ref 0.0–0.2)

## 2020-08-23 LAB — I-STAT ARTERIAL BLOOD GAS, ED
Acid-Base Excess: 22 mmol/L — ABNORMAL HIGH (ref 0.0–2.0)
Bicarbonate: 50.2 mmol/L — ABNORMAL HIGH (ref 20.0–28.0)
Calcium, Ion: 1.17 mmol/L (ref 1.15–1.40)
HCT: 29 % — ABNORMAL LOW (ref 39.0–52.0)
Hemoglobin: 9.9 g/dL — ABNORMAL LOW (ref 13.0–17.0)
O2 Saturation: 94 %
Potassium: 3.3 mmol/L — ABNORMAL LOW (ref 3.5–5.1)
Sodium: 141 mmol/L (ref 135–145)
TCO2: >50 mmol/L — ABNORMAL HIGH (ref 22–32)
pCO2 arterial: 81.3 mmHg (ref 32.0–48.0)
pH, Arterial: 7.399 (ref 7.350–7.450)
pO2, Arterial: 78 mmHg — ABNORMAL LOW (ref 83.0–108.0)

## 2020-08-23 LAB — I-STAT BETA HCG BLOOD, ED (MC, WL, AP ONLY): I-stat hCG, quantitative: 158.2 m[IU]/mL — ABNORMAL HIGH (ref <5)

## 2020-08-23 LAB — BASIC METABOLIC PANEL
Anion gap: 10 (ref 5–15)
BUN: 22 mg/dL (ref 8–23)
CO2: 39 mmol/L — ABNORMAL HIGH (ref 22–32)
Calcium: 8.6 mg/dL — ABNORMAL LOW (ref 8.9–10.3)
Chloride: 88 mmol/L — ABNORMAL LOW (ref 98–111)
Creatinine, Ser: 1.1 mg/dL (ref 0.61–1.24)
GFR, Estimated: >60 mL/min (ref >60)
Glucose, Bld: 80 mg/dL (ref 70–99)
Potassium: 4.6 mmol/L (ref 3.5–5.1)
Sodium: 137 mmol/L (ref 135–145)

## 2020-08-23 LAB — RESP PANEL BY RT-PCR (FLU A&B, COVID) ARPGX2
Influenza A by PCR: NEGATIVE
Influenza B by PCR: NEGATIVE
SARS Coronavirus 2 by RT PCR: NEGATIVE

## 2020-08-23 LAB — HCG, QUANTITATIVE, PREGNANCY: hCG, Beta Chain, Quant, S: 1 m[IU]/mL (ref <5)

## 2020-08-23 MED ORDER — SODIUM CHLORIDE 0.9 % IV SOLN
1.0000 g | Freq: Once | INTRAVENOUS | Status: AC
  Administered 2020-08-24: 1 g via INTRAVENOUS
  Filled 2020-08-23: qty 10

## 2020-08-23 MED ORDER — SODIUM CHLORIDE 0.9 % IV SOLN
500.0000 mg | Freq: Once | INTRAVENOUS | Status: AC
  Administered 2020-08-24: 500 mg via INTRAVENOUS
  Filled 2020-08-23: qty 500

## 2020-08-23 NOTE — ED Triage Notes (Signed)
Pt arrived to ED via EMS from [place] as a respiratory distress and AMS. EMS reports pt was recently discharged from the SDU 5 days ago after being here for 5 weeks d/t elevated CO2. Facility called EMS out for hypoxia. Pt is on 2.5 L O2 at baseline. EMS placed pt on 5L and pt was sating 90%. EMS reports en route pt became lethargic, droped his head and desated to 60's. EMS then placed pt on NRB at 15L and pt came up to 90-92%. VS: 118/70, HR 75, RR 24, ETCO2 41, O2 92%.
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
## 2020-08-24 ENCOUNTER — Observation Stay (HOSPITAL_COMMUNITY): Payer: Medicare Other

## 2020-08-24 ENCOUNTER — Inpatient Hospital Stay (HOSPITAL_COMMUNITY): Payer: Medicare Other

## 2020-08-24 DIAGNOSIS — R531 Weakness: Secondary | ICD-10-CM | POA: Diagnosis not present

## 2020-08-24 DIAGNOSIS — G9340 Encephalopathy, unspecified: Secondary | ICD-10-CM | POA: Diagnosis not present

## 2020-08-24 DIAGNOSIS — Z743 Need for continuous supervision: Secondary | ICD-10-CM | POA: Diagnosis not present

## 2020-08-24 DIAGNOSIS — J440 Chronic obstructive pulmonary disease with acute lower respiratory infection: Secondary | ICD-10-CM | POA: Diagnosis present

## 2020-08-24 DIAGNOSIS — R498 Other voice and resonance disorders: Secondary | ICD-10-CM | POA: Diagnosis not present

## 2020-08-24 DIAGNOSIS — K219 Gastro-esophageal reflux disease without esophagitis: Secondary | ICD-10-CM | POA: Diagnosis present

## 2020-08-24 DIAGNOSIS — F0390 Unspecified dementia without behavioral disturbance: Secondary | ICD-10-CM | POA: Diagnosis present

## 2020-08-24 DIAGNOSIS — R0602 Shortness of breath: Secondary | ICD-10-CM | POA: Diagnosis not present

## 2020-08-24 DIAGNOSIS — D500 Iron deficiency anemia secondary to blood loss (chronic): Secondary | ICD-10-CM | POA: Diagnosis present

## 2020-08-24 DIAGNOSIS — J40 Bronchitis, not specified as acute or chronic: Secondary | ICD-10-CM

## 2020-08-24 DIAGNOSIS — Z885 Allergy status to narcotic agent status: Secondary | ICD-10-CM | POA: Diagnosis not present

## 2020-08-24 DIAGNOSIS — J180 Bronchopneumonia, unspecified organism: Secondary | ICD-10-CM | POA: Diagnosis present

## 2020-08-24 DIAGNOSIS — Z9481 Bone marrow transplant status: Secondary | ICD-10-CM | POA: Diagnosis not present

## 2020-08-24 DIAGNOSIS — M6281 Muscle weakness (generalized): Secondary | ICD-10-CM | POA: Diagnosis not present

## 2020-08-24 DIAGNOSIS — E876 Hypokalemia: Secondary | ICD-10-CM | POA: Diagnosis present

## 2020-08-24 DIAGNOSIS — Z7189 Other specified counseling: Secondary | ICD-10-CM | POA: Diagnosis not present

## 2020-08-24 DIAGNOSIS — Z923 Personal history of irradiation: Secondary | ICD-10-CM | POA: Diagnosis not present

## 2020-08-24 DIAGNOSIS — J9622 Acute and chronic respiratory failure with hypercapnia: Secondary | ICD-10-CM

## 2020-08-24 DIAGNOSIS — R2689 Other abnormalities of gait and mobility: Secondary | ICD-10-CM | POA: Diagnosis not present

## 2020-08-24 DIAGNOSIS — I482 Chronic atrial fibrillation, unspecified: Secondary | ICD-10-CM

## 2020-08-24 DIAGNOSIS — E538 Deficiency of other specified B group vitamins: Secondary | ICD-10-CM | POA: Diagnosis present

## 2020-08-24 DIAGNOSIS — Z7901 Long term (current) use of anticoagulants: Secondary | ICD-10-CM | POA: Diagnosis not present

## 2020-08-24 DIAGNOSIS — J189 Pneumonia, unspecified organism: Secondary | ICD-10-CM | POA: Diagnosis not present

## 2020-08-24 DIAGNOSIS — R4182 Altered mental status, unspecified: Secondary | ICD-10-CM | POA: Diagnosis not present

## 2020-08-24 DIAGNOSIS — Z66 Do not resuscitate: Secondary | ICD-10-CM | POA: Diagnosis present

## 2020-08-24 DIAGNOSIS — J9621 Acute and chronic respiratory failure with hypoxia: Secondary | ICD-10-CM

## 2020-08-24 DIAGNOSIS — R279 Unspecified lack of coordination: Secondary | ICD-10-CM | POA: Diagnosis not present

## 2020-08-24 DIAGNOSIS — Z515 Encounter for palliative care: Secondary | ICD-10-CM | POA: Diagnosis not present

## 2020-08-24 DIAGNOSIS — Z85118 Personal history of other malignant neoplasm of bronchus and lung: Secondary | ICD-10-CM | POA: Diagnosis not present

## 2020-08-24 DIAGNOSIS — J449 Chronic obstructive pulmonary disease, unspecified: Secondary | ICD-10-CM

## 2020-08-24 DIAGNOSIS — G9341 Metabolic encephalopathy: Secondary | ICD-10-CM | POA: Diagnosis present

## 2020-08-24 DIAGNOSIS — Z856 Personal history of leukemia: Secondary | ICD-10-CM | POA: Diagnosis not present

## 2020-08-24 DIAGNOSIS — Z20822 Contact with and (suspected) exposure to covid-19: Secondary | ICD-10-CM | POA: Diagnosis present

## 2020-08-24 DIAGNOSIS — G319 Degenerative disease of nervous system, unspecified: Secondary | ICD-10-CM | POA: Diagnosis not present

## 2020-08-24 DIAGNOSIS — R410 Disorientation, unspecified: Secondary | ICD-10-CM | POA: Diagnosis not present

## 2020-08-24 DIAGNOSIS — R41841 Cognitive communication deficit: Secondary | ICD-10-CM | POA: Diagnosis not present

## 2020-08-24 DIAGNOSIS — R1312 Dysphagia, oropharyngeal phase: Secondary | ICD-10-CM | POA: Diagnosis not present

## 2020-08-24 DIAGNOSIS — I6782 Cerebral ischemia: Secondary | ICD-10-CM | POA: Diagnosis not present

## 2020-08-24 DIAGNOSIS — R2681 Unsteadiness on feet: Secondary | ICD-10-CM | POA: Diagnosis not present

## 2020-08-24 DIAGNOSIS — G2581 Restless legs syndrome: Secondary | ICD-10-CM | POA: Diagnosis present

## 2020-08-24 DIAGNOSIS — I959 Hypotension, unspecified: Secondary | ICD-10-CM | POA: Diagnosis present

## 2020-08-24 DIAGNOSIS — E874 Mixed disorder of acid-base balance: Secondary | ICD-10-CM | POA: Diagnosis present

## 2020-08-24 DIAGNOSIS — J9611 Chronic respiratory failure with hypoxia: Secondary | ICD-10-CM | POA: Diagnosis present

## 2020-08-24 DIAGNOSIS — J441 Chronic obstructive pulmonary disease with (acute) exacerbation: Secondary | ICD-10-CM | POA: Diagnosis present

## 2020-08-24 DIAGNOSIS — J9601 Acute respiratory failure with hypoxia: Secondary | ICD-10-CM | POA: Diagnosis not present

## 2020-08-24 DIAGNOSIS — J9602 Acute respiratory failure with hypercapnia: Secondary | ICD-10-CM | POA: Diagnosis not present

## 2020-08-24 DIAGNOSIS — J9600 Acute respiratory failure, unspecified whether with hypoxia or hypercapnia: Secondary | ICD-10-CM | POA: Diagnosis not present

## 2020-08-24 LAB — COMPREHENSIVE METABOLIC PANEL
ALT: 17 U/L (ref 0–44)
AST: 22 U/L (ref 15–41)
Albumin: 2.9 g/dL — ABNORMAL LOW (ref 3.5–5.0)
Alkaline Phosphatase: 88 U/L (ref 38–126)
Anion gap: 5 (ref 5–15)
BUN: 17 mg/dL (ref 8–23)
CO2: 49 mmol/L — ABNORMAL HIGH (ref 22–32)
Calcium: 8.7 mg/dL — ABNORMAL LOW (ref 8.9–10.3)
Chloride: 91 mmol/L — ABNORMAL LOW (ref 98–111)
Creatinine, Ser: 1.06 mg/dL (ref 0.61–1.24)
GFR, Estimated: >60 mL/min (ref >60)
Glucose, Bld: 89 mg/dL (ref 70–99)
Potassium: 3.5 mmol/L (ref 3.5–5.1)
Sodium: 145 mmol/L (ref 135–145)
Total Bilirubin: 0.5 mg/dL (ref 0.3–1.2)
Total Protein: 5.7 g/dL — ABNORMAL LOW (ref 6.5–8.1)

## 2020-08-24 LAB — CBC
HCT: 28.5 % — ABNORMAL LOW (ref 39.0–52.0)
Hemoglobin: 8.5 g/dL — ABNORMAL LOW (ref 13.0–17.0)
MCH: 29.8 pg (ref 26.0–34.0)
MCHC: 29.8 g/dL — ABNORMAL LOW (ref 30.0–36.0)
MCV: 100 fL (ref 80.0–100.0)
Platelets: 180 10*3/uL (ref 150–400)
RBC: 2.85 MIL/uL — ABNORMAL LOW (ref 4.22–5.81)
RDW: 20.8 % — ABNORMAL HIGH (ref 11.5–15.5)
WBC: 4 10*3/uL (ref 4.0–10.5)
nRBC: 0 % (ref 0.0–0.2)

## 2020-08-24 LAB — I-STAT ARTERIAL BLOOD GAS, ED
Acid-Base Excess: 23 mmol/L — ABNORMAL HIGH (ref 0.0–2.0)
Bicarbonate: 51 mmol/L — ABNORMAL HIGH (ref 20.0–28.0)
Calcium, Ion: 1.19 mmol/L (ref 1.15–1.40)
HCT: 28 % — ABNORMAL LOW (ref 39.0–52.0)
Hemoglobin: 9.5 g/dL — ABNORMAL LOW (ref 13.0–17.0)
O2 Saturation: 90 %
Patient temperature: 98
Potassium: 3.3 mmol/L — ABNORMAL LOW (ref 3.5–5.1)
Sodium: 142 mmol/L (ref 135–145)
TCO2: >50 mmol/L — ABNORMAL HIGH (ref 22–32)
pCO2 arterial: 78.7 mmHg (ref 32.0–48.0)
pH, Arterial: 7.419 (ref 7.350–7.450)
pO2, Arterial: 60 mmHg — ABNORMAL LOW (ref 83.0–108.0)

## 2020-08-24 LAB — BRAIN NATRIURETIC PEPTIDE: B Natriuretic Peptide: 193.1 pg/mL — ABNORMAL HIGH (ref 0.0–100.0)

## 2020-08-24 LAB — C-REACTIVE PROTEIN: CRP: 1.3 mg/dL — ABNORMAL HIGH (ref <1.0)

## 2020-08-24 LAB — VITAMIN B12: Vitamin B-12: 475 pg/mL (ref 180–914)

## 2020-08-24 LAB — PROCALCITONIN: Procalcitonin: 0.1 ng/mL

## 2020-08-24 LAB — MAGNESIUM: Magnesium: 1.3 mg/dL — ABNORMAL LOW (ref 1.7–2.4)

## 2020-08-24 LAB — D-DIMER, QUANTITATIVE: D-Dimer, Quant: 1.15 ug/mL-FEU — ABNORMAL HIGH (ref 0.00–0.50)

## 2020-08-24 LAB — STREP PNEUMONIAE URINARY ANTIGEN: Strep Pneumo Urinary Antigen: NEGATIVE

## 2020-08-24 LAB — MRSA PCR SCREENING: MRSA by PCR: NEGATIVE

## 2020-08-24 MED ORDER — APIXABAN 5 MG PO TABS
5.0000 mg | ORAL_TABLET | Freq: Two times a day (BID) | ORAL | Status: DC
  Administered 2020-08-24 – 2020-08-27 (×7): 5 mg via ORAL
  Filled 2020-08-24 (×7): qty 1

## 2020-08-24 MED ORDER — METHYLPREDNISOLONE SODIUM SUCC 125 MG IJ SOLR
60.0000 mg | Freq: Two times a day (BID) | INTRAMUSCULAR | Status: DC
  Administered 2020-08-24 – 2020-08-25 (×4): 60 mg via INTRAVENOUS
  Filled 2020-08-24 (×3): qty 2

## 2020-08-24 MED ORDER — IPRATROPIUM-ALBUTEROL 0.5-2.5 (3) MG/3ML IN SOLN
3.0000 mL | Freq: Three times a day (TID) | RESPIRATORY_TRACT | Status: DC
  Administered 2020-08-25: 3 mL via RESPIRATORY_TRACT
  Filled 2020-08-24: qty 3

## 2020-08-24 MED ORDER — DONEPEZIL HCL 10 MG PO TABS
10.0000 mg | ORAL_TABLET | Freq: Every day | ORAL | Status: DC
  Administered 2020-08-24 – 2020-08-26 (×3): 10 mg via ORAL
  Filled 2020-08-24 (×3): qty 1

## 2020-08-24 MED ORDER — MAGNESIUM SULFATE 2 GM/50ML IV SOLN
2.0000 g | Freq: Once | INTRAVENOUS | Status: AC
  Administered 2020-08-25: 2 g via INTRAVENOUS
  Filled 2020-08-24: qty 50

## 2020-08-24 MED ORDER — LACTATED RINGERS IV SOLN: INTRAVENOUS | Status: DC

## 2020-08-24 MED ORDER — MAGNESIUM SULFATE 2 GM/50ML IV SOLN
2.0000 g | Freq: Once | INTRAVENOUS | Status: AC
  Administered 2020-08-24: 2 g via INTRAVENOUS
  Filled 2020-08-24: qty 50

## 2020-08-24 MED ORDER — POTASSIUM CHLORIDE 10 MEQ/100ML IV SOLN
10.0000 meq | INTRAVENOUS | Status: AC
  Administered 2020-08-24 (×3): 10 meq via INTRAVENOUS
  Filled 2020-08-24 (×4): qty 100

## 2020-08-24 MED ORDER — MEMANTINE HCL 10 MG PO TABS
5.0000 mg | ORAL_TABLET | Freq: Two times a day (BID) | ORAL | Status: DC
  Administered 2020-08-24 – 2020-08-27 (×7): 5 mg via ORAL
  Filled 2020-08-24 (×7): qty 1

## 2020-08-24 MED ORDER — SODIUM CHLORIDE 0.9 % IV SOLN: 1.0000 g | INTRAVENOUS | Status: DC

## 2020-08-24 MED ORDER — SODIUM CHLORIDE 0.9 % IV SOLN: 500.0000 mg | INTRAVENOUS | Status: DC

## 2020-08-24 MED ORDER — IPRATROPIUM-ALBUTEROL 0.5-2.5 (3) MG/3ML IN SOLN
3.0000 mL | RESPIRATORY_TRACT | Status: DC
  Administered 2020-08-24 (×2): 3 mL via RESPIRATORY_TRACT
  Filled 2020-08-24 (×2): qty 3

## 2020-08-24 MED ORDER — ACETAMINOPHEN 325 MG PO TABS
650.0000 mg | ORAL_TABLET | Freq: Four times a day (QID) | ORAL | Status: DC | PRN
  Administered 2020-08-25 – 2020-08-26 (×3): 650 mg via ORAL
  Filled 2020-08-24 (×3): qty 2

## 2020-08-24 MED ORDER — AMOXICILLIN-POT CLAVULANATE 875-125 MG PO TABS
1.0000 | ORAL_TABLET | Freq: Two times a day (BID) | ORAL | Status: DC
  Administered 2020-08-24 – 2020-08-27 (×7): 1 via ORAL
  Filled 2020-08-24 (×7): qty 1

## 2020-08-24 MED ORDER — PANTOPRAZOLE SODIUM 40 MG PO TBEC
40.0000 mg | DELAYED_RELEASE_TABLET | Freq: Every day | ORAL | Status: DC
  Administered 2020-08-24 – 2020-08-26 (×3): 40 mg via ORAL
  Filled 2020-08-24 (×3): qty 1

## 2020-08-24 MED ORDER — DIGOXIN 125 MCG PO TABS
0.2500 mg | ORAL_TABLET | Freq: Every morning | ORAL | Status: DC
  Administered 2020-08-24 – 2020-08-27 (×4): 0.25 mg via ORAL
  Filled 2020-08-24 (×4): qty 2

## 2020-08-24 NOTE — Progress Notes (Signed)
SLP Cancellation Note  Patient Details Name: Todd Lloyd MRN: [id number] DOB: March 30, 1941   Cancelled treatment:       Reason Eval/Treat Not Completed: Patient at procedure or test/unavailable. Pt currently off unit for testing. Will continue efforts to complete BSE. Pt is NPO and on HFNC at this time.   Malone Admire B. Quentin Ore, [facility], [place] Speech Language Pathologist Office: [phone]  Shonna Chock 08/24/2020, 11:00 AM

## 2020-08-24 NOTE — Progress Notes (Signed)
Received male pt from ER via stretcher alert not in distress with BiPAP mask  With RT and nurse around, transferred to bed 23, activities well tolerated, morning bath done, connected to cardiac monitor tele dept informed about admission, skin integrity assessed and verified by by RN Katrine Coho, orient pt to room and use of call bell, set bed alarm

## 2020-08-24 NOTE — Plan of Care (Signed)
  Problem: Education: Goal: Knowledge of General Education information will improve Description: Including pain rating scale, medication(s)/side effects and non-pharmacologic comfort measures 08/24/2020 0431 by Lorna Dibble, RN Outcome: Progressing 08/24/2020 0431 by Lorna Dibble, RN Outcome: Progressing   Problem: Health Behavior/Discharge Planning: Goal: Ability to manage health-related needs will improve 08/24/2020 0431 by Lorna Dibble, RN Outcome: Progressing 08/24/2020 0431 by Lorna Dibble, RN Outcome: Progressing   Problem: Clinical Measurements: Goal: Ability to maintain clinical measurements within normal limits will improve 08/24/2020 0431 by Lorna Dibble, RN Outcome: Progressing 08/24/2020 0431 by Lorna Dibble, RN Outcome: Progressing Goal: Will remain free from infection 08/24/2020 0431 by Lorna Dibble, RN Outcome: Progressing 08/24/2020 0431 by Lorna Dibble, RN Outcome: Progressing Goal: Diagnostic test results will improve 08/24/2020 0431 by Lorna Dibble, RN Outcome: Progressing 08/24/2020 0431 by Lorna Dibble, RN Outcome: Progressing Goal: Respiratory complications will improve 08/24/2020 0431 by Lorna Dibble, RN Outcome: Progressing 08/24/2020 0431 by Lorna Dibble, RN Outcome: Progressing Goal: Cardiovascular complication will be avoided 08/24/2020 0431 by Lorna Dibble, RN Outcome: Progressing 08/24/2020 0431 by Lorna Dibble, RN Outcome: Progressing   Problem: Activity: Goal: Risk for activity intolerance will decrease 08/24/2020 0431 by Lorna Dibble, RN Outcome: Progressing 08/24/2020 0431 by Lorna Dibble, RN Outcome: Progressing   Problem: Nutrition: Goal: Adequate nutrition will be maintained 08/24/2020 0431 by Lorna Dibble, RN Outcome: Progressing 08/24/2020 0431 by Lorna Dibble, RN Outcome: Progressing   Problem: Coping: Goal: Level  of anxiety will decrease 08/24/2020 0431 by Lorna Dibble, RN Outcome: Progressing 08/24/2020 0431 by Lorna Dibble, RN Outcome: Progressing   Problem: Elimination: Goal: Will not experience complications related to bowel motility 08/24/2020 0431 by Lorna Dibble, RN Outcome: Progressing 08/24/2020 0431 by Lorna Dibble, RN Outcome: Progressing Goal: Will not experience complications related to urinary retention 08/24/2020 0431 by Lorna Dibble, RN Outcome: Progressing 08/24/2020 0431 by Lorna Dibble, RN Outcome: Progressing   Problem: Pain Managment: Goal: General experience of comfort will improve 08/24/2020 0431 by Lorna Dibble, RN Outcome: Progressing 08/24/2020 0431 by Lorna Dibble, RN Outcome: Progressing   Problem: Safety: Goal: Ability to remain free from injury will improve 08/24/2020 0431 by Lorna Dibble, RN Outcome: Progressing 08/24/2020 0431 by Lorna Dibble, RN Outcome: Progressing   Problem: Skin Integrity: Goal: Risk for impaired skin integrity will decrease 08/24/2020 0431 by Lorna Dibble, RN Outcome: Progressing 08/24/2020 0431 by Lorna Dibble, RN Outcome: Progressing
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
## 2020-08-24 NOTE — Evaluation (Signed)
Physical Therapy Evaluation Patient Details Name: Todd Lloyd MRN: [id number] DOB: Sep 04, 1940 Today's Date: 08/24/2020   History of Present Illness  80 y.o. male presents to ED 08/23/20 when EMS was activated at SNF due to patient having breathing difficulty.  Patient was reported to transiently desatted to 61 on 5 LPM via . Recent long hospitalization due to acute on chronic blood loss anemia, septic shock due to rotavirus and enteropathogenic E. coli diarrhea, while hospitalized AKI due to hypovolemia and acute hypoxic and hypercarbic respiratory failure due COPD exacerbation discharged on 4/1. In ED, pt showed macrocytic anemia, and BMP showing hypokalemia and elevated CO2. Pt placed on biPAP and O2 sat 96-100% Chest x-ray showed appearance of chest concerning for bronchitis with developing multilobar bronchopneumonia and trace right pleural effusion and mild cardiomegaly.  Admitted for observation of Acute on chronic respiratory failure with hypoxia and hypercapnia PMH: COPD, stage I non-small cell lung cancer s/p radiation in 04/2020, dementia, paroxysmal A. fib on Eliquis, GERD, leukemia s/p bone marrow transplantation in 1990.    Clinical Impression  Pt disoriented to time and situation, able to state he is at [facility] but confused by how he got to room on 5W. Pt able to report that he was at [facility] and there he was getting around with RW under supervision and was mobilizing with w/c independently. Pt required assist for bathing and LE dressing. Pt is currently limited in safe mobility by decreased strength and endurance. Pt is min A for bed mobility, transfers and ambulation in room. PT recommending return to SNF to continue rehab before returning home. PT will continue to follow acutely.     Follow Up Recommendations SNF    Equipment Recommendations  Rolling walker with 5" wheels    Recommendations for Other Services OT consult     Precautions / Restrictions  Precautions Precautions: Fall Precaution Comments: watch sats and HR Restrictions Weight Bearing Restrictions: No      Mobility  Bed Mobility Overal bed mobility: Needs Assistance Bed Mobility: Supine to Sit Rolling: Min assist   Supine to sit: Min guard;HOB elevated Sit to supine: Supervision   General bed mobility comments: min A for bringing trunk to upright after pt managed LE off bed vc for use of bedrail to assist ingetting to EoB,min guard for safety, and line management with return to bed    Transfers Overall transfer level: Needs assistance   Transfers: Sit to/from Stand Sit to Stand: Min assist         General transfer comment: good power up, min A for steadying due to bilateral knee buckling  Ambulation/Gait Ambulation/Gait assistance: Min assist Gait Distance (Feet): 20 Feet Assistive device: Rolling walker (2 wheeled) Gait Pattern/deviations: Step-through pattern;Decreased step length - right;Decreased step length - left;Shuffle Gait velocity: slowed Gait velocity interpretation: <1.31 ft/sec, indicative of household ambulator General Gait Details: min A for steadying and management of lines and leads        Balance Overall balance assessment: Needs assistance Sitting-balance support: Feet supported;No upper extremity supported Sitting balance-Leahy Scale: Fair     Standing balance support: Bilateral upper extremity supported Standing balance-Leahy Scale: Poor                               Pertinent Vitals/Pain Pain Assessment: No/denies pain Pain Score: 8  Pain Location: bottom Pain Descriptors / Indicators: Sore Pain Intervention(s): Limited activity within patient's tolerance;Monitored during session;Repositioned;Patient requesting pain  meds-RN notified    Home Living Family/patient expects to be discharged to:: Skilled nursing facility                      Prior Function Level of Independence: Needs assistance    Gait / Transfers Assistance Needed: ambulating with RW and supervision or using w/c independently  ADL's / Homemaking Assistance Needed: requires assist for lower body dressing and grooming and for iADLs        Hand Dominance   Dominant Hand: Right    Extremity/Trunk Assessment   Upper Extremity Assessment Upper Extremity Assessment: Generalized weakness    Lower Extremity Assessment Lower Extremity Assessment: Generalized weakness    Cervical / Trunk Assessment Cervical / Trunk Assessment: Kyphotic  Communication   Communication: No difficulties  Cognition Arousal/Alertness: Awake/alert Behavior During Therapy: WFL for tasks assessed/performed Overall Cognitive Status: No family/caregiver present to determine baseline cognitive functioning Area of Impairment: Orientation;Memory;Following commands;Problem solving                 Orientation Level: Disoriented to;Place;Time;Situation   Memory: Decreased short-term memory;Decreased recall of precautions Following Commands: Follows multi-step commands with increased time;Follows one step commands consistently;Follows multi-step commands consistently     Problem Solving: Slow processing;Requires verbal cues;Requires tactile cues General Comments: knows that he is in [facility] but thinks he is still in ED and not exactly sure why he is here but remembers having to wear biPAP mask overnight      General Comments General comments (skin integrity, edema, etc.): pt phone and call bell not within reach and pt concerned about not being able to talk to his daughter, assisted pt to call daughter during session, Pt on 9L HFNC, with SaO2 100%O2, able to ambulate on 3L O2 with SaO2 >92%O2        Assessment/Plan    PT Assessment Patient needs continued PT services  PT Problem List Decreased strength;Decreased activity tolerance;Decreased balance;Decreased mobility;Cardiopulmonary status limiting activity       PT  Treatment Interventions DME instruction;Gait training;Functional mobility training;Therapeutic activities;Therapeutic exercise;Balance training;Cognitive remediation;Patient/family education    PT Goals (Current goals can be found in the Care Plan section)  Acute Rehab PT Goals Patient Stated Goal: call daughter PT Goal Formulation: With patient Time For Goal Achievement: 09/07/20 Potential to Achieve Goals: Fair    Frequency Min 2X/week    AM-PAC PT "6 Clicks" Mobility  Outcome Measure Help needed turning from your back to your side while in a flat bed without using bedrails?: A Little Help needed moving from lying on your back to sitting on the side of a flat bed without using bedrails?: A Little Help needed moving to and from a bed to a chair (including a wheelchair)?: A Little Help needed standing up from a chair using your arms (e.g., wheelchair or bedside chair)?: A Little Help needed to walk in hospital room?: A Little Help needed climbing 3-5 steps with a railing? : A Lot 6 Click Score: 17    End of Session Equipment Utilized During Treatment: Oxygen;Gait belt Activity Tolerance: Patient tolerated treatment well;Other (comment) Patient left: in bed;Other (comment) (transporter present to take to CT) Nurse Communication: Mobility status PT Visit Diagnosis: Unsteadiness on feet (R26.81);Other abnormalities of gait and mobility (R26.89);Muscle weakness (generalized) (M62.81);Difficulty in walking, not elsewhere classified (R26.2)    Time: 1010-1045 PT Time Calculation (min) (ACUTE ONLY): 35 min   Charges:   PT Evaluation $PT Eval Moderate Complexity: 1 Mod PT Treatments $Therapeutic  Activity: 8-22 mins        Latrecia Capito B. Migdalia Dk PT, DPT Acute Rehabilitation Services Pager [phone] Office [phone]   [place] 08/24/2020, 1:22 PM

## 2020-08-24 NOTE — Progress Notes (Signed)
PROGRESS NOTE                                                                                                                                                                                                             Patient Demographics:    Todd Lloyd, is a 80 y.o. male, DOB - 07-28-1940, KFM:[id number]  Outpatient Primary MD for the patient is [name], [name], MD    LOS - 0  Admit date - 08/23/2020    Chief Complaint  Patient presents with  . Respiratory Distress  . Altered Mental Status       Brief Narrative (HPI from H&P)   Todd Lloyd is a 80 y.o. male with medical history significant for COPD, stage I non-small cell lung cancer s/p radiation in 04/2020, dementia, paroxysmal A. fib on Eliquis, GERD, leukemia s/p bone marrow transplantation in 1990 presents to the emergency department EMS, on hospital arrival he was hypoxic, hypercapnic was placed on BiPAP and admitted, note he had similar issues of becoming obtunded with high Co2 levels on recent hospitalizations but with relatively normal pH.   Subjective:    [name] today has, No headache, No chest pain, No abdominal pain - No Nausea, No new weakness tingling or numbness, improved SOB.   Assessment  & Plan :     1. Toxic encephalopathy caused by high CO2 levels and possibly aspiration pneumonia, recurrent problem surprisingly pH usually is high normal - I think he has combination of contraction alkalosis causing him to retain more CO2 and affect his mentation, previously in this admission clinically he has responded well to BiPAP.  He refused to use BiPAP last admission during the last few days of his hospitalization, he has been seen by pulmonary upon my request and they agreed that patient might benefit from therapeutic trial of BiPAP and improvement of his contraction alkalosis and volume status.  He will get BiPAP nightly and has been  counseled to use it, if he does not use it the only other option would be comfort measures.  It was explained by pulmonary team to the patient and his daughter.    Encouraged the patient to sit up in chair in the daytime use I-S and flutter valve for pulmonary toiletry.  Will advance activity and titrate down oxygen as possible.    SpO2:  99 % O2 Flow Rate (L/min): 9 L/min FiO2 (%): 50 %  Recent Labs  Lab 08/18/20 0321 08/19/20 0240 08/19/20 1231 08/20/20 0356 08/23/20 1834 08/23/20 1855 08/23/20 2057 08/24/20 0020 08/24/20 0945  WBC 4.1 4.4  --  4.5 4.5  --   --   --  4.0  HGB 8.7* 8.3*  --  8.7* 9.2* 9.9*  --  9.5* 8.5*  HCT 28.3* 27.9*  --  28.9* 30.6* 29.0*  --  28.0* 28.5*  PLT 197 197  --  198 208  --   --   --  180  CRP  --   --   --   --   --   --   --   --  1.3*  BNP  --   --   --   --   --   --   --   --  193.1*  DDIMER  --   --   --   --   --   --   --   --  1.15*  PROCALCITON  --   --   --   --   --   --   --   --  0.10  AST  --   --   --   --   --   --   --   --  22  ALT  --   --   --   --   --   --   --   --  17  ALKPHOS  --   --   --   --   --   --   --   --  88  BILITOT  --   --   --   --   --   --   --   --  0.5  ALBUMIN  --   --   --   --   --   --   --   --  2.9*  SARSCOV2NAA  --   --  NEGATIVE  --   --   --  NEGATIVE  --   --     FiO2 (%):  [30 %-50 %] 50 %  ABG     Component Value Date/Time   PHART 7.419 08/24/2020 0020   PCO2ART 78.7 (HH) 08/24/2020 0020   PO2ART 60 (L) 08/24/2020 0020   HCO3 51.0 (H) 08/24/2020 0020   TCO2 >50 (H) 08/24/2020 0020   ACIDBASEDEF 3.0 (H) 07/27/2020 0950   O2SAT 90.0 08/24/2020 0020      2. ? Asp. PNA - Augmentin, SLP.  3.  History of dementia at risk for delirium.  Home Aricept and memantine continued.  At risk for delirium.  4.  Chronic A. fib [place] vascular score of greater than 3.  Continue Digoxin and Eliquis combination.  5.  Contraction alkalosis with hypotension.  Gentle hydration with IV fluids.   Hold beta-blocker.  6.  Hypomagnesemia.  Replace.  7.  Recent history of GI bleed likely due to cecal ischemic insult.  On Eliquis monitor closely.  Has been seen by [facility] recently.  8.  GERD.  On PPI.      Condition - Extremely Guarded  Family Communication  :  Daughter 08/24/20   Code Status : DNR  Consults  :  PCCM, [place]  PUD Prophylaxis : PPI   Procedures  :     CT Head - non acute  Disposition Plan  :    Status is: Observation  Dispo: The patient is from: Home              Anticipated d/c is to: Home              Patient currently is not medically stable to d/c.   Difficult to place patient No   DVT Prophylaxis  : Eliquis  Lab Results  Component Value Date   PLT 180 08/24/2020    Diet :  Diet Order            DIET SOFT Room service appropriate? Yes; Fluid consistency: Thin  Diet effective now                  Inpatient Medications  Scheduled Meds: . apixaban  5 mg Oral BID  . digoxin  0.25 mg Oral q morning  . donepezil  10 mg Oral QHS  . memantine  5 mg Oral BID  . methylPREDNISolone (SOLU-MEDROL) injection  60 mg Intravenous Q12H  . pantoprazole  40 mg Oral Q0600   Continuous Infusions: . [START ON 08/25/2020] azithromycin    . cefTRIAXone (ROCEPHIN)  IV    . potassium chloride     PRN Meds:.  Antibiotics  :    Anti-infectives (From admission, onward)   Start     Dose/Rate Route Frequency Ordered Stop   08/25/20 0000  azithromycin (ZITHROMAX) 500 mg in sodium chloride 0.9 % 250 mL IVPB        500 mg 250 mL/hr over 60 Minutes Intravenous Every 24 hours 08/24/20 0643     08/24/20 2200  cefTRIAXone (ROCEPHIN) 1 g in sodium chloride 0.9 % 100 mL IVPB        1 g 200 mL/hr over 30 Minutes Intravenous Every 24 hours 08/24/20 0643     08/24/20 0000  cefTRIAXone (ROCEPHIN) 1 g in sodium chloride 0.9 % 100 mL IVPB        1 g 200 mL/hr over 30 Minutes Intravenous  Once 08/23/20 2349 08/24/20 0159   08/24/20 0000  azithromycin  (ZITHROMAX) 500 mg in sodium chloride 0.9 % 250 mL IVPB        500 mg 250 mL/hr over 60 Minutes Intravenous  Once 08/23/20 2349 08/24/20 0400       Time Spent in minutes  30   Lala Lund M.D on 08/24/2020 at 12:08 PM  To page go to [email or web address]   [facility] -  Office  [phone]     See all Orders from today for further details    Objective:   Vitals:   08/24/20 0330 08/24/20 0600 08/24/20 0805 08/24/20 0849  BP:  (!) 92/92  (!) 103/57  Pulse: 82 87  80  Resp: 18 16  (!) 21  Temp:    98.1 F (36.7 C)  TempSrc:    Rectal  SpO2: 96% 91% 95% 99%  Weight: 84.5 kg     Height:        Wt Readings from Last 3 Encounters:  08/24/20 84.5 kg  08/20/20 87.8 kg  04/22/20 93.6 kg     Intake/Output Summary (Last 24 hours) at 08/24/2020 1208 Last data filed at 08/24/2020 0600 Gross per 24 hour  Intake 250 ml  Output 300 ml  Net -50 ml     Physical Exam  Awake Alert, No new F.N deficits, Normal affect [place].AT,PERRAL Supple Neck,No JVD, No cervical lymphadenopathy appriciated.  Symmetrical Chest wall movement, Good air movement  bilaterally, few rales RRR,No Gallops,Rubs or new Murmurs, No Parasternal Heave +ve B.Sounds, Abd Soft, No tenderness, No organomegaly appriciated, No rebound - guarding or rigidity. No Cyanosis, Clubbing or edema, No new Rash or bruise       Data Review:    CBC Recent Labs  Lab 08/18/20 0321 08/19/20 0240 08/20/20 0356 08/23/20 1834 08/23/20 1855 08/24/20 0020 08/24/20 0945  WBC 4.1 4.4 4.5 4.5  --   --  4.0  HGB 8.7* 8.3* 8.7* 9.2* 9.9* 9.5* 8.5*  HCT 28.3* 27.9* 28.9* 30.6* 29.0* 28.0* 28.5*  PLT 197 197 198 208  --   --  180  MCV 95.9 99.3 97.6 101.3*  --   --  100.0  MCH 29.5 29.5 29.4 30.5  --   --  29.8  MCHC 30.7 29.7* 30.1 30.1  --   --  29.8*  RDW 19.0* 18.8* 18.9* 20.6*  --   --  20.8*  LYMPHSABS  --   --   --  0.8  --   --   --   MONOABS  --   --   --  0.9  --   --   --   EOSABS  --   --   --  0.1  --   --    --   BASOSABS  --   --   --  0.1  --   --   --     Recent Labs  Lab 08/17/20 1250 08/18/20 0321 08/18/20 0321 08/19/20 0240 08/20/20 0356 08/23/20 1855 08/23/20 2002 08/24/20 0020 08/24/20 0945  NA  --  142   < > 142 142 141 137 142 145  K  --  3.4*   < > 3.6 4.1 3.3* 4.6 3.3* 3.5  CL  --  90*  --  92* 90*  --  88*  --  91*  CO2  --  47*  --  45* 48*  --  39*  --  49*  GLUCOSE  --  78  --  108* 99  --  80  --  89  BUN  --  11  --  13 13  --  22  --  17  CREATININE  --  0.94  --  0.93 0.95  --  1.10  --  1.06  CALCIUM  --  8.6*  --  8.6* 9.1  --  8.6*  --  8.7*  AST  --   --   --   --   --   --   --   --  22  ALT  --   --   --   --   --   --   --   --  17  ALKPHOS  --   --   --   --   --   --   --   --  88  BILITOT  --   --   --   --   --   --   --   --  0.5  ALBUMIN  --   --   --   --   --   --   --   --  2.9*  MG 1.5* 1.4*  --  1.4* 1.7  --   --   --  1.3*  CRP  --   --   --   --   --   --   --   --  1.3*  DDIMER  --   --   --   --   --   --   --   --  1.15*  PROCALCITON  --   --   --   --   --   --   --   --  0.10  BNP  --   --   --   --   --   --   --   --  193.1*   < > = values in this interval not displayed.    ------------------------------------------------------------------------------------------------------------------ No results for input(s): CHOL, HDL, LDLCALC, TRIG, CHOLHDL, LDLDIRECT in the last 72 hours.  No results found for: HGBA1C ------------------------------------------------------------------------------------------------------------------ No results for input(s): TSH, T4TOTAL, T3FREE, THYROIDAB in the last 72 hours.  Invalid input(s): FREET3  Cardiac Enzymes No results for input(s): CKMB, TROPONINI, MYOGLOBIN in the last 168 hours.  Invalid input(s): CK ------------------------------------------------------------------------------------------------------------------    Component Value Date/Time   BNP 193.1 (H) 08/24/2020 0945    Micro  Results Recent Results (from the past 240 hour(s))  Resp Panel by RT-PCR (Flu A&B, Covid) Nasopharyngeal Swab     Status: None   Collection Time: 08/19/20 12:31 PM   Specimen: Nasopharyngeal Swab; Nasopharyngeal(NP) swabs in vial transport medium  Result Value Ref Range Status   SARS Coronavirus 2 by RT PCR NEGATIVE NEGATIVE Final    Comment: (NOTE) SARS-CoV-2 target nucleic acids are NOT DETECTED.  The SARS-CoV-2 RNA is generally detectable in upper respiratory specimens during the acute phase of infection. The lowest concentration of SARS-CoV-2 viral copies this assay can detect is 138 copies/mL. A negative result does not preclude SARS-Cov-2 infection and should not be used as the sole basis for treatment or other patient management decisions. A negative result may occur with  improper specimen collection/handling, submission of specimen other than nasopharyngeal swab, presence of viral mutation(s) within the areas targeted by this assay, and inadequate number of viral copies(<138 copies/mL). A negative result must be combined with clinical observations, patient history, and epidemiological information. The expected result is Negative.  Fact Sheet for Patients:  EntrepreneurPulse.com.au  Fact Sheet for Healthcare Providers:  IncredibleEmployment.be  This test is no t yet approved or cleared by the [place] FDA and  has been authorized for detection and/or diagnosis of SARS-CoV-2 by FDA under an Emergency Use Authorization (EUA). This EUA will remain  in effect (meaning this test can be used) for the duration of the COVID-19 declaration under Section 564(b)(1) of the Act, 21 U.S.C.section 360bbb-3(b)(1), unless the authorization is terminated  or revoked sooner.       Influenza A by PCR NEGATIVE NEGATIVE Final   Influenza B by PCR NEGATIVE NEGATIVE Final    Comment: (NOTE) The Xpert Xpress SARS-CoV-2/FLU/RSV plus assay is intended as  an aid in the diagnosis of influenza from Nasopharyngeal swab specimens and should not be used as a sole basis for treatment. Nasal washings and aspirates are unacceptable for Xpert Xpress SARS-CoV-2/FLU/RSV testing.  Fact Sheet for Patients: EntrepreneurPulse.com.au  Fact Sheet for Healthcare Providers: IncredibleEmployment.be  This test is not yet approved or cleared by the [place] FDA and has been authorized for detection and/or diagnosis of SARS-CoV-2 by FDA under an Emergency Use Authorization (EUA). This EUA will remain in effect (meaning this test can be used) for the duration of the COVID-19 declaration under Section 564(b)(1) of the Act, 21 U.S.C. section 360bbb-3(b)(1), unless the authorization is terminated or revoked.  Performed at [facility], [place]  [address] [place], [place] [address]   Resp Panel by RT-PCR (Flu A&B, Covid) Nasopharyngeal Swab     Status: None   Collection Time: 08/23/20  8:57 PM   Specimen: Nasopharyngeal Swab; Nasopharyngeal(NP) swabs in vial transport medium  Result Value Ref Range Status   SARS Coronavirus 2 by RT PCR NEGATIVE NEGATIVE Final    Comment: (NOTE) SARS-CoV-2 target nucleic acids are NOT DETECTED.  The SARS-CoV-2 RNA is generally detectable in upper respiratory specimens during the acute phase of infection. The lowest concentration of SARS-CoV-2 viral copies this assay can detect is 138 copies/mL. A negative result does not preclude SARS-Cov-2 infection and should not be used as the sole basis for treatment or other patient management decisions. A negative result may occur with  improper specimen collection/handling, submission of specimen other than nasopharyngeal swab, presence of viral mutation(s) within the areas targeted by this assay, and inadequate number of viral copies(<138 copies/mL). A negative result must be combined with clinical observations, patient history, and  epidemiological information. The expected result is Negative.  Fact Sheet for Patients:  EntrepreneurPulse.com.au  Fact Sheet for Healthcare Providers:  IncredibleEmployment.be  This test is no t yet approved or cleared by the [place] FDA and  has been authorized for detection and/or diagnosis of SARS-CoV-2 by FDA under an Emergency Use Authorization (EUA). This EUA will remain  in effect (meaning this test can be used) for the duration of the COVID-19 declaration under Section 564(b)(1) of the Act, 21 U.S.C.section 360bbb-3(b)(1), unless the authorization is terminated  or revoked sooner.       Influenza A by PCR NEGATIVE NEGATIVE Final   Influenza B by PCR NEGATIVE NEGATIVE Final    Comment: (NOTE) The Xpert Xpress SARS-CoV-2/FLU/RSV plus assay is intended as an aid in the diagnosis of influenza from Nasopharyngeal swab specimens and should not be used as a sole basis for treatment. Nasal washings and aspirates are unacceptable for Xpert Xpress SARS-CoV-2/FLU/RSV testing.  Fact Sheet for Patients: EntrepreneurPulse.com.au  Fact Sheet for Healthcare Providers: IncredibleEmployment.be  This test is not yet approved or cleared by the [place] FDA and has been authorized for detection and/or diagnosis of SARS-CoV-2 by FDA under an Emergency Use Authorization (EUA). This EUA will remain in effect (meaning this test can be used) for the duration of the COVID-19 declaration under Section 564(b)(1) of the Act, 21 U.S.C. section 360bbb-3(b)(1), unless the authorization is terminated or revoked.  Performed at [facility], [place] [address], [place], [place] [address]     Radiology Reports CT ABDOMEN PELVIS WO CONTRAST  Result Date: 08/10/2020 CLINICAL DATA:  Anemia, colitis. EXAM: CT ABDOMEN AND PELVIS WITHOUT CONTRAST TECHNIQUE: Multidetector CT imaging of the abdomen and pelvis was  performed following the standard protocol without IV contrast. COMPARISON:  July 26, 2020. FINDINGS: Lower chest: Bilateral pleural effusions are noted with adjacent subsegmental atelectasis. Hepatobiliary: No focal liver abnormality is seen. No gallstones, gallbladder wall thickening, or biliary dilatation. Pancreas: Unremarkable. No pancreatic ductal dilatation or surrounding inflammatory changes. Spleen: Normal in size without focal abnormality. Adrenals/Urinary Tract: Stable large calcification involving left adrenal gland consistent with old hematoma. Right adrenal gland appears normal. Small nonobstructive left renal calculus is noted. No hydronephrosis or renal obstruction is noted. Urinary bladder is decompressed secondary to Foley catheter. Stomach/Bowel: The stomach appears normal. There is no evidence of bowel obstruction. Status post appendectomy. There remains wall thickening involving the proximal os ascending colon, but it appears to be significantly improved compared to prior  exam. Vascular/Lymphatic: Aortic atherosclerosis. No enlarged abdominal or pelvic lymph nodes. Reproductive: Prostate is unremarkable. Other: Mild anasarca is noted.  No ascites or hernia is noted. Musculoskeletal: No acute or significant osseous findings. IMPRESSION: 1. Bilateral pleural effusions are noted with adjacent subsegmental atelectasis. 2. Stable large calcification involving left adrenal gland consistent with old hematoma. 3. Small nonobstructive left renal calculus. No hydronephrosis or renal obstruction is noted. 4. There remains wall thickening involving the proximal ascending colon, but it appears to be significantly improved compared to prior exam. 5. Mild anasarca is noted. 6. Aortic atherosclerosis. Aortic Atherosclerosis (ICD10-I70.0). Electronically Signed   By: Marijo Conception M.D.   On: 08/10/2020 19:25   CT HEAD WO CONTRAST  Result Date: 08/24/2020 CLINICAL DATA:  Delirium, acute metabolic  encephalopathy EXAM: CT HEAD WITHOUT CONTRAST TECHNIQUE: Contiguous axial images were obtained from the base of the skull through the vertex without intravenous contrast. COMPARISON:  Recent prior CT scan of the head 08/05/2020 FINDINGS: Brain: No evidence of acute infarction, hemorrhage, hydrocephalus, extra-axial collection or mass lesion/mass effect. Stable mild cerebral and cerebellar cortical atrophy. Trace chronic microvascular ischemic white matter changes. Vascular: No hyperdense vessel or unexpected calcification. Skull: Normal. Negative for fracture or focal lesion. Sinuses/Orbits: No acute finding.  Bilateral scleral banding. Other: None. IMPRESSION: 1. No acute intracranial abnormality. 2. Stable mild atrophy and chronic microvascular ischemic white matter changes. Electronically Signed   By: Jacqulynn Cadet M.D.   On: 08/24/2020 11:48   CT HEAD WO CONTRAST  Result Date: 08/05/2020 CLINICAL DATA:  Expressive aphasia with stroke suspected EXAM: CT HEAD WITHOUT CONTRAST TECHNIQUE: Contiguous axial images were obtained from the base of the skull through the vertex without intravenous contrast. COMPARISON:  Ten days ago FINDINGS: Brain: No evidence of acute infarction, hemorrhage, hydrocephalus, extra-axial collection or mass lesion/mass effect. Generalized atrophy. Age normal appearance of white matter. Vascular: No hyperdense vessel or unexpected calcification. Skull: Normal. Negative for fracture or focal lesion. Sinuses/Orbits: No acute finding. Bilateral scleral banding and cataract resection. IMPRESSION: Stable head CT. No visible infarct or other change from recent comparison. Electronically Signed   By: Monte Fantasia M.D.   On: 08/05/2020 05:01   CT HEAD WO CONTRAST  Result Date: 07/26/2020 CLINICAL DATA:  Trauma. Unwitnessed fall. EXAM: CT HEAD WITHOUT CONTRAST CT CERVICAL SPINE WITHOUT CONTRAST TECHNIQUE: Multidetector CT imaging of the head and cervical spine was performed following the  standard protocol without intravenous contrast. Multiplanar CT image reconstructions of the cervical spine were also generated. COMPARISON:  CT cervical spine August 30, 2011. FINDINGS: CT HEAD FINDINGS Brain: No evidence of acute large vascular territory infarction, hemorrhage, hydrocephalus, extra-axial collection or mass lesion/mass effect. Mild for age scattered white matter hypodensities, most likely related to chronic microvascular ischemic disease. Mild generalized cerebral atrophy with ex vacuo ventricular dilation. Vascular: Calcific atherosclerosis. No hyperdense vessel identified. Skull: No acute fracture.  Left frontal scalp contusion Sinuses/Orbits: Visualized sinuses are clear. Bilateral scleral buckles. Other: No mastoid effusions. CT CERVICAL SPINE FINDINGS Alignment: Mild anterolisthesis of C4 on C5, favor degenerative given facet arthropathy at this level. Broad dextrocurvature of the cervical spine. Skull base and vertebrae: Vertebral body heights are maintained. No evidence of acute fracture. Soft tissues and spinal canal: No prevertebral fluid or swelling. No visible canal hematoma. Disc levels: Left eccentric degenerative disc disease in the lower cervical spine, greatest at C7-T1 with disc height loss and endplate sclerosis. Bulky facet hypertrophy at multiple levels, greatest on the left at C4-C5 where  there is likely at least moderate bony foraminal stenosis. Upper chest: Emphysema. Lung apices appear clear although evaluation is limited by motion. IMPRESSION: CT head: 1. No evidence of acute intracranial abnormality. 2. Left frontal scalp contusion without acute fracture. CT cervical spine: 1. No evidence of acute fracture. 2. Rotation of C1 on C2 is likely positional in the absence of a fixed torticollis. 3. Bulky facet hypertrophy at multiple levels, greatest on the left at C4-C5 where there is likely at least moderate bony foraminal stenosis. 4. Left eccentric degenerative disc disease in  the lower cervical spine, greatest at C7-T1. Electronically Signed   By: Margaretha Sheffield MD   On: 07/26/2020 17:59   CT CERVICAL SPINE WO CONTRAST  Result Date: 07/26/2020 CLINICAL DATA:  Trauma. Unwitnessed fall. EXAM: CT HEAD WITHOUT CONTRAST CT CERVICAL SPINE WITHOUT CONTRAST TECHNIQUE: Multidetector CT imaging of the head and cervical spine was performed following the standard protocol without intravenous contrast. Multiplanar CT image reconstructions of the cervical spine were also generated. COMPARISON:  CT cervical spine August 30, 2011. FINDINGS: CT HEAD FINDINGS Brain: No evidence of acute large vascular territory infarction, hemorrhage, hydrocephalus, extra-axial collection or mass lesion/mass effect. Mild for age scattered white matter hypodensities, most likely related to chronic microvascular ischemic disease. Mild generalized cerebral atrophy with ex vacuo ventricular dilation. Vascular: Calcific atherosclerosis. No hyperdense vessel identified. Skull: No acute fracture.  Left frontal scalp contusion Sinuses/Orbits: Visualized sinuses are clear. Bilateral scleral buckles. Other: No mastoid effusions. CT CERVICAL SPINE FINDINGS Alignment: Mild anterolisthesis of C4 on C5, favor degenerative given facet arthropathy at this level. Broad dextrocurvature of the cervical spine. Skull base and vertebrae: Vertebral body heights are maintained. No evidence of acute fracture. Soft tissues and spinal canal: No prevertebral fluid or swelling. No visible canal hematoma. Disc levels: Left eccentric degenerative disc disease in the lower cervical spine, greatest at C7-T1 with disc height loss and endplate sclerosis. Bulky facet hypertrophy at multiple levels, greatest on the left at C4-C5 where there is likely at least moderate bony foraminal stenosis. Upper chest: Emphysema. Lung apices appear clear although evaluation is limited by motion. IMPRESSION: CT head: 1. No evidence of acute intracranial abnormality.  2. Left frontal scalp contusion without acute fracture. CT cervical spine: 1. No evidence of acute fracture. 2. Rotation of C1 on C2 is likely positional in the absence of a fixed torticollis. 3. Bulky facet hypertrophy at multiple levels, greatest on the left at C4-C5 where there is likely at least moderate bony foraminal stenosis. 4. Left eccentric degenerative disc disease in the lower cervical spine, greatest at C7-T1. Electronically Signed   By: Margaretha Sheffield MD   On: 07/26/2020 17:59   US RENAL  Result Date: 07/28/2020 CLINICAL DATA:  Acute kidney injury EXAM: RENAL / URINARY TRACT ULTRASOUND COMPLETE COMPARISON:  None. FINDINGS: Right Kidney: Renal measurements: 10 x 6.2 x 5.7 cm = volume: 184.4 mL. Echogenicity within normal limits. No mass or hydronephrosis visualized. Left Kidney: Renal measurements: 10.2 x 6.6 x 6 cm = volume: 211.7 mL. Echogenicity within normal limits. 1.8 cm cyst at the upper pole. No hydronephrosis visualized. Bladder: Decompressed and not visualized Other: None. IMPRESSION: No hydronephrosis. Electronically Signed   By: Macy Mis M.D.   On: 07/28/2020 16:23   DG Pelvis Portable  Result Date: 07/26/2020 CLINICAL DATA:  Recent fall with pelvic pain, initial encounter EXAM: PORTABLE PELVIS 1-2 VIEWS COMPARISON:  02/24/2020 FINDINGS: Pelvic ring is intact. No acute fracture or dislocation is noted. Postsurgical  changes are seen. No soft tissue abnormality is noted. Degenerative changes of lumbar spine are noted as well. Left upper quadrant calcification is noted similar to that seen on prior PET-CT. IMPRESSION: No acute abnormality noted. Electronically Signed   By: Inez Catalina M.D.   On: 07/26/2020 17:55   DG Chest Port 1 View  Result Date: 08/24/2020 CLINICAL DATA:  Shortness of breath EXAM: PORTABLE CHEST 1 VIEW COMPARISON:  August 23, 2020 FINDINGS: Ill-defined airspace opacity right base is stable. Visualized lung otherwise clear. Note that a small portion of the  lateral left base is not imaged. Heart size and pulmonary vascular normal. No adenopathy. There is aortic atherosclerosis. No bone lesions. IMPRESSION: Ill-defined opacity right base which may represent early developing pneumonia. Appearance stable compared to 1 day prior. Lungs elsewhere clear. Heart size normal. Aortic Atherosclerosis (ICD10-I70.0). Electronically Signed   By: Lowella Grip III M.D.   On: 08/24/2020 08:08   DG Chest Port 1 View  Result Date: 08/23/2020 CLINICAL DATA:  80 year old male with history of dyspnea and altered mental status. EXAM: PORTABLE CHEST 1 VIEW COMPARISON:  Chest x-ray 07/30/2020. FINDINGS: Lung volumes are low. Diffuse peribronchial cuffing and scattered areas of ill-defined interstitial prominence noted throughout the lungs bilaterally, most evident in the medial aspect of the right upper lobe and medial aspect of the lower right lung. Trace right pleural effusion. No left pleural effusion. No pneumothorax. No evidence of pulmonary edema. Mild cardiomegaly. Upper mediastinal contours are within normal limits. Aortic atherosclerosis. IMPRESSION: 1. The appearance of the chest is concerning for bronchitis with developing multilobar bronchopneumonia. 2. Trace right pleural effusion. 3. Mild cardiomegaly. 4. Aortic atherosclerosis. Electronically Signed   By: Vinnie Langton M.D.   On: 08/23/2020 19:35   DG Chest Port 1 View  Result Date: 07/30/2020 CLINICAL DATA:  Respiratory failure EXAM: PORTABLE CHEST 1 VIEW COMPARISON:  07/26/2020 FINDINGS: The lungs are symmetrically well inflated. Minimal left basilar atelectasis. Mild coarsening of the pulmonary interstitium is again noted. No pneumothorax or pleural effusion. Cardiac size is mildly enlarged. Pulmonary vasculature is normal. No acute bone abnormality. IMPRESSION: No active disease.  Stable cardiomegaly. Electronically Signed   By: Fidela Salisbury MD   On: 07/30/2020 07:04   DG Chest Port 1 View  Result Date:  07/26/2020 CLINICAL DATA:  Recent fall EXAM: PORTABLE CHEST 1 VIEW COMPARISON:  07/06/2020 FINDINGS: Cardiac shadow is mildly enlarged but stable. Aortic calcifications are seen. Known right lower lobe mass lesion is not well appreciated on this exam. No infiltrate or sizable effusion is seen. No acute bony abnormality is noted. IMPRESSION: No acute abnormality noted. Electronically Signed   By: Inez Catalina M.D.   On: 07/26/2020 17:57   EEG adult  Result Date: 07/27/2020 Lora Havens, MD     07/27/2020 10:35 AM Patient Name: JEDEDIAH NODA MRN: [id number] Epilepsy Attending: Lora Havens Referring Physician/Provider: Dr. Kerney Elbe Date: 07/27/2020 Duration: 23.04 mins  Patient history: 80yo M with ams. EEG to evaluate for seizure  Level of alertness: Awake  AEDs during EEG study: None  Technical aspects: This EEG study was done with scalp electrodes positioned according to the 10-20 International system of electrode placement. Electrical activity was acquired at a sampling rate of 500Hz  and reviewed with a high frequency filter of 70Hz  and a low frequency filter of 1Hz . EEG data were recorded continuously and digitally stored.  Description: EEG showed continuous generalized 4.5-5Hz  theta slowing. Hyperventilation and photic stimulation were not performed.  ABNORMALITY -Continuous slow, generalized  IMPRESSION: This study is suggestive of moderate diffuse encephalopathy, nonspecific etiology. No seizures or epileptiform discharges were seen throughout the recording.   Priyanka O Yadav   VAS [place] LOWER EXTREMITY VENOUS (DVT)  Result Date: 08/09/2020  Lower Venous DVT Study Indications: Swelling, and Pain.  Comparison Study: No prior study Performing Technologist: Sharion Dove RVS  Examination Guidelines: A complete evaluation includes B-mode imaging, spectral Doppler, color Doppler, and power Doppler as needed of all accessible portions of each vessel. Bilateral testing is considered an  integral part of a complete examination. Limited examinations for reoccurring indications may be performed as noted. The reflux portion of the exam is performed with the patient in reverse Trendelenburg.  +-----+---------------+---------+-----------+----------+--------------+ RIGHTCompressibilityPhasicitySpontaneityPropertiesThrombus Aging +-----+---------------+---------+-----------+----------+--------------+ CFV  Full           Yes      Yes                                 +-----+---------------+---------+-----------+----------+--------------+   +---------+---------------+---------+-----------+----------+--------------+ LEFT     CompressibilityPhasicitySpontaneityPropertiesThrombus Aging +---------+---------------+---------+-----------+----------+--------------+ CFV      Full           Yes      Yes                                 +---------+---------------+---------+-----------+----------+--------------+ SFJ      Full                                                        +---------+---------------+---------+-----------+----------+--------------+ FV Prox  Full                                                        +---------+---------------+---------+-----------+----------+--------------+ FV Mid   Full                                                        +---------+---------------+---------+-----------+----------+--------------+ FV DistalFull                                                        +---------+---------------+---------+-----------+----------+--------------+ PFV      Full                                                        +---------+---------------+---------+-----------+----------+--------------+ POP      Full           Yes      Yes                                 +---------+---------------+---------+-----------+----------+--------------+  PTV      Full                                                         +---------+---------------+---------+-----------+----------+--------------+ PERO     Full                                                        +---------+---------------+---------+-----------+----------+--------------+     Summary: RIGHT: - No evidence of common femoral vein obstruction.  LEFT: - There is no evidence of deep vein thrombosis in the lower extremity.  *See table(s) above for measurements and observations. Electronically signed by Servando Snare MD on 08/09/2020 at 11:19:19 AM.    Final    ECHOCARDIOGRAM LIMITED  Result Date: 07/27/2020    ECHOCARDIOGRAM LIMITED REPORT   Patient Name:   LONNEL GJERDE Date of Exam: 07/27/2020 Medical Rec #:  [id number]          Height:       72.0 in Accession #:    [id number]         Weight:       214.5 lb Date of Birth:  09/11/1940          BSA:          2.195 m Patient Age:    61 years           BP:           105/57 mmHg Patient Gender: M                  HR:           77 bpm. Exam Location:  Inpatient Procedure: Limited Echo, Cardiac Doppler and Color Doppler Indications:    Congestive Heart Failure I50.9  History:        Patient has prior history of Echocardiogram examinations, most                 recent 03/03/2020. COPD, Arrythmias:Atrial Fibrillation; Risk                 Factors:Hypertension, Dyslipidemia and Former Smoker.  Sonographer:    Vickie Epley RDCS Referring Phys: [place]  1. Left ventricular ejection fraction, by estimation, is 55 to 60%. The left ventricle has normal function. Left ventricular diastolic function could not be evaluated.  2. The mitral valve is grossly normal. No evidence of mitral stenosis.  3. Tricuspid valve regurgitation is mild to moderate.  4. The aortic valve is grossly normal. Aortic valve regurgitation is trivial. No aortic stenosis is present.  5. There is moderately elevated pulmonary artery systolic pressure. FINDINGS  Left Ventricle: Left ventricular ejection fraction, by estimation,  is 55 to 60%. The left ventricle has normal function. Left ventricular diastolic function could not be evaluated. Left ventricular diastolic function could not be evaluated due to atrial  fibrillation. Right Ventricle: There is moderately elevated pulmonary artery systolic pressure. The tricuspid regurgitant velocity is 2.77 m/s, and with an assumed right atrial pressure of 15 mmHg, the estimated right ventricular systolic pressure is 27.7 mmHg. Mitral Valve: The mitral valve is grossly normal. No evidence  of mitral valve stenosis. Tricuspid Valve: The tricuspid valve is normal in structure. Tricuspid valve regurgitation is mild to moderate. Aortic Valve: The aortic valve is grossly normal. Aortic valve regurgitation is trivial. No aortic stenosis is present. Pulmonic Valve: The pulmonic valve was normal in structure. Pulmonic valve regurgitation is trivial. Aorta: The aortic root and ascending aorta are structurally normal, with no evidence of dilitation. LEFT VENTRICLE PLAX 2D LVIDd:         5.00 cm LVIDs:         3.80 cm LV PW:         1.00 cm LV IVS:        1.00 cm LVOT diam:     2.40 cm LVOT Area:     4.52 cm  LV Volumes (MOD) LV vol d, MOD A2C: 112.0 ml LV vol d, MOD A4C: 101.0 ml LV vol s, MOD A2C: 56.8 ml LV vol s, MOD A4C: 55.4 ml LV SV MOD A2C:     55.2 ml LV SV MOD A4C:     101.0 ml LV SV MOD BP:      49.6 ml RIGHT VENTRICLE TAPSE (M-mode): 1.5 cm LEFT ATRIUM         Index LA diam:    5.10 cm 2.32 cm/m   AORTA Ao Root diam: 3.80 cm Ao Asc diam:  3.60 cm TRICUSPID VALVE TR Peak grad:   30.7 mmHg TR Vmax:        277.00 cm/s  SHUNTS Systemic Diam: 2.40 cm Mertie Moores MD Electronically signed by Mertie Moores MD Signature Date/Time: 07/27/2020/3:32:59 PM    Final    CT CHEST ABDOMEN PELVIS WO CONTRAST  Result Date: 07/26/2020 CLINICAL DATA:  Sepsis and leukocytosis, recent fall, history of lung carcinoma EXAM: CT CHEST, ABDOMEN AND PELVIS WITHOUT CONTRAST TECHNIQUE: Multidetector CT imaging of the chest,  abdomen and pelvis was performed following the standard protocol without IV contrast. COMPARISON:  Chest x-ray and pelvis film from earlier in the same day., 07/06/2020 FINDINGS: CT CHEST FINDINGS Cardiovascular: Atherosclerotic calcifications of the aorta are noted without aneurysmal dilatation. No cardiac enlargement is seen. No pericardial effusion is noted. 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 Mediastinum/Nodes: Thoracic inlet is within normal limits. No sizable hilar or mediastinal adenopathy is noted. The esophagus as visualized is within normal limits. Lungs/Pleura: Left lung is well aerated with mild emphysematous changes. No focal infiltrate or sizable effusion is seen. Previously seen bilobed right lower lobe mass lesion is again identified and stable. Small right-sided pleural effusion is noted stable from the prior exam. Musculoskeletal: Degenerative changes of the thoracic spine are noted. No acute rib abnormality is seen. CT ABDOMEN PELVIS FINDINGS Hepatobiliary: No focal liver abnormality is seen. No gallstones, gallbladder wall thickening, or biliary dilatation. Pancreas: Unremarkable. No pancreatic ductal dilatation or surrounding inflammatory changes. Spleen: Normal in size without focal abnormality. Adrenals/Urinary Tract: Adrenal glands again demonstrate calcification on the left consistent with prior adrenal hematoma. The overall appearance is stable. Right adrenal gland is within normal limits. Kidneys are well visualized bilaterally. Tiny nonobstructing left renal stones are noted. Ureters are within normal limits. Bladder is decompressed by Foley catheter. No obstructive changes are seen. Stomach/Bowel: Colon is well visualized with some fluid within. Considerable wall thickening is noted within the ascending colon new from prior PET-CT from 02/24/2020. These changes are consistent with focal colitis without evidence of perforation. Some pericolonic inflammatory changes are seen. Small  bowel and stomach appear within normal limits. The appendix is  not well visualized. Vascular/Lymphatic: Aortic atherosclerosis. No enlarged abdominal or pelvic lymph nodes. Reproductive: Prostate is unremarkable. Other: No abdominal wall hernia or abnormality. No abdominopelvic ascites. Musculoskeletal: No acute or significant osseous findings. IMPRESSION: CT of the chest: Stable right lower lobe mass lesion with associated right effusion similar to that seen on the prior exam. CT of the abdomen and pelvis: Tiny nonobstructing left renal stone. Changes of ascending colitis without evidence of perforation or abscess formation. Stable calcified left adrenal hematoma. Aortic Atherosclerosis (ICD10-I70.0) and Emphysema (ICD10-J43.9). Electronically Signed   By: Inez Catalina M.D.   On: 07/26/2020 23:08

## 2020-08-24 NOTE — Progress Notes (Signed)
Patient transported from ED Room 10 to 8I51 with no complications.

## 2020-08-24 NOTE — Progress Notes (Signed)
EEG complete - results pending 

## 2020-08-24 NOTE — Progress Notes (Signed)
Pt c/o chronic mild lower back pain. Pt states he takes tylenol 650mg  at home for this. Notified MD and received orders. Will administer and monitor patient

## 2020-08-24 NOTE — Progress Notes (Addendum)
Pt  Has Bipap PRN order. No distress noted at this time. Bipap on standby if needed.

## 2020-08-25 DIAGNOSIS — J9621 Acute and chronic respiratory failure with hypoxia: Secondary | ICD-10-CM | POA: Diagnosis not present

## 2020-08-25 DIAGNOSIS — J9622 Acute and chronic respiratory failure with hypercapnia: Secondary | ICD-10-CM | POA: Diagnosis not present

## 2020-08-25 DIAGNOSIS — R4182 Altered mental status, unspecified: Secondary | ICD-10-CM

## 2020-08-25 DIAGNOSIS — Z515 Encounter for palliative care: Secondary | ICD-10-CM

## 2020-08-25 DIAGNOSIS — Z7189 Other specified counseling: Secondary | ICD-10-CM

## 2020-08-25 DIAGNOSIS — Z66 Do not resuscitate: Secondary | ICD-10-CM

## 2020-08-25 LAB — COMPREHENSIVE METABOLIC PANEL
ALT: 17 U/L (ref 0–44)
AST: 24 U/L (ref 15–41)
Albumin: 2.9 g/dL — ABNORMAL LOW (ref 3.5–5.0)
Alkaline Phosphatase: 88 U/L (ref 38–126)
Anion gap: 4 — ABNORMAL LOW (ref 5–15)
BUN: 20 mg/dL (ref 8–23)
CO2: 46 mmol/L — ABNORMAL HIGH (ref 22–32)
Calcium: 8.6 mg/dL — ABNORMAL LOW (ref 8.9–10.3)
Chloride: 89 mmol/L — ABNORMAL LOW (ref 98–111)
Creatinine, Ser: 1.13 mg/dL (ref 0.61–1.24)
GFR, Estimated: >60 mL/min (ref >60)
Glucose, Bld: 120 mg/dL — ABNORMAL HIGH (ref 70–99)
Potassium: 4.1 mmol/L (ref 3.5–5.1)
Sodium: 139 mmol/L (ref 135–145)
Total Bilirubin: 0.5 mg/dL (ref 0.3–1.2)
Total Protein: 5.7 g/dL — ABNORMAL LOW (ref 6.5–8.1)

## 2020-08-25 LAB — CBC WITH DIFFERENTIAL/PLATELET
Abs Immature Granulocytes: 0.03 10*3/uL (ref 0.00–0.07)
Basophils Absolute: 0 10*3/uL (ref 0.0–0.1)
Basophils Relative: 1 %
Eosinophils Absolute: 0.1 10*3/uL (ref 0.0–0.5)
Eosinophils Relative: 1 %
HCT: 27.2 % — ABNORMAL LOW (ref 39.0–52.0)
Hemoglobin: 8.1 g/dL — ABNORMAL LOW (ref 13.0–17.0)
Immature Granulocytes: 1 %
Lymphocytes Relative: 12 %
Lymphs Abs: 0.6 10*3/uL — ABNORMAL LOW (ref 0.7–4.0)
MCH: 29.9 pg (ref 26.0–34.0)
MCHC: 29.8 g/dL — ABNORMAL LOW (ref 30.0–36.0)
MCV: 100.4 fL — ABNORMAL HIGH (ref 80.0–100.0)
Monocytes Absolute: 0.3 10*3/uL (ref 0.1–1.0)
Monocytes Relative: 7 %
Neutro Abs: 3.8 10*3/uL (ref 1.7–7.7)
Neutrophils Relative %: 78 %
Platelets: 161 10*3/uL (ref 150–400)
RBC: 2.71 MIL/uL — ABNORMAL LOW (ref 4.22–5.81)
RDW: 21.1 % — ABNORMAL HIGH (ref 11.5–15.5)
WBC: 4.9 10*3/uL (ref 4.0–10.5)
nRBC: 0 % (ref 0.0–0.2)

## 2020-08-25 LAB — BRAIN NATRIURETIC PEPTIDE: B Natriuretic Peptide: 272 pg/mL — ABNORMAL HIGH (ref 0.0–100.0)

## 2020-08-25 LAB — PROTIME-INR
INR: 1.2 (ref 0.8–1.2)
Prothrombin Time: 14.7 seconds (ref 11.4–15.2)

## 2020-08-25 LAB — PROCALCITONIN: Procalcitonin: <0.1 ng/mL

## 2020-08-25 LAB — C-REACTIVE PROTEIN: CRP: 2.3 mg/dL — ABNORMAL HIGH (ref <1.0)

## 2020-08-25 LAB — PHOSPHORUS: Phosphorus: 3 mg/dL (ref 2.5–4.6)

## 2020-08-25 LAB — APTT: aPTT: 33 seconds (ref 24–36)

## 2020-08-25 LAB — D-DIMER, QUANTITATIVE: D-Dimer, Quant: 1.31 ug/mL-FEU — ABNORMAL HIGH (ref 0.00–0.50)

## 2020-08-25 LAB — MAGNESIUM: Magnesium: 2.4 mg/dL (ref 1.7–2.4)

## 2020-08-25 LAB — GLUCOSE, CAPILLARY: Glucose-Capillary: 166 mg/dL — ABNORMAL HIGH (ref 70–99)

## 2020-08-25 MED ORDER — IPRATROPIUM-ALBUTEROL 0.5-2.5 (3) MG/3ML IN SOLN
3.0000 mL | Freq: Three times a day (TID) | RESPIRATORY_TRACT | Status: DC
  Administered 2020-08-26 (×2): 3 mL via RESPIRATORY_TRACT
  Filled 2020-08-25 (×2): qty 3

## 2020-08-25 MED ORDER — ARFORMOTEROL TARTRATE 15 MCG/2ML IN NEBU
15.0000 ug | INHALATION_SOLUTION | Freq: Two times a day (BID) | RESPIRATORY_TRACT | Status: DC
  Administered 2020-08-25 – 2020-08-27 (×4): 15 ug via RESPIRATORY_TRACT
  Filled 2020-08-25 (×4): qty 2

## 2020-08-25 MED ORDER — IPRATROPIUM-ALBUTEROL 0.5-2.5 (3) MG/3ML IN SOLN
3.0000 mL | Freq: Four times a day (QID) | RESPIRATORY_TRACT | Status: DC
  Administered 2020-08-25: 3 mL via RESPIRATORY_TRACT
  Filled 2020-08-25: qty 3

## 2020-08-25 MED ORDER — IPRATROPIUM-ALBUTEROL 0.5-2.5 (3) MG/3ML IN SOLN: 3.0000 mL | Freq: Two times a day (BID) | RESPIRATORY_TRACT | Status: DC

## 2020-08-25 MED ORDER — BUDESONIDE 0.5 MG/2ML IN SUSP: 0.2500 mg | Freq: Four times a day (QID) | RESPIRATORY_TRACT | Status: DC

## 2020-08-25 MED ORDER — BUDESONIDE 0.5 MG/2ML IN SUSP
0.2500 mg | Freq: Two times a day (BID) | RESPIRATORY_TRACT | Status: DC
  Administered 2020-08-25 – 2020-08-27 (×4): 0.25 mg via RESPIRATORY_TRACT
  Filled 2020-08-25 (×4): qty 2

## 2020-08-25 MED ORDER — REVEFENACIN 175 MCG/3ML IN SOLN
175.0000 ug | Freq: Every day | RESPIRATORY_TRACT | Status: DC
  Administered 2020-08-26 – 2020-08-27 (×2): 175 ug via RESPIRATORY_TRACT
  Filled 2020-08-25 (×4): qty 3

## 2020-08-25 NOTE — Progress Notes (Signed)
RT at bedside to place pt on BIpap for the night.  Pt stated he is not ready to go on BIpap at this time.

## 2020-08-25 NOTE — Evaluation (Signed)
Occupational Therapy Evaluation Patient Details Name: Todd Lloyd MRN: [id number] DOB: 10/08/1940 Today's Date: 08/25/2020    History of Present Illness 80 y.o. male presents to ED 08/23/20 when EMS was activated at SNF due to patient having breathing difficulty.  Patient was reported to transiently desatted to 47 on 5 LPM via [place]. Recent long hospitalization due to acute on chronic blood loss anemia, septic shock due to rotavirus and enteropathogenic E. coli diarrhea, while hospitalized AKI due to hypovolemia and acute hypoxic and hypercarbic respiratory failure due COPD exacerbation discharged on 4/1. In ED, pt showed macrocytic anemia, and BMP showing hypokalemia and elevated CO2. Pt placed on biPAP and O2 sat 96-100% Chest x-ray showed appearance of chest concerning for bronchitis with developing multilobar bronchopneumonia and trace right pleural effusion and mild cardiomegaly.  Admitted for observation of Acute on chronic respiratory failure with hypoxia and hypercapnia PMH: COPD, stage I non-small cell lung cancer s/p radiation in 04/2020, dementia, paroxysmal A. fib on Eliquis, GERD, leukemia s/p bone marrow transplantation in 1990.   Clinical Impression   Pt admitted from short term rehab at SNF. 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 Pt presents now with deficits in strength, endurance, cardiopulmonary tolerance, standing balance, and cognition. Pt benefits from consistent cues for safety and pacing throughout session as pt pushes self even with O2 desats to 81% on 3 L during tasks. Pt overall min guard for mobility to sink with RW for ADLs. Pt requires Min A for UB ADLs and Mod A for LB ADLs due to deficits. Pt with condom cath malfunction and bowel incontinence during session requiring various levels of assist. Pt motivated to return home to spouse and regain independence.  Recommend DC back to SNF for rehab.     Follow Up Recommendations  SNF    Equipment Recommendations  None recommended by OT    Recommendations for Other Services       Precautions / Restrictions Precautions Precautions: Fall Precaution Comments: watch sats and HR Restrictions Weight Bearing Restrictions: No      Mobility Bed Mobility Overal bed mobility: Needs Assistance Bed Mobility: Supine to Sit;Sit to Supine     Supine to sit: Supervision;HOB elevated Sit to supine: Supervision   General bed mobility comments: No assist needed, only assist with line mgmt    Transfers Overall transfer level: Needs assistance Equipment used: Rolling walker (2 wheeled) Transfers: Sit to/from [facility] Sit to Stand: Min guard Stand pivot transfers: Min guard       General transfer comment: min guard for sit to stand with RW, cues for hand placement and safe sequencing. able to mobilize to/from sink with RW    Balance Overall balance assessment: Needs assistance Sitting-balance support: Feet supported;No upper extremity supported Sitting balance-Leahy Scale: Fair     Standing balance support: Bilateral upper extremity supported Standing balance-Leahy Scale: Fair Standing balance comment: fair static standing at sink, need for UE support for mobility and when fatigued                           ADL either performed or assessed with clinical judgement   ADL Overall ADL's : Needs assistance/impaired Eating/Feeding: Set up;Sitting   Grooming: Min guard;Standing;Wash/dry face;Oral care;Brushing hair Grooming Details (indicate cue type and reason): min guard for safety in standing, difficulty maintaining upright posture when fatigued Upper  Body Bathing: Minimal assistance;Sitting   Lower Body Bathing: Moderate assistance;Sit to/from stand;Sitting/lateral leans Lower Body Bathing Details (indicate cue type and reason): Able to bathe LEs/peri care  after condom cath malfunction at min guard but noted with bowel incontinence on return to bed, requiring Max A for posterior hygiene in bed Upper Body Dressing : Minimal assistance;Sitting Upper Body Dressing Details (indicate cue type and reason): Min A with line mgmt, line safety Lower Body Dressing: Sit to/from stand;Sitting/lateral leans;Moderate assistance Lower Body Dressing Details (indicate cue type and reason): Able to doff socks, don clean ones with Min A. Then after bowel incontinence, pt stepped in BM, requiring Max A for hygiene in changing socks Toilet Transfer: Min guard;Ambulation;RW   Toileting- Clothing Manipulation and Hygiene: Moderate assistance;Sit to/from stand Toileting - Clothing Manipulation Details (indicate cue type and reason): Overall Mod A in conjunction with LB bathing today     Functional mobility during ADLs: Min guard;Rolling walker;Cueing for sequencing;Cueing for safety General ADL Comments: Limited by decreased safety awareness, decreased cardiopulmonary tolerance requiring cues for pacing, rest breaks, etc.     Vision Baseline Vision/History: Wears glasses Wears Glasses: At all times Patient Visual Report: No change from baseline Vision Assessment?: No apparent visual deficits     Perception     Praxis      Pertinent Vitals/Pain Pain Assessment: No/denies pain     Hand Dominance Right   Extremity/Trunk Assessment Upper Extremity Assessment Upper Extremity Assessment: Generalized weakness   Lower Extremity Assessment Lower Extremity Assessment: Defer to PT evaluation   Cervical / Trunk Assessment Cervical / Trunk Assessment: Kyphotic   Communication Communication Communication: No difficulties   Cognition Arousal/Alertness: Awake/alert Behavior During Therapy: WFL for tasks assessed/performed Overall Cognitive Status: No family/caregiver present to determine baseline cognitive functioning Area of Impairment:  Orientation;Memory;Following commands;Problem solving;Safety/judgement;Awareness                 Orientation Level: Disoriented to;Situation   Memory: Decreased short-term memory;Decreased recall of precautions Following Commands: Follows multi-step commands with increased time;Follows one step commands consistently;Follows multi-step commands consistently Safety/Judgement: Decreased awareness of safety;Decreased awareness of deficits Awareness: Emergent Problem Solving: Slow processing;Requires verbal cues;Requires tactile cues General Comments: A&Ox3 though he reports he was brought in to hospital due to fall (per chart, AMS). Poor safety awareness, impulsive behaviors benefiting from consistent cues for safety.   General Comments  Received on 4 L O2, able to titrate down to 3 L O2 by end of session. Desats when removed O2 for washing face and during extended actiivty with 3 L O2 to 81%. Cues needed for pursed lip breathing, seated rest break with pt quickly recovering to 91% on 3 L O2    Exercises     Shoulder Instructions      Home Living Family/patient expects to be discharged to:: Skilled nursing facility Living Arrangements: Spouse/significant other (pt is caregiver for spouse) Available Help at Discharge: Family;Available PRN/intermittently Type of Home: House Home Access: Stairs to enter [employer] of Steps: 2   Home Layout: One level     Bathroom Shower/Tub: Walk-in shower;Tub/shower unit   Bathroom Toilet: Standard     Home Equipment: Environmental consultant - 2 wheels;Wheelchair - manual;Shower seat;Hand held shower head          Prior Functioning/Environment Level of Independence: Needs assistance  Gait / Transfers Assistance Needed: ambulating with RW and supervision or using w/c independently at rehab. prior to rehab, pt was independent with mobility ADL's / Homemaking Assistance Needed: requires assist  for lower body ADLs at rehab, Independent with ADLs,  IADLs and yard work. Pt reports caring for wife at home            OT Problem List: Decreased activity tolerance;Decreased strength;Impaired balance (sitting and/or standing);Decreased knowledge of use of DME or AE;Cardiopulmonary status limiting activity;Decreased cognition;Decreased safety awareness      OT Treatment/Interventions: Self-care/ADL training;DME and/or AE instruction;Therapeutic activities;Patient/family education;Balance training;Energy conservation;Therapeutic exercise    OT Goals(Current goals can be found in the care plan section) Acute Rehab OT Goals Patient Stated Goal: be able to use my backpack blower, be how i was before OT Goal Formulation: With patient Time For Goal Achievement: 09/08/20 Potential to Achieve Goals: Good ADL Goals Pt Will Perform Grooming: with modified independence;standing Pt Will Perform Lower Body Bathing: with supervision;sitting/lateral leans;sit to/from stand Pt Will Perform Lower Body Dressing: with supervision;sitting/lateral leans;sit to/from stand Pt Will Transfer to Toilet: with modified independence;ambulating Additional ADL Goal #1: Pt to demonstrate at least 3 energy conservation strategies during ADLs/IADLs and mobility  OT Frequency: Min 2X/week   Barriers to D/C:            Co-evaluation              AM-PAC OT "6 Clicks" Daily Activity     Outcome Measure Help from another person eating meals?: None Help from another person taking care of personal grooming?: A Little Help from another person toileting, which includes using toliet, bedpan, or urinal?: A Lot Help from another person bathing (including washing, rinsing, drying)?: A Lot Help from another person to put on and taking off regular upper body clothing?: A Little Help from another person to put on and taking off regular lower body clothing?: A Lot 6 Click Score: 16   End of Session Equipment Utilized During Treatment: Gait belt;Rolling  walker;Oxygen Nurse Communication: Mobility status;Other (comment) (O2, condom cath malfunction)  Activity Tolerance: Patient tolerated treatment well Patient left: in bed;with call bell/phone within reach;with bed alarm set  OT Visit Diagnosis: Unsteadiness on feet (R26.81);Muscle weakness (generalized) (M62.81);Other symptoms and signs involving cognitive function;History of falling (Z91.81)                Time: 6578-4696 OT Time Calculation (min): 52 min Charges:  OT General Charges $OT Visit: 1 Visit OT Evaluation $OT Eval Moderate Complexity: 1 Mod OT Treatments $Self Care/Home Management : 23-37 mins  Malachy Chamber, OTR/L Acute Rehab Services Office: [phone]  Layla Maw 08/25/2020, 8:39 AM

## 2020-08-25 NOTE — Progress Notes (Signed)
PROGRESS NOTE                                                                                                                                                                                                             Patient Demographics:    Todd Lloyd, is a 80 y.o. male, DOB - 04/10/1941, ZOX:[id number]  Outpatient Primary MD for the patient is [name], [name], MD    LOS - 1  Admit date - 08/23/2020    Chief Complaint  Patient presents with  . Respiratory Distress  . Altered Mental Status       Brief Narrative (HPI from H&P)   Todd Lloyd is a 80 y.o. male with medical history significant for COPD, stage I non-small cell lung cancer s/p radiation in 04/2020, dementia, paroxysmal A. fib on Eliquis, GERD, leukemia s/p bone marrow transplantation in 1990 presents to the emergency department EMS, on hospital arrival he was hypoxic, hypercapnic was placed on BiPAP and admitted, note he had similar issues of becoming obtunded with high Co2 levels on recent hospitalizations but with relatively normal pH.   Subjective:    Todd Lloyd today, no chest pain, fever or chills, reports he did not wear BiPAP overnight as no one told him he needs it.   Assessment  & Plan :   Toxic encephalopathy  -Significantly improved, most likely due to both hypoxia and hypercarbia, possibly due to infection . -This has significantly improved, he is awake and alert appropriate today . -Avoid narcotics . Encouraged the patient to sit up in chair in the daytime use I-S and flutter valve for pulmonary toiletry.  Will advance activity and titrate down oxygen as possible.   Acute hypoxic on chronic hypercarbic respiratory failure -Secondary to what looks right lower lobe pneumonia, and COPD exacerbation -Continue with Augmentin, continue with IV steroids -Patient will have BiPAP trial overnight  SpO2: 98 % O2 Flow Rate (L/min): 3  L/min FiO2 (%): 50 %  Recent Labs  Lab 08/19/20 0240 08/19/20 1231 08/20/20 0356 08/23/20 1834 08/23/20 1855 08/23/20 2057 08/24/20 0020 08/24/20 0945 08/25/20 0119  WBC 4.4  --  4.5 4.5  --   --   --  4.0 4.9  HGB 8.3*  --  8.7* 9.2* 9.9*  --  9.5* 8.5* 8.1*  HCT 27.9*  --  28.9*  30.6* 29.0*  --  28.0* 28.5* 27.2*  PLT 197  --  198 208  --   --   --  180 161  CRP  --   --   --   --   --   --   --  1.3* 2.3*  BNP  --   --   --   --   --   --   --  193.1* 272.0*  DDIMER  --   --   --   --   --   --   --  1.15* 1.31*  PROCALCITON  --   --   --   --   --   --   --  0.10 <0.10  AST  --   --   --   --   --   --   --  22 24  ALT  --   --   --   --   --   --   --  17 17  ALKPHOS  --   --   --   --   --   --   --  88 88  BILITOT  --   --   --   --   --   --   --  0.5 0.5  ALBUMIN  --   --   --   --   --   --   --  2.9* 2.9*  INR  --   --   --   --   --   --   --   --  1.2  SARSCOV2NAA  --  NEGATIVE  --   --   --  NEGATIVE  --   --   --        ABG     Component Value Date/Time   PHART 7.419 08/24/2020 0020   PCO2ART 78.7 (HH) 08/24/2020 0020   PO2ART 60 (L) 08/24/2020 0020   HCO3 51.0 (H) 08/24/2020 0020   TCO2 >50 (H) 08/24/2020 0020   ACIDBASEDEF 3.0 (H) 07/27/2020 0950   O2SAT 90.0 08/24/2020 0020      Right lung base pneumonia  -continue with Augmentin  History of dementia at risk for delirium.  Home Aricept and memantine continued.  At risk for delirium.  Chronic A. fib [place] vascular score of greater than 3.  Continue Digoxin and Eliquis combination.  Contraction alkalosis with hypotension.  Gentle hydration with IV fluids.  Hold beta-blocker.  Hypomagnesemia.  Replace.  Recent history of GI bleed likely due to cecal ischemic insult.  On Eliquis monitor closely.  Has been seen by [facility] recently.  GERD.  On PPI.      Condition - Extremely Guarded  Family Communication  :  None at bedside  Code Status : DNR  Consults  :  PCCM, [place]  PUD  Prophylaxis : PPI   Procedures  :     CT Head - non acute      Disposition Plan  :    Status is: Observation  Dispo: The patient is from: Home              Anticipated d/c is to: Home              Patient currently is not medically stable to d/c.   Difficult to place patient No   DVT Prophylaxis  : Eliquis  Lab Results  Component Value Date   PLT 161 08/25/2020  Diet :  Diet Order            DIET SOFT Room service appropriate? Yes; Fluid consistency: Thin  Diet effective now                  Inpatient Medications  Scheduled Meds: . amoxicillin-clavulanate  1 tablet Oral Q12H  . apixaban  5 mg Oral BID  . arformoterol  15 mcg Nebulization BID  . budesonide (PULMICORT) nebulizer solution  0.25 mg Nebulization Q6H  . digoxin  0.25 mg Oral q morning  . donepezil  10 mg Oral QHS  . memantine  5 mg Oral BID  . methylPREDNISolone (SOLU-MEDROL) injection  60 mg Intravenous Q12H  . pantoprazole  40 mg Oral Q0600  . revefenacin  175 mcg Nebulization Daily   Continuous Infusions:  PRN Meds:.  Antibiotics  :    Anti-infectives (From admission, onward)   Start     Dose/Rate Route Frequency Ordered Stop   08/25/20 0000  azithromycin (ZITHROMAX) 500 mg in sodium chloride 0.9 % 250 mL IVPB  Status:  Discontinued        500 mg 250 mL/hr over 60 Minutes Intravenous Every 24 hours 08/24/20 0643 08/24/20 1215   08/24/20 2200  cefTRIAXone (ROCEPHIN) 1 g in sodium chloride 0.9 % 100 mL IVPB  Status:  Discontinued        1 g 200 mL/hr over 30 Minutes Intravenous Every 24 hours 08/24/20 0643 08/24/20 1215   08/24/20 1315  amoxicillin-clavulanate (AUGMENTIN) 875-125 MG per tablet 1 tablet       Note to Pharmacy: Pharmacy can adjust for aspiration pneumonia   1 tablet Oral Every 12 hours 08/24/20 1215     08/24/20 0000  cefTRIAXone (ROCEPHIN) 1 g in sodium chloride 0.9 % 100 mL IVPB        1 g 200 mL/hr over 30 Minutes Intravenous  Once 08/23/20 2349 08/24/20 0159    08/24/20 0000  azithromycin (ZITHROMAX) 500 mg in sodium chloride 0.9 % 250 mL IVPB        500 mg 250 mL/hr over 60 Minutes Intravenous  Once 08/23/20 2349 08/24/20 0400       Time Spent in minutes  30   Phillips Climes M.D on 08/25/2020 at 2:59 PM  To page go to [email or web address]   [facility] -  Office  [phone]     See all Orders from today for further details    Objective:   Vitals:   08/24/20 1935 08/25/20 0030 08/25/20 0416 08/25/20 0834  BP: (!) 104/53 (!) 106/54 121/70   Pulse: 94 79 82   Resp: (!) 25 (!) 22 16   Temp: 98 F (36.7 C) 98.1 F (36.7 C) 97.9 F (36.6 C)   TempSrc: Oral Oral Oral   SpO2: 92% 98% 98% 98%  Weight:      Height:        Wt Readings from Last 3 Encounters:  08/24/20 84.5 kg  08/20/20 87.8 kg  04/22/20 93.6 kg     Intake/Output Summary (Last 24 hours) at 08/25/2020 1459 Last data filed at 08/25/2020 0400 Gross per 24 hour  Intake 920 ml  Output --  Net 920 ml     Physical Exam  Awake Alert, Oriented X 3, No new F.N deficits, Normal affect Symmetrical Chest wall movement, Good air movement bilaterally, scattered Rales RRR,No Gallops,Rubs or new Murmurs, No Parasternal Heave +ve B.Sounds, Abd Soft, No tenderness, No rebound - guarding or rigidity. No Cyanosis,  Clubbing or edema, No new Rash or bruise       Data Review:    CBC Recent Labs  Lab 08/19/20 0240 08/20/20 0356 08/23/20 1834 08/23/20 1855 08/24/20 0020 08/24/20 0945 08/25/20 0119  WBC 4.4 4.5 4.5  --   --  4.0 4.9  HGB 8.3* 8.7* 9.2* 9.9* 9.5* 8.5* 8.1*  HCT 27.9* 28.9* 30.6* 29.0* 28.0* 28.5* 27.2*  PLT 197 198 208  --   --  180 161  MCV 99.3 97.6 101.3*  --   --  100.0 100.4*  MCH 29.5 29.4 30.5  --   --  29.8 29.9  MCHC 29.7* 30.1 30.1  --   --  29.8* 29.8*  RDW 18.8* 18.9* 20.6*  --   --  20.8* 21.1*  LYMPHSABS  --   --  0.8  --   --   --  0.6*  MONOABS  --   --  0.9  --   --   --  0.3  EOSABS  --   --  0.1  --   --   --  0.1  BASOSABS   --   --  0.1  --   --   --  0.0    Recent Labs  Lab 08/19/20 0240 08/20/20 0356 08/23/20 1855 08/23/20 2002 08/24/20 0020 08/24/20 0945 08/25/20 0119  NA 142 142 141 137 142 145 139  K 3.6 4.1 3.3* 4.6 3.3* 3.5 4.1  CL 92* 90*  --  88*  --  91* 89*  CO2 45* 48*  --  39*  --  49* 46*  GLUCOSE 108* 99  --  80  --  89 120*  BUN 13 13  --  22  --  17 20  CREATININE 0.93 0.95  --  1.10  --  1.06 1.13  CALCIUM 8.6* 9.1  --  8.6*  --  8.7* 8.6*  AST  --   --   --   --   --  22 24  ALT  --   --   --   --   --  17 17  ALKPHOS  --   --   --   --   --  88 88  BILITOT  --   --   --   --   --  0.5 0.5  ALBUMIN  --   --   --   --   --  2.9* 2.9*  MG 1.4* 1.7  --   --   --  1.3* 2.4  CRP  --   --   --   --   --  1.3* 2.3*  DDIMER  --   --   --   --   --  1.15* 1.31*  PROCALCITON  --   --   --   --   --  0.10 <0.10  INR  --   --   --   --   --   --  1.2  BNP  --   --   --   --   --  193.1* 272.0*    ------------------------------------------------------------------------------------------------------------------ No results for input(s): CHOL, HDL, LDLCALC, TRIG, CHOLHDL, LDLDIRECT in the last 72 hours.  No results found for: HGBA1C ------------------------------------------------------------------------------------------------------------------ No results for input(s): TSH, T4TOTAL, T3FREE, THYROIDAB in the last 72 hours.  Invalid input(s): FREET3  Cardiac Enzymes No results for input(s): CKMB, TROPONINI, MYOGLOBIN in the last 168 hours.  Invalid input(s): CK ------------------------------------------------------------------------------------------------------------------  Component Value Date/Time   BNP 272.0 (H) 08/25/2020 0119    Micro Results Recent Results (from the past 240 hour(s))  Resp Panel by RT-PCR (Flu A&B, Covid) Nasopharyngeal Swab     Status: None   Collection Time: 08/19/20 12:31 PM   Specimen: Nasopharyngeal Swab; Nasopharyngeal(NP) swabs in vial transport  medium  Result Value Ref Range Status   SARS Coronavirus 2 by RT PCR NEGATIVE NEGATIVE Final    Comment: (NOTE) SARS-CoV-2 target nucleic acids are NOT DETECTED.  The SARS-CoV-2 RNA is generally detectable in upper respiratory specimens during the acute phase of infection. The lowest concentration of SARS-CoV-2 viral copies this assay can detect is 138 copies/mL. A negative result does not preclude SARS-Cov-2 infection and should not be used as the sole basis for treatment or other patient management decisions. A negative result may occur with  improper specimen collection/handling, submission of specimen other than nasopharyngeal swab, presence of viral mutation(s) within the areas targeted by this assay, and inadequate number of viral copies(<138 copies/mL). A negative result must be combined with clinical observations, patient history, and epidemiological information. The expected result is Negative.  Fact Sheet for Patients:  EntrepreneurPulse.com.au  Fact Sheet for Healthcare Providers:  IncredibleEmployment.be  This test is no t yet approved or cleared by the [place] FDA and  has been authorized for detection and/or diagnosis of SARS-CoV-2 by FDA under an Emergency Use Authorization (EUA). This EUA will remain  in effect (meaning this test can be used) for the duration of the COVID-19 declaration under Section 564(b)(1) of the Act, 21 U.S.C.section 360bbb-3(b)(1), unless the authorization is terminated  or revoked sooner.       Influenza A by PCR NEGATIVE NEGATIVE Final   Influenza B by PCR NEGATIVE NEGATIVE Final    Comment: (NOTE) The Xpert Xpress SARS-CoV-2/FLU/RSV plus assay is intended as an aid in the diagnosis of influenza from Nasopharyngeal swab specimens and should not be used as a sole basis for treatment. Nasal washings and aspirates are unacceptable for Xpert Xpress SARS-CoV-2/FLU/RSV testing.  Fact Sheet for  Patients: EntrepreneurPulse.com.au  Fact Sheet for Healthcare Providers: IncredibleEmployment.be  This test is not yet approved or cleared by the [place] FDA and has been authorized for detection and/or diagnosis of SARS-CoV-2 by FDA under an Emergency Use Authorization (EUA). This EUA will remain in effect (meaning this test can be used) for the duration of the COVID-19 declaration under Section 564(b)(1) of the Act, 21 U.S.C. section 360bbb-3(b)(1), unless the authorization is terminated or revoked.  Performed at [facility], [place] [address], [place], [place] [address]   Resp Panel by RT-PCR (Flu A&B, Covid) Nasopharyngeal Swab     Status: None   Collection Time: 08/23/20  8:57 PM   Specimen: Nasopharyngeal Swab; Nasopharyngeal(NP) swabs in vial transport medium  Result Value Ref Range Status   SARS Coronavirus 2 by RT PCR NEGATIVE NEGATIVE Final    Comment: (NOTE) SARS-CoV-2 target nucleic acids are NOT DETECTED.  The SARS-CoV-2 RNA is generally detectable in upper respiratory specimens during the acute phase of infection. The lowest concentration of SARS-CoV-2 viral copies this assay can detect is 138 copies/mL. A negative result does not preclude SARS-Cov-2 infection and should not be used as the sole basis for treatment or other patient management decisions. A negative result may occur with  improper specimen collection/handling, submission of specimen other than nasopharyngeal swab, presence of viral mutation(s) within the areas targeted by this assay, and inadequate number of viral  copies(<138 copies/mL). A negative result must be combined with clinical observations, patient history, and epidemiological information. The expected result is Negative.  Fact Sheet for Patients:  EntrepreneurPulse.com.au  Fact Sheet for Healthcare Providers:  IncredibleEmployment.be  This test is no t yet  approved or cleared by the [place] FDA and  has been authorized for detection and/or diagnosis of SARS-CoV-2 by FDA under an Emergency Use Authorization (EUA). This EUA will remain  in effect (meaning this test can be used) for the duration of the COVID-19 declaration under Section 564(b)(1) of the Act, 21 U.S.C.section 360bbb-3(b)(1), unless the authorization is terminated  or revoked sooner.       Influenza A by PCR NEGATIVE NEGATIVE Final   Influenza B by PCR NEGATIVE NEGATIVE Final    Comment: (NOTE) The Xpert Xpress SARS-CoV-2/FLU/RSV plus assay is intended as an aid in the diagnosis of influenza from Nasopharyngeal swab specimens and should not be used as a sole basis for treatment. Nasal washings and aspirates are unacceptable for Xpert Xpress SARS-CoV-2/FLU/RSV testing.  Fact Sheet for Patients: EntrepreneurPulse.com.au  Fact Sheet for Healthcare Providers: IncredibleEmployment.be  This test is not yet approved or cleared by the [place] FDA and has been authorized for detection and/or diagnosis of SARS-CoV-2 by FDA under an Emergency Use Authorization (EUA). This EUA will remain in effect (meaning this test can be used) for the duration of the COVID-19 declaration under Section 564(b)(1) of the Act, 21 U.S.C. section 360bbb-3(b)(1), unless the authorization is terminated or revoked.  Performed at [facility], [place] [address] [place], [place] [address]   MRSA PCR Screening     Status: None   Collection Time: 08/24/20 11:01 AM   Specimen: Urine, Clean Catch; Nasopharyngeal  Result Value Ref Range Status   MRSA by PCR NEGATIVE NEGATIVE Final    Comment:        The GeneXpert MRSA Assay (FDA approved for NASAL specimens only), is one component of a comprehensive MRSA colonization surveillance program. It is not intended to diagnose MRSA infection nor to guide or monitor treatment for MRSA infections. Performed  at [name], [place] [address], [place], [place] [address]     Radiology Reports CT ABDOMEN PELVIS WO CONTRAST  Result Date: 08/10/2020 CLINICAL DATA:  Anemia, colitis. EXAM: CT ABDOMEN AND PELVIS WITHOUT CONTRAST TECHNIQUE: Multidetector CT imaging of the abdomen and pelvis was performed following the standard protocol without IV contrast. COMPARISON:  July 26, 2020. FINDINGS: Lower chest: Bilateral pleural effusions are noted with adjacent subsegmental atelectasis. Hepatobiliary: No focal liver abnormality is seen. No gallstones, gallbladder wall thickening, or biliary dilatation. Pancreas: Unremarkable. No pancreatic ductal dilatation or surrounding inflammatory changes. Spleen: Normal in size without focal abnormality. Adrenals/Urinary Tract: Stable large calcification involving left adrenal gland consistent with old hematoma. Right adrenal gland appears normal. Small nonobstructive left renal calculus is noted. No hydronephrosis or renal obstruction is noted. Urinary bladder is decompressed secondary to Foley catheter. Stomach/Bowel: The stomach appears normal. There is no evidence of bowel obstruction. Status post appendectomy. There remains wall thickening involving the proximal os ascending colon, but it appears to be significantly improved compared to prior exam. Vascular/Lymphatic: Aortic atherosclerosis. No enlarged abdominal or pelvic lymph nodes. Reproductive: Prostate is unremarkable. Other: Mild anasarca is noted.  No ascites or hernia is noted. Musculoskeletal: No acute or significant osseous findings. IMPRESSION: 1. Bilateral pleural effusions are noted with adjacent subsegmental atelectasis. 2. Stable large calcification involving left adrenal gland consistent with old hematoma.  3. Small nonobstructive left renal calculus. No hydronephrosis or renal obstruction is noted. 4. There remains wall thickening involving the proximal ascending colon, but it appears to be significantly  improved compared to prior exam. 5. Mild anasarca is noted. 6. Aortic atherosclerosis. Aortic Atherosclerosis (ICD10-I70.0). Electronically Signed   By: Marijo Conception M.D.   On: 08/10/2020 19:25   CT HEAD WO CONTRAST  Result Date: 08/24/2020 CLINICAL DATA:  Delirium, acute metabolic encephalopathy EXAM: CT HEAD WITHOUT CONTRAST TECHNIQUE: Contiguous axial images were obtained from the base of the skull through the vertex without intravenous contrast. COMPARISON:  Recent prior CT scan of the head 08/05/2020 FINDINGS: Brain: No evidence of acute infarction, hemorrhage, hydrocephalus, extra-axial collection or mass lesion/mass effect. Stable mild cerebral and cerebellar cortical atrophy. Trace chronic microvascular ischemic white matter changes. Vascular: No hyperdense vessel or unexpected calcification. Skull: Normal. Negative for fracture or focal lesion. Sinuses/Orbits: No acute finding.  Bilateral scleral banding. Other: None. IMPRESSION: 1. No acute intracranial abnormality. 2. Stable mild atrophy and chronic microvascular ischemic white matter changes. Electronically Signed   By: Jacqulynn Cadet M.D.   On: 08/24/2020 11:48   CT HEAD WO CONTRAST  Result Date: 08/05/2020 CLINICAL DATA:  Expressive aphasia with stroke suspected EXAM: CT HEAD WITHOUT CONTRAST TECHNIQUE: Contiguous axial images were obtained from the base of the skull through the vertex without intravenous contrast. COMPARISON:  Ten days ago FINDINGS: Brain: No evidence of acute infarction, hemorrhage, hydrocephalus, extra-axial collection or mass lesion/mass effect. Generalized atrophy. Age normal appearance of white matter. Vascular: No hyperdense vessel or unexpected calcification. Skull: Normal. Negative for fracture or focal lesion. Sinuses/Orbits: No acute finding. Bilateral scleral banding and cataract resection. IMPRESSION: Stable head CT. No visible infarct or other change from recent comparison. Electronically Signed   By:  Monte Fantasia M.D.   On: 08/05/2020 05:01   CT HEAD WO CONTRAST  Result Date: 07/26/2020 CLINICAL DATA:  Trauma. Unwitnessed fall. EXAM: CT HEAD WITHOUT CONTRAST CT CERVICAL SPINE WITHOUT CONTRAST TECHNIQUE: Multidetector CT imaging of the head and cervical spine was performed following the standard protocol without intravenous contrast. Multiplanar CT image reconstructions of the cervical spine were also generated. COMPARISON:  CT cervical spine August 30, 2011. FINDINGS: CT HEAD FINDINGS Brain: No evidence of acute large vascular territory infarction, hemorrhage, hydrocephalus, extra-axial collection or mass lesion/mass effect. Mild for age scattered white matter hypodensities, most likely related to chronic microvascular ischemic disease. Mild generalized cerebral atrophy with ex vacuo ventricular dilation. Vascular: Calcific atherosclerosis. No hyperdense vessel identified. Skull: No acute fracture.  Left frontal scalp contusion Sinuses/Orbits: Visualized sinuses are clear. Bilateral scleral buckles. Other: No mastoid effusions. CT CERVICAL SPINE FINDINGS Alignment: Mild anterolisthesis of C4 on C5, favor degenerative given facet arthropathy at this level. Broad dextrocurvature of the cervical spine. Skull base and vertebrae: Vertebral body heights are maintained. No evidence of acute fracture. Soft tissues and spinal canal: No prevertebral fluid or swelling. No visible canal hematoma. Disc levels: Left eccentric degenerative disc disease in the lower cervical spine, greatest at C7-T1 with disc height loss and endplate sclerosis. Bulky facet hypertrophy at multiple levels, greatest on the left at C4-C5 where there is likely at least moderate bony foraminal stenosis. Upper chest: Emphysema. Lung apices appear clear although evaluation is limited by motion. IMPRESSION: CT head: 1. No evidence of acute intracranial abnormality. 2. Left frontal scalp contusion without acute fracture. CT cervical spine: 1. No  evidence of acute fracture. 2. Rotation of C1 on C2 is  likely positional in the absence of a fixed torticollis. 3. Bulky facet hypertrophy at multiple levels, greatest on the left at C4-C5 where there is likely at least moderate bony foraminal stenosis. 4. Left eccentric degenerative disc disease in the lower cervical spine, greatest at C7-T1. Electronically Signed   By: Margaretha Sheffield MD   On: 07/26/2020 17:59   CT CERVICAL SPINE WO CONTRAST  Result Date: 07/26/2020 CLINICAL DATA:  Trauma. Unwitnessed fall. EXAM: CT HEAD WITHOUT CONTRAST CT CERVICAL SPINE WITHOUT CONTRAST TECHNIQUE: Multidetector CT imaging of the head and cervical spine was performed following the standard protocol without intravenous contrast. Multiplanar CT image reconstructions of the cervical spine were also generated. COMPARISON:  CT cervical spine August 30, 2011. FINDINGS: CT HEAD FINDINGS Brain: No evidence of acute large vascular territory infarction, hemorrhage, hydrocephalus, extra-axial collection or mass lesion/mass effect. Mild for age scattered white matter hypodensities, most likely related to chronic microvascular ischemic disease. Mild generalized cerebral atrophy with ex vacuo ventricular dilation. Vascular: Calcific atherosclerosis. No hyperdense vessel identified. Skull: No acute fracture.  Left frontal scalp contusion Sinuses/Orbits: Visualized sinuses are clear. Bilateral scleral buckles. Other: No mastoid effusions. CT CERVICAL SPINE FINDINGS Alignment: Mild anterolisthesis of C4 on C5, favor degenerative given facet arthropathy at this level. Broad dextrocurvature of the cervical spine. Skull base and vertebrae: Vertebral body heights are maintained. No evidence of acute fracture. Soft tissues and spinal canal: No prevertebral fluid or swelling. No visible canal hematoma. Disc levels: Left eccentric degenerative disc disease in the lower cervical spine, greatest at C7-T1 with disc height loss and endplate sclerosis.  Bulky facet hypertrophy at multiple levels, greatest on the left at C4-C5 where there is likely at least moderate bony foraminal stenosis. Upper chest: Emphysema. Lung apices appear clear although evaluation is limited by motion. IMPRESSION: CT head: 1. No evidence of acute intracranial abnormality. 2. Left frontal scalp contusion without acute fracture. CT cervical spine: 1. No evidence of acute fracture. 2. Rotation of C1 on C2 is likely positional in the absence of a fixed torticollis. 3. Bulky facet hypertrophy at multiple levels, greatest on the left at C4-C5 where there is likely at least moderate bony foraminal stenosis. 4. Left eccentric degenerative disc disease in the lower cervical spine, greatest at C7-T1. Electronically Signed   By: Margaretha Sheffield MD   On: 07/26/2020 17:59   US RENAL  Result Date: 07/28/2020 CLINICAL DATA:  Acute kidney injury EXAM: RENAL / URINARY TRACT ULTRASOUND COMPLETE COMPARISON:  None. FINDINGS: Right Kidney: Renal measurements: 10 x 6.2 x 5.7 cm = volume: 184.4 mL. Echogenicity within normal limits. No mass or hydronephrosis visualized. Left Kidney: Renal measurements: 10.2 x 6.6 x 6 cm = volume: 211.7 mL. Echogenicity within normal limits. 1.8 cm cyst at the upper pole. No hydronephrosis visualized. Bladder: Decompressed and not visualized Other: None. IMPRESSION: No hydronephrosis. Electronically Signed   By: Macy Mis M.D.   On: 07/28/2020 16:23   DG Pelvis Portable  Result Date: 07/26/2020 CLINICAL DATA:  Recent fall with pelvic pain, initial encounter EXAM: PORTABLE PELVIS 1-2 VIEWS COMPARISON:  02/24/2020 FINDINGS: Pelvic ring is intact. No acute fracture or dislocation is noted. Postsurgical changes are seen. No soft tissue abnormality is noted. Degenerative changes of lumbar spine are noted as well. Left upper quadrant calcification is noted similar to that seen on prior PET-CT. IMPRESSION: No acute abnormality noted. Electronically Signed   By: Inez Catalina M.D.   On: 07/26/2020 17:55   DG Chest Port 1  View  Result Date: 08/24/2020 CLINICAL DATA:  Shortness of breath EXAM: PORTABLE CHEST 1 VIEW COMPARISON:  August 23, 2020 FINDINGS: Ill-defined airspace opacity right base is stable. Visualized lung otherwise clear. Note that a small portion of the lateral left base is not imaged. Heart size and pulmonary vascular normal. No adenopathy. There is aortic atherosclerosis. No bone lesions. IMPRESSION: Ill-defined opacity right base which may represent early developing pneumonia. Appearance stable compared to 1 day prior. Lungs elsewhere clear. Heart size normal. Aortic Atherosclerosis (ICD10-I70.0). Electronically Signed   By: Lowella Grip III M.D.   On: 08/24/2020 08:08   DG Chest Port 1 View  Result Date: 08/23/2020 CLINICAL DATA:  80 year old male with history of dyspnea and altered mental status. EXAM: PORTABLE CHEST 1 VIEW COMPARISON:  Chest x-ray 07/30/2020. FINDINGS: Lung volumes are low. Diffuse peribronchial cuffing and scattered areas of ill-defined interstitial prominence noted throughout the lungs bilaterally, most evident in the medial aspect of the right upper lobe and medial aspect of the lower right lung. Trace right pleural effusion. No left pleural effusion. No pneumothorax. No evidence of pulmonary edema. Mild cardiomegaly. Upper mediastinal contours are within normal limits. Aortic atherosclerosis. IMPRESSION: 1. The appearance of the chest is concerning for bronchitis with developing multilobar bronchopneumonia. 2. Trace right pleural effusion. 3. Mild cardiomegaly. 4. Aortic atherosclerosis. Electronically Signed   By: Vinnie Langton M.D.   On: 08/23/2020 19:35   DG Chest Port 1 View  Result Date: 07/30/2020 CLINICAL DATA:  Respiratory failure EXAM: PORTABLE CHEST 1 VIEW COMPARISON:  07/26/2020 FINDINGS: The lungs are symmetrically well inflated. Minimal left basilar atelectasis. Mild coarsening of the pulmonary interstitium is  again noted. No pneumothorax or pleural effusion. Cardiac size is mildly enlarged. Pulmonary vasculature is normal. No acute bone abnormality. IMPRESSION: No active disease.  Stable cardiomegaly. Electronically Signed   By: Fidela Salisbury MD   On: 07/30/2020 07:04   DG Chest Port 1 View  Result Date: 07/26/2020 CLINICAL DATA:  Recent fall EXAM: PORTABLE CHEST 1 VIEW COMPARISON:  07/06/2020 FINDINGS: Cardiac shadow is mildly enlarged but stable. Aortic calcifications are seen. Known right lower lobe mass lesion is not well appreciated on this exam. No infiltrate or sizable effusion is seen. No acute bony abnormality is noted. IMPRESSION: No acute abnormality noted. Electronically Signed   By: Inez Catalina M.D.   On: 07/26/2020 17:57   EEG adult  Result Date: 08/25/2020 Lora Havens, MD     08/25/2020  8:14 AM Patient Name: Todd Lloyd MRN: [id number] Epilepsy Attending: Lora Havens Referring Physician/Provider: Dr Lala Lund Date: 08/24/2020 Duration: 23.17 mins Patient history: 80 year old male with altered mental status. EEG to evaluate for seizures. Level of alertness: Awake AEDs during EEG study: None Technical aspects: This EEG study was done with scalp electrodes positioned according to the 10-20 International system of electrode placement. Electrical activity was acquired at a sampling rate of 500Hz  and reviewed with a high frequency filter of 70Hz  and a low frequency filter of 1Hz . EEG data were recorded continuously and digitally stored. Description: No posterior dominant rhythm was seen. EEG showed continuous generalized polymorphic mixed frequencies with predominantly 3 to 6 Hz theta-delta slowing. Hyperventilation and photic stimulation were not performed.   ABNORMALITY - Continuous slow, generalized IMPRESSION: This study is suggestive of moderate diffuse encephalopathy, nonspecific etiology. No seizures or epileptiform discharges were seen throughout the recording. Lora Havens    EEG adult  Result Date: 07/27/2020 Lora Havens, MD  07/27/2020 10:35 AM Patient Name: DREUX MCGROARTY MRN: [id number] Epilepsy Attending: Lora Havens Referring Physician/Provider: Dr. Kerney Elbe Date: 07/27/2020 Duration: 23.04 mins  Patient history: 80yo M with ams. EEG to evaluate for seizure  Level of alertness: Awake  AEDs during EEG study: None  Technical aspects: This EEG study was done with scalp electrodes positioned according to the 10-20 International system of electrode placement. Electrical activity was acquired at a sampling rate of 500Hz  and reviewed with a high frequency filter of 70Hz  and a low frequency filter of 1Hz . EEG data were recorded continuously and digitally stored.  Description: EEG showed continuous generalized 4.5-5Hz  theta slowing. Hyperventilation and photic stimulation were not performed.    ABNORMALITY -Continuous slow, generalized  IMPRESSION: This study is suggestive of moderate diffuse encephalopathy, nonspecific etiology. No seizures or epileptiform discharges were seen throughout the recording.   Priyanka O Yadav   VAS [place] LOWER EXTREMITY VENOUS (DVT)  Result Date: 08/09/2020  Lower Venous DVT Study Indications: Swelling, and Pain.  Comparison Study: No prior study Performing Technologist: Sharion Dove RVS  Examination Guidelines: A complete evaluation includes B-mode imaging, spectral Doppler, color Doppler, and power Doppler as needed of all accessible portions of each vessel. Bilateral testing is considered an integral part of a complete examination. Limited examinations for reoccurring indications may be performed as noted. The reflux portion of the exam is performed with the patient in reverse Trendelenburg.  +-----+---------------+---------+-----------+----------+--------------+ RIGHTCompressibilityPhasicitySpontaneityPropertiesThrombus Aging +-----+---------------+---------+-----------+----------+--------------+ CFV  Full            Yes      Yes                                 +-----+---------------+---------+-----------+----------+--------------+   +---------+---------------+---------+-----------+----------+--------------+ LEFT     CompressibilityPhasicitySpontaneityPropertiesThrombus Aging +---------+---------------+---------+-----------+----------+--------------+ CFV      Full           Yes      Yes                                 +---------+---------------+---------+-----------+----------+--------------+ SFJ      Full                                                        +---------+---------------+---------+-----------+----------+--------------+ FV Prox  Full                                                        +---------+---------------+---------+-----------+----------+--------------+ FV Mid   Full                                                        +---------+---------------+---------+-----------+----------+--------------+ FV DistalFull                                                        +---------+---------------+---------+-----------+----------+--------------+  PFV      Full                                                        +---------+---------------+---------+-----------+----------+--------------+ POP      Full           Yes      Yes                                 +---------+---------------+---------+-----------+----------+--------------+ PTV      Full                                                        +---------+---------------+---------+-----------+----------+--------------+ PERO     Full                                                        +---------+---------------+---------+-----------+----------+--------------+     Summary: RIGHT: - No evidence of common femoral vein obstruction.  LEFT: - There is no evidence of deep vein thrombosis in the lower extremity.  *See table(s) above for measurements and observations. Electronically signed by  Servando Snare MD on 08/09/2020 at 11:19:19 AM.    Final    ECHOCARDIOGRAM LIMITED  Result Date: 07/27/2020    ECHOCARDIOGRAM LIMITED REPORT   Patient Name:   Todd Lloyd Date of Exam: 07/27/2020 Medical Rec #:  [id number]          Height:       72.0 in Accession #:    [id number]         Weight:       214.5 lb Date of Birth:  1940/12/26          BSA:          2.195 m Patient Age:    42 years           BP:           105/57 mmHg Patient Gender: M                  HR:           77 bpm. Exam Location:  Inpatient Procedure: Limited Echo, Cardiac Doppler and Color Doppler Indications:    Congestive Heart Failure I50.9  History:        Patient has prior history of Echocardiogram examinations, most                 recent 03/03/2020. COPD, Arrythmias:Atrial Fibrillation; Risk                 Factors:Hypertension, Dyslipidemia and Former Smoker.  Sonographer:    Vickie Epley RDCS Referring Phys: [place]  1. Left ventricular ejection fraction, by estimation, is 55 to 60%. The left ventricle has normal function. Left ventricular diastolic function could not be evaluated.  2. The mitral valve is grossly normal. No evidence of mitral stenosis.  3. Tricuspid valve regurgitation  is mild to moderate.  4. The aortic valve is grossly normal. Aortic valve regurgitation is trivial. No aortic stenosis is present.  5. There is moderately elevated pulmonary artery systolic pressure. FINDINGS  Left Ventricle: Left ventricular ejection fraction, by estimation, is 55 to 60%. The left ventricle has normal function. Left ventricular diastolic function could not be evaluated. Left ventricular diastolic function could not be evaluated due to atrial  fibrillation. Right Ventricle: There is moderately elevated pulmonary artery systolic pressure. The tricuspid regurgitant velocity is 2.77 m/s, and with an assumed right atrial pressure of 15 mmHg, the estimated right ventricular systolic pressure is 53.6 mmHg. Mitral  Valve: The mitral valve is grossly normal. No evidence of mitral valve stenosis. Tricuspid Valve: The tricuspid valve is normal in structure. Tricuspid valve regurgitation is mild to moderate. Aortic Valve: The aortic valve is grossly normal. Aortic valve regurgitation is trivial. No aortic stenosis is present. Pulmonic Valve: The pulmonic valve was normal in structure. Pulmonic valve regurgitation is trivial. Aorta: The aortic root and ascending aorta are structurally normal, with no evidence of dilitation. LEFT VENTRICLE PLAX 2D LVIDd:         5.00 cm LVIDs:         3.80 cm LV PW:         1.00 cm LV IVS:        1.00 cm LVOT diam:     2.40 cm LVOT Area:     4.52 cm  LV Volumes (MOD) LV vol d, MOD A2C: 112.0 ml LV vol d, MOD A4C: 101.0 ml LV vol s, MOD A2C: 56.8 ml LV vol s, MOD A4C: 55.4 ml LV SV MOD A2C:     55.2 ml LV SV MOD A4C:     101.0 ml LV SV MOD BP:      49.6 ml RIGHT VENTRICLE TAPSE (M-mode): 1.5 cm LEFT ATRIUM         Index LA diam:    5.10 cm 2.32 cm/m   AORTA Ao Root diam: 3.80 cm Ao Asc diam:  3.60 cm TRICUSPID VALVE TR Peak grad:   30.7 mmHg TR Vmax:        277.00 cm/s  SHUNTS Systemic Diam: 2.40 cm Mertie Moores MD Electronically signed by Mertie Moores MD Signature Date/Time: 07/27/2020/3:32:59 PM    Final    CT CHEST ABDOMEN PELVIS WO CONTRAST  Result Date: 07/26/2020 CLINICAL DATA:  Sepsis and leukocytosis, recent fall, history of lung carcinoma EXAM: CT CHEST, ABDOMEN AND PELVIS WITHOUT CONTRAST TECHNIQUE: Multidetector CT imaging of the chest, abdomen and pelvis was performed following the standard protocol without IV contrast. COMPARISON:  Chest x-ray and pelvis film from earlier in the same day., 07/06/2020 FINDINGS: CT CHEST FINDINGS Cardiovascular: Atherosclerotic calcifications of the aorta are noted without aneurysmal dilatation. No cardiac enlargement is seen. No pericardial effusion is noted. 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 Mediastinum/Nodes: Thoracic inlet is within normal  limits. No sizable hilar or mediastinal adenopathy is noted. The esophagus as visualized is within normal limits. Lungs/Pleura: Left lung is well aerated with mild emphysematous changes. No focal infiltrate or sizable effusion is seen. Previously seen bilobed right lower lobe mass lesion is again identified and stable. Small right-sided pleural effusion is noted stable from the prior exam. Musculoskeletal: Degenerative changes of the thoracic spine are noted. No acute rib abnormality is seen. CT ABDOMEN PELVIS FINDINGS Hepatobiliary: No focal liver abnormality is seen. No gallstones, gallbladder wall thickening, or biliary dilatation. Pancreas: Unremarkable. No pancreatic ductal  dilatation or surrounding inflammatory changes. Spleen: Normal in size without focal abnormality. Adrenals/Urinary Tract: Adrenal glands again demonstrate calcification on the left consistent with prior adrenal hematoma. The overall appearance is stable. Right adrenal gland is within normal limits. Kidneys are well visualized bilaterally. Tiny nonobstructing left renal stones are noted. Ureters are within normal limits. Bladder is decompressed by Foley catheter. No obstructive changes are seen. Stomach/Bowel: Colon is well visualized with some fluid within. Considerable wall thickening is noted within the ascending colon new from prior PET-CT from 02/24/2020. These changes are consistent with focal colitis without evidence of perforation. Some pericolonic inflammatory changes are seen. Small bowel and stomach appear within normal limits. The appendix is not well visualized. Vascular/Lymphatic: Aortic atherosclerosis. No enlarged abdominal or pelvic lymph nodes. Reproductive: Prostate is unremarkable. Other: No abdominal wall hernia or abnormality. No abdominopelvic ascites. Musculoskeletal: No acute or significant osseous findings. IMPRESSION: CT of the chest: Stable right lower lobe mass lesion with associated right effusion similar to that  seen on the prior exam. CT of the abdomen and pelvis: Tiny nonobstructing left renal stone. Changes of ascending colitis without evidence of perforation or abscess formation. Stable calcified left adrenal hematoma. Aortic Atherosclerosis (ICD10-I70.0) and Emphysema (ICD10-J43.9). Electronically Signed   By: Inez Catalina M.D.   On: 07/26/2020 23:08

## 2020-08-25 NOTE — TOC Initial Note (Signed)
Transition of Care [facility] - Initial/Assessment Note    Patient Details  Name: Todd Lloyd MRN: [id number] Date of Birth: 04-28-1941  [facility] CM/SW Contact:    Glennon Hamilton, [place] Work Phone Number: 08/25/2020, 3:50 PM  Clinical Narrative:          CSW intern received consult for possible SNF placement at time of discharge. CSW intern spoke with patient's daughter [name] due to patients altered mental status. 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 Patient's daughter expressed that the patient is still a little confused and believes it to be 1980. She expressed understanding that patient may need SNF at time of discharge. Patient's daughter reports that she prefers for him to go back to [place] as they are familiar with it. However, she voiced concerns about [place] being able to accommodate a BiPap if he were to need that. CSW intern will follow up with this. Patient has received the COVID vaccines. No further questions reported at this time.            Expected Discharge Plan: Skilled Nursing Facility Barriers to Discharge: Continued Medical Work up   Patient Goals and CMS Choice Patient states their goals for this hospitalization and ongoing recovery are:: to feel better CMS Medicare.gov Compare Post Acute Care list provided to:: Patient Represenative (must comment) Choice offered to / list presented to : Adult Children  Expected Discharge Plan and Services Expected Discharge Plan: [place] In-house Referral: Clinical Social Work Discharge Planning Services: CM Consult Post Acute Care Choice: [place] arrangements for the past 2 months: [place]                                      Prior Living Arrangements/Services Living arrangements for the past 2 months: Single Family Home Lives with::  Spouse Patient language and need for interpreter reviewed:: Yes Do you feel safe going back to the place where you live?: Yes      Need for Family Participation in Patient Care: Yes (Comment) Care giver support system in place?: No (comment)   Criminal Activity/Legal Involvement Pertinent to Current Situation/Hospitalization: No - Comment as needed  Activities of Daily Living      Permission Sought/Granted Permission sought to share information with : Family Chief Financial Officer Permission granted to share information with : Yes, Verbal Permission Granted  Share Information with NAME: [name]  Permission granted to share info w AGENCY: [place] granted to share info w Relationship: Daughter  Permission granted to share info w Contact Information: [phone]  Emotional Assessment Appearance:: Appears stated age Attitude/Demeanor/Rapport: Unable to Assess Affect (typically observed): Unable to Assess Orientation: : Oriented to Self,Oriented to Place,Oriented to Situation Alcohol / Substance Use: Not Applicable Psych Involvement: No (comment)  Admission diagnosis:  Acute on chronic respiratory failure with hypercapnia (HCC) [J96.22] Acute on chronic respiratory failure with hypoxia and hypercapnia (HCC) [T73.22, J96.22] Patient Active Problem List   Diagnosis Date Noted  . Acute on chronic respiratory failure with hypoxia and hypercapnia ([place]) 08/24/2020  . Bronchitis 08/24/2020  . Bronchopneumonia 08/24/2020  . GERD (gastroesophageal reflux disease) 08/24/2020  . Intestinal infection due to enteropathogenic E. coli   . Abnormal CT scan, colon   . Hematochezia   . Elevated CK 07/27/2020  .  SIRS (systemic inflammatory response syndrome) ([place]) 07/26/2020  . Acute hypercapnic respiratory failure ([place]) 07/26/2020  . AKI (acute kidney injury) ([place]) 07/26/2020  . Elevated brain natriuretic peptide (BNP) level 07/26/2020  . Volume overload  07/26/2020  . Hypotension 07/26/2020  . 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 Leg wound, right 06/10/2019  . Abscess of right leg   . Solitary pulmonary nodule 12/16/2018  . Essential hypertension 10/21/2018  . Chronic anticoagulation 10/21/2018  . Chronic low back pain 07/16/2018  . RLS (restless legs syndrome) 10/18/2016  . Peripheral neuropathy 10/18/2016  . Memory difficulties 09/12/2016  . Gait abnormality 09/12/2016  . History of colonic polyps 01/12/2016  . Tobacco use disorder 04/29/2015  . Dyspnea on exertion 03/19/2015  . COPD (chronic obstructive pulmonary disease) ([place]) 03/19/2015  . Insomnia 11/14/2013  . Vitamin B12 deficiency 11/14/2013  . Ataxia 11/13/2013  . Dizziness 11/12/2013  . Chronic atrial fibrillation ([place]) 11/12/2013   PCP:  Haywood Pao, MD Pharmacy:  No Pharmacies Listed    Social Determinants of Health (SDOH) Interventions    Readmission Risk Interventions Readmission Risk Prevention Plan 08/04/2020  Transportation Screening Complete  PCP or Specialist Appt within 5-7 Days Complete  Home Care Screening Complete  Medication Review (RN CM) Referral to Pharmacy  Some recent data might be hidden

## 2020-08-25 NOTE — Consult Note (Signed)
Consultation Note Date: 08/25/2020   Patient Name: Todd Lloyd  DOB: December 04, 1940  MRN: [id number]  Age / Sex: 80 y.o., male   PCP: [name], [name], MD Referring Physician: Albertine Patricia, MD   REASON FOR CONSULTATION:Establishing goals of care  Palliative Care consult requested for goals of care discussion in this 81 y.o. male with a medical history significant for fibrillation ([place]), GERD, leukemia s/p bone marrow transplant (1990), COPD, and stage I non-small cell lung cancer s/p radiation (04/2020).  Patient presented to the ER via EMS from [facility] with complaints of altered mental status and respiratory distress.  Patient required facemask due to desaturation on 5 L in route.  Patient was recently discharged on 4/1 after receiving treatment for acute on chronic blood loss, anemia, septic shock due to rotavirus and E. Coli.  Chest x-ray showed questionable pneumonia.  Patient receiving antibiotics.  Clinical Assessment and Goals of Care: I have reviewed medical records including lab results, imaging, Epic notes, and MAR, received report from the bedside RN, and assessed the patient.   I met at the bedside with [name] to discuss diagnosis prognosis, GOC, EOL wishes, disposition and options.  Patient is awake, alert and oriented x3.  Answers all questions appropriately.  Denies pain.  Reports he feels his breathing is much better stating "look at the monitor I am 100% and they have weaned me down to 2 L".   Patient is familiar to the palliative team and seen on previous admission in March 2022.  I re-introduced Palliative Medicine as specialized medical care for people living with serious illness. It focuses on providing relief from the symptoms and stress of a serious illness. The goal is to improve quality of life for both the patient and the family.  Patient verbalized understanding and appreciation.  We discussed a brief life review of the patient,  along with his functional and nutritional status.  Patient reports his first wife passed away with breast cancer.  He has been married to current wife for more than 30 years and is her sole caretaker in the setting of 2 strokes (2015 and 2019).  He has 1 daughter Todd Lloyd.  States he enjoys watching sports.  He is retired Dealer for [employer] and also worked for [employer] for over 10 years.   We discussed His current illness and what it means in the larger context of His on-going co-morbidities. Natural disease trajectory and expectations at EOL were discussed.  Brahim verbalizes his understanding of his current illness.  He states he did not require oxygen prior to admission however if required he understands the need including BiPAP.  Values and goals of care important to patient and family were attempted to be elicited.  Patient states he is hopeful he would be able to return home as his daughter is providing care for his wife.  He becomes tearful expressing his main goal is to regain some strength in the ability to continue to be her caregiver with awareness that his health is also at states.  He spends time sharing that he takes his valves very seriously and would not want to be away from his wife for a long period of time.  He does understand his prognosis is poor however states "as long as I have life in my body even if it means I am in a wheelchair my soul purpose is to be with her and provide her the care that she  needs until I can no longer do this!"  A detailed discussion was had today regarding advanced directives.  Concepts specific to code status, artifical feeding and hydration, continued IV antibiotics and rehospitalization. The difference between a aggressive medical intervention and a palliative comfort care path were discussed at length.    Patient confirms he does have an advanced directive naming his daughter Todd Lloyd as his Education officer, community.  MOST form completed  by our team during recent admission.  We reviewed document and offered to make any updates.  Patient declined expressing wishes remain for DNR/DNI, no artificial feedings/PEG, limited medical interventions, IV fluids and antibiotics for defined trial as indicated.  Patient reports if his condition was to worsen and he would not be able to return home or care for his wife he would then wish to be allowed a natural death in the home next to her.  He states he does not feel that he is at the point of this yet but knows at some point this will be what he will need.  I discussed the importance of continued conversation with family and their medical providers regarding overall plan of care and treatment options, ensuring decisions are within the context of the patients values and GOCs.  Hospice and Palliative Care services outpatient were explained and offered. Patient and family verbalized their understanding and awareness of both palliative and hospice's goals and philosophy of care.  Recommendations for continued palliative support outpatient provided.  Patient verbalized understanding and agreement.  Questions and concerns were addressed.  Mr. Barnett was encouraged to call with questions or concerns.  PMT will continue to support holistically as needed.   CODE STATUS: DNR  ADVANCE DIRECTIVES: Primary Decision Maker: Todd Lloyd (daughter)    SYMPTOM MANAGEMENT: per attending   Palliative Prophylaxis:   Aspiration, Bowel Regimen, Delirium Protocol, Eye Care, Frequent Pain Assessment and Oral Care  PSYCHO-SOCIAL/SPIRITUAL:  Support System: Family  Desire for further Chaplaincy support:No   Additional Recommendations (Limitations, Scope, Preferences):  No Artificial Feeding and Treat the treatable   Education on hospice/palliative    PAST MEDICAL HISTORY: Past Medical History:  Diagnosis Date  . Acid reflux   . Atrial fibrillation ([place])   . Chronic low back pain 07/16/2018  .  Colon polyps   . COPD (chronic obstructive pulmonary disease) ([place])   . Dysrhythmia    afib  . Gait abnormality 09/12/2016  . High cholesterol   . Hypertension   . Leukemia ([place])    s/p bone marrow transplant in 1990  . Memory difficulties 09/12/2016  . Peripheral neuropathy 10/18/2016  . RLS (restless legs syndrome) 10/18/2016    ALLERGIES:  is allergic to lyrica [pregabalin], morphine and related, and simvastatin.   MEDICATIONS:  Current Facility-Administered Medications  Medication Dose Route Frequency Provider Last Rate Last Admin  . acetaminophen (TYLENOL) tablet 650 mg  650 mg Oral Q6H PRN Rise Patience, MD   650 mg at 08/25/20 0030  . amoxicillin-clavulanate (AUGMENTIN) 875-125 MG per tablet 1 tablet  1 tablet Oral Q12H Thurnell Lose, MD   1 tablet at 08/25/20 [phone]  . apixaban (ELIQUIS) tablet 5 mg  5 mg Oral BID Adefeso, Oladapo, DO   5 mg at 08/25/20 4010  . arformoterol (BROVANA) nebulizer solution 15 mcg  15 mcg Nebulization BID Ramaswamy, Murali, MD      . budesonide (PULMICORT) nebulizer solution 0.25 mg  0.25 mg Nebulization BID Elgergawy, Silver Huguenin, MD      . digoxin (  LANOXIN) tablet 0.25 mg  0.25 mg Oral q morning Thurnell Lose, MD   0.25 mg at 08/25/20 0943  . donepezil (ARICEPT) tablet 10 mg  10 mg Oral QHS Adefeso, Oladapo, DO   10 mg at 08/24/20 2145  . ipratropium-albuterol (DUONEB) 0.5-2.5 (3) MG/3ML nebulizer solution 3 mL  3 mL Nebulization Q6H Elgergawy, Silver Huguenin, MD      . memantine [facility]) tablet 5 mg  5 mg Oral BID Adefeso, Oladapo, DO   5 mg at 08/25/20 9485  . methylPREDNISolone sodium succinate (SOLU-MEDROL) 125 mg/2 mL injection 60 mg  60 mg Intravenous Q12H Thurnell Lose, MD   60 mg at 08/25/20 1325  . pantoprazole (PROTONIX) EC tablet 40 mg  40 mg Oral Q0600 Adefeso, Oladapo, DO   40 mg at 08/25/20 0513  . revefenacin (YUPELRI) nebulizer solution 175 mcg  175 mcg Nebulization Daily Brand Males, MD        VITAL SIGNS: BP 121/70  (BP Location: Right Arm)   Pulse 82   Temp 97.9 F (36.6 C) (Oral)   Resp 16   Ht 6' (1.829 m)   Wt 84.5 kg   SpO2 98%   BMI 25.27 kg/m  Filed Weights   08/23/20 1915 08/24/20 0330  Weight: 87.8 kg 84.5 kg    Estimated body mass index is 25.27 kg/m as calculated from the following:   Height as of this encounter: 6' (1.829 m).   Weight as of this encounter: 84.5 kg.  LABS: CBC:    Component Value Date/Time   WBC 4.9 08/25/2020 0119   HGB 8.1 (L) 08/25/2020 0119   HGB 13.4 10/02/2018 1422   HCT 27.2 (L) 08/25/2020 0119   HCT 39.3 10/02/2018 1422   PLT 161 08/25/2020 0119   PLT 201 10/02/2018 1422   Comprehensive Metabolic Panel:    Component Value Date/Time   NA 139 08/25/2020 0119   NA 144 03/19/2019 1431   K 4.1 08/25/2020 0119   BUN 20 08/25/2020 0119   BUN 13 03/19/2019 1431   CREATININE 1.13 08/25/2020 0119   CREATININE 1.08 07/06/2020 1329   CREATININE 1.15 02/19/2017 1631   ALBUMIN 2.9 (L) 08/25/2020 0119   ALBUMIN 4.2 03/19/2019 1431     Review of Systems  Neurological: Positive for weakness.  Unless otherwise noted, a complete review of systems is negative.  Physical Exam General: NAD, chronically-ill appearing Cardiovascular: regular rate and rhythm Pulmonary: diminished bilaterally  Abdomen: soft, nontender, + bowel sounds Extremities: no edema, no joint deformities Skin: no rashes, warm and dry Neurological: AAO x3, mood appropriate    Prognosis: Guarded-Poor   Discharge Planning:  [place] for rehab with Palliative care service follow-up  Recommendations: . DNR/DNI-as confirmed by patient  . Continue with current plan of care, treat the treatable. No artificial feeding tubes/no HD, patient states if health was to decline to point he could not care for himself at some capacity or his wife he would not wish to be total care. Feels current quality of life is sufficient although not happy with level of weakness and inability to  ambulate independently.  . Patient states his main focus is allowing himself and opportunity to regain some strength and return home to assist in the care of his wife despite awareness of his own health concerns and decline.  . Recommendations for outpatient Palliative support to assist in ongoing discussions and support.  Marland Kitchen PMT will continue to support and follow as needed. Please call team  line with urgent needs.   Palliative Performance Scale: PPS 30%              Patient expressed understanding and was in agreement with this plan.   Thank you for allowing the Palliative Medicine Team to assist in the care of this patient. Please utilize secure chat with additional questions, if there is no response within 30 minutes please call the above phone number.   Time In: 1230 Time Out: 1320 Time Total: 50 min.   Visit consisted of counseling and education dealing with the complex and emotionally intense issues of symptom management and palliative care in the setting of serious and potentially life-threatening illness.Greater than 50%  of this time was spent counseling and coordinating care related to the above assessment and plan.  Signed by:  Alda Lea, AGPCNP-BC Palliative Medicine Team  Phone: [phone] Pager: [phone] Amion: [place] Team providers are available by phone from 7am to 7pm daily and can be reached through the team cell phone.  Should this patient require assistance outside of these hours, please call the patient's attending physician.

## 2020-08-25 NOTE — Procedures (Signed)
Patient Name: Todd Lloyd  MRN: [id number]  Epilepsy Attending: Lora Havens  Referring Physician/Provider: Dr Lala Lund Date: 08/24/2020  Duration: 23.17 mins  Patient history: 80 year old male with altered mental status. EEG to evaluate for seizures.  Level of alertness: Awake  AEDs during EEG study: None  Technical aspects: This EEG study was done with scalp electrodes positioned according to the 10-20 International system of electrode placement. Electrical activity was acquired at a sampling rate of 500Hz  and reviewed with a high frequency filter of 70Hz  and a low frequency filter of 1Hz . EEG data were recorded continuously and digitally stored.   Description: No posterior dominant rhythm was seen. EEG showed continuous generalized polymorphic mixed frequencies with predominantly 3 to 6 Hz theta-delta slowing. Hyperventilation and photic stimulation were not performed.     ABNORMALITY - Continuous slow, generalized  IMPRESSION: This study is suggestive of moderate diffuse encephalopathy, nonspecific etiology. No seizures or epileptiform discharges were seen throughout the recording.  Robin Petrakis Barbra Sarks

## 2020-08-25 NOTE — Consult Note (Signed)
NAME:  Todd Lloyd, MRN:  [id number], DOB:  Oct 04, 1940, LOS: 1 ADMISSION DATE:  08/23/2020, CONSULTATION DATE: 4/5 REFERRING MD:  singh, CHIEF COMPLAINT:  Recurrent hypercarbic respiratory failure    BRIEF  80 year old male patient just discharged from the hospital as mentioned below on 4/1.  Followed by [place] for chronic respiratory failure in the setting of COPD, and also history of stage I non-small cell lung cancer status post radiation therapy.  Was discharged to skilled nursing facility awake, oriented, with all mental faculties however was quite deconditioned requiring extensive physical therapy.  Per his daughter about 2 days after arrival to the skilled nursing facility she began to notice he was more fatigued, he stopped calling his family as frequently as usual, and on questioning him he reported worsening "hard day" over the last day or so.  He denied new fever, no significant worsening cough, no wheezing.  No lower extremity edema.  On 4/4 family found him minimally responsive, essentially head slumped over and drooling.  Family had to insist on he be evaluated in the emergency room.  In transport he was placed on supplemental oxygen, then subsequently placed on BiPAP.  Was found to have significant compensated hypercarbic respiratory failure but clinically improved with BiPAP.  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 coli diarrhea and rotavirus infection, acute kidney injury, and recurrent acute metabolic encephalopathy  Significant  Hospital Events: Including procedures, antibiotic start and stop dates in addition to other pertinent events   . 4/4 admitted.  Placed on broad-spectrum antibiotics, IV systemic steroids, BiPAP initial arterial blood gas pH 7.399, PCO2 81 PO2 78 bicarbonate 50 . 4/5 pH 7.42, PCO2 79, bicarbonate 51, PO2 60, now fully awake  Interim History / Subjective:    4/6 - > better byt frustrated he was not offered BiPAP. Says he was told he was fine and did not need it. PFt in 2016 - Gold stage 3 copd. EEG with encephalopathy  Objective   Blood pressure 121/70, pulse 82, temperature 97.9 F (36.6 C), temperature source Oral, resp. rate 16, height 6' (1.829 m), weight 84.5 kg, SpO2 98 %.        Intake/Output Summary (Last 24 hours) at 08/25/2020 1204 Last data filed at 08/25/2020 0400 Gross per 24 hour  Intake 920 ml  Output --  Net 920 ml   Filed Weights   08/23/20 1915 08/24/20 0330  Weight: 87.8 kg 84.5 kg    Examination: General Appearance:  Looks stable Head:  Normocephalic, without obvious abnormality, atraumatic Eyes:  PERRL - yes, conjunctiva/corneas - muddy     Ears:  Normal external ear canals, both ears Nose:  G tube - no but has [place] Throat:  ETT TUBE - no , OG tube - no Neck:  Supple,  No enlargement/tenderness/nodules Lungs: Clear to auscultation bilaterally,  Heart:  S1 and S2 normal, no murmur, CVP - no.  Pressors - no Abdomen:  Soft, no masses, no organomegaly Genitalia / Rectal:  Not done Extremities:  Extremities- intact Skin:  ntact in exposed areas . Sacral area - x Neurologic:  Sedation - none -> RASS - +1 . Moves all 4s - yes. CAM-ICU - neg . Orientation - x3+      Labs/imaging that I havepersonally reviewed  (right click and "Reselect all SmartList Selections" daily)    LABS   PFT Results Latest Ref Rng & Units 04/29/2015  FVC-Pre L 2.56  FVC-Predicted Pre % 57  FVC-Post L 2.82  FVC-Predicted Post % 63  Pre FEV1/FVC % % 57  Post FEV1/FCV % % 59   FEV1-Pre L 1.45  FEV1-Predicted Pre % 44  FEV1-Post L 1.67  DLCO uncorrected ml/min/mmHg 15.67  DLCO UNC% % 45  DLVA Predicted % 73  TLC L 6.62  TLC % Predicted % 90  RV % Predicted % 127      PULMONARY Recent Labs  Lab 08/23/20 1855 08/24/20 0020  PHART 7.399 7.419  PCO2ART 81.3* 78.7*  PO2ART 78* 60*  HCO3 50.2* 51.0*  TCO2 >50* >50*  O2SAT 94.0 90.0    CBC Recent Labs  Lab 08/23/20 1834 08/23/20 1855 08/24/20 0020 08/24/20 0945 08/25/20 0119  HGB 9.2*   < > 9.5* 8.5* 8.1*  HCT 30.6*   < > 28.0* 28.5* 27.2*  WBC 4.5  --   --  4.0 4.9  PLT 208  --   --  180 161   < > = values in this interval not displayed.    COAGULATION Recent Labs  Lab 08/25/20 0119  INR 1.2    CARDIAC  No results for input(s): TROPONINI in the last 168 hours. No results for input(s): PROBNP in the last 168 hours.   CHEMISTRY Recent Labs  Lab 08/19/20 0240 08/20/20 0356 08/23/20 1855 08/23/20 2002 08/24/20 0020 08/24/20 0945 08/25/20 0119  NA 142 142 141 137 142 145 139  K 3.6 4.1 3.3* 4.6 3.3* 3.5 4.1  CL 92* 90*  --  88*  --  91* 89*  CO2 45* 48*  --  39*  --  49* 46*  GLUCOSE 108* 99  --  80  --  89 120*  BUN 13 13  --  22  --  17 20  CREATININE 0.93 0.95  --  1.10  --  1.06 1.13  CALCIUM 8.6* 9.1  --  8.6*  --  8.7* 8.6*  MG 1.4* 1.7  --   --   --  1.3* 2.4  PHOS 3.5 2.7  --   --   --   --  3.0   Estimated Creatinine Clearance: 57.2 mL/min (by C-G formula based on SCr of 1.13 mg/dL).   LIVER Recent Labs  Lab 08/24/20 0945 08/25/20 0119  AST 22 24  ALT 17 17  ALKPHOS 88 88  BILITOT 0.5 0.5  PROT 5.7* 5.7*  ALBUMIN 2.9* 2.9*  INR  --  1.2     INFECTIOUS Recent Labs  Lab 08/24/20 0945 08/25/20 0119  PROCALCITON 0.10 <0.10     ENDOCRINE CBG (last 3)  No results for input(s): GLUCAP in the last 72 hours.       IMAGING x48h  - image(s) personally visualized  -   highlighted in bold CT HEAD WO CONTRAST  Result Date: 08/24/2020 CLINICAL  DATA:  Delirium, acute metabolic encephalopathy EXAM: CT HEAD WITHOUT CONTRAST TECHNIQUE: Contiguous axial images were obtained from the base of the skull through the vertex without intravenous contrast. COMPARISON:  Recent prior CT scan of the head 08/05/2020 FINDINGS: Brain: No evidence of acute infarction, hemorrhage, hydrocephalus, extra-axial collection or mass lesion/mass  effect. Stable mild cerebral and cerebellar cortical atrophy. Trace chronic microvascular ischemic white matter changes. Vascular: No hyperdense vessel or unexpected calcification. Skull: Normal. Negative for fracture or focal lesion. Sinuses/Orbits: No acute finding.  Bilateral scleral banding. Other: None. IMPRESSION: 1. No acute intracranial abnormality. 2. Stable mild atrophy and chronic microvascular ischemic white matter changes. Electronically Signed   By: Jacqulynn Cadet M.D.   On: 08/24/2020 11:48   DG Chest Port 1 View  Result Date: 08/24/2020 CLINICAL DATA:  Shortness of breath EXAM: PORTABLE CHEST 1 VIEW COMPARISON:  August 23, 2020 FINDINGS: Ill-defined airspace opacity right base is stable. Visualized lung otherwise clear. Note that a small portion of the lateral left base is not imaged. Heart size and pulmonary vascular normal. No adenopathy. There is aortic atherosclerosis. No bone lesions. IMPRESSION: Ill-defined opacity right base which may represent early developing pneumonia. Appearance stable compared to 1 day prior. Lungs elsewhere clear. Heart size normal. Aortic Atherosclerosis (ICD10-I70.0). Electronically Signed   By: Lowella Grip III M.D.   On: 08/24/2020 08:08   DG Chest Port 1 View  Result Date: 08/23/2020 CLINICAL DATA:  80 year old male with history of dyspnea and altered mental status. EXAM: PORTABLE CHEST 1 VIEW COMPARISON:  Chest x-ray 07/30/2020. FINDINGS: Lung volumes are low. Diffuse peribronchial cuffing and scattered areas of ill-defined interstitial prominence noted throughout the lungs  bilaterally, most evident in the medial aspect of the right upper lobe and medial aspect of the lower right lung. Trace right pleural effusion. No left pleural effusion. No pneumothorax. No evidence of pulmonary edema. Mild cardiomegaly. Upper mediastinal contours are within normal limits. Aortic atherosclerosis. IMPRESSION: 1. The appearance of the chest is concerning for bronchitis with developing multilobar bronchopneumonia. 2. Trace right pleural effusion. 3. Mild cardiomegaly. 4. Aortic atherosclerosis. Electronically Signed   By: Vinnie Langton M.D.   On: 08/23/2020 19:35   EEG adult  Result Date: 08/25/2020 Lora Havens, MD     08/25/2020  8:14 AM Patient Name: Todd Lloyd MRN: [id number] Epilepsy Attending: Lora Havens Referring Physician/Provider: Dr Lala Lund Date: 08/24/2020 Duration: 23.17 mins Patient history: 80 year old male with altered mental status. EEG to evaluate for seizures. Level of alertness: Awake AEDs during EEG study: None Technical aspects: This EEG study was done with scalp electrodes positioned according to the 10-20 International system of electrode placement. Electrical activity was acquired at a sampling rate of 500Hz  and reviewed with a high frequency filter of 70Hz  and a low frequency filter of 1Hz . EEG data were recorded continuously and digitally stored. Description: No posterior dominant rhythm was seen. EEG showed continuous generalized polymorphic mixed frequencies with predominantly 3 to 6 Hz theta-delta slowing. Hyperventilation and photic stimulation were not performed.   ABNORMALITY - Continuous slow, generalized IMPRESSION: This study is suggestive of moderate diffuse encephalopathy, nonspecific etiology. No seizures or epileptiform discharges were seen throughout the recording. Neoma Laming Problem list     Assessment & Plan:  Acute metabolic encephalopathy Seemingly multifactorial, he initially was hypoxic so  probably a mix of both hypoxia, hypercarbia, and possible infection.  He seems to walk a very fine line in regards to his hypercarbia.     08/25/2020 - improved mentatl status.   Plan Avoid narcotics Treat possible aspiration pneumonia BiPAP at night and as needed - emphasized in order for Bipap Check AChRab  Acute hypoxic and chronic hypercarbic respiratory failure: Secondary to what looks like possible right lower lobe pneumonia and  possibly COPD exacerbation.  It appears as though he has chronic respiratory acidosis on his arterial blood gas, there also may be a component of contraction alkalosis.   He is DO NOT RESUSCITATE & overall w/ very poor prognosis Plan  08/25/2020 - no wheeze  Plan Abx as below Taper steroids BD Check ABG 08/26/20 after bipap tonight Check repeat PFT   History of non-small cell lung cancer status post radiation therapy - fall/end 2021 Last CT Feb 2022  Plan Follow-up ONC  Might need CT inpatient  History of gastric reflux disease Plan PPI  History of hypertension Plan Per primary      SIGNATURE    Dr. Brand Males, M.D., F.C.C.P,  Pulmonary and Critical Care Medicine Staff Physician, [place] Director - Interstitial Lung Disease  Program  [facility] [place], [place], [address]  Pager: [phone], If no answer  OR between  19:00-7:00h: page [phone]  [phone] Telephone (clinical office): [phone] Telephone (research): [phone]  1:25 PM 08/25/2020

## 2020-08-26 ENCOUNTER — Inpatient Hospital Stay (HOSPITAL_COMMUNITY): Payer: Medicare Other

## 2020-08-26 ENCOUNTER — Encounter (HOSPITAL_COMMUNITY): Payer: Self-pay | Admitting: Internal Medicine

## 2020-08-26 DIAGNOSIS — G9340 Encephalopathy, unspecified: Secondary | ICD-10-CM

## 2020-08-26 DIAGNOSIS — J9621 Acute and chronic respiratory failure with hypoxia: Secondary | ICD-10-CM | POA: Diagnosis not present

## 2020-08-26 DIAGNOSIS — J9622 Acute and chronic respiratory failure with hypercapnia: Secondary | ICD-10-CM | POA: Diagnosis not present

## 2020-08-26 LAB — CBC WITH DIFFERENTIAL/PLATELET
Abs Immature Granulocytes: 0.05 10*3/uL (ref 0.00–0.07)
Basophils Absolute: 0 10*3/uL (ref 0.0–0.1)
Basophils Relative: 0 %
Eosinophils Absolute: 0 10*3/uL (ref 0.0–0.5)
Eosinophils Relative: 0 %
HCT: 25.1 % — ABNORMAL LOW (ref 39.0–52.0)
Hemoglobin: 7.6 g/dL — ABNORMAL LOW (ref 13.0–17.0)
Immature Granulocytes: 1 %
Lymphocytes Relative: 7 %
Lymphs Abs: 0.4 10*3/uL — ABNORMAL LOW (ref 0.7–4.0)
MCH: 30.2 pg (ref 26.0–34.0)
MCHC: 30.3 g/dL (ref 30.0–36.0)
MCV: 99.6 fL (ref 80.0–100.0)
Monocytes Absolute: 0.2 10*3/uL (ref 0.1–1.0)
Monocytes Relative: 3 %
Neutro Abs: 5.6 10*3/uL (ref 1.7–7.7)
Neutrophils Relative %: 89 %
Platelets: 141 10*3/uL — ABNORMAL LOW (ref 150–400)
RBC: 2.52 MIL/uL — ABNORMAL LOW (ref 4.22–5.81)
RDW: 21 % — ABNORMAL HIGH (ref 11.5–15.5)
WBC: 6.3 10*3/uL (ref 4.0–10.5)
nRBC: 0 % (ref 0.0–0.2)

## 2020-08-26 LAB — COMPREHENSIVE METABOLIC PANEL
ALT: 18 U/L (ref 0–44)
AST: 23 U/L (ref 15–41)
Albumin: 3.2 g/dL — ABNORMAL LOW (ref 3.5–5.0)
Alkaline Phosphatase: 83 U/L (ref 38–126)
Anion gap: 5 (ref 5–15)
BUN: 18 mg/dL (ref 8–23)
CO2: 44 mmol/L — ABNORMAL HIGH (ref 22–32)
Calcium: 8.7 mg/dL — ABNORMAL LOW (ref 8.9–10.3)
Chloride: 91 mmol/L — ABNORMAL LOW (ref 98–111)
Creatinine, Ser: 1.2 mg/dL (ref 0.61–1.24)
GFR, Estimated: >60 mL/min (ref >60)
Glucose, Bld: 146 mg/dL — ABNORMAL HIGH (ref 70–99)
Potassium: 4.3 mmol/L (ref 3.5–5.1)
Sodium: 140 mmol/L (ref 135–145)
Total Bilirubin: 0.5 mg/dL (ref 0.3–1.2)
Total Protein: 5.7 g/dL — ABNORMAL LOW (ref 6.5–8.1)

## 2020-08-26 LAB — PULMONARY FUNCTION TEST
DL/VA % pred: 68 %
DL/VA: 2.63 ml/min/mmHg/L
DLCO unc % pred: 19 %
DLCO unc: 5.01 ml/min/mmHg
FEF 25-75 Pre: 0.28 L/sec
FEF2575-%Pred-Pre: 13 %
FEV1-%Pred-Pre: 27 %
FEV1-Pre: 0.86 L
FEV1FVC-%Pred-Pre: 58 %
FEV6-%Pred-Pre: 45 %
FEV6-Pre: 1.84 L
FEV6FVC-%Pred-Pre: 96 %
FVC-%Pred-Pre: 47 %
FVC-Pre: 2.03 L
Pre FEV1/FVC ratio: 42 %
Pre FEV6/FVC Ratio: 90 %

## 2020-08-26 LAB — LEGIONELLA PNEUMOPHILA SEROGP 1 UR AG: L. pneumophila Serogp 1 Ur Ag: NEGATIVE

## 2020-08-26 LAB — BLOOD GAS, ARTERIAL
Acid-Base Excess: 13.5 mmol/L — ABNORMAL HIGH (ref 0.0–2.0)
Bicarbonate: 39 mmol/L — ABNORMAL HIGH (ref 20.0–28.0)
Drawn by: 51155
FIO2: 28
O2 Saturation: 96 %
Patient temperature: 36.8
pCO2 arterial: 64 mmHg — ABNORMAL HIGH (ref 32.0–48.0)
pH, Arterial: 7.401 (ref 7.350–7.450)
pO2, Arterial: 80.2 mmHg — ABNORMAL LOW (ref 83.0–108.0)

## 2020-08-26 LAB — BRAIN NATRIURETIC PEPTIDE: B Natriuretic Peptide: 516.2 pg/mL — ABNORMAL HIGH (ref 0.0–100.0)

## 2020-08-26 LAB — MAGNESIUM: Magnesium: 1.7 mg/dL (ref 1.7–2.4)

## 2020-08-26 LAB — PROCALCITONIN: Procalcitonin: <0.1 ng/mL

## 2020-08-26 MED ORDER — MAGNESIUM SULFATE 2 GM/50ML IV SOLN
2.0000 g | Freq: Once | INTRAVENOUS | Status: AC
  Administered 2020-08-26: 2 g via INTRAVENOUS

## 2020-08-26 MED ORDER — PREDNISONE 5 MG PO TABS
50.0000 mg | ORAL_TABLET | Freq: Every day | ORAL | Status: DC
  Administered 2020-08-27: 50 mg via ORAL
  Filled 2020-08-26: qty 2

## 2020-08-26 MED ORDER — PANTOPRAZOLE SODIUM 40 MG PO TBEC
40.0000 mg | DELAYED_RELEASE_TABLET | Freq: Two times a day (BID) | ORAL | Status: DC
  Administered 2020-08-26 – 2020-08-27 (×2): 40 mg via ORAL
  Filled 2020-08-26 (×2): qty 1

## 2020-08-26 NOTE — Progress Notes (Addendum)
RT at bedside to do a Bipap check, pt has already been taken off Bipap and placed on 2L [place].  Pt stated the machine was making too much noise and he asked to be taken off Bipap. Pt stated he doesn't want to wear Bipap anymore this morning.  RT obtained ABG per order and sent to the lab.

## 2020-08-26 NOTE — TOC Progression Note (Signed)
Transition of Care [facility]) - Progression Note    Patient Details  Name: Todd Lloyd MRN: [id number] Date of Birth: 09-Nov-1940  Transition of Care [facility]) CM/SW [place], LCSW Phone Number: 08/26/2020, 10:59 AM  Clinical Narrative:    CSW requested [place] order patient a Bipap and sent settings. Patient will need to take the mask from the hospital with him to [place].    Expected Discharge Plan: [place] Barriers to Discharge: Continued Medical Work up  Expected Discharge Plan and Services Expected Discharge Plan: [place] In-house Referral: Clinical Social Work Discharge Planning Services: CM Consult Post Acute Care Choice: [place] arrangements for the past 2 months: [place] Determinants of Health (SDOH) Interventions    Readmission Risk Interventions Readmission Risk Prevention Plan 08/04/2020  Transportation Screening Complete  PCP or Specialist Appt within 5-7 Days Complete  Home Care Screening Complete  Medication Review (RN CM) Referral to Pharmacy  Some recent data might be hidden

## 2020-08-26 NOTE — NC FL2 (Signed)
[place] LEVEL OF CARE SCREENING TOOL     IDENTIFICATION  Patient Name: Todd Lloyd Birthdate: Jan 28, 1941 Sex: male Admission Date (Current Location): 08/23/2020  [facility] and [place] Number:  Herbalist and Address:  [facility] [place], [place] [address], [place], [place] [address]      Provider Number: [id number]  Attending Physician Name and Address:  Elgergawy, Silver Huguenin, MD  Relative Name and Phone Number:  [name], [phone]    Current Level of Care: Hospital Recommended Level of Care: [place] Prior Approval Number:    Date Approved/Denied:   PASRR Number: [id number]  Discharge Plan: SNF    Current Diagnoses: Patient Active Problem List   Diagnosis Date Noted  . Acute on chronic respiratory failure with hypoxia and hypercapnia ([place]) 08/24/2020  . Bronchitis 08/24/2020  . Bronchopneumonia 08/24/2020  . GERD (gastroesophageal reflux disease) 08/24/2020  . Intestinal infection due to enteropathogenic E. coli   . Abnormal CT scan, colon   . Hematochezia   . Elevated CK 07/27/2020  . SIRS (systemic inflammatory response syndrome) ([place]) 07/26/2020  . Acute hypercapnic respiratory failure ([place]) 07/26/2020  . AKI (acute kidney injury) ([place]) 07/26/2020  . Elevated brain natriuretic peptide (BNP) level 07/26/2020  . Volume overload 07/26/2020  . Hypotension 07/26/2020  . Malignant neoplasm of lower lobe of right lung ([place]) 04/22/2020  . Leg wound, right 06/10/2019  . Abscess of right leg   . Solitary pulmonary nodule 12/16/2018  . Essential hypertension 10/21/2018  . Chronic anticoagulation 10/21/2018  . Chronic low back pain 07/16/2018  . RLS (restless legs syndrome) 10/18/2016  . Peripheral neuropathy 10/18/2016  . Memory difficulties 09/12/2016  . Gait abnormality 09/12/2016  . History of colonic polyps 01/12/2016  . Tobacco use disorder 04/29/2015  . Dyspnea on exertion 03/19/2015  . COPD  (chronic obstructive pulmonary disease) ([place]) 03/19/2015  . Insomnia 11/14/2013  . Vitamin B12 deficiency 11/14/2013  . Ataxia 11/13/2013  . Dizziness 11/12/2013  . Chronic atrial fibrillation ([place]) 11/12/2013    Orientation RESPIRATION BLADDER Height & Weight     Self,Time,Situation,Place  O2,Other (Comment) (3L nasal cannula, BiPap (IPAP= 14, EPAP=6)) Continent,External catheter Weight: 186 lb 4.6 oz (84.5 kg) Height:  6' (182.9 cm)  BEHAVIORAL SYMPTOMS/MOOD NEUROLOGICAL BOWEL NUTRITION STATUS      Continent Diet (See DC summary)  AMBULATORY STATUS COMMUNICATION OF NEEDS Skin   Limited Assist Verbally Other (Comment) (healing wound on right knee, Epithelized wound on pretibial left, moisture associated skin damage on anus, moisture associated skin damage on perineum,)                       Personal Care Assistance Level of Assistance  Bathing,Feeding,Dressing Bathing Assistance: Maximum assistance Feeding assistance: Independent Dressing Assistance: Maximum assistance     Functional Limitations Info  Sight,Hearing,Speech Sight Info: Impaired Hearing Info: Adequate Speech Info: Adequate    SPECIAL CARE FACTORS FREQUENCY  PT (By licensed PT),OT (By licensed OT)     PT Frequency: 5X per week OT Frequency: 5X per week            Contractures Contractures Info: Not present    Additional Factors Info  Code Status,Allergies Code Status Info: DNR Allergies Info: Lyrica, Morphine and related, Simvastatin           Current Medications (08/26/2020):  This is the current hospital active medication list Current Facility-Administered Medications  Medication Dose Route Frequency Provider Last  Rate Last Admin  . acetaminophen (TYLENOL) tablet 650 mg  650 mg Oral Q6H PRN Rise Patience, MD   650 mg at 08/25/20 2155  . amoxicillin-clavulanate (AUGMENTIN) 875-125 MG per tablet 1 tablet  1 tablet Oral Q12H Thurnell Lose, MD   1 tablet at 08/26/20 0902  . apixaban  (ELIQUIS) tablet 5 mg  5 mg Oral BID Adefeso, Oladapo, DO   5 mg at 08/26/20 0902  . arformoterol (BROVANA) nebulizer solution 15 mcg  15 mcg Nebulization BID Brand Males, MD   15 mcg at 08/26/20 9675  . budesonide (PULMICORT) nebulizer solution 0.25 mg  0.25 mg Nebulization BID Elgergawy, Silver Huguenin, MD   0.25 mg at 08/26/20 0814  . digoxin (LANOXIN) tablet 0.25 mg  0.25 mg Oral q morning Thurnell Lose, MD   0.25 mg at 08/26/20 0908  . donepezil (ARICEPT) tablet 10 mg  10 mg Oral QHS Adefeso, Oladapo, DO   10 mg at 08/25/20 2155  . ipratropium-albuterol (DUONEB) 0.5-2.5 (3) MG/3ML nebulizer solution 3 mL  3 mL Nebulization TID Elgergawy, Silver Huguenin, MD   3 mL at 08/26/20 0815  . magnesium sulfate IVPB 2 g 50 mL  2 g Intravenous Once Brand Males, MD      . memantine [facility]) tablet 5 mg  5 mg Oral BID Adefeso, Oladapo, DO   5 mg at 08/26/20 0902  . pantoprazole (PROTONIX) EC tablet 40 mg  40 mg Oral BID AC Elgergawy, Silver Huguenin, MD      . Derrill Memo ON 08/27/2020] predniSONE (DELTASONE) tablet 50 mg  50 mg Oral Q breakfast Brand Males, MD      . revefenacin (YUPELRI) nebulizer solution 175 mcg  175 mcg Nebulization Daily Brand Males, MD         Discharge Medications: Please see discharge summary for a list of discharge medications.  Relevant Imaging Results:  Relevant Lab Results:   Additional Information SS# [id number]  Glennon Hamilton, Student-Social Work

## 2020-08-26 NOTE — Progress Notes (Signed)
  Speech Language Pathology Treatment: Dysphagia  Patient Details Name: Todd Lloyd MRN: [id number] DOB: [birth year] Today's Date: 08/26/2020 Time: 1010-1039 SLP Time Calculation (min) (ACUTE ONLY): 29 min  Assessment / Plan / Recommendation Clinical Impression  Pt seen today to educate him to general aspiration and esophageal precautions.  He is located sitting upright in chair with oxygen turned on = ? To 3 Liters.  Pt admits to occasionally having drink more than food go down the "wrong way" - more so with water.  SLP educated pt to ideal respiratory pattern of exhale=swallow=exhale for maximal airway protection.  Observed him with water - initially requiring max verbal/visual cues - fading to mod I.  Encouraged pt to practice strategy.  Pt also reports liquids coming back up at times into throat - causing SLP to question primary esophageal/reflux issues.  Intake listed as 100% 4/6.   Pt may benefit from consideration for esophageal study *? Barium swallow* due to h/o lung cancer with XRT and GERD.  Using teach back, importance of using his flutter valve 10x an hour *as long as not dyspneic or pain* and assure cough/expectoration strong.  Advised to consider drinking thicker drinks with meals if decreases coughing and water between meals to assure hydrated.  Pt education completed and he states "I will do better".     HPI HPI: 80yo male admitted 08/23/20 with respiratory distress, AMS. PMH: COPD, Stage 1 non-small cell lung cancer s/p radiation (2021), dementia, PAFib, GERD, leukemia s/p bone marrow transplant (1990). Recent hospitalization for anemia, spetic shock rue to rotavirus and [place]. AKI, respiratory failure, COPD exacerbation. CXR = bronchitis, developing multilobar bronchopneumonia, trace right pleural effusion,Ill-defined opacity right base. Pt has been seen by palliative care and goals are for treating what is treatable.  Pt said to this SLP that he will get "well".      SLP Plan   All goals met       Recommendations  Medication Administration: Whole meds with liquid Compensations: Minimize environmental distractions;Slow rate;Small sips/bites;Follow solids with liquid Postural Changes and/or Swallow Maneuvers: Seated upright 90 degrees;Upright 30-60 min after meal                Oral Care Recommendations: Oral care BID Follow up Recommendations: Other (comment) (consider esophageal work up) SLP Visit Diagnosis: Dysphagia, unspecified (R13.10) Plan: All goals met       GO                Macario Golds 08/26/2020, 10:50 AM  Kathleen Lime, MS [facility] SLP Acute Rehab Services Office [phone] Pager [phone]

## 2020-08-26 NOTE — Progress Notes (Signed)
PROGRESS NOTE                                                                                                                                                                                                             Patient Demographics:    Todd Lloyd, is a 80 y.o. male, DOB - 1940-12-13, UGQ:[id number]  Outpatient Primary MD for the patient is [name], [name], MD    LOS - 2  Admit date - 08/23/2020    Chief Complaint  Patient presents with  . Respiratory Distress  . Altered Mental Status       Brief Narrative (HPI from H&P)     - Todd Lloyd is a 80 y.o. male with medical history significant for COPD, stage I non-small cell lung cancer s/p radiation in 04/2020, dementia, paroxysmal A. fib on Eliquis, GERD, leukemia s/p bone marrow transplantation in 1990 presents to the emergency department EMS, on hospital arrival he was hypoxic, hypercapnic was placed on BiPAP and admitted, note he had similar issues of becoming obtunded with high Co2 levels on recent hospitalizations but with relatively normal pH.   Subjective:    Todd Lloyd today, no chest pain, fever or chills, tolerated BiPAP overnight   eports he did not wear BiPAP overnight as no one told him he needs it  Assessment  & Plan :   Acute Metabolic encephalopathy  -Significantly improved, most likely due to both hypoxia and hypercarbia, possibly due to infection . -This has significantly improved, patient back to baseline -Avoid narcotics . Encouraged the patient to sit up in chair in the daytime use I-S and flutter valve for pulmonary toiletry.  Will advance activity and titrate down oxygen as possible.   Acute hypoxic on chronic hypercarbic respiratory failure -Secondary to what looks right lower lobe pneumonia, and COPD exacerbation -Continue with Augmentin, transition IV steroids to oral prednisone -Patient tolerating BiPAP overnight,  will have BiPAP arranged at discharge.  SpO2: 100 % O2 Flow Rate (L/min): 3 L/min FiO2 (%): 50 %  Recent Labs  Lab 08/20/20 0356 08/23/20 1834 08/23/20 1855 08/23/20 2057 08/24/20 0020 08/24/20 0945 08/25/20 0119 08/26/20 0244  WBC 4.5 4.5  --   --   --  4.0 4.9 6.3  HGB 8.7* 9.2* 9.9*  --  9.5* 8.5* 8.1* 7.6*  HCT 28.9* 30.6* 29.0*  --  28.0* 28.5* 27.2* 25.1*  PLT 198 208  --   --   --  180 161 141*  CRP  --   --   --   --   --  1.3* 2.3*  --   BNP  --   --   --   --   --  193.1* 272.0* 516.2*  DDIMER  --   --   --   --   --  1.15* 1.31*  --   PROCALCITON  --   --   --   --   --  0.10 <0.10 <0.10  AST  --   --   --   --   --  22 24 23   ALT  --   --   --   --   --  17 17 18   ALKPHOS  --   --   --   --   --  88 88 83  BILITOT  --   --   --   --   --  0.5 0.5 0.5  ALBUMIN  --   --   --   --   --  2.9* 2.9* 3.2*  INR  --   --   --   --   --   --  1.2  --   SARSCOV2NAA  --   --   --  NEGATIVE  --   --   --   --        ABG     Component Value Date/Time   PHART 7.401 08/26/2020 0330   PCO2ART 64.0 (H) 08/26/2020 0330   PO2ART 80.2 (L) 08/26/2020 0330   HCO3 39.0 (H) 08/26/2020 0330   TCO2 >50 (H) 08/24/2020 0020   ACIDBASEDEF 3.0 (H) 07/27/2020 0950   O2SAT 96.0 08/26/2020 0330      Right lung base pneumonia  -continue with Augmentin  History of dementia at risk for delirium.  Home Aricept and memantine continued.  At risk for delirium.  Chronic A. fib [place] vascular score of greater than 3.  Continue Digoxin and Eliquis combination.  Contraction alkalosis with hypotension.  Appears to be euvolemic, bicarb trending down.  Hold beta-blocker.  Hypomagnesemia.  Replace.  Recent history of GI bleed likely due to cecal ischemic insult.  On Eliquis monitor closely.  Has been seen by [facility] recently.  GERD.  On PPI.      Condition - Extremely Guarded  Family Communication  :  D/W daughter by phone.  Code Status : DNR  Consults  :  PCCM, Wilder  PUD  Prophylaxis : PPI   Procedures  :     CT Head - non acute      Disposition Plan  :    Status is: Observation  Dispo: The patient is from: Home              Anticipated d/c is to: Home              Patient currently is not medically stable to d/c.   Difficult to place patient No   DVT Prophylaxis  : Eliquis  Lab Results  Component Value Date   PLT 141 (L) 08/26/2020    Diet :  Diet Order            DIET SOFT Room service appropriate? Yes; Fluid consistency: Thin  Diet effective now  Inpatient Medications  Scheduled Meds: . amoxicillin-clavulanate  1 tablet Oral Q12H  . apixaban  5 mg Oral BID  . arformoterol  15 mcg Nebulization BID  . budesonide (PULMICORT) nebulizer solution  0.25 mg Nebulization BID  . digoxin  0.25 mg Oral q morning  . donepezil  10 mg Oral QHS  . ipratropium-albuterol  3 mL Nebulization TID  . memantine  5 mg Oral BID  . pantoprazole  40 mg Oral BID AC  . [START ON 08/27/2020] predniSONE  50 mg Oral Q breakfast  . revefenacin  175 mcg Nebulization Daily   Continuous Infusions:  PRN Meds:.  Antibiotics  :    Anti-infectives (From admission, onward)   Start     Dose/Rate Route Frequency Ordered Stop   08/25/20 0000  azithromycin (ZITHROMAX) 500 mg in sodium chloride 0.9 % 250 mL IVPB  Status:  Discontinued        500 mg 250 mL/hr over 60 Minutes Intravenous Every 24 hours 08/24/20 0643 08/24/20 1215   08/24/20 2200  cefTRIAXone (ROCEPHIN) 1 g in sodium chloride 0.9 % 100 mL IVPB  Status:  Discontinued        1 g 200 mL/hr over 30 Minutes Intravenous Every 24 hours 08/24/20 0643 08/24/20 1215   08/24/20 1315  amoxicillin-clavulanate (AUGMENTIN) 875-125 MG per tablet 1 tablet       Note to Pharmacy: Pharmacy can adjust for aspiration pneumonia   1 tablet Oral Every 12 hours 08/24/20 1215     08/24/20 0000  cefTRIAXone (ROCEPHIN) 1 g in sodium chloride 0.9 % 100 mL IVPB        1 g 200 mL/hr over 30 Minutes Intravenous   Once 08/23/20 2349 08/24/20 0159   08/24/20 0000  azithromycin (ZITHROMAX) 500 mg in sodium chloride 0.9 % 250 mL IVPB        500 mg 250 mL/hr over 60 Minutes Intravenous  Once 08/23/20 2349 08/24/20 0400       Crystall Donaldson M.D on 08/26/2020 at 3:51 PM  To page go to [email or web address]   [facility] -  Office  [phone]     See all Orders from today for further details    Objective:   Vitals:   08/26/20 0311 08/26/20 0739 08/26/20 0908 08/26/20 1207  BP: 136/66 (!) 154/91  112/84  Pulse: 76 82 96 91  Resp: 18 18  18   Temp: 98.3 F (36.8 C) 97.9 F (36.6 C)  98.1 F (36.7 C)  TempSrc: Oral Oral  Oral  SpO2: 94% 97%  100%  Weight:      Height:        Wt Readings from Last 3 Encounters:  08/24/20 84.5 kg  08/20/20 87.8 kg  04/22/20 93.6 kg     Intake/Output Summary (Last 24 hours) at 08/26/2020 1551 Last data filed at 08/26/2020 1200 Gross per 24 hour  Intake 480 ml  Output 1900 ml  Net -1420 ml     Physical Exam  Awake Alert, Oriented X 3, No new F.N deficits, Normal affect Symmetrical Chest wall movement, Good air movement bilaterally, scattered Rales RRR,No Gallops,Rubs or new Murmurs, No Parasternal Heave +ve B.Sounds, Abd Soft, No tenderness, No rebound - guarding or rigidity. No Cyanosis, Clubbing or edema, No new Rash or bruise       Data Review:    CBC Recent Labs  Lab 08/20/20 0356 08/23/20 1834 08/23/20 1855 08/24/20 0020 08/24/20 0945 08/25/20 0119 08/26/20 0244  WBC 4.5 4.5  --   --  4.0 4.9 6.3  HGB 8.7* 9.2* 9.9* 9.5* 8.5* 8.1* 7.6*  HCT 28.9* 30.6* 29.0* 28.0* 28.5* 27.2* 25.1*  PLT 198 208  --   --  180 161 141*  MCV 97.6 101.3*  --   --  100.0 100.4* 99.6  MCH 29.4 30.5  --   --  29.8 29.9 30.2  MCHC 30.1 30.1  --   --  29.8* 29.8* 30.3  RDW 18.9* 20.6*  --   --  20.8* 21.1* 21.0*  LYMPHSABS  --  0.8  --   --   --  0.6* 0.4*  MONOABS  --  0.9  --   --   --  0.3 0.2  EOSABS  --  0.1  --   --   --  0.1 0.0  BASOSABS   --  0.1  --   --   --  0.0 0.0    Recent Labs  Lab 08/20/20 0356 08/23/20 1855 08/23/20 2002 08/24/20 0020 08/24/20 0945 08/25/20 0119 08/26/20 0244  NA 142   < > 137 142 145 139 140  K 4.1   < > 4.6 3.3* 3.5 4.1 4.3  CL 90*  --  88*  --  91* 89* 91*  CO2 48*  --  39*  --  49* 46* 44*  GLUCOSE 99  --  80  --  89 120* 146*  BUN 13  --  22  --  17 20 18   CREATININE 0.95  --  1.10  --  1.06 1.13 1.20  CALCIUM 9.1  --  8.6*  --  8.7* 8.6* 8.7*  AST  --   --   --   --  22 24 23   ALT  --   --   --   --  17 17 18   ALKPHOS  --   --   --   --  88 88 83  BILITOT  --   --   --   --  0.5 0.5 0.5  ALBUMIN  --   --   --   --  2.9* 2.9* 3.2*  MG 1.7  --   --   --  1.3* 2.4 1.7  CRP  --   --   --   --  1.3* 2.3*  --   DDIMER  --   --   --   --  1.15* 1.31*  --   PROCALCITON  --   --   --   --  0.10 <0.10 <0.10  INR  --   --   --   --   --  1.2  --   BNP  --   --   --   --  193.1* 272.0* 516.2*   < > = values in this interval not displayed.    ------------------------------------------------------------------------------------------------------------------ No results for input(s): CHOL, HDL, LDLCALC, TRIG, CHOLHDL, LDLDIRECT in the last 72 hours.  No results found for: HGBA1C ------------------------------------------------------------------------------------------------------------------ No results for input(s): TSH, T4TOTAL, T3FREE, THYROIDAB in the last 72 hours.  Invalid input(s): FREET3  Cardiac Enzymes No results for input(s): CKMB, TROPONINI, MYOGLOBIN in the last 168 hours.  Invalid input(s): CK ------------------------------------------------------------------------------------------------------------------    Component Value Date/Time   BNP 516.2 (H) 08/26/2020 0244    Micro Results Recent Results (from the past 240 hour(s))  Resp Panel by RT-PCR (Flu A&B, Covid) Nasopharyngeal Swab     Status: None   Collection Time: 08/19/20 12:31 PM   Specimen: Nasopharyngeal  Swab; Nasopharyngeal(NP)  swabs in vial transport medium  Result Value Ref Range Status   SARS Coronavirus 2 by RT PCR NEGATIVE NEGATIVE Final    Comment: (NOTE) SARS-CoV-2 target nucleic acids are NOT DETECTED.  The SARS-CoV-2 RNA is generally detectable in upper respiratory specimens during the acute phase of infection. The lowest concentration of SARS-CoV-2 viral copies this assay can detect is 138 copies/mL. A negative result does not preclude SARS-Cov-2 infection and should not be used as the sole basis for treatment or other patient management decisions. A negative result may occur with  improper specimen collection/handling, submission of specimen other than nasopharyngeal swab, presence of viral mutation(s) within the areas targeted by this assay, and inadequate number of viral copies(<138 copies/mL). A negative result must be combined with clinical observations, patient history, and epidemiological information. The expected result is Negative.  Fact Sheet for Patients:  EntrepreneurPulse.com.au  Fact Sheet for Healthcare Providers:  IncredibleEmployment.be  This test is no t yet approved or cleared by the [place] FDA and  has been authorized for detection and/or diagnosis of SARS-CoV-2 by FDA under an Emergency Use Authorization (EUA). This EUA will remain  in effect (meaning this test can be used) for the duration of the COVID-19 declaration under Section 564(b)(1) of the Act, 21 U.S.C.section 360bbb-3(b)(1), unless the authorization is terminated  or revoked sooner.       Influenza A by PCR NEGATIVE NEGATIVE Final   Influenza B by PCR NEGATIVE NEGATIVE Final    Comment: (NOTE) The Xpert Xpress SARS-CoV-2/FLU/RSV plus assay is intended as an aid in the diagnosis of influenza from Nasopharyngeal swab specimens and should not be used as a sole basis for treatment. Nasal washings and aspirates are unacceptable for Xpert Xpress  SARS-CoV-2/FLU/RSV testing.  Fact Sheet for Patients: EntrepreneurPulse.com.au  Fact Sheet for Healthcare Providers: IncredibleEmployment.be  This test is not yet approved or cleared by the [place] FDA and has been authorized for detection and/or diagnosis of SARS-CoV-2 by FDA under an Emergency Use Authorization (EUA). This EUA will remain in effect (meaning this test can be used) for the duration of the COVID-19 declaration under Section 564(b)(1) of the Act, 21 U.S.C. section 360bbb-3(b)(1), unless the authorization is terminated or revoked.  Performed at [facility], [place] [address], [place], [place] [address]   Resp Panel by RT-PCR (Flu A&B, Covid) Nasopharyngeal Swab     Status: None   Collection Time: 08/23/20  8:57 PM   Specimen: Nasopharyngeal Swab; Nasopharyngeal(NP) swabs in vial transport medium  Result Value Ref Range Status   SARS Coronavirus 2 by RT PCR NEGATIVE NEGATIVE Final    Comment: (NOTE) SARS-CoV-2 target nucleic acids are NOT DETECTED.  The SARS-CoV-2 RNA is generally detectable in upper respiratory specimens during the acute phase of infection. The lowest concentration of SARS-CoV-2 viral copies this assay can detect is 138 copies/mL. A negative result does not preclude SARS-Cov-2 infection and should not be used as the sole basis for treatment or other patient management decisions. A negative result may occur with  improper specimen collection/handling, submission of specimen other than nasopharyngeal swab, presence of viral mutation(s) within the areas targeted by this assay, and inadequate number of viral copies(<138 copies/mL). A negative result must be combined with clinical observations, patient history, and epidemiological information. The expected result is Negative.  Fact Sheet for Patients:  EntrepreneurPulse.com.au  Fact Sheet for Healthcare Providers:   IncredibleEmployment.be  This test is no t yet approved or cleared by the [place] FDA  and  has been authorized for detection and/or diagnosis of SARS-CoV-2 by FDA under an Emergency Use Authorization (EUA). This EUA will remain  in effect (meaning this test can be used) for the duration of the COVID-19 declaration under Section 564(b)(1) of the Act, 21 U.S.C.section 360bbb-3(b)(1), unless the authorization is terminated  or revoked sooner.       Influenza A by PCR NEGATIVE NEGATIVE Final   Influenza B by PCR NEGATIVE NEGATIVE Final    Comment: (NOTE) The Xpert Xpress SARS-CoV-2/FLU/RSV plus assay is intended as an aid in the diagnosis of influenza from Nasopharyngeal swab specimens and should not be used as a sole basis for treatment. Nasal washings and aspirates are unacceptable for Xpert Xpress SARS-CoV-2/FLU/RSV testing.  Fact Sheet for Patients: EntrepreneurPulse.com.au  Fact Sheet for Healthcare Providers: IncredibleEmployment.be  This test is not yet approved or cleared by the [place] FDA and has been authorized for detection and/or diagnosis of SARS-CoV-2 by FDA under an Emergency Use Authorization (EUA). This EUA will remain in effect (meaning this test can be used) for the duration of the COVID-19 declaration under Section 564(b)(1) of the Act, 21 U.S.C. section 360bbb-3(b)(1), unless the authorization is terminated or revoked.  Performed at [facility], [place] [address] [place], [place] [address]   MRSA PCR Screening     Status: None   Collection Time: 08/24/20 11:01 AM   Specimen: Urine, Clean Catch; Nasopharyngeal  Result Value Ref Range Status   MRSA by PCR NEGATIVE NEGATIVE Final    Comment:        The GeneXpert MRSA Assay (FDA approved for NASAL specimens only), is one component of a comprehensive MRSA colonization surveillance program. It is not intended to diagnose  MRSA infection nor to guide or monitor treatment for MRSA infections. Performed at [facility], [place] [address], [place], [place] [address]     Radiology Reports CT ABDOMEN PELVIS WO CONTRAST  Result Date: 08/10/2020 CLINICAL DATA:  Anemia, colitis. EXAM: CT ABDOMEN AND PELVIS WITHOUT CONTRAST TECHNIQUE: Multidetector CT imaging of the abdomen and pelvis was performed following the standard protocol without IV contrast. COMPARISON:  July 26, 2020. FINDINGS: Lower chest: Bilateral pleural effusions are noted with adjacent subsegmental atelectasis. Hepatobiliary: No focal liver abnormality is seen. No gallstones, gallbladder wall thickening, or biliary dilatation. Pancreas: Unremarkable. No pancreatic ductal dilatation or surrounding inflammatory changes. Spleen: Normal in size without focal abnormality. Adrenals/Urinary Tract: Stable large calcification involving left adrenal gland consistent with old hematoma. Right adrenal gland appears normal. Small nonobstructive left renal calculus is noted. No hydronephrosis or renal obstruction is noted. Urinary bladder is decompressed secondary to Foley catheter. Stomach/Bowel: The stomach appears normal. There is no evidence of bowel obstruction. Status post appendectomy. There remains wall thickening involving the proximal os ascending colon, but it appears to be significantly improved compared to prior exam. Vascular/Lymphatic: Aortic atherosclerosis. No enlarged abdominal or pelvic lymph nodes. Reproductive: Prostate is unremarkable. Other: Mild anasarca is noted.  No ascites or hernia is noted. Musculoskeletal: No acute or significant osseous findings. IMPRESSION: 1. Bilateral pleural effusions are noted with adjacent subsegmental atelectasis. 2. Stable large calcification involving left adrenal gland consistent with old hematoma. 3. Small nonobstructive left renal calculus. No hydronephrosis or renal obstruction is noted. 4. There remains wall  thickening involving the proximal ascending colon, but it appears to be significantly improved compared to prior exam. 5. Mild anasarca is noted. 6. Aortic atherosclerosis. Aortic Atherosclerosis (ICD10-I70.0). Electronically Signed   By: Jeneen Rinks  Murlean Caller M.D.   On: 08/10/2020 19:25   CT HEAD WO CONTRAST  Result Date: 08/24/2020 CLINICAL DATA:  Delirium, acute metabolic encephalopathy EXAM: CT HEAD WITHOUT CONTRAST TECHNIQUE: Contiguous axial images were obtained from the base of the skull through the vertex without intravenous contrast. COMPARISON:  Recent prior CT scan of the head 08/05/2020 FINDINGS: Brain: No evidence of acute infarction, hemorrhage, hydrocephalus, extra-axial collection or mass lesion/mass effect. Stable mild cerebral and cerebellar cortical atrophy. Trace chronic microvascular ischemic white matter changes. Vascular: No hyperdense vessel or unexpected calcification. Skull: Normal. Negative for fracture or focal lesion. Sinuses/Orbits: No acute finding.  Bilateral scleral banding. Other: None. IMPRESSION: 1. No acute intracranial abnormality. 2. Stable mild atrophy and chronic microvascular ischemic white matter changes. Electronically Signed   By: Jacqulynn Cadet M.D.   On: 08/24/2020 11:48   CT HEAD WO CONTRAST  Result Date: 08/05/2020 CLINICAL DATA:  Expressive aphasia with stroke suspected EXAM: CT HEAD WITHOUT CONTRAST TECHNIQUE: Contiguous axial images were obtained from the base of the skull through the vertex without intravenous contrast. COMPARISON:  Ten days ago FINDINGS: Brain: No evidence of acute infarction, hemorrhage, hydrocephalus, extra-axial collection or mass lesion/mass effect. Generalized atrophy. Age normal appearance of white matter. Vascular: No hyperdense vessel or unexpected calcification. Skull: Normal. Negative for fracture or focal lesion. Sinuses/Orbits: No acute finding. Bilateral scleral banding and cataract resection. IMPRESSION: Stable head CT. No  visible infarct or other change from recent comparison. Electronically Signed   By: Monte Fantasia M.D.   On: 08/05/2020 05:01   US RENAL  Result Date: 07/28/2020 CLINICAL DATA:  Acute kidney injury EXAM: RENAL / URINARY TRACT ULTRASOUND COMPLETE COMPARISON:  None. FINDINGS: Right Kidney: Renal measurements: 10 x 6.2 x 5.7 cm = volume: 184.4 mL. Echogenicity within normal limits. No mass or hydronephrosis visualized. Left Kidney: Renal measurements: 10.2 x 6.6 x 6 cm = volume: 211.7 mL. Echogenicity within normal limits. 1.8 cm cyst at the upper pole. No hydronephrosis visualized. Bladder: Decompressed and not visualized Other: None. IMPRESSION: No hydronephrosis. Electronically Signed   By: Macy Mis M.D.   On: 07/28/2020 16:23   DG Chest Port 1 View  Result Date: 08/24/2020 CLINICAL DATA:  Shortness of breath EXAM: PORTABLE CHEST 1 VIEW COMPARISON:  August 23, 2020 FINDINGS: Ill-defined airspace opacity right base is stable. Visualized lung otherwise clear. Note that a small portion of the lateral left base is not imaged. Heart size and pulmonary vascular normal. No adenopathy. There is aortic atherosclerosis. No bone lesions. IMPRESSION: Ill-defined opacity right base which may represent early developing pneumonia. Appearance stable compared to 1 day prior. Lungs elsewhere clear. Heart size normal. Aortic Atherosclerosis (ICD10-I70.0). Electronically Signed   By: Lowella Grip III M.D.   On: 08/24/2020 08:08   DG Chest Port 1 View  Result Date: 08/23/2020 CLINICAL DATA:  80 year old male with history of dyspnea and altered mental status. EXAM: PORTABLE CHEST 1 VIEW COMPARISON:  Chest x-ray 07/30/2020. FINDINGS: Lung volumes are low. Diffuse peribronchial cuffing and scattered areas of ill-defined interstitial prominence noted throughout the lungs bilaterally, most evident in the medial aspect of the right upper lobe and medial aspect of the lower right lung. Trace right pleural effusion. No left  pleural effusion. No pneumothorax. No evidence of pulmonary edema. Mild cardiomegaly. Upper mediastinal contours are within normal limits. Aortic atherosclerosis. IMPRESSION: 1. The appearance of the chest is concerning for bronchitis with developing multilobar bronchopneumonia. 2. Trace right pleural effusion. 3. Mild cardiomegaly. 4. Aortic atherosclerosis.  Electronically Signed   By: Vinnie Langton M.D.   On: 08/23/2020 19:35   DG Chest Port 1 View  Result Date: 07/30/2020 CLINICAL DATA:  Respiratory failure EXAM: PORTABLE CHEST 1 VIEW COMPARISON:  07/26/2020 FINDINGS: The lungs are symmetrically well inflated. Minimal left basilar atelectasis. Mild coarsening of the pulmonary interstitium is again noted. No pneumothorax or pleural effusion. Cardiac size is mildly enlarged. Pulmonary vasculature is normal. No acute bone abnormality. IMPRESSION: No active disease.  Stable cardiomegaly. Electronically Signed   By: Fidela Salisbury MD   On: 07/30/2020 07:04   EEG adult  Result Date: 08/25/2020 Lora Havens, MD     08/25/2020  8:14 AM Patient Name: EDAN JUDAY MRN: [id number] Epilepsy Attending: Lora Havens Referring Physician/Provider: Dr Lala Lund Date: 08/24/2020 Duration: 23.17 mins Patient history: 80 year old male with altered mental status. EEG to evaluate for seizures. Level of alertness: Awake AEDs during EEG study: None Technical aspects: This EEG study was done with scalp electrodes positioned according to the 10-20 International system of electrode placement. Electrical activity was acquired at a sampling rate of 500Hz  and reviewed with a high frequency filter of 70Hz  and a low frequency filter of 1Hz . EEG data were recorded continuously and digitally stored. Description: No posterior dominant rhythm was seen. EEG showed continuous generalized polymorphic mixed frequencies with predominantly 3 to 6 Hz theta-delta slowing. Hyperventilation and photic stimulation were not performed.    ABNORMALITY - Continuous slow, generalized IMPRESSION: This study is suggestive of moderate diffuse encephalopathy, nonspecific etiology. No seizures or epileptiform discharges were seen throughout the recording. Priyanka O Yadav   VAS [place] LOWER EXTREMITY VENOUS (DVT)  Result Date: 08/09/2020  Lower Venous DVT Study Indications: Swelling, and Pain.  Comparison Study: No prior study Performing Technologist: Sharion Dove RVS  Examination Guidelines: A complete evaluation includes B-mode imaging, spectral Doppler, color Doppler, and power Doppler as needed of all accessible portions of each vessel. Bilateral testing is considered an integral part of a complete examination. Limited examinations for reoccurring indications may be performed as noted. The reflux portion of the exam is performed with the patient in reverse Trendelenburg.  +-----+---------------+---------+-----------+----------+--------------+ RIGHTCompressibilityPhasicitySpontaneityPropertiesThrombus Aging +-----+---------------+---------+-----------+----------+--------------+ CFV  Full           Yes      Yes                                 +-----+---------------+---------+-----------+----------+--------------+   +---------+---------------+---------+-----------+----------+--------------+ LEFT     CompressibilityPhasicitySpontaneityPropertiesThrombus Aging +---------+---------------+---------+-----------+----------+--------------+ CFV      Full           Yes      Yes                                 +---------+---------------+---------+-----------+----------+--------------+ SFJ      Full                                                        +---------+---------------+---------+-----------+----------+--------------+ FV Prox  Full                                                        +---------+---------------+---------+-----------+----------+--------------+  FV Mid   Full                                                         +---------+---------------+---------+-----------+----------+--------------+ FV DistalFull                                                        +---------+---------------+---------+-----------+----------+--------------+ PFV      Full                                                        +---------+---------------+---------+-----------+----------+--------------+ POP      Full           Yes      Yes                                 +---------+---------------+---------+-----------+----------+--------------+ PTV      Full                                                        +---------+---------------+---------+-----------+----------+--------------+ PERO     Full                                                        +---------+---------------+---------+-----------+----------+--------------+     Summary: RIGHT: - No evidence of common femoral vein obstruction.  LEFT: - There is no evidence of deep vein thrombosis in the lower extremity.  *See table(s) above for measurements and observations. Electronically signed by Servando Snare MD on 08/09/2020 at 11:19:19 AM.    Final

## 2020-08-26 NOTE — Progress Notes (Signed)
NAME:  Todd Lloyd, MRN:  [id number], DOB:  29-Jan-1941, LOS: 2 ADMISSION DATE:  08/23/2020, CONSULTATION DATE: 4/5 REFERRING MD:  singh, CHIEF COMPLAINT:  Recurrent hypercarbic respiratory failure    BRIEF  80 year old male patient just discharged from the hospital as mentioned below on 4/1.  Followed by [place] for chronic respiratory failure in the setting of COPD, and also history of stage I non-small cell lung cancer status post radiation therapy.  Was discharged to skilled nursing facility awake, oriented, with all mental faculties however was quite deconditioned requiring extensive physical therapy.  Per his daughter about 2 days after arrival to the skilled nursing facility she began to notice he was more fatigued, he stopped calling his family as frequently as usual, and on questioning him he reported worsening "hard day" over the last day or so.  He denied new fever, no significant worsening cough, no wheezing.  No lower extremity edema.  On 4/4 family found him minimally responsive, essentially head slumped over and drooling.  Family had to insist on he be evaluated in the emergency room.  In transport he was placed on supplemental oxygen, then subsequently placed on BiPAP.  Was found to have significant compensated hypercarbic respiratory failure but clinically improved with BiPAP.  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 coli diarrhea and rotavirus infection, acute kidney injury, and recurrent acute metabolic encephalopathy  Significant  Hospital Events: Including procedures, antibiotic start and stop dates in addition to other pertinent events   . 4/4 admitted.  Placed on broad-spectrum antibiotics, IV systemic steroids, BiPAP initial arterial blood gas pH 7.399, PCO2 81 PO2 78 bicarbonate 50 . 4/5 pH 7.42, PCO2 79, bicarbonate 51, PO2 60, now fully awake . 4/6 - > better byt frustrated he was not offered BiPAP. Says he was told he was fine and did not need it. PFt in 2016 - Gold stage 3 copd. EEG with encephalopathy  Interim History / Subjective:   4/7 - used bipap for few hours and then stopped due to noise. This morning he is wondering if he should use bipap qhs but at same time he says he feels fresh and energeized due to BiPAP. Wants to go home. Inpatient PFT pending. ABG with improved hypercapnia but still ocow 60s  Objective   Blood pressure (!) 154/91, pulse 96, temperature 97.9 F (36.6 C), temperature source Oral, resp. rate 18, height 6' (1.829 m), weight 84.5 kg, SpO2 97 %.        Intake/Output Summary (Last 24 hours) at 08/26/2020 0958 Last data filed at 08/26/2020 0753 Gross per 24 hour  Intake 480 ml  Output 2600 ml  Net -2120 ml   Filed Weights   08/23/20 1915 08/24/20 0330  Weight: 87.8 kg 84.5 kg    Examination: General Appearance:  Looks better . Sitting in chair by window on 5W and watching traffic. Head:  Normocephalic, without obvious abnormality, atraumatic Eyes:  PERRL - yes, conjunctiva/corneas - muddy     Ears:  Normal external ear canals, both ears Nose:  G tube - no but has Reading Throat:  ETT TUBE - no , OG tube - n Neck:  Supple,  No enlargement/tenderness/nodules Lungs: Clear to auscultation bilaterally,  Heart:  S1 and S2 normal, no murmur, CVP - x.  Pressors - x Abdomen:  Soft, no masses, no organomegaly Genitalia / Rectal:  Not done Extremities:  Extremities- intact Skin:  ntact in exposed areas . Sacral area - not examined Neurologic:  Sedation - none -> RASS - +1 . Moves all 4s -  yes. CAM-ICU - neg . Orientation - x3+       Labs/imaging that I havepersonally reviewed  (right click and "Reselect all SmartList Selections" daily)    LABS   PFT Results Latest Ref Rng & Units 04/29/2015  FVC-Pre L 2.56  FVC-Predicted Pre % 57  FVC-Post L 2.82  FVC-Predicted Post % 63  Pre FEV1/FVC % % 57  Post FEV1/FCV % % 59  FEV1-Pre L 1.45  FEV1-Predicted Pre % 44  FEV1-Post L 1.67  DLCO uncorrected ml/min/mmHg 15.67  DLCO UNC% % 45  DLVA Predicted % 73  TLC L 6.62  TLC % Predicted % 90  RV % Predicted % 127      PULMONARY Recent Labs  Lab 08/23/20 1855 08/24/20 0020 08/26/20 0330  PHART 7.399 7.419 7.401  PCO2ART 81.3* 78.7* 64.0*  PO2ART 78* 60* 80.2*  HCO3 50.2* 51.0* 39.0*  TCO2 >50* >50*  --   O2SAT 94.0 90.0 96.0    CBC Recent Labs  Lab 08/24/20 0945 08/25/20 0119 08/26/20 0244  HGB 8.5* 8.1* 7.6*  HCT 28.5* 27.2* 25.1*  WBC 4.0 4.9 6.3  PLT 180 161 141*    COAGULATION Recent Labs  Lab 08/25/20 0119  INR 1.2    CARDIAC  No results for input(s): TROPONINI in the last 168 hours. No results for input(s): PROBNP in the last 168 hours.   CHEMISTRY Recent Labs  Lab 08/20/20 0356 08/23/20 1855 08/23/20 2002 08/24/20 0020 08/24/20 0945 08/25/20 0119 08/26/20 0244  NA 142   < > 137 142 145 139 140  K 4.1   < > 4.6 3.3* 3.5 4.1 4.3  CL 90*  --  88*  --  91* 89* 91*  CO2 48*  --  39*  --  49* 46* 44*  GLUCOSE 99  --  80  --  89 120* 146*  BUN 13  --  22  --  17 20 18   CREATININE 0.95  --  1.10  --  1.06 1.13 1.20  CALCIUM 9.1  --  8.6*  --  8.7* 8.6* 8.7*  MG 1.7  --   --   --  1.3* 2.4 1.7  PHOS 2.7  --   --   --   --  3.0  --    < > = values in this interval not displayed.   Estimated Creatinine Clearance: 53.9 mL/min (by C-G formula based on SCr of 1.2 mg/dL).   LIVER Recent Labs  Lab 08/24/20 0945 08/25/20 0119 08/26/20 0244  AST 22 24 23   ALT 17 17 18   ALKPHOS 88 88 83  BILITOT 0.5 0.5 0.5  PROT 5.7* 5.7*  5.7*  ALBUMIN 2.9* 2.9* 3.2*  INR  --  1.2  --      INFECTIOUS Recent Labs  Lab 08/24/20 0945 08/25/20 0119 08/26/20 0244  PROCALCITON 0.10 <0.10 <0.10     ENDOCRINE CBG (last 3)  Recent Labs    08/25/20 1621  GLUCAP 166*  IMAGING x48h  - image(s) personally visualized  -   highlighted in bold CT HEAD WO CONTRAST  Result Date: 08/24/2020 CLINICAL DATA:  Delirium, acute metabolic encephalopathy EXAM: CT HEAD WITHOUT CONTRAST TECHNIQUE: Contiguous axial images were obtained from the base of the skull through the vertex without intravenous contrast. COMPARISON:  Recent prior CT scan of the head 08/05/2020 FINDINGS: Brain: No evidence of acute infarction, hemorrhage, hydrocephalus, extra-axial collection or mass lesion/mass effect. Stable mild cerebral and cerebellar cortical atrophy. Trace chronic microvascular ischemic white matter changes. Vascular: No hyperdense vessel or unexpected calcification. Skull: Normal. Negative for fracture or focal lesion. Sinuses/Orbits: No acute finding.  Bilateral scleral banding. Other: None. IMPRESSION: 1. No acute intracranial abnormality. 2. Stable mild atrophy and chronic microvascular ischemic white matter changes. Electronically Signed   By: Jacqulynn Cadet M.D.   On: 08/24/2020 11:48   EEG adult  Result Date: 08/25/2020 Lora Havens, MD     08/25/2020  8:14 AM Patient Name: Todd Lloyd MRN: [id number] Epilepsy Attending: Lora Havens Referring Physician/Provider: Dr Lala Lund Date: 08/24/2020 Duration: 23.17 mins Patient history: 80 year old male with altered mental status. EEG to evaluate for seizures. Level of alertness: Awake AEDs during EEG study: None Technical aspects: This EEG study was done with scalp electrodes positioned according to the 10-20 International system of electrode placement. Electrical activity was acquired at a sampling rate of 500Hz  and reviewed with a high frequency filter of 70Hz  and a low  frequency filter of 1Hz . EEG data were recorded continuously and digitally stored. Description: No posterior dominant rhythm was seen. EEG showed continuous generalized polymorphic mixed frequencies with predominantly 3 to 6 Hz theta-delta slowing. Hyperventilation and photic stimulation were not performed.   ABNORMALITY - Continuous slow, generalized IMPRESSION: This study is suggestive of moderate diffuse encephalopathy, nonspecific etiology. No seizures or epileptiform discharges were seen throughout the recording. Neoma Laming Problem list     Assessment & Plan:  Acute metabolic encephalopathy Seemingly multifactorial, he initially was hypoxic so probably a mix of both hypoxia, hypercarbia, and possible infection.  He seems to walk a very fine line in regards to his hypercarbia.     08/26/2020 - resolved mental status with bipap and Bd  Plan Avoid narcotics Treat possible aspiration pneumonia   - CCM recommends tapering of augmentin and total course 5-7 days BiPAP at night forever Await  AChRab from 08/26/2020   Acute hypoxic and chronic hypercarbic respiratory failure: Secondary to what looks like possible right lower lobe pneumonia and possibly COPD exacerbation.  It appears as though he has chronic respiratory acidosis on his arterial blood gas, there also may be a component of contraction alkalosis.  He is DO NOT RESUSCITATE    08/26/2020 - no wheeze. Improved hypercapnia but still high.   Plan Abx as below Taper steroids over next 7 -12 days  - changed solumedrol to prednisone 08/26/2020 BD - needed at dc Check repeat PFT later today Needs permannent QHS BiPAP - emphasized repeatedly to patient . Patient's chronic respiratory failure due to Gold stage 3-4 copd is life threatening.  Previous ABG's have documented high PCO2 and spirometry reveals severe obstructive ventilatory defect.  Patient would benefit from non-invasive ventilation.  Without this  therapy, the patient is at high risk of ending up with worsening symptoms, worsened respiratory failure, need for ER visits and/or recurrent hospitalizations.  Bilevel device unable to adequately support patient's nocturnal ventilation needs.  Patient would benefit  from NIV therapy with set tidal volumes and pressure. -   FU pulmonary with APP and [place] - see below   Mild low mag - hypomagnesemia  08/26/2020 - mag 1.7  Plan  - replete   History of non-small cell lung cancer status post radiation therapy - fall/end 2021 Last CT Feb 2022  Plan Follow-up ONC  Might need CT inpatient  History of gastric reflux disease Plan PPI  History of hypertension Plan Per primary    followup   Future Appointments  Date Time Provider [place]  08/26/2020  3:00 PM [facility] TECH [facility] None  09/01/2020  2:30 PM Magdalen Spatz, NP [facility] None  09/10/2020 10:40 AM Jackquline Denmark, MD [facility]  09/24/2020 10:15 AM Collene Gobble, MD [facility] None  10/19/2020  1:15 PM Suzzanne Cloud, NP [facility] None  01/10/2021  1:00 PM Hayden Pedro, PA-C [facility] None   Ccm will sign off    SIGNATURE    Dr. Brand Males, M.D., F.C.C.P,  Pulmonary and Critical Care Medicine Staff Physician, [place] Director - Interstitial Lung Disease  Program  Pulmonary [place] at [place], [place], [address]  Pager: [phone], If no answer  OR between  19:00-7:00h: page [phone] Telephone (clinical office): [phone] Telephone (research): [phone]  9:58 AM 08/26/2020

## 2020-08-26 NOTE — Progress Notes (Signed)
SLP Cancellation Note  Patient Details Name: Todd Lloyd MRN: [id number] DOB: August 29, 1940   Cancelled treatment:       Reason Eval/Treat Not Completed: Other (comment) (pt on [facility], will continue efforts)  Kathleen Lime, MS [facility] SLP Acute Rehab Services Office [phone] Pager [phone]   Macario Golds 08/26/2020, 10:01 AM

## 2020-08-26 NOTE — Progress Notes (Signed)
Physical Therapy Treatment Patient Details Name: Todd Lloyd MRN: [id number] DOB: 08/21/1940 Today's Date: 08/26/2020    History of Present Illness Pt is 80 y.o. male presents to ED 08/23/20 when EMS was activated at SNF due to patient having breathing difficulty.  Patient was reported to transiently desatted to 52 on 5 LPM via . Recent long hospitalization due to acute on chronic blood loss anemia, septic shock due to rotavirus and enteropathogenic E. coli diarrhea, while hospitalized AKI due to hypovolemia and acute hypoxic and hypercarbic respiratory failure due COPD exacerbation discharged on 4/1. In ED, pt showed macrocytic anemia, and BMP showing hypokalemia and elevated CO2. Pt placed on biPAP and O2 sat 96-100% Chest x-ray showed appearance of chest concerning for bronchitis with developing multilobar bronchopneumonia and trace right pleural effusion and mild cardiomegaly.  Admitted for Acute on chronic respiratory failure with hypoxia and hypercapnia PMH: COPD, stage I non-small cell lung cancer s/p radiation in 04/2020, dementia, paroxysmal A. fib on Eliquis, GERD, leukemia s/p bone marrow transplantation in 1990.    PT Comments    Pt making good progress.  He was able to increase gait to hallway distance on 3 L with VSS.  Pt requiring min A for balance and transfers to prevent falls. Pt was admitted from SNF per eval - continue to recommend return to SNF.     Follow Up Recommendations  SNF (return to SNF)     Equipment Recommendations  Rolling walker with 5" wheels    Recommendations for Other Services       Precautions / Restrictions Precautions Precautions: Fall Precaution Comments: watch sats and HR    Mobility  Bed Mobility Overal bed mobility: Needs Assistance Bed Mobility: Supine to Sit     Supine to sit: Min assist;HOB elevated          Transfers Overall transfer level: Needs assistance Equipment used: Rolling walker (2 wheeled) Transfers: Sit  to/from Stand Sit to Stand: Min assist         General transfer comment: min A to steady  Ambulation/Gait Ambulation/Gait assistance: Min Web designer (Feet): 120 Feet Assistive device: Rolling walker (2 wheeled) Gait Pattern/deviations: Step-through pattern Gait velocity: slowed   General Gait Details: min A for steadying and management of lines and leads   Stairs             Wheelchair Mobility    Modified Rankin (Stroke Patients Only)       Balance Overall balance assessment: Needs assistance Sitting-balance support: Feet supported;No upper extremity supported Sitting balance-Leahy Scale: Good     Standing balance support: Bilateral upper extremity supported Standing balance-Leahy Scale: Poor Standing balance comment: requiring RW                            Cognition Arousal/Alertness: Awake/alert Behavior During Therapy: WFL for tasks assessed/performed Overall Cognitive Status: No family/caregiver present to determine baseline cognitive functioning                           Safety/Judgement: Decreased awareness of safety;Decreased awareness of deficits            Exercises      General Comments General comments (skin integrity, edema, etc.): Pt on 3 L O2 with sats >95% throughout session.  Pt very pleased with his walk and reports feels better      Pertinent Vitals/Pain Pain Assessment: No/denies pain  Home Living                      Prior Function            PT Goals (current goals can now be found in the care plan section) Acute Rehab PT Goals Patient Stated Goal: be able to use my backpack blower, be how i was before PT Goal Formulation: With patient Time For Goal Achievement: 09/07/20 Potential to Achieve Goals: Fair Progress towards PT goals: Progressing toward goals    Frequency    Min 2X/week      PT Plan Current plan remains appropriate    Co-evaluation               AM-PAC PT "6 Clicks" Mobility   Outcome Measure  Help needed turning from your back to your side while in a flat bed without using bedrails?: A Little Help needed moving from lying on your back to sitting on the side of a flat bed without using bedrails?: A Little Help needed moving to and from a bed to a chair (including a wheelchair)?: A Little Help needed standing up from a chair using your arms (e.g., wheelchair or bedside chair)?: A Little Help needed to walk in hospital room?: A Little Help needed climbing 3-5 steps with a railing? : A Little 6 Click Score: 18    End of Session Equipment Utilized During Treatment: Gait belt;Oxygen Activity Tolerance: Patient tolerated treatment well Patient left: in bed;with call bell/phone within reach;with bed alarm set Nurse Communication: Mobility status PT Visit Diagnosis: Unsteadiness on feet (R26.81);Other abnormalities of gait and mobility (R26.89);Muscle weakness (generalized) (M62.81);Difficulty in walking, not elsewhere classified (R26.2)     Time: 9774-1423 PT Time Calculation (min) (ACUTE ONLY): 20 min  Charges:  $Gait Training: 8-22 mins                     Abran Richard, PT Acute Rehab Services Pager [phone] [facility] [place] 08/26/2020, 5:18 PM

## 2020-08-26 NOTE — Plan of Care (Signed)

## 2020-08-27 DIAGNOSIS — I482 Chronic atrial fibrillation, unspecified: Secondary | ICD-10-CM | POA: Diagnosis not present

## 2020-08-27 DIAGNOSIS — J9601 Acute respiratory failure with hypoxia: Secondary | ICD-10-CM | POA: Diagnosis not present

## 2020-08-27 DIAGNOSIS — C3490 Malignant neoplasm of unspecified part of unspecified bronchus or lung: Secondary | ICD-10-CM | POA: Diagnosis not present

## 2020-08-27 DIAGNOSIS — Z8719 Personal history of other diseases of the digestive system: Secondary | ICD-10-CM | POA: Diagnosis not present

## 2020-08-27 DIAGNOSIS — J449 Chronic obstructive pulmonary disease, unspecified: Secondary | ICD-10-CM | POA: Diagnosis not present

## 2020-08-27 DIAGNOSIS — K922 Gastrointestinal hemorrhage, unspecified: Secondary | ICD-10-CM | POA: Diagnosis not present

## 2020-08-27 DIAGNOSIS — I4891 Unspecified atrial fibrillation: Secondary | ICD-10-CM | POA: Diagnosis not present

## 2020-08-27 DIAGNOSIS — J9692 Respiratory failure, unspecified with hypercapnia: Secondary | ICD-10-CM | POA: Diagnosis not present

## 2020-08-27 DIAGNOSIS — M6281 Muscle weakness (generalized): Secondary | ICD-10-CM | POA: Diagnosis not present

## 2020-08-27 DIAGNOSIS — R7881 Bacteremia: Secondary | ICD-10-CM | POA: Diagnosis not present

## 2020-08-27 DIAGNOSIS — A080 Rotaviral enteritis: Secondary | ICD-10-CM | POA: Diagnosis not present

## 2020-08-27 DIAGNOSIS — C3491 Malignant neoplasm of unspecified part of right bronchus or lung: Secondary | ICD-10-CM | POA: Diagnosis not present

## 2020-08-27 DIAGNOSIS — J180 Bronchopneumonia, unspecified organism: Secondary | ICD-10-CM | POA: Diagnosis not present

## 2020-08-27 DIAGNOSIS — R279 Unspecified lack of coordination: Secondary | ICD-10-CM | POA: Diagnosis not present

## 2020-08-27 DIAGNOSIS — R498 Other voice and resonance disorders: Secondary | ICD-10-CM | POA: Diagnosis not present

## 2020-08-27 DIAGNOSIS — J9622 Acute and chronic respiratory failure with hypercapnia: Secondary | ICD-10-CM | POA: Diagnosis not present

## 2020-08-27 DIAGNOSIS — R2689 Other abnormalities of gait and mobility: Secondary | ICD-10-CM | POA: Diagnosis not present

## 2020-08-27 DIAGNOSIS — D649 Anemia, unspecified: Secondary | ICD-10-CM | POA: Diagnosis not present

## 2020-08-27 DIAGNOSIS — J9611 Chronic respiratory failure with hypoxia: Secondary | ICD-10-CM | POA: Diagnosis not present

## 2020-08-27 DIAGNOSIS — R2681 Unsteadiness on feet: Secondary | ICD-10-CM | POA: Diagnosis not present

## 2020-08-27 DIAGNOSIS — Z515 Encounter for palliative care: Secondary | ICD-10-CM | POA: Diagnosis not present

## 2020-08-27 DIAGNOSIS — G4733 Obstructive sleep apnea (adult) (pediatric): Secondary | ICD-10-CM | POA: Diagnosis not present

## 2020-08-27 DIAGNOSIS — Z743 Need for continuous supervision: Secondary | ICD-10-CM | POA: Diagnosis not present

## 2020-08-27 DIAGNOSIS — J9602 Acute respiratory failure with hypercapnia: Secondary | ICD-10-CM | POA: Diagnosis not present

## 2020-08-27 DIAGNOSIS — J9600 Acute respiratory failure, unspecified whether with hypoxia or hypercapnia: Secondary | ICD-10-CM | POA: Diagnosis not present

## 2020-08-27 DIAGNOSIS — J189 Pneumonia, unspecified organism: Secondary | ICD-10-CM | POA: Diagnosis not present

## 2020-08-27 DIAGNOSIS — J9612 Chronic respiratory failure with hypercapnia: Secondary | ICD-10-CM | POA: Diagnosis not present

## 2020-08-27 DIAGNOSIS — J90 Pleural effusion, not elsewhere classified: Secondary | ICD-10-CM | POA: Diagnosis not present

## 2020-08-27 DIAGNOSIS — R262 Difficulty in walking, not elsewhere classified: Secondary | ICD-10-CM | POA: Diagnosis not present

## 2020-08-27 DIAGNOSIS — Z66 Do not resuscitate: Secondary | ICD-10-CM | POA: Diagnosis not present

## 2020-08-27 DIAGNOSIS — I10 Essential (primary) hypertension: Secondary | ICD-10-CM | POA: Diagnosis not present

## 2020-08-27 DIAGNOSIS — N179 Acute kidney failure, unspecified: Secondary | ICD-10-CM | POA: Diagnosis not present

## 2020-08-27 DIAGNOSIS — R41841 Cognitive communication deficit: Secondary | ICD-10-CM | POA: Diagnosis not present

## 2020-08-27 DIAGNOSIS — K219 Gastro-esophageal reflux disease without esophagitis: Secondary | ICD-10-CM | POA: Diagnosis not present

## 2020-08-27 DIAGNOSIS — R531 Weakness: Secondary | ICD-10-CM | POA: Diagnosis not present

## 2020-08-27 DIAGNOSIS — R1312 Dysphagia, oropharyngeal phase: Secondary | ICD-10-CM | POA: Diagnosis not present

## 2020-08-27 DIAGNOSIS — G9341 Metabolic encephalopathy: Secondary | ICD-10-CM | POA: Diagnosis not present

## 2020-08-27 DIAGNOSIS — G4720 Circadian rhythm sleep disorder, unspecified type: Secondary | ICD-10-CM | POA: Diagnosis not present

## 2020-08-27 DIAGNOSIS — J9621 Acute and chronic respiratory failure with hypoxia: Secondary | ICD-10-CM | POA: Diagnosis not present

## 2020-08-27 DIAGNOSIS — K449 Diaphragmatic hernia without obstruction or gangrene: Secondary | ICD-10-CM | POA: Diagnosis not present

## 2020-08-27 LAB — COMPREHENSIVE METABOLIC PANEL WITH GFR
ALT: 23 U/L (ref 0–44)
AST: 27 U/L (ref 15–41)
Albumin: 3.3 g/dL — ABNORMAL LOW (ref 3.5–5.0)
Alkaline Phosphatase: 80 U/L (ref 38–126)
Anion gap: 10 (ref 5–15)
BUN: 19 mg/dL (ref 8–23)
CO2: 40 mmol/L — ABNORMAL HIGH (ref 22–32)
Calcium: 9 mg/dL (ref 8.9–10.3)
Chloride: 92 mmol/L — ABNORMAL LOW (ref 98–111)
Creatinine, Ser: 1.33 mg/dL — ABNORMAL HIGH (ref 0.61–1.24)
GFR, Estimated: 54 mL/min — ABNORMAL LOW
Glucose, Bld: 105 mg/dL — ABNORMAL HIGH (ref 70–99)
Potassium: 3.8 mmol/L (ref 3.5–5.1)
Sodium: 142 mmol/L (ref 135–145)
Total Bilirubin: 0.2 mg/dL — ABNORMAL LOW (ref 0.3–1.2)
Total Protein: 5.9 g/dL — ABNORMAL LOW (ref 6.5–8.1)

## 2020-08-27 LAB — CBC WITH DIFFERENTIAL/PLATELET
Abs Immature Granulocytes: 0.07 K/uL (ref 0.00–0.07)
Basophils Absolute: 0 K/uL (ref 0.0–0.1)
Basophils Relative: 0 %
Eosinophils Absolute: 0 K/uL (ref 0.0–0.5)
Eosinophils Relative: 0 %
HCT: 26.2 % — ABNORMAL LOW (ref 39.0–52.0)
Hemoglobin: 7.9 g/dL — ABNORMAL LOW (ref 13.0–17.0)
Immature Granulocytes: 1 %
Lymphocytes Relative: 8 %
Lymphs Abs: 0.8 K/uL (ref 0.7–4.0)
MCH: 30 pg (ref 26.0–34.0)
MCHC: 30.2 g/dL (ref 30.0–36.0)
MCV: 99.6 fL (ref 80.0–100.0)
Monocytes Absolute: 1.1 K/uL — ABNORMAL HIGH (ref 0.1–1.0)
Monocytes Relative: 12 %
Neutro Abs: 7.2 K/uL (ref 1.7–7.7)
Neutrophils Relative %: 79 %
Platelets: 147 K/uL — ABNORMAL LOW (ref 150–400)
RBC: 2.63 MIL/uL — ABNORMAL LOW (ref 4.22–5.81)
RDW: 21.6 % — ABNORMAL HIGH (ref 11.5–15.5)
WBC: 8.7 K/uL (ref 4.0–10.5)
nRBC: 0 % (ref 0.0–0.2)

## 2020-08-27 LAB — BRAIN NATRIURETIC PEPTIDE: B Natriuretic Peptide: 490.4 pg/mL — ABNORMAL HIGH (ref 0.0–100.0)

## 2020-08-27 LAB — PROCALCITONIN: Procalcitonin: <0.1 ng/mL

## 2020-08-27 LAB — MAGNESIUM: Magnesium: 1.9 mg/dL (ref 1.7–2.4)

## 2020-08-27 MED ORDER — FUROSEMIDE 40 MG PO TABS: 40.0000 mg | ORAL_TABLET | Freq: Every day | ORAL | Status: DC | PRN

## 2020-08-27 MED ORDER — FUROSEMIDE 40 MG PO TABS: 20.0000 mg | ORAL_TABLET | Freq: Every day | ORAL | Status: DC

## 2020-08-27 MED ORDER — AMOXICILLIN-POT CLAVULANATE 875-125 MG PO TABS: 1.0000 | ORAL_TABLET | Freq: Two times a day (BID) | ORAL | 0 refills | Status: DC

## 2020-08-27 MED ORDER — ALBUTEROL SULFATE (2.5 MG/3ML) 0.083% IN NEBU: 2.5000 mg | INHALATION_SOLUTION | Freq: Four times a day (QID) | RESPIRATORY_TRACT | Status: DC | PRN

## 2020-08-27 MED ORDER — METOPROLOL TARTRATE 25 MG PO TABS: 12.5000 mg | ORAL_TABLET | Freq: Two times a day (BID) | ORAL | Status: AC

## 2020-08-27 MED ORDER — PREDNISONE 10 MG PO TABS: ORAL_TABLET | ORAL | 0 refills | Status: DC

## 2020-08-27 NOTE — Progress Notes (Signed)
Report given to RN Quillian Quince at [place]. All questions asked and answered. PIV removed, BIPAP mask sent with patient per CM request, awaiting for PTAR transportation.

## 2020-08-27 NOTE — Progress Notes (Signed)
RN made RT aware that pt. Ripped BIPAP mask off around 2AM. Pt. Currently back on 3L [place], VSS, RT will continue to monitor

## 2020-08-27 NOTE — Care Management Important Message (Signed)
Important Message  Patient Details  Name: Todd Lloyd MRN: [id number] Date of Birth: 20-Jul-1940   Medicare Important Message Given:  Yes - Important Message mailed due to current National Emergency   Verbal consent obtained due to current National Emergency  Relationship to patient: Self Contact Name: Audiel Call Date: 08/27/20  Time: 1116 Phone: [phone] Outcome: Spoke with contact Important Message mailed to: Patient address on file     [name] 08/27/2020, 11:17 AM

## 2020-08-27 NOTE — Discharge Instructions (Signed)
Follow with Primary MD [name], [name], MD or SNF physician  Get CBC, CMP,  checked  by Primary MD next visit.    Activity: As tolerated with Full fall precautions use walker/cane & assistance as needed   Disposition SNF  Diet: Heart Healthy /Soft  , with feeding assistance and aspiration precautions.  For Heart failure patients - Check your Weight same time everyday, if you gain over 2 pounds, or you develop in leg swelling, experience more shortness of breath or chest pain, call your Primary MD immediately. Follow Cardiac Low Salt Diet and 1.5 lit/day fluid restriction.   On your next visit with your primary care physician please Get Medicines reviewed and adjusted.   Please request your Prim.MD to go over all Hospital Tests and Procedure/Radiological results at the follow up, please get all Hospital records sent to your Prim MD by signing hospital release before you go home.   If you experience worsening of your admission symptoms, develop shortness of breath, life threatening emergency, suicidal or homicidal thoughts you must seek medical attention immediately by calling 911 or calling your MD immediately  if symptoms less severe.  You Must read complete instructions/literature along with all the possible adverse reactions/side effects for all the Medicines you take and that have been prescribed to you. Take any new Medicines after you have completely understood and accpet all the possible adverse reactions/side effects.   Do not drive, operating heavy machinery, perform activities at heights, swimming or participation in water activities or provide baby sitting services if your were admitted for syncope or siezures until you have seen by Primary MD or a Neurologist and advised to do so again.  Do not drive when taking Pain medications.    Do not take more than prescribed Pain, Sleep and Anxiety Medications  Special Instructions: If you have smoked or chewed Tobacco  in the  last 2 yrs please stop smoking, stop any regular Alcohol  and or any Recreational drug use.  Wear Seat belts while driving.   Please note  You were cared for by a hospitalist during your hospital stay. If you have any questions about your discharge medications or the care you received while you were in the hospital after you are discharged, you can call the unit and asked to speak with the hospitalist on call if the hospitalist that took care of you is not available. Once you are discharged, your primary care physician will handle any further medical issues. Please note that NO REFILLS for any discharge medications will be authorized once you are discharged, as it is imperative that you return to your primary care physician (or establish a relationship with a primary care physician if you do not have one) for your aftercare needs so that they can reassess your need for medications and monitor your lab values.

## 2020-08-27 NOTE — Discharge Summary (Signed)
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 (meaning this test can be used) for the duration of the COVID-19 declaration under Section 564(b)(1) of the Act, 21 U.S.C.section 360bbb-3(b)(1), unless the authorization is terminated  or revoked sooner.       Influenza A by PCR NEGATIVE NEGATIVE Final   Influenza B by PCR NEGATIVE NEGATIVE Final    Comment: (NOTE) The Xpert Xpress SARS-CoV-2/FLU/RSV plus assay is intended as an aid in the diagnosis of influenza from Nasopharyngeal swab specimens and should not be used as a sole basis for treatment. Nasal washings and aspirates are unacceptable for Xpert Xpress  SARS-CoV-2/FLU/RSV testing.  Fact Sheet for Patients: EntrepreneurPulse.com.au  Fact Sheet for Healthcare Providers: IncredibleEmployment.be  This test is not yet approved or cleared by the [place] FDA and has been authorized for detection and/or diagnosis of SARS-CoV-2 by FDA under an Emergency Use Authorization (EUA). This EUA will remain in effect (meaning this test can be used) for the duration of the COVID-19 declaration under Section 564(b)(1) of the Act, 21 U.S.C. section 360bbb-3(b)(1), unless the authorization is terminated or revoked.  Performed at [facility], [place] [address], [place], [place] [address]   MRSA PCR Screening     Status: None   Collection Time: 08/24/20 11:01 AM   Specimen: Urine, Clean Catch; Nasopharyngeal  Result Value Ref Range Status   MRSA by PCR NEGATIVE NEGATIVE Final    Comment:        The GeneXpert MRSA Assay (FDA approved for NASAL specimens only), is one component of a comprehensive MRSA colonization surveillance program. It is not intended to diagnose MRSA infection nor to guide or monitor treatment for MRSA infections. Performed at [facility], [place] [address] [place], [place] [address]        Today   Subjective:   Todd Lloyd today has no headache,no chest or abdominal pain, generalized weakness, but overall he is feeling much better.    Objective:   Blood pressure 132/77, pulse 93, temperature 98.7 F (37.1 C), temperature source Oral, resp. rate 20, height 6' (1.829 m), weight 84.5 kg, SpO2 100 %.   Intake/Output Summary (Last 24 hours) at 08/27/2020 1151 Last data filed at 08/26/2020 1935 Gross per 24 hour  Intake 50 ml  Output 950 ml  Net -900 ml    Exam Awake Alert, easily distracted, No new F.N deficits, Normal affect Symmetrical Chest wall movement, Good air movement bilaterally, scattered rales RRR,No Gallops,Rubs or new Murmurs, No Parasternal  Heave +ve B.Sounds, Abd Soft, Non tender, No organomegaly appriciated, No rebound -guarding or rigidity. No Cyanosis, Clubbing or edema, No new Rash or bruise  Data Review   CBC w Diff:  Lab Results  Component Value Date   WBC 8.7 08/27/2020   HGB 7.9 (L) 08/27/2020   HGB 13.4 10/02/2018  HCT 26.2 (L) 08/27/2020   HCT 39.3 10/02/2018   PLT 147 (L) 08/27/2020   PLT 201 10/02/2018   LYMPHOPCT 8 08/27/2020   MONOPCT 12 08/27/2020   EOSPCT 0 08/27/2020   BASOPCT 0 08/27/2020    CMP:  Lab Results  Component Value Date   NA 142 08/27/2020   NA 144 03/19/2019   K 3.8 08/27/2020   CL 92 (L) 08/27/2020   CO2 40 (H) 08/27/2020   BUN 19 08/27/2020   BUN 13 03/19/2019   CREATININE 1.33 (H) 08/27/2020   CREATININE 1.08 07/06/2020   CREATININE 1.15 02/19/2017   PROT 5.9 (L) 08/27/2020   PROT 5.9 (L) 03/19/2019   ALBUMIN 3.3 (L) 08/27/2020   ALBUMIN 4.2 03/19/2019   BILITOT 0.2 (L) 08/27/2020   BILITOT 0.4 03/19/2019   ALKPHOS 80 08/27/2020   AST 27 08/27/2020   ALT 23 08/27/2020  .   Total Time in preparing paper work, data evaluation and todays exam - 32 minutes  Phillips Climes M.D on 08/27/2020 at 11:51 AM  [facility]   Office  [phone]

## 2020-08-27 NOTE — Progress Notes (Signed)
   Daily Progress Note   Patient Name: Todd Lloyd       Date: 08/27/2020 DOB: [birth year]  Age: 80 y.o. MRN#: [id number] Attending Physician: Albertine Patricia, MD Primary Care Physician: Haywood Pao, MD Admit Date: 08/23/2020  Reason for Consultation/Follow-up: Establishing goals of care  Subjective: Resting, easily awakens with verbal stimuli. Denies pain. Reports he is feeling much better and is breathing. Express he is thankful for ability to work with PT and seems to have increased strength although endorses some continued weakness.   Shares plan to discharge to SNF for continued rehab with goal to return home and continue to assist in the care of his wife.   Education provided on outpatient palliative support. Patient verbalized understanding.   All questions answered and support provided.   Length of Stay: 3 days  Vital Signs: BP (!) 148/75 (BP Location: Right Arm)   Pulse 90   Temp 98.7 F (37.1 C) (Oral)   Resp 18   Ht 6' (1.829 m)   Wt 84.5 kg   SpO2 100%   BMI 25.27 kg/m  SpO2: SpO2: 100 % O2 Device: O2 Device: Nasal Cannula O2 Flow Rate: O2 Flow Rate (L/min): 3 L/min  Physical Exam: Resting, easily awaken, AAOx3, chronically-ill appearing RRR Mood appropriate              Palliative Care Assessment & Plan  HPI: Palliative Care consult requested for goals of care discussion in this 80 y.o. male with a medical history significant for fibrillation (Eliquis), GERD, leukemia s/p bone marrow transplant (1990), COPD, and stage I non-small cell lung cancer s/p radiation (04/2020).  Patient presented to the ER via EMS from [facility] with complaints of altered mental status and respiratory distress.  Patient required facemask due to desaturation on 5 L in route.  Patient was recently discharged on 4/1 after receiving treatment for acute on chronic blood loss, anemia, septic shock due to rotavirus and E. Coli.  Chest x-ray showed questionable  pneumonia.  Patient receiving antibiotics.  Code Status:  DNR  Goals of Care/Recommendations:  Continue current plan of care  Patient expressed goals are clear. He remains hopeful for some improvement/stabilitity with plans to discharge to rehab with a goal of returning home and caring for his wife.   Would recommend outpatient palliative for continued support and ongoing goals of care discussion.   PMT will continue to support and follow as needed.    Thank you for allowing the Palliative Medicine Team to assist in the care of this patient.  Time Total: 20 min   Visit consisted of counseling and education dealing with the complex and emotionally intense issues of symptom management and palliative care in the setting of serious and potentially life-threatening illness.Greater than 50%  of this time was spent counseling and coordinating care related to the above assessment and plan.  Alda Lea, AGPCNP-BC  Palliative Medicine Team [phone]

## 2020-08-27 NOTE — TOC Transition Note (Signed)
Transition of Care [facility]) - CM/SW Discharge Note   Patient Details  Name: Todd Lloyd MRN: [id number] Date of Birth: 1940/05/30  [facility] CM/SW Contact:  Todd Halsted, LCSW Phone Number: 08/27/2020, 12:29 PM   Clinical Narrative:    Patient will DC to: [facility] Anticipated DC date: 08/27/20 Family notified: Daughter, Information systems manager by: Corey Harold  RN, please make sure patient's Bipap mask goes with him to SNF.  Per MD patient ready for DC to [facility]. RN to call report prior to discharge [phone]). RN, patient, patient's family, and facility notified of DC. Discharge Summary and FL2 sent to facility. DC packet on chart. Ambulance transport requested for patient.   CSW will sign off for now as social work intervention is no longer needed. Please consult [place] again if new needs arise.      Final next level of care: Skilled Nursing Facility Barriers to Discharge: Barriers Resolved   Patient Goals and CMS Choice Patient states their goals for this hospitalization and ongoing recovery are:: to feel better CMS Medicare.gov Compare Post Acute Care list provided to:: Patient Represenative (must comment) Choice offered to / list presented to : Adult Children  Discharge Placement   Existing PASRR number confirmed : 08/27/20          Patient chooses bed at: [facility] Patient to be transferred to facility by: [facility] Name of family member notified: [name], daugher Patient and family notified of of transfer: 08/27/20  Discharge Plan and Services In-house Referral: Clinical Social Work Discharge Planning Services: CM Consult Post Acute Care Choice: [place]                               Social Determinants of Health (SDOH) Interventions     Readmission Risk Interventions Readmission Risk Prevention Plan 08/04/2020  Transportation Screening Complete  PCP or Specialist Appt within 5-7 Days Complete  Home Care Screening Complete   Medication Review (RN CM) Referral to Pharmacy  Some recent data might be hidden

## 2020-08-30 DIAGNOSIS — D649 Anemia, unspecified: Secondary | ICD-10-CM | POA: Diagnosis not present

## 2020-08-30 DIAGNOSIS — C3491 Malignant neoplasm of unspecified part of right bronchus or lung: Secondary | ICD-10-CM | POA: Diagnosis not present

## 2020-08-30 DIAGNOSIS — R2681 Unsteadiness on feet: Secondary | ICD-10-CM | POA: Diagnosis not present

## 2020-08-30 DIAGNOSIS — K922 Gastrointestinal hemorrhage, unspecified: Secondary | ICD-10-CM | POA: Diagnosis not present

## 2020-08-30 DIAGNOSIS — R7881 Bacteremia: Secondary | ICD-10-CM | POA: Diagnosis not present

## 2020-08-30 DIAGNOSIS — R262 Difficulty in walking, not elsewhere classified: Secondary | ICD-10-CM | POA: Diagnosis not present

## 2020-08-30 DIAGNOSIS — J449 Chronic obstructive pulmonary disease, unspecified: Secondary | ICD-10-CM | POA: Diagnosis not present

## 2020-08-30 DIAGNOSIS — G9341 Metabolic encephalopathy: Secondary | ICD-10-CM | POA: Diagnosis not present

## 2020-08-30 DIAGNOSIS — A080 Rotaviral enteritis: Secondary | ICD-10-CM | POA: Diagnosis not present

## 2020-08-30 DIAGNOSIS — I10 Essential (primary) hypertension: Secondary | ICD-10-CM | POA: Diagnosis not present

## 2020-08-30 DIAGNOSIS — I4891 Unspecified atrial fibrillation: Secondary | ICD-10-CM | POA: Diagnosis not present

## 2020-08-30 DIAGNOSIS — J9600 Acute respiratory failure, unspecified whether with hypoxia or hypercapnia: Secondary | ICD-10-CM | POA: Diagnosis not present

## 2020-08-30 LAB — ALPHA-1 ANTITRYPSIN PHENOTYPE: A-1 Antitrypsin, Ser: 208 mg/dL — ABNORMAL HIGH (ref 101–187)

## 2020-08-31 ENCOUNTER — Telehealth: Payer: Self-pay | Admitting: Cardiovascular Disease

## 2020-08-31 DIAGNOSIS — J189 Pneumonia, unspecified organism: Secondary | ICD-10-CM | POA: Diagnosis not present

## 2020-08-31 DIAGNOSIS — J449 Chronic obstructive pulmonary disease, unspecified: Secondary | ICD-10-CM | POA: Diagnosis not present

## 2020-08-31 DIAGNOSIS — J9611 Chronic respiratory failure with hypoxia: Secondary | ICD-10-CM | POA: Diagnosis not present

## 2020-08-31 DIAGNOSIS — G4720 Circadian rhythm sleep disorder, unspecified type: Secondary | ICD-10-CM | POA: Diagnosis not present

## 2020-08-31 LAB — ACETYLCHOLINE RECEPTOR AB, ALL
Acety choline binding ab: <0.03 nmol/L (ref 0.00–0.24)
Acetylchol Block Ab: 14 % (ref 0–25)

## 2020-08-31 NOTE — Telephone Encounter (Signed)
4.12.22 Spoke to daughter [name] to schedule fu w/Dr. Oval Linsey. Pt is now at [facility], post hospitalization. Can not reschedule at this time. LP

## 2020-09-01 ENCOUNTER — Ambulatory Visit (INDEPENDENT_AMBULATORY_CARE_PROVIDER_SITE_OTHER): Payer: Medicare Other

## 2020-09-01 ENCOUNTER — Other Ambulatory Visit: Payer: Self-pay | Admitting: *Deleted

## 2020-09-01 ENCOUNTER — Ambulatory Visit (INDEPENDENT_AMBULATORY_CARE_PROVIDER_SITE_OTHER): Payer: Medicare Other | Admitting: Acute Care

## 2020-09-01 ENCOUNTER — Encounter: Payer: Self-pay | Admitting: Acute Care

## 2020-09-01 ENCOUNTER — Other Ambulatory Visit: Payer: Self-pay

## 2020-09-01 VITALS — BP 122/78 | HR 91 | Temp 97.5°F | Ht 72.0 in | Wt 199.0 lb

## 2020-09-01 DIAGNOSIS — I4891 Unspecified atrial fibrillation: Secondary | ICD-10-CM

## 2020-09-01 DIAGNOSIS — C3490 Malignant neoplasm of unspecified part of unspecified bronchus or lung: Secondary | ICD-10-CM | POA: Diagnosis not present

## 2020-09-01 DIAGNOSIS — J9611 Chronic respiratory failure with hypoxia: Secondary | ICD-10-CM

## 2020-09-01 DIAGNOSIS — K219 Gastro-esophageal reflux disease without esophagitis: Secondary | ICD-10-CM

## 2020-09-01 DIAGNOSIS — J9612 Chronic respiratory failure with hypercapnia: Secondary | ICD-10-CM

## 2020-09-01 DIAGNOSIS — J449 Chronic obstructive pulmonary disease, unspecified: Secondary | ICD-10-CM

## 2020-09-01 DIAGNOSIS — J90 Pleural effusion, not elsewhere classified: Secondary | ICD-10-CM | POA: Diagnosis not present

## 2020-09-01 NOTE — Progress Notes (Signed)
History of Present Illness Todd Lloyd is a 80 y.o. male former smoker ( quit 2002 with an 86 pack year smoking history) with history of COPD, stage I non-small cell lung cancer s/p radiation in 04/2020, dementia, paroxysmal A. fib on Eliquis, GERD, leukemia s/p bone marrow transplantation in 1990.    Synopsis 80 year old male patient just discharged from the hospital as mentioned below on 4/1.  Followed by [place] for chronic respiratory failure in the setting of COPD, and also history of stage I non-small cell lung cancer status post radiation therapy.  Was discharged to skilled nursing facility awake, oriented, with all mental faculties however was quite deconditioned requiring extensive physical therapy.  Per his daughter about 2 days after arrival to the skilled nursing facility she began to notice he was more fatigued, he stopped calling his family as frequently as usual, and on questioning him he reported worsening "hard day" over the last day or so.  He denied new fever, no significant worsening cough, no wheezing.  No lower extremity edema.  On 4/4 family found him minimally responsive, essentially head slumped over and drooling.  Family had to insist on he be evaluated in the emergency room.  In transport he was placed on supplemental oxygen, then subsequently placed on BiPAP.  Was found to have significant compensated hypercarbic respiratory failure but clinically improved with BiPAP.  Was discharged on BiPAP to [facility]. The staff there have just been able to use the BiPAP machine last night ( 4/13)  for the first time since discharge.    Admission date:  08/23/2020  Discharge Date:  08/27/2020    4/4 admitted.  Placed on broad-spectrum antibiotics, IV systemic steroids, BiPAP initial arterial blood gas pH 7.399, PCO2 81 PO2 78 bicarbonate 50  4/5 pH 7.42, PCO2 79, bicarbonate 51, PO2 60, with resolution of encephalopathy    Admitted with Acute hypoxic on chronic hypercarbic  respiratory failure -Secondary to what looks right lower lobe pneumonia, and COPD exacerbation  Acute Metabolicencephalopathy  -Significantly improved, most likely due to both hypoxia and hypercarbia, possibly due to infection . -This has significantly improved,patient back to baseline -Avoid narcotics . Encouraged the patient to sit up in chair in the daytime use I-S and flutter valve for pulmonary toiletry. Will advance activity and titrate down oxygen as possible.    09/01/2020  Pt. Presents for hospital follow up. He states he has been doing well. The patient was discharged with a BiPAP machine . He is at a SNF and the staff have had a challenge setting up the machine. He states he did use his BiPAP machine for the first time last night. He denies waking up with a headache. He states his breathing is better. He has completed the antibiotic and prednisone . He is compliant with his inhalers. He rarely has to use his rescue inhaler. Per the report of the PT at [facility], he drops his oxygen saturations to 83-87% with activity. Very poor activity tolerance most likely related to his physical deconditioning. He denies fever, chest pain ,orthopnea or hemoptysis.He does not like the BiPAP machine. I have explained that this is a therapy he will need to use every night without fail for the rest of his life, and that his other option is comfort care. We will get a CXR today to ensure improvement of infilltrate.  Test Results:  CXR 08/23/2020 The appearance of the chest is concerning for bronchitis with developing multilobar bronchopneumonia. 2. Trace right pleural  effusion. 3. Mild cardiomegaly. 4. Aortic atherosclerosis.  08/24/2020 Arterial Blood Gas result:  PO2 60 ; pCO2 78.7; pH 7.41;  HCO3  51, %O2 Sat 90%.           CBC Latest Ref Rng & Units 08/27/2020 08/26/2020 08/25/2020  WBC 4.0 - 10.5 K/uL 8.7 6.3 4.9  Hemoglobin 13.0 - 17.0 g/dL 7.9(L) 7.6(L) 8.1(L)  Hematocrit 39.0 - 52.0  % 26.2(L) 25.1(L) 27.2(L)  Platelets 150 - 400 K/uL 147(L) 141(L) 161    BMP Latest Ref Rng & Units 08/27/2020 08/26/2020 08/25/2020  Glucose 70 - 99 mg/dL 105(H) 146(H) 120(H)  BUN 8 - 23 mg/dL 19 18 20   Creatinine 0.61 - 1.24 mg/dL 1.33(H) 1.20 1.13  BUN/Creat Ratio 10 - 24 - - -  Sodium 135 - 145 mmol/L 142 140 139  Potassium 3.5 - 5.1 mmol/L 3.8 4.3 4.1  Chloride 98 - 111 mmol/L 92(L) 91(L) 89(L)  CO2 22 - 32 mmol/L 40(H) 44(H) 46(H)  Calcium 8.9 - 10.3 mg/dL 9.0 8.7(L) 8.6(L)    BNP    Component Value Date/Time   BNP 490.4 (H) 08/27/2020 0151    ProBNP    Component Value Date/Time   PROBNP 177.0 (H) 10/30/2018 1413    PFT    Component Value Date/Time   FEV1PRE 0.86 08/25/2020 1320   FEV1POST 1.67 04/29/2015 1248   FVCPRE 2.03 08/25/2020 1320   FVCPOST 2.82 04/29/2015 1248   TLC 6.62 04/29/2015 1248   DLCOUNC 5.01 08/25/2020 1320   PREFEV1FVCRT 42 08/25/2020 1320   PSTFEV1FVCRT 59 04/29/2015 1248    CT ABDOMEN PELVIS WO CONTRAST  Result Date: 08/10/2020 CLINICAL DATA:  Anemia, colitis. EXAM: CT ABDOMEN AND PELVIS WITHOUT CONTRAST TECHNIQUE: Multidetector CT imaging of the abdomen and pelvis was performed following the standard protocol without IV contrast. COMPARISON:  July 26, 2020. FINDINGS: Lower chest: Bilateral pleural effusions are noted with adjacent subsegmental atelectasis. Hepatobiliary: No focal liver abnormality is seen. No gallstones, gallbladder wall thickening, or biliary dilatation. Pancreas: Unremarkable. No pancreatic ductal dilatation or surrounding inflammatory changes. Spleen: Normal in size without focal abnormality. Adrenals/Urinary Tract: Stable large calcification involving left adrenal gland consistent with old hematoma. Right adrenal gland appears normal. Small nonobstructive left renal calculus is noted. No hydronephrosis or renal obstruction is noted. Urinary bladder is decompressed secondary to Foley catheter. Stomach/Bowel: The stomach appears  normal. There is no evidence of bowel obstruction. Status post appendectomy. There remains wall thickening involving the proximal os ascending colon, but it appears to be significantly improved compared to prior exam. Vascular/Lymphatic: Aortic atherosclerosis. No enlarged abdominal or pelvic lymph nodes. Reproductive: Prostate is unremarkable. Other: Mild anasarca is noted.  No ascites or hernia is noted. Musculoskeletal: No acute or significant osseous findings. IMPRESSION: 1. Bilateral pleural effusions are noted with adjacent subsegmental atelectasis. 2. Stable large calcification involving left adrenal gland consistent with old hematoma. 3. Small nonobstructive left renal calculus. No hydronephrosis or renal obstruction is noted. 4. There remains wall thickening involving the proximal ascending colon, but it appears to be significantly improved compared to prior exam. 5. Mild anasarca is noted. 6. Aortic atherosclerosis. Aortic Atherosclerosis (ICD10-I70.0). Electronically Signed   By: Marijo Conception M.D.   On: 08/10/2020 19:25   CT HEAD WO CONTRAST  Result Date: 08/24/2020 CLINICAL DATA:  Delirium, acute metabolic encephalopathy EXAM: CT HEAD WITHOUT CONTRAST TECHNIQUE: Contiguous axial images were obtained from the base of the skull through the vertex without intravenous contrast. COMPARISON:  Recent prior CT scan of  the head 08/05/2020 FINDINGS: Brain: No evidence of acute infarction, hemorrhage, hydrocephalus, extra-axial collection or mass lesion/mass effect. Stable mild cerebral and cerebellar cortical atrophy. Trace chronic microvascular ischemic white matter changes. Vascular: No hyperdense vessel or unexpected calcification. Skull: Normal. Negative for fracture or focal lesion. Sinuses/Orbits: No acute finding.  Bilateral scleral banding. Other: None. IMPRESSION: 1. No acute intracranial abnormality. 2. Stable mild atrophy and chronic microvascular ischemic white matter changes. Electronically  Signed   By: Jacqulynn Cadet M.D.   On: 08/24/2020 11:48   CT HEAD WO CONTRAST  Result Date: 08/05/2020 CLINICAL DATA:  Expressive aphasia with stroke suspected EXAM: CT HEAD WITHOUT CONTRAST TECHNIQUE: Contiguous axial images were obtained from the base of the skull through the vertex without intravenous contrast. COMPARISON:  Ten days ago FINDINGS: Brain: No evidence of acute infarction, hemorrhage, hydrocephalus, extra-axial collection or mass lesion/mass effect. Generalized atrophy. Age normal appearance of white matter. Vascular: No hyperdense vessel or unexpected calcification. Skull: Normal. Negative for fracture or focal lesion. Sinuses/Orbits: No acute finding. Bilateral scleral banding and cataract resection. IMPRESSION: Stable head CT. No visible infarct or other change from recent comparison. Electronically Signed   By: Monte Fantasia M.D.   On: 08/05/2020 05:01   DG Chest Port 1 View  Result Date: 08/24/2020 CLINICAL DATA:  Shortness of breath EXAM: PORTABLE CHEST 1 VIEW COMPARISON:  August 23, 2020 FINDINGS: Ill-defined airspace opacity right base is stable. Visualized lung otherwise clear. Note that a small portion of the lateral left base is not imaged. Heart size and pulmonary vascular normal. No adenopathy. There is aortic atherosclerosis. No bone lesions. IMPRESSION: Ill-defined opacity right base which may represent early developing pneumonia. Appearance stable compared to 1 day prior. Lungs elsewhere clear. Heart size normal. Aortic Atherosclerosis (ICD10-I70.0). Electronically Signed   By: Lowella Grip III M.D.   On: 08/24/2020 08:08   DG Chest Port 1 View  Result Date: 08/23/2020 CLINICAL DATA:  80 year old male with history of dyspnea and altered mental status. EXAM: PORTABLE CHEST 1 VIEW COMPARISON:  Chest x-ray 07/30/2020. FINDINGS: Lung volumes are low. Diffuse peribronchial cuffing and scattered areas of ill-defined interstitial prominence noted throughout the lungs  bilaterally, most evident in the medial aspect of the right upper lobe and medial aspect of the lower right lung. Trace right pleural effusion. No left pleural effusion. No pneumothorax. No evidence of pulmonary edema. Mild cardiomegaly. Upper mediastinal contours are within normal limits. Aortic atherosclerosis. IMPRESSION: 1. The appearance of the chest is concerning for bronchitis with developing multilobar bronchopneumonia. 2. Trace right pleural effusion. 3. Mild cardiomegaly. 4. Aortic atherosclerosis. Electronically Signed   By: Vinnie Langton M.D.   On: 08/23/2020 19:35   EEG adult  Result Date: 08/25/2020 Lora Havens, MD     08/25/2020  8:14 AM Patient Name: INA POUPARD MRN: [id number] Epilepsy Attending: Lora Havens Referring Physician/Provider: Dr Lala Lund Date: 08/24/2020 Duration: 23.17 mins Patient history: 80 year old male with altered mental status. EEG to evaluate for seizures. Level of alertness: Awake AEDs during EEG study: None Technical aspects: This EEG study was done with scalp electrodes positioned according to the 10-20 International system of electrode placement. Electrical activity was acquired at a sampling rate of 500Hz  and reviewed with a high frequency filter of 70Hz  and a low frequency filter of 1Hz . EEG data were recorded continuously and digitally stored. Description: No posterior dominant rhythm was seen. EEG showed continuous generalized polymorphic mixed frequencies with predominantly 3 to 6 Hz theta-delta slowing. Hyperventilation  and photic stimulation were not performed.   ABNORMALITY - Continuous slow, generalized IMPRESSION: This study is suggestive of moderate diffuse encephalopathy, nonspecific etiology. No seizures or epileptiform discharges were seen throughout the recording. Priyanka O Yadav   VAS [place] LOWER EXTREMITY VENOUS (DVT)  Result Date: 08/09/2020  Lower Venous DVT Study Indications: Swelling, and Pain.  Comparison Study: No prior study  Performing Technologist: Sharion Dove RVS  Examination Guidelines: A complete evaluation includes B-mode imaging, spectral Doppler, color Doppler, and power Doppler as needed of all accessible portions of each vessel. Bilateral testing is considered an integral part of a complete examination. Limited examinations for reoccurring indications may be performed as noted. The reflux portion of the exam is performed with the patient in reverse Trendelenburg.  +-----+---------------+---------+-----------+----------+--------------+ RIGHTCompressibilityPhasicitySpontaneityPropertiesThrombus Aging +-----+---------------+---------+-----------+----------+--------------+ CFV  Full           Yes      Yes                                 +-----+---------------+---------+-----------+----------+--------------+   +---------+---------------+---------+-----------+----------+--------------+ LEFT     CompressibilityPhasicitySpontaneityPropertiesThrombus Aging +---------+---------------+---------+-----------+----------+--------------+ CFV      Full           Yes      Yes                                 +---------+---------------+---------+-----------+----------+--------------+ SFJ      Full                                                        +---------+---------------+---------+-----------+----------+--------------+ FV Prox  Full                                                        +---------+---------------+---------+-----------+----------+--------------+ FV Mid   Full                                                        +---------+---------------+---------+-----------+----------+--------------+ FV DistalFull                                                        +---------+---------------+---------+-----------+----------+--------------+ PFV      Full                                                         +---------+---------------+---------+-----------+----------+--------------+ POP      Full           Yes      Yes                                 +---------+---------------+---------+-----------+----------+--------------+  PTV      Full                                                        +---------+---------------+---------+-----------+----------+--------------+ PERO     Full                                                        +---------+---------------+---------+-----------+----------+--------------+     Summary: RIGHT: - No evidence of common femoral vein obstruction.  LEFT: - There is no evidence of deep vein thrombosis in the lower extremity.  *See table(s) above for measurements and observations. Electronically signed by Servando Snare MD on 08/09/2020 at 11:19:19 AM.    Final      Past medical hx Past Medical History:  Diagnosis Date  . Acid reflux   . Atrial fibrillation ([place])   . Chronic low back pain 07/16/2018  . Colon polyps   . COPD (chronic obstructive pulmonary disease) ([place])   . Dysrhythmia    afib  . Gait abnormality 09/12/2016  . High cholesterol   . Hypertension   . Leukemia ([place])    s/p bone marrow transplant in 1990  . Memory difficulties 09/12/2016  . Peripheral neuropathy 10/18/2016  . RLS (restless legs syndrome) 10/18/2016     Social History   Tobacco Use  . Smoking status: Former Smoker    Packs/day: 2.00    Years: 43.00    Pack years: 86.00    Types: Cigarettes    Quit date: 05/22/2000    Years since quitting: 20.2  . Smokeless tobacco: Never Used  . Tobacco comment: Counseled to remain smoke free  Substance Use Topics  . Alcohol use: No    Alcohol/week: 0.0 standard drinks  . Drug use: No    Mr.Lysne reports that he quit smoking about 20 years ago. His smoking use included cigarettes. He has a 86.00 pack-year smoking history. He has never used smokeless tobacco. He reports that he does not drink alcohol and does not use  drugs.  Tobacco Cessation: Former smoker with an 86 pack year smoking history, Quit 2002  Past surgical hx, Family hx, Social hx all reviewed.  Current Outpatient Medications on File Prior to Visit  Medication Sig  . albuterol (VENTOLIN HFA) 108 (90 Base) MCG/ACT inhaler Inhale 2 puffs into the lungs every 6 (six) hours as needed for wheezing or shortness of breath.  Marland Kitchen amoxicillin-clavulanate (AUGMENTIN) 875-125 MG tablet Take 1 tablet by mouth every 12 (twelve) hours.  Marland Kitchen apixaban (ELIQUIS) 5 MG TABS tablet Take 1 tablet (5 mg total) by mouth 2 (two) times daily.  Marland Kitchen arformoterol (BROVANA) 15 MCG/2ML NEBU Take 2 mLs (15 mcg total) by nebulization 2 (two) times daily. Dx: J44.9, file under Part B  . digoxin (LANOXIN) 0.25 MG tablet Take 0.25 mg by mouth every morning. For afib  . donepezil (ARICEPT) 10 MG tablet Take 10 mg by mouth at bedtime. For dementia  . furosemide (LASIX) 40 MG tablet Take 1 tablet (40 mg total) by mouth daily as needed for fluid or edema.  . memantine (NAMENDA) 5 MG tablet Take 5 mg by mouth 2 (two) times daily.  For dementia  . metoprolol tartrate (LOPRESSOR) 25 MG tablet Take 0.5 tablets (12.5 mg total) by mouth 2 (two) times daily.  . mometasone (ASMANEX) 220 MCG/INH inhaler Inhale 2 puffs into the lungs daily.  . pantoprazole (PROTONIX) 40 MG tablet Take 1 tablet (40 mg total) by mouth daily. (Patient taking differently: Take 40 mg by mouth daily at 6 (six) AM.)  . pramipexole (MIRAPEX) 1 MG tablet Take 1 tablet (1 mg total) by mouth 2 (two) times daily. (Patient taking differently: Take 1 mg by mouth 2 (two) times daily. For parkinson's)  . predniSONE (DELTASONE) 10 MG tablet Please get prednisone 40 mg oral daily x2 days, then 30 mg oral daily x2 days, then 20 mg oral daily x then stop.  Then 10 mg oral daily  . Tiotropium Bromide-Olodaterol (STIOLTO RESPIMAT) 2.5-2.5 MCG/ACT AERS Inhale 2 puffs into the lungs daily.   No current facility-administered medications on  file prior to visit.     Allergies  Allergen Reactions  . Lyrica [Pregabalin] Other (See Comments)    confusion  . Morphine And Related Other (See Comments)    confusion  . Simvastatin Other (See Comments)    Muscle pain - reported by [facility] 2019    Review Of Systems:  Constitutional:   No  weight loss, night sweats,  Fevers, chills, + fatigue, or  lassitude.  HEENT:   No headaches,  Difficulty swallowing,  Tooth/dental problems, or  Sore throat,                No sneezing, itching, ear ache, nasal congestion, post nasal drip,   CV:  No chest pain,  Orthopnea, PND, + swelling in lower extremities,No nasarca, dizziness, palpitations, syncope.   GI  No heartburn, indigestion, abdominal pain, nausea, vomiting, diarrhea, change in bowel habits, loss of appetite, bloody stools.   Resp: + shortness of breath with exertion less at rest.  No excess mucus, no productive cough,  No non-productive cough,  No coughing up of blood.  No change in color of mucus.  No wheezing.  No chest wall deformity  Skin: no rash or lesions.  GU: no dysuria, change in color of urine, no urgency or frequency.  No flank pain, no hematuria   MS:  No joint pain or swelling.  No decreased range of motion.  No back pain.  Psych:  No change in mood or affect. No depression or anxiety.  No memory loss.   Vital Signs BP 122/78 (BP Location: Right Arm, Cuff Size: Normal)   Pulse 91   Temp (!) 97.5 F (36.4 C)   Ht 6' (1.829 m)   Wt 199 lb (90.3 kg)   SpO2 93%   BMI 26.99 kg/m    Physical Exam:  General- No distress,  A&Ox3, pleasant ENT: No sinus tenderness, TM clear, pale nasal mucosa, no oral exudate,no post nasal drip, no LAN Cardiac: S1, S2, regular rate and rhythm, no murmur Chest: No wheeze/ rales/ dullness; no accessory muscle use, no nasal flaring, no sternal retractions, decreased per bases Abd.: Soft Non-tender, ND, BS +, Body mass index is 26.99 kg/m. Ext: No clubbing cyanosis,2 + BLE edema  per Left , 1+ BLE per right  Neuro:  Physical deconditioning, MAE x 3, A&O x 4, Wheel Chair on today's visit Skin: No rashes, warm and dry Psych: normal mood and behavior   Assessment/Plan Acute hypoxic and chronic hypercarbic respiratory failure: Secondary to  right lower lobe pneumonia / COPD exacerbation.   He  had chronic respiratory acidosis on his arterial blood gas, there also was a component of contraction alkalosis.  But to a greater extent simply muscular deconditioning, and poor endurance following his recent acute illness He is DO NOT RESUSCITATE & overall w/ very poor prognosis Plan Augmentin would treat for at least 5 days Taper steroids SLP evaluation We will change bronchodilators to nebulized therapies Would continue BiPAP at at bedtime and as needed.   He has had several episodes during his last hospitalization where his mental status did the same, he would get confused and less responsive, be placed on BiPAP and improve. If he cannot use BiPAP we may need to transition to comfort OOB to chair in the daytime use I-S and flutter valve for pulmonary toiletry.  Advance activity and titrate down oxygen as possible for sat goal of 88-92%.  If you get confused or sleepy, please seek emergency care with ABG to evaluate your CO2 level, and check pH  Acute metabolic encephalopathy Multifactorial,  initially was hypoxic suspect  a mix of both hypoxia, hypercarbia, and  Infection. Very low threshold for confusion with hypercapnea Treated with broad-spectrum antibiotics, IV systemic steroids, BiPAP Discharged on Augmentin ( Completed a 5 day treatment ) Plan Avoid narcotics Continue discharge antibiotics until gone BiPAP at night and as needed Call for fever of change in breathing , change in color of secretions   Atrial fibrillation Plan Rate control  History of non-small cell lung cancer status post radiation therapy Plan Follow-up ONC   History of gastric reflux  disease Plan PPI    Magdalen Spatz, NP 09/01/2020  3:20 PM

## 2020-09-01 NOTE — Patient Outreach (Signed)
Member screened for potential [facility] Care Management needs. [name] is receiving skilled therapy at [facility].   Update received from [place] indicating care plan meeting scheduled for today. Will have more information after meeting.  Will continue to follow for transition plans and potential [facility] needs while member resides in SNF.    Marthenia Rolling, MSN, RN,BSN [place] Acute Care Coordinator [phone] [facility]) [phone]  (Toll free office)

## 2020-09-01 NOTE — Patient Instructions (Addendum)
It is good to see you today. Please wear your BiPAP every night Wear it when you are in bed, or when you take a mask.  Continue your Stiolto 2 puff in the morning. Continue the Asthmanex 2 puffs at night Rinse mouth after use. Continue the Albuterol rescue medication as needed for shortness of breath or wheezing.  Continue wearing your oxygen 24/7 at 2 L [place]. Saturation goals are 88-92% Follow up With Dr. Oval Linsey, cardiology. Continue to work on slowly increasing your physical activity with Physical therapy. CXR today. We will call you with results Note your daily symptoms > remember "red flags" for COPD:  Increase in cough, increase in sputum production, increase in shortness of breath or activity intolerance. If you notice these symptoms, please call to be seen.   Note symptoms of CO2 retention. Headache, sleepiness, confusion. If you experience these, get on your BiPAP and seek emergency care.     Continue on BiPAP at bedtime. You appear to be benefiting from the treatment  Goal is to wear for at least 6 hours each night for maximal clinical benefit. Continue to work on weight loss, as the link between excess weight  and sleep apnea is well established.   Remember to establish a good bedtime routine, and work on sleep hygiene.  Limit daytime naps , avoid stimulants such as caffeine and nicotine close to bedtime, exercise daily to promote sleep quality, avoid heavy , spicy, fried , or rich foods before bed. Ensure adequate exposure to natural light during the day,establish a relaxing bedtime routine with a pleasant sleep environment ( Bedroom between 60 and 67 degrees, turn off bright lights , TV or device screens screens , consider black out curtains or white noise machines) Do not drive if sleepy. Remember to clean mask, tubing, filter, and reservoir once weekly with soapy water.  Follow up with Dr.  Halford Chessman or Judson Roch NP  In 3 months  or before as needed.   Down Load at follow up.  Please  contact office for sooner follow up if symptoms do not improve or worsen or seek emergency care

## 2020-09-02 DIAGNOSIS — R7881 Bacteremia: Secondary | ICD-10-CM | POA: Diagnosis not present

## 2020-09-02 DIAGNOSIS — J9600 Acute respiratory failure, unspecified whether with hypoxia or hypercapnia: Secondary | ICD-10-CM | POA: Diagnosis not present

## 2020-09-02 DIAGNOSIS — D649 Anemia, unspecified: Secondary | ICD-10-CM | POA: Diagnosis not present

## 2020-09-02 DIAGNOSIS — I10 Essential (primary) hypertension: Secondary | ICD-10-CM | POA: Diagnosis not present

## 2020-09-02 DIAGNOSIS — G9341 Metabolic encephalopathy: Secondary | ICD-10-CM | POA: Diagnosis not present

## 2020-09-02 DIAGNOSIS — J449 Chronic obstructive pulmonary disease, unspecified: Secondary | ICD-10-CM | POA: Diagnosis not present

## 2020-09-02 DIAGNOSIS — C3491 Malignant neoplasm of unspecified part of right bronchus or lung: Secondary | ICD-10-CM | POA: Diagnosis not present

## 2020-09-02 DIAGNOSIS — R2681 Unsteadiness on feet: Secondary | ICD-10-CM | POA: Diagnosis not present

## 2020-09-02 DIAGNOSIS — I4891 Unspecified atrial fibrillation: Secondary | ICD-10-CM | POA: Diagnosis not present

## 2020-09-02 DIAGNOSIS — A080 Rotaviral enteritis: Secondary | ICD-10-CM | POA: Diagnosis not present

## 2020-09-02 DIAGNOSIS — R262 Difficulty in walking, not elsewhere classified: Secondary | ICD-10-CM | POA: Diagnosis not present

## 2020-09-02 DIAGNOSIS — K922 Gastrointestinal hemorrhage, unspecified: Secondary | ICD-10-CM | POA: Diagnosis not present

## 2020-09-06 DIAGNOSIS — R7881 Bacteremia: Secondary | ICD-10-CM | POA: Diagnosis not present

## 2020-09-06 DIAGNOSIS — G9341 Metabolic encephalopathy: Secondary | ICD-10-CM | POA: Diagnosis not present

## 2020-09-06 DIAGNOSIS — C3491 Malignant neoplasm of unspecified part of right bronchus or lung: Secondary | ICD-10-CM | POA: Diagnosis not present

## 2020-09-06 DIAGNOSIS — D649 Anemia, unspecified: Secondary | ICD-10-CM | POA: Diagnosis not present

## 2020-09-06 DIAGNOSIS — I4891 Unspecified atrial fibrillation: Secondary | ICD-10-CM | POA: Diagnosis not present

## 2020-09-06 DIAGNOSIS — K922 Gastrointestinal hemorrhage, unspecified: Secondary | ICD-10-CM | POA: Diagnosis not present

## 2020-09-06 DIAGNOSIS — J449 Chronic obstructive pulmonary disease, unspecified: Secondary | ICD-10-CM | POA: Diagnosis not present

## 2020-09-06 DIAGNOSIS — I10 Essential (primary) hypertension: Secondary | ICD-10-CM | POA: Diagnosis not present

## 2020-09-06 DIAGNOSIS — J9600 Acute respiratory failure, unspecified whether with hypoxia or hypercapnia: Secondary | ICD-10-CM | POA: Diagnosis not present

## 2020-09-06 DIAGNOSIS — R262 Difficulty in walking, not elsewhere classified: Secondary | ICD-10-CM | POA: Diagnosis not present

## 2020-09-06 DIAGNOSIS — R2681 Unsteadiness on feet: Secondary | ICD-10-CM | POA: Diagnosis not present

## 2020-09-06 DIAGNOSIS — A080 Rotaviral enteritis: Secondary | ICD-10-CM | POA: Diagnosis not present

## 2020-09-07 DIAGNOSIS — G9341 Metabolic encephalopathy: Secondary | ICD-10-CM | POA: Diagnosis not present

## 2020-09-07 DIAGNOSIS — J9611 Chronic respiratory failure with hypoxia: Secondary | ICD-10-CM | POA: Diagnosis not present

## 2020-09-07 DIAGNOSIS — J180 Bronchopneumonia, unspecified organism: Secondary | ICD-10-CM | POA: Diagnosis not present

## 2020-09-07 DIAGNOSIS — K922 Gastrointestinal hemorrhage, unspecified: Secondary | ICD-10-CM | POA: Diagnosis not present

## 2020-09-07 LAB — POCT I-STAT, CHEM 8
BUN: 24 mg/dL — ABNORMAL HIGH (ref 8–23)
Calcium, Ion: 1.09 mmol/L — ABNORMAL LOW (ref 1.15–1.40)
Chloride: 85 mmol/L — ABNORMAL LOW (ref 98–111)
Creatinine, Ser: 1.3 mg/dL — ABNORMAL HIGH (ref 0.61–1.24)
Glucose, Bld: 84 mg/dL (ref 70–99)
HCT: 29 % — ABNORMAL LOW (ref 39.0–52.0)
Hemoglobin: 9.9 g/dL — ABNORMAL LOW (ref 13.0–17.0)
Potassium: 3.1 mmol/L — ABNORMAL LOW (ref 3.5–5.1)
Sodium: 141 mmol/L (ref 135–145)
TCO2: 45 mmol/L — ABNORMAL HIGH (ref 22–32)

## 2020-09-07 LAB — I-STAT BETA HCG BLOOD, ED (NOT ORDERABLE): I-stat hCG, quantitative: 158.2 m[IU]/mL — ABNORMAL HIGH (ref <5)

## 2020-09-09 DIAGNOSIS — C3491 Malignant neoplasm of unspecified part of right bronchus or lung: Secondary | ICD-10-CM | POA: Diagnosis not present

## 2020-09-09 DIAGNOSIS — R2681 Unsteadiness on feet: Secondary | ICD-10-CM | POA: Diagnosis not present

## 2020-09-09 DIAGNOSIS — D649 Anemia, unspecified: Secondary | ICD-10-CM | POA: Diagnosis not present

## 2020-09-09 DIAGNOSIS — G9341 Metabolic encephalopathy: Secondary | ICD-10-CM | POA: Diagnosis not present

## 2020-09-09 DIAGNOSIS — R7881 Bacteremia: Secondary | ICD-10-CM | POA: Diagnosis not present

## 2020-09-09 DIAGNOSIS — I4891 Unspecified atrial fibrillation: Secondary | ICD-10-CM | POA: Diagnosis not present

## 2020-09-09 DIAGNOSIS — J9600 Acute respiratory failure, unspecified whether with hypoxia or hypercapnia: Secondary | ICD-10-CM | POA: Diagnosis not present

## 2020-09-09 DIAGNOSIS — A080 Rotaviral enteritis: Secondary | ICD-10-CM | POA: Diagnosis not present

## 2020-09-09 DIAGNOSIS — R262 Difficulty in walking, not elsewhere classified: Secondary | ICD-10-CM | POA: Diagnosis not present

## 2020-09-09 DIAGNOSIS — J449 Chronic obstructive pulmonary disease, unspecified: Secondary | ICD-10-CM | POA: Diagnosis not present

## 2020-09-09 DIAGNOSIS — I10 Essential (primary) hypertension: Secondary | ICD-10-CM | POA: Diagnosis not present

## 2020-09-09 DIAGNOSIS — K922 Gastrointestinal hemorrhage, unspecified: Secondary | ICD-10-CM | POA: Diagnosis not present

## 2020-09-09 DIAGNOSIS — N179 Acute kidney failure, unspecified: Secondary | ICD-10-CM | POA: Diagnosis not present

## 2020-09-10 ENCOUNTER — Ambulatory Visit (INDEPENDENT_AMBULATORY_CARE_PROVIDER_SITE_OTHER): Payer: Medicare Other | Admitting: Gastroenterology

## 2020-09-10 ENCOUNTER — Encounter: Payer: Self-pay | Admitting: Gastroenterology

## 2020-09-10 ENCOUNTER — Other Ambulatory Visit: Payer: Self-pay

## 2020-09-10 ENCOUNTER — Other Ambulatory Visit (INDEPENDENT_AMBULATORY_CARE_PROVIDER_SITE_OTHER): Payer: Medicare Other

## 2020-09-10 VITALS — BP 108/62 | HR 65 | Ht 72.0 in | Wt 195.2 lb

## 2020-09-10 DIAGNOSIS — K449 Diaphragmatic hernia without obstruction or gangrene: Secondary | ICD-10-CM

## 2020-09-10 DIAGNOSIS — J449 Chronic obstructive pulmonary disease, unspecified: Secondary | ICD-10-CM | POA: Diagnosis not present

## 2020-09-10 DIAGNOSIS — K219 Gastro-esophageal reflux disease without esophagitis: Secondary | ICD-10-CM

## 2020-09-10 DIAGNOSIS — J189 Pneumonia, unspecified organism: Secondary | ICD-10-CM | POA: Diagnosis not present

## 2020-09-10 DIAGNOSIS — G4733 Obstructive sleep apnea (adult) (pediatric): Secondary | ICD-10-CM | POA: Diagnosis not present

## 2020-09-10 DIAGNOSIS — Z8719 Personal history of other diseases of the digestive system: Secondary | ICD-10-CM | POA: Diagnosis not present

## 2020-09-10 DIAGNOSIS — J9692 Respiratory failure, unspecified with hypercapnia: Secondary | ICD-10-CM | POA: Diagnosis not present

## 2020-09-10 LAB — COMPREHENSIVE METABOLIC PANEL
ALT: 14 U/L (ref 0–53)
AST: 18 U/L (ref 0–37)
Albumin: 3.6 g/dL (ref 3.5–5.2)
Alkaline Phosphatase: 75 U/L (ref 39–117)
BUN: 10 mg/dL (ref 6–23)
CO2: 41 mEq/L — ABNORMAL HIGH (ref 19–32)
Calcium: 8.9 mg/dL (ref 8.4–10.5)
Chloride: 98 mEq/L (ref 96–112)
Creatinine, Ser: 0.97 mg/dL (ref 0.40–1.50)
GFR: 73.57 mL/min (ref >60.00)
Glucose, Bld: 76 mg/dL (ref 70–99)
Potassium: 4.3 mEq/L (ref 3.5–5.1)
Sodium: 143 mEq/L (ref 135–145)
Total Bilirubin: 0.6 mg/dL (ref 0.2–1.2)
Total Protein: 5.6 g/dL — ABNORMAL LOW (ref 6.0–8.3)

## 2020-09-10 LAB — CBC WITH DIFFERENTIAL/PLATELET
Basophils Absolute: 0 10*3/uL (ref 0.0–0.1)
Basophils Relative: 0.4 % (ref 0.0–3.0)
Eosinophils Absolute: 0.1 10*3/uL (ref 0.0–0.7)
Eosinophils Relative: 1.6 % (ref 0.0–5.0)
HCT: 31.2 % — ABNORMAL LOW (ref 39.0–52.0)
Hemoglobin: 10.1 g/dL — ABNORMAL LOW (ref 13.0–17.0)
Lymphocytes Relative: 24.1 % (ref 12.0–46.0)
Lymphs Abs: 1.3 10*3/uL (ref 0.7–4.0)
MCHC: 32.3 g/dL (ref 30.0–36.0)
MCV: 98.4 fl (ref 78.0–100.0)
Monocytes Absolute: 0.7 10*3/uL (ref 0.1–1.0)
Monocytes Relative: 12 % (ref 3.0–12.0)
Neutro Abs: 3.5 10*3/uL (ref 1.4–7.7)
Neutrophils Relative %: 61.9 % (ref 43.0–77.0)
Platelets: 163 10*3/uL (ref 150.0–400.0)
RBC: 3.17 Mil/uL — ABNORMAL LOW (ref 4.22–5.81)
RDW: 24.2 % — ABNORMAL HIGH (ref 11.5–15.5)
WBC: 5.6 10*3/uL (ref 4.0–10.5)
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
## 2020-09-10 NOTE — Patient Instructions (Signed)
If you are age 81 or older, your body mass index should be between 23-30. Your Body mass index is 26.48 kg/m. If this is out of the aforementioned range listed, please consider follow up with your Primary Care Provider.  If you are age 51 or younger, your body mass index should be between 19-25. Your Body mass index is 26.48 kg/m. If this is out of the aformentioned range listed, please consider follow up with your Primary Care Provider.   Continue protonix  Please go to the lab on the 2nd floor [address] before you leave the office today.   Follow up as needed. Please call with any questions or concerns.  Thank you,  Dr. Jackquline Denmark

## 2020-09-13 ENCOUNTER — Other Ambulatory Visit: Payer: Self-pay | Admitting: *Deleted

## 2020-09-13 NOTE — Patient Outreach (Signed)
[place] Coordinator follow up. Member screened for potential [facility] Care Management needs.  [name] transitioned to home from [facility] on 09/11/20. Telephone call made to [name] [phone]. Patient identifiers confirmed. [name] states his daughter Todd Lloyd is a retired Marine scientist and handles everything. Advises Probation officer to contact Todd Lloyd to discuss [facility] services.  Telephone call made to [name]'s daughter/DPR Todd Lloyd [phone]. Patient identifiers confirmed. Todd Lloyd states member has [employer]. He has all of his follow up appointments scheduled. Todd Lloyd endorses that she is very involved and active with [name] care. 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 having any [facility] Care Management needs at this time.    Marthenia Rolling, MSN, RN,BSN [place] Acute Care Coordinator [phone] [facility]) [phone]  (Toll free office)

## 2020-09-15 DIAGNOSIS — Z9481 Bone marrow transplant status: Secondary | ICD-10-CM | POA: Diagnosis not present

## 2020-09-15 DIAGNOSIS — Z8701 Personal history of pneumonia (recurrent): Secondary | ICD-10-CM | POA: Diagnosis not present

## 2020-09-15 DIAGNOSIS — M545 Low back pain, unspecified: Secondary | ICD-10-CM | POA: Diagnosis not present

## 2020-09-15 DIAGNOSIS — I10 Essential (primary) hypertension: Secondary | ICD-10-CM | POA: Diagnosis not present

## 2020-09-15 DIAGNOSIS — G8929 Other chronic pain: Secondary | ICD-10-CM | POA: Diagnosis not present

## 2020-09-15 DIAGNOSIS — G629 Polyneuropathy, unspecified: Secondary | ICD-10-CM | POA: Diagnosis not present

## 2020-09-15 DIAGNOSIS — C9591 Leukemia, unspecified, in remission: Secondary | ICD-10-CM | POA: Diagnosis not present

## 2020-09-15 DIAGNOSIS — Z7901 Long term (current) use of anticoagulants: Secondary | ICD-10-CM | POA: Diagnosis not present

## 2020-09-15 DIAGNOSIS — F0390 Unspecified dementia without behavioral disturbance: Secondary | ICD-10-CM | POA: Diagnosis not present

## 2020-09-15 DIAGNOSIS — I4891 Unspecified atrial fibrillation: Secondary | ICD-10-CM | POA: Diagnosis not present

## 2020-09-15 DIAGNOSIS — Z9981 Dependence on supplemental oxygen: Secondary | ICD-10-CM | POA: Diagnosis not present

## 2020-09-15 DIAGNOSIS — Z85830 Personal history of malignant neoplasm of bone: Secondary | ICD-10-CM | POA: Diagnosis not present

## 2020-09-15 DIAGNOSIS — Z7951 Long term (current) use of inhaled steroids: Secondary | ICD-10-CM | POA: Diagnosis not present

## 2020-09-15 DIAGNOSIS — Z86010 Personal history of colonic polyps: Secondary | ICD-10-CM | POA: Diagnosis not present

## 2020-09-15 DIAGNOSIS — I872 Venous insufficiency (chronic) (peripheral): Secondary | ICD-10-CM | POA: Diagnosis not present

## 2020-09-15 DIAGNOSIS — G4733 Obstructive sleep apnea (adult) (pediatric): Secondary | ICD-10-CM | POA: Diagnosis not present

## 2020-09-15 DIAGNOSIS — G2581 Restless legs syndrome: Secondary | ICD-10-CM | POA: Diagnosis not present

## 2020-09-15 DIAGNOSIS — G253 Myoclonus: Secondary | ICD-10-CM | POA: Diagnosis not present

## 2020-09-15 DIAGNOSIS — J449 Chronic obstructive pulmonary disease, unspecified: Secondary | ICD-10-CM | POA: Diagnosis not present

## 2020-09-15 DIAGNOSIS — J9611 Chronic respiratory failure with hypoxia: Secondary | ICD-10-CM | POA: Diagnosis not present

## 2020-09-15 DIAGNOSIS — K219 Gastro-esophageal reflux disease without esophagitis: Secondary | ICD-10-CM | POA: Diagnosis not present

## 2020-09-15 DIAGNOSIS — D500 Iron deficiency anemia secondary to blood loss (chronic): Secondary | ICD-10-CM | POA: Diagnosis not present

## 2020-09-15 DIAGNOSIS — E785 Hyperlipidemia, unspecified: Secondary | ICD-10-CM | POA: Diagnosis not present

## 2020-09-15 DIAGNOSIS — G4700 Insomnia, unspecified: Secondary | ICD-10-CM | POA: Diagnosis not present

## 2020-09-15 DIAGNOSIS — Z85118 Personal history of other malignant neoplasm of bronchus and lung: Secondary | ICD-10-CM | POA: Diagnosis not present

## 2020-09-15 NOTE — Progress Notes (Signed)
Please inform the patient's daughter Looking much better Hb upto 10 Send report to family physician

## 2020-09-17 NOTE — Progress Notes (Signed)
Please let patient know his CXR did not show any pneumonia or pulmonary edema. He has follow up with Dr. Lamonte Sakai 5/6. Thanks

## 2020-09-20 ENCOUNTER — Telehealth: Payer: Self-pay | Admitting: General Surgery

## 2020-09-20 NOTE — Telephone Encounter (Signed)
Notified [name] that his Hb was up to 10. She was very happy and they will call if anything changes. Report sent to PCP.

## 2020-09-20 NOTE — Telephone Encounter (Signed)
-----   Message from Jackquline Denmark, MD sent at 09/15/2020  7:56 PM EDT ----- Please inform the patient's daughter Looking much better Hb upto 10 Send report to family physician

## 2020-09-21 DIAGNOSIS — J9611 Chronic respiratory failure with hypoxia: Secondary | ICD-10-CM | POA: Diagnosis not present

## 2020-09-21 DIAGNOSIS — G253 Myoclonus: Secondary | ICD-10-CM | POA: Diagnosis not present

## 2020-09-21 DIAGNOSIS — F0390 Unspecified dementia without behavioral disturbance: Secondary | ICD-10-CM | POA: Diagnosis not present

## 2020-09-21 DIAGNOSIS — I10 Essential (primary) hypertension: Secondary | ICD-10-CM | POA: Diagnosis not present

## 2020-09-21 DIAGNOSIS — G629 Polyneuropathy, unspecified: Secondary | ICD-10-CM | POA: Diagnosis not present

## 2020-09-21 DIAGNOSIS — J449 Chronic obstructive pulmonary disease, unspecified: Secondary | ICD-10-CM | POA: Diagnosis not present

## 2020-09-23 DIAGNOSIS — G253 Myoclonus: Secondary | ICD-10-CM | POA: Diagnosis not present

## 2020-09-23 DIAGNOSIS — J9611 Chronic respiratory failure with hypoxia: Secondary | ICD-10-CM | POA: Diagnosis not present

## 2020-09-23 DIAGNOSIS — G629 Polyneuropathy, unspecified: Secondary | ICD-10-CM | POA: Diagnosis not present

## 2020-09-23 DIAGNOSIS — J449 Chronic obstructive pulmonary disease, unspecified: Secondary | ICD-10-CM | POA: Diagnosis not present

## 2020-09-23 DIAGNOSIS — F0390 Unspecified dementia without behavioral disturbance: Secondary | ICD-10-CM | POA: Diagnosis not present

## 2020-09-23 DIAGNOSIS — I10 Essential (primary) hypertension: Secondary | ICD-10-CM | POA: Diagnosis not present

## 2020-09-24 ENCOUNTER — Encounter: Payer: Self-pay | Admitting: Emergency Medicine

## 2020-09-24 ENCOUNTER — Other Ambulatory Visit: Payer: Self-pay

## 2020-09-24 ENCOUNTER — Ambulatory Visit (INDEPENDENT_AMBULATORY_CARE_PROVIDER_SITE_OTHER): Payer: Medicare Other | Admitting: Emergency Medicine

## 2020-09-24 VITALS — BP 110/64 | HR 71 | Temp 97.5°F | Ht 72.0 in | Wt 191.4 lb

## 2020-09-24 DIAGNOSIS — J9612 Chronic respiratory failure with hypercapnia: Secondary | ICD-10-CM

## 2020-09-24 DIAGNOSIS — J189 Pneumonia, unspecified organism: Secondary | ICD-10-CM | POA: Diagnosis not present

## 2020-09-24 DIAGNOSIS — J9611 Chronic respiratory failure with hypoxia: Secondary | ICD-10-CM

## 2020-09-24 DIAGNOSIS — J9621 Acute and chronic respiratory failure with hypoxia: Secondary | ICD-10-CM | POA: Diagnosis not present

## 2020-09-24 DIAGNOSIS — F0390 Unspecified dementia without behavioral disturbance: Secondary | ICD-10-CM | POA: Diagnosis not present

## 2020-09-24 DIAGNOSIS — C92Z1 Other myeloid leukemia, in remission: Secondary | ICD-10-CM | POA: Diagnosis not present

## 2020-09-24 DIAGNOSIS — C3431 Malignant neoplasm of lower lobe, right bronchus or lung: Secondary | ICD-10-CM | POA: Diagnosis not present

## 2020-09-24 DIAGNOSIS — I480 Paroxysmal atrial fibrillation: Secondary | ICD-10-CM | POA: Diagnosis not present

## 2020-09-24 DIAGNOSIS — J449 Chronic obstructive pulmonary disease, unspecified: Secondary | ICD-10-CM | POA: Diagnosis not present

## 2020-09-24 DIAGNOSIS — I10 Essential (primary) hypertension: Secondary | ICD-10-CM | POA: Diagnosis not present

## 2020-09-24 DIAGNOSIS — G9341 Metabolic encephalopathy: Secondary | ICD-10-CM | POA: Diagnosis not present

## 2020-09-24 MED ORDER — MOMETASONE FUROATE 220 MCG/INH IN AEPB: 2.0000 | INHALATION_SPRAY | Freq: Every day | RESPIRATORY_TRACT | 2 refills | Status: DC

## 2020-09-24 NOTE — Patient Instructions (Signed)
Please continue your Stiolto and Asmanex as you have been taking them. Keep your albuterol available use 2 puffs when you need it for shortness of breath, chest tightness, wheezing. Continue to wear your BiPAP at night We will arrange for a oximetry test to help obtain a portable oxygen concentrator For now continue your oxygen at 2 L/min continuous COVID-19 vaccine is up-to-date.  Please get the second booster when it is due. Follow with radiation oncology and get your CT scan of the chest as planned Follow with Dr Lamonte Sakai in 3 months or sooner if you have any problems.

## 2020-09-24 NOTE — Assessment & Plan Note (Addendum)
Severe disease.  Agree with his DNR status and I have placed orders in our chart.  He has a MOST form and a paper copy of the DNR order at home.  Plan to continue his Stiolto, Asmanex, albuterol as needed.  He is getting home health PT which should be beneficial.  Please continue your Stiolto and Asmanex as you have been taking them. Keep your albuterol available use 2 puffs when you need it for shortness of breath, chest tightness, wheezing. COVID-19 vaccine is up-to-date.  Please get the second booster when it is due. Follow with radiation oncology and get your CT scan of the chest as planned Follow with Dr Lamonte Sakai in 3 months or sooner if you have any problems.

## 2020-09-24 NOTE — Assessment & Plan Note (Signed)
Continue BiPAP nightly. Continue oxygen at 2 L/min continuous for now.  He wants a POC and we will arrange for a walking titration on pulsed oxygen.  There may be difficulty getting this from one of the DME companies as there is a Event organiser.

## 2020-09-24 NOTE — Progress Notes (Addendum)
Subjective:    Patient ID: Todd Lloyd, male    DOB: Dec 31, 1940, 80 y.o.   MRN: [id number]  Shortness of Breath Associated symptoms include chest pain. Pertinent negatives include no ear pain, fever, headaches, leg swelling, rash, rhinorrhea, sore throat, vomiting or wheezing.    ROV 03/08/20 --[name] follows up today to review his PET scan.  He is 79 with severe COPD and associated hypoxemic respiratory failure.  Also has a history of leukemia with a remote bone marrow transplant.  He has a right lower lobe spiculated pulmonary nodule that is increasing in size, most recently on CT 02/03/2020. PET scan 02/24/2020 reviewed by me, shows peripheral right lower lobe nodule 1.7 x 1.1 cm that is hypermetabolic (SUV 4.7).  No mediastinal or hilar lymphadenopathy seen.  Still with severe exertional SOB, on Stiolto. Uses albuterol 3x a day - not sure it helps him much.   ROV 09/24/20 --pleasant 80 year old gentleman whom we follow for severe COPD with associated hypoxemic respiratory failure.  Also with a history of leukemia and a remote BMT.  He underwent SBRT for a hypermetabolic 1.7 cm right lower lobe pulmonary nodule.  He was hospitalized about 1 month ago for combined respiratory failure with acute encephalopathy in the setting of a pneumonia and acute exacerbation of his COPD.  He was discharged on BiPAP support, has had some difficulty with mask fit.  His CODE STATUS is DNR which should be indicated in the chart.  He managed on Stiolto, Asmanex. He needs an assessment for POC.  His breathing has improved. He has slowed his pace, is wearing his O2 reliably. Sometimes some nocturnal cough. Albuterol about 2-3x a day. He is doing HH PT.  Has not had COVID, has been vaccinated.   Addendum 12/09/20: Patient is using his BiPAP reliably, is clinically benefiting from it. Has improved sleep, improved overall breathing and functional capacity when he uses it.  Review of Systems  Constitutional:  Negative for fever and unexpected weight change.  HENT: Negative for congestion, dental problem, ear pain, nosebleeds, postnasal drip, rhinorrhea, sinus pressure, sneezing, sore throat and trouble swallowing.   Eyes: Negative for redness and itching.  Respiratory: Positive for shortness of breath. Negative for cough, chest tightness and wheezing.   Cardiovascular: Positive for chest pain. Negative for palpitations and leg swelling.  Gastrointestinal: Negative for nausea and vomiting.  Genitourinary: Negative for dysuria.  Musculoskeletal: Negative for joint swelling.  Skin: Negative for rash.  Neurological: Negative for headaches.  Hematological: Does not bruise/bleed easily.  Psychiatric/Behavioral: Negative for dysphoric mood. The patient is not nervous/anxious.         Objective:   Physical Exam Vitals:   09/24/20 1025  BP: 110/64  Pulse: 71  Temp: (!) 97.5 F (36.4 C)  TempSrc: Temporal  SpO2: 96%  Weight: 191 lb 6.4 oz (86.8 kg)  Height: 6' (1.829 m)   Gen: Pleasant, elderly man, in no distress,  normal affect, using a walker  ENT: No lesions,  mouth clear,  oropharynx clear, no postnasal drip  Neck: No JVD, no stridor  Lungs: No use of accessory muscles, distant, no wheeze on a normal breath.  He does have diffuse wheezes on forced expiration  Cardiovascular: RRR, heart sounds normal, no murmur or gallops, no peripheral edema  Musculoskeletal: No deformities, no cyanosis or clubbing  Neuro: alert, awake, interacting appropriately, well oriented  Skin: Warm, no lesions or rashes      Assessment & Plan:  COPD (chronic obstructive  pulmonary disease) ([place]) Severe disease.  Agree with his DNR status and I have placed orders in our chart.  He has a MOST form and a paper copy of the DNR order at home.  Plan to continue his Stiolto, Asmanex, albuterol as needed.  He is getting home health PT which should be beneficial.  Please continue your Stiolto and Asmanex as you  have been taking them. Keep your albuterol available use 2 puffs when you need it for shortness of breath, chest tightness, wheezing. COVID-19 vaccine is up-to-date.  Please get the second booster when it is due. Follow with radiation oncology and get your CT scan of the chest as planned Follow with Dr Lamonte Sakai in 3 months or sooner if you have any problems.  Chronic respiratory failure with hypoxia and hypercapnia (HCC) Continue BiPAP nightly. Continue oxygen at 2 L/min continuous for now.  He wants a POC and we will arrange for a walking titration on pulsed oxygen.  There may be difficulty getting this from one of the DME companies as there is a Event organiser.  Malignant neoplasm of lower lobe of right lung ([place]) Has undergone SBRT.  Planning for repeat CT chest and follow-up with radiation oncology this summer.  [name], MD, PhD 09/24/2020, 10:48 AM [facility] [phone] or if no answer [phone]

## 2020-09-24 NOTE — Assessment & Plan Note (Signed)
Has undergone SBRT.  Planning for repeat CT chest and follow-up with radiation oncology this summer.

## 2020-09-24 NOTE — Addendum Note (Signed)
Addended by: Gavin Potters R on: 09/24/2020 11:16 AM   Modules accepted: Orders

## 2020-09-27 ENCOUNTER — Other Ambulatory Visit: Payer: Self-pay | Admitting: Emergency Medicine

## 2020-09-27 DIAGNOSIS — F0390 Unspecified dementia without behavioral disturbance: Secondary | ICD-10-CM | POA: Diagnosis not present

## 2020-09-27 DIAGNOSIS — G253 Myoclonus: Secondary | ICD-10-CM | POA: Diagnosis not present

## 2020-09-27 DIAGNOSIS — I10 Essential (primary) hypertension: Secondary | ICD-10-CM | POA: Diagnosis not present

## 2020-09-27 DIAGNOSIS — J449 Chronic obstructive pulmonary disease, unspecified: Secondary | ICD-10-CM | POA: Diagnosis not present

## 2020-09-27 DIAGNOSIS — J9611 Chronic respiratory failure with hypoxia: Secondary | ICD-10-CM | POA: Diagnosis not present

## 2020-09-27 DIAGNOSIS — G629 Polyneuropathy, unspecified: Secondary | ICD-10-CM | POA: Diagnosis not present

## 2020-09-27 MED ORDER — ASMANEX (60 METERED DOSES) 220 MCG/INH IN AEPB: 2.0000 | INHALATION_SPRAY | Freq: Every day | RESPIRATORY_TRACT | 12 refills | Status: AC

## 2020-09-28 DIAGNOSIS — J9611 Chronic respiratory failure with hypoxia: Secondary | ICD-10-CM | POA: Diagnosis not present

## 2020-09-28 DIAGNOSIS — I10 Essential (primary) hypertension: Secondary | ICD-10-CM | POA: Diagnosis not present

## 2020-09-28 DIAGNOSIS — G629 Polyneuropathy, unspecified: Secondary | ICD-10-CM | POA: Diagnosis not present

## 2020-09-28 DIAGNOSIS — F0390 Unspecified dementia without behavioral disturbance: Secondary | ICD-10-CM | POA: Diagnosis not present

## 2020-09-28 DIAGNOSIS — G253 Myoclonus: Secondary | ICD-10-CM | POA: Diagnosis not present

## 2020-09-28 DIAGNOSIS — J449 Chronic obstructive pulmonary disease, unspecified: Secondary | ICD-10-CM | POA: Diagnosis not present

## 2020-09-30 DIAGNOSIS — J449 Chronic obstructive pulmonary disease, unspecified: Secondary | ICD-10-CM | POA: Diagnosis not present

## 2020-09-30 DIAGNOSIS — G629 Polyneuropathy, unspecified: Secondary | ICD-10-CM | POA: Diagnosis not present

## 2020-09-30 DIAGNOSIS — I10 Essential (primary) hypertension: Secondary | ICD-10-CM | POA: Diagnosis not present

## 2020-09-30 DIAGNOSIS — J9611 Chronic respiratory failure with hypoxia: Secondary | ICD-10-CM | POA: Diagnosis not present

## 2020-09-30 DIAGNOSIS — F0390 Unspecified dementia without behavioral disturbance: Secondary | ICD-10-CM | POA: Diagnosis not present

## 2020-09-30 DIAGNOSIS — G253 Myoclonus: Secondary | ICD-10-CM | POA: Diagnosis not present

## 2020-10-05 DIAGNOSIS — J9611 Chronic respiratory failure with hypoxia: Secondary | ICD-10-CM | POA: Diagnosis not present

## 2020-10-05 DIAGNOSIS — I10 Essential (primary) hypertension: Secondary | ICD-10-CM | POA: Diagnosis not present

## 2020-10-05 DIAGNOSIS — G629 Polyneuropathy, unspecified: Secondary | ICD-10-CM | POA: Diagnosis not present

## 2020-10-05 DIAGNOSIS — G253 Myoclonus: Secondary | ICD-10-CM | POA: Diagnosis not present

## 2020-10-05 DIAGNOSIS — F0390 Unspecified dementia without behavioral disturbance: Secondary | ICD-10-CM | POA: Diagnosis not present

## 2020-10-05 DIAGNOSIS — J449 Chronic obstructive pulmonary disease, unspecified: Secondary | ICD-10-CM | POA: Diagnosis not present

## 2020-10-14 DIAGNOSIS — J449 Chronic obstructive pulmonary disease, unspecified: Secondary | ICD-10-CM | POA: Diagnosis not present

## 2020-10-14 DIAGNOSIS — G253 Myoclonus: Secondary | ICD-10-CM | POA: Diagnosis not present

## 2020-10-14 DIAGNOSIS — J9611 Chronic respiratory failure with hypoxia: Secondary | ICD-10-CM | POA: Diagnosis not present

## 2020-10-14 DIAGNOSIS — I10 Essential (primary) hypertension: Secondary | ICD-10-CM | POA: Diagnosis not present

## 2020-10-14 DIAGNOSIS — F0390 Unspecified dementia without behavioral disturbance: Secondary | ICD-10-CM | POA: Diagnosis not present

## 2020-10-14 DIAGNOSIS — G629 Polyneuropathy, unspecified: Secondary | ICD-10-CM | POA: Diagnosis not present

## 2020-10-15 ENCOUNTER — Ambulatory Visit: Payer: Medicare Other | Admitting: Cardiovascular Disease

## 2020-10-15 DIAGNOSIS — Z9481 Bone marrow transplant status: Secondary | ICD-10-CM | POA: Diagnosis not present

## 2020-10-15 DIAGNOSIS — G253 Myoclonus: Secondary | ICD-10-CM | POA: Diagnosis not present

## 2020-10-15 DIAGNOSIS — C9591 Leukemia, unspecified, in remission: Secondary | ICD-10-CM | POA: Diagnosis not present

## 2020-10-15 DIAGNOSIS — Z7901 Long term (current) use of anticoagulants: Secondary | ICD-10-CM | POA: Diagnosis not present

## 2020-10-15 DIAGNOSIS — K219 Gastro-esophageal reflux disease without esophagitis: Secondary | ICD-10-CM | POA: Diagnosis not present

## 2020-10-15 DIAGNOSIS — G2581 Restless legs syndrome: Secondary | ICD-10-CM | POA: Diagnosis not present

## 2020-10-15 DIAGNOSIS — G4733 Obstructive sleep apnea (adult) (pediatric): Secondary | ICD-10-CM | POA: Diagnosis not present

## 2020-10-15 DIAGNOSIS — Z9981 Dependence on supplemental oxygen: Secondary | ICD-10-CM | POA: Diagnosis not present

## 2020-10-15 DIAGNOSIS — J9611 Chronic respiratory failure with hypoxia: Secondary | ICD-10-CM | POA: Diagnosis not present

## 2020-10-15 DIAGNOSIS — I10 Essential (primary) hypertension: Secondary | ICD-10-CM | POA: Diagnosis not present

## 2020-10-15 DIAGNOSIS — G4700 Insomnia, unspecified: Secondary | ICD-10-CM | POA: Diagnosis not present

## 2020-10-15 DIAGNOSIS — G629 Polyneuropathy, unspecified: Secondary | ICD-10-CM | POA: Diagnosis not present

## 2020-10-15 DIAGNOSIS — Z86010 Personal history of colonic polyps: Secondary | ICD-10-CM | POA: Diagnosis not present

## 2020-10-15 DIAGNOSIS — Z85830 Personal history of malignant neoplasm of bone: Secondary | ICD-10-CM | POA: Diagnosis not present

## 2020-10-15 DIAGNOSIS — Z8701 Personal history of pneumonia (recurrent): Secondary | ICD-10-CM | POA: Diagnosis not present

## 2020-10-15 DIAGNOSIS — I872 Venous insufficiency (chronic) (peripheral): Secondary | ICD-10-CM | POA: Diagnosis not present

## 2020-10-15 DIAGNOSIS — J449 Chronic obstructive pulmonary disease, unspecified: Secondary | ICD-10-CM | POA: Diagnosis not present

## 2020-10-15 DIAGNOSIS — G8929 Other chronic pain: Secondary | ICD-10-CM | POA: Diagnosis not present

## 2020-10-15 DIAGNOSIS — Z7951 Long term (current) use of inhaled steroids: Secondary | ICD-10-CM | POA: Diagnosis not present

## 2020-10-15 DIAGNOSIS — I4891 Unspecified atrial fibrillation: Secondary | ICD-10-CM | POA: Diagnosis not present

## 2020-10-15 DIAGNOSIS — Z85118 Personal history of other malignant neoplasm of bronchus and lung: Secondary | ICD-10-CM | POA: Diagnosis not present

## 2020-10-15 DIAGNOSIS — E785 Hyperlipidemia, unspecified: Secondary | ICD-10-CM | POA: Diagnosis not present

## 2020-10-15 DIAGNOSIS — F0390 Unspecified dementia without behavioral disturbance: Secondary | ICD-10-CM | POA: Diagnosis not present

## 2020-10-15 DIAGNOSIS — M545 Low back pain, unspecified: Secondary | ICD-10-CM | POA: Diagnosis not present

## 2020-10-15 DIAGNOSIS — D500 Iron deficiency anemia secondary to blood loss (chronic): Secondary | ICD-10-CM | POA: Diagnosis not present

## 2020-10-19 ENCOUNTER — Ambulatory Visit: Payer: Medicare Other | Admitting: Neurology

## 2020-10-19 DIAGNOSIS — J449 Chronic obstructive pulmonary disease, unspecified: Secondary | ICD-10-CM | POA: Diagnosis not present

## 2020-10-19 DIAGNOSIS — F0390 Unspecified dementia without behavioral disturbance: Secondary | ICD-10-CM | POA: Diagnosis not present

## 2020-10-19 DIAGNOSIS — G253 Myoclonus: Secondary | ICD-10-CM | POA: Diagnosis not present

## 2020-10-19 DIAGNOSIS — G629 Polyneuropathy, unspecified: Secondary | ICD-10-CM | POA: Diagnosis not present

## 2020-10-19 DIAGNOSIS — I10 Essential (primary) hypertension: Secondary | ICD-10-CM | POA: Diagnosis not present

## 2020-10-19 DIAGNOSIS — J9611 Chronic respiratory failure with hypoxia: Secondary | ICD-10-CM | POA: Diagnosis not present

## 2020-10-20 DIAGNOSIS — J9611 Chronic respiratory failure with hypoxia: Secondary | ICD-10-CM | POA: Diagnosis not present

## 2020-10-20 DIAGNOSIS — G629 Polyneuropathy, unspecified: Secondary | ICD-10-CM | POA: Diagnosis not present

## 2020-10-20 DIAGNOSIS — I10 Essential (primary) hypertension: Secondary | ICD-10-CM | POA: Diagnosis not present

## 2020-10-20 DIAGNOSIS — G253 Myoclonus: Secondary | ICD-10-CM | POA: Diagnosis not present

## 2020-10-20 DIAGNOSIS — F0390 Unspecified dementia without behavioral disturbance: Secondary | ICD-10-CM | POA: Diagnosis not present

## 2020-10-20 DIAGNOSIS — J449 Chronic obstructive pulmonary disease, unspecified: Secondary | ICD-10-CM | POA: Diagnosis not present

## 2020-10-26 DIAGNOSIS — G253 Myoclonus: Secondary | ICD-10-CM | POA: Diagnosis not present

## 2020-10-26 DIAGNOSIS — J449 Chronic obstructive pulmonary disease, unspecified: Secondary | ICD-10-CM | POA: Diagnosis not present

## 2020-10-26 DIAGNOSIS — J9611 Chronic respiratory failure with hypoxia: Secondary | ICD-10-CM | POA: Diagnosis not present

## 2020-10-26 DIAGNOSIS — F0390 Unspecified dementia without behavioral disturbance: Secondary | ICD-10-CM | POA: Diagnosis not present

## 2020-10-26 DIAGNOSIS — G629 Polyneuropathy, unspecified: Secondary | ICD-10-CM | POA: Diagnosis not present

## 2020-10-26 DIAGNOSIS — I10 Essential (primary) hypertension: Secondary | ICD-10-CM | POA: Diagnosis not present

## 2020-11-02 DIAGNOSIS — J9611 Chronic respiratory failure with hypoxia: Secondary | ICD-10-CM | POA: Diagnosis not present

## 2020-11-02 DIAGNOSIS — I10 Essential (primary) hypertension: Secondary | ICD-10-CM | POA: Diagnosis not present

## 2020-11-02 DIAGNOSIS — F0390 Unspecified dementia without behavioral disturbance: Secondary | ICD-10-CM | POA: Diagnosis not present

## 2020-11-02 DIAGNOSIS — G253 Myoclonus: Secondary | ICD-10-CM | POA: Diagnosis not present

## 2020-11-02 DIAGNOSIS — G629 Polyneuropathy, unspecified: Secondary | ICD-10-CM | POA: Diagnosis not present

## 2020-11-02 DIAGNOSIS — J449 Chronic obstructive pulmonary disease, unspecified: Secondary | ICD-10-CM | POA: Diagnosis not present

## 2020-11-10 DIAGNOSIS — J9611 Chronic respiratory failure with hypoxia: Secondary | ICD-10-CM | POA: Diagnosis not present

## 2020-11-10 DIAGNOSIS — G629 Polyneuropathy, unspecified: Secondary | ICD-10-CM | POA: Diagnosis not present

## 2020-11-10 DIAGNOSIS — G253 Myoclonus: Secondary | ICD-10-CM | POA: Diagnosis not present

## 2020-11-10 DIAGNOSIS — J449 Chronic obstructive pulmonary disease, unspecified: Secondary | ICD-10-CM | POA: Diagnosis not present

## 2020-11-10 DIAGNOSIS — F0390 Unspecified dementia without behavioral disturbance: Secondary | ICD-10-CM | POA: Diagnosis not present

## 2020-11-10 DIAGNOSIS — I10 Essential (primary) hypertension: Secondary | ICD-10-CM | POA: Diagnosis not present

## 2020-12-06 ENCOUNTER — Telehealth: Payer: Self-pay | Admitting: Emergency Medicine

## 2020-12-06 NOTE — Telephone Encounter (Signed)
Pt daughter states that DME Co [place] said that Medicare is no longer going to pay for bipap at night/continuous oxygen during the day. States that pt needs a note/order sent to DME stating that this is a lifeling need. Also needs an appt/televisit stating there has been worsening/improvement, etc. Please advise. [phone]

## 2020-12-06 NOTE — Telephone Encounter (Signed)
Called and spoke with [name], pts daughter.  She stated that the main office of [place] called her and told her that if they did not have the documentation that they need by July 21st, they will have to pick up his equipment.  They are needing to have a note or OV note stating that he is benefiting from using his equipment and that it will be a lifelong treatment for him.   [name] stated that he has an upcoming appt with RB in August after his CT scan.  He also has appts coming up with the [place] and oncology so if he needs to have a visit a televisit would work better for them if possible.  RB please advise.  The pt was last seen 09/2020.  Thanks

## 2020-12-08 NOTE — Telephone Encounter (Signed)
Called [facility] [phone] and spoke with Arlyce Dice He states the equipment they are referring to is BIPAP  He said you can just amend the last ov 09/24/20 and need to state he is using BIPAP and benefits from it  Let triage know once done and we will fax to [employer] [phone]

## 2020-12-08 NOTE — Telephone Encounter (Signed)
Please find out which equipment they are specifically referring to, and also specifically what they need to see documented to address this. That way I can document when I am in office tomorrow.

## 2020-12-09 NOTE — Telephone Encounter (Signed)
Thank you I addended the 09/24/20 note. Should be able to forward to DME

## 2020-12-09 NOTE — Telephone Encounter (Signed)
Office visit printed and faxed to [place] at [phone].  Received verification that fax was sent successfully.  Nothing further needed.

## 2021-01-06 ENCOUNTER — Other Ambulatory Visit: Payer: Self-pay

## 2021-01-06 ENCOUNTER — Ambulatory Visit (HOSPITAL_COMMUNITY)
Admission: RE | Admit: 2021-01-06 | Discharge: 2021-01-06 | Disposition: A | Discharge status: Home / Self Care | Payer: Medicare Other | Source: Ambulatory Visit | Attending: Radiation Oncology | Admitting: Radiation Oncology

## 2021-01-06 DIAGNOSIS — J439 Emphysema, unspecified: Secondary | ICD-10-CM | POA: Diagnosis not present

## 2021-01-06 DIAGNOSIS — R911 Solitary pulmonary nodule: Secondary | ICD-10-CM | POA: Diagnosis not present

## 2021-01-06 DIAGNOSIS — C3431 Malignant neoplasm of lower lobe, right bronchus or lung: Secondary | ICD-10-CM | POA: Insufficient documentation

## 2021-01-06 DIAGNOSIS — C3490 Malignant neoplasm of unspecified part of unspecified bronchus or lung: Secondary | ICD-10-CM | POA: Diagnosis not present

## 2021-01-06 DIAGNOSIS — I700 Atherosclerosis of aorta: Secondary | ICD-10-CM | POA: Diagnosis not present

## 2021-01-06 LAB — POCT I-STAT CREATININE: Creatinine, Ser: 0.8 mg/dL (ref 0.61–1.24)

## 2021-01-06 MED ORDER — IOHEXOL 350 MG/ML SOLN: 75.0000 mL | Freq: Once | INTRAVENOUS | Status: DC | PRN

## 2021-01-06 MED ORDER — IOHEXOL 350 MG/ML SOLN
65.0000 mL | Freq: Once | INTRAVENOUS | Status: AC | PRN
  Administered 2021-01-06: 65 mL via INTRAVENOUS

## 2021-01-07 ENCOUNTER — Encounter: Payer: Self-pay | Admitting: Radiation Oncology

## 2021-01-07 NOTE — Progress Notes (Signed)
Patient reports lumbar pain 10/10 (old injury), mild fatigue, coughing (occasional), some shortness of breath. Denies any painful swallowing, choking, weight loss, and has a good appetite.  Meaningful use questions complete and patient notified of 2:00pm telephone appointment on 01/10/21 and expressed and understanding of it.

## 2021-01-10 ENCOUNTER — Ambulatory Visit
Admission: RE | Admit: 2021-01-10 | Discharge: 2021-01-10 | Disposition: A | Discharge status: Home / Self Care | Payer: Medicare Other | Source: Ambulatory Visit | Attending: Radiation Oncology | Admitting: Radiation Oncology

## 2021-01-10 DIAGNOSIS — Z08 Encounter for follow-up examination after completed treatment for malignant neoplasm: Secondary | ICD-10-CM | POA: Diagnosis not present

## 2021-01-10 DIAGNOSIS — C3431 Malignant neoplasm of lower lobe, right bronchus or lung: Secondary | ICD-10-CM
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
## 2021-01-12 ENCOUNTER — Other Ambulatory Visit: Payer: Self-pay

## 2021-01-12 ENCOUNTER — Ambulatory Visit (INDEPENDENT_AMBULATORY_CARE_PROVIDER_SITE_OTHER): Payer: Medicare Other | Admitting: Emergency Medicine

## 2021-01-12 ENCOUNTER — Encounter: Payer: Self-pay | Admitting: Emergency Medicine

## 2021-01-12 DIAGNOSIS — J449 Chronic obstructive pulmonary disease, unspecified: Secondary | ICD-10-CM | POA: Diagnosis not present

## 2021-01-12 DIAGNOSIS — C3431 Malignant neoplasm of lower lobe, right bronchus or lung: Secondary | ICD-10-CM

## 2021-01-12 DIAGNOSIS — J9612 Chronic respiratory failure with hypercapnia: Secondary | ICD-10-CM

## 2021-01-12 DIAGNOSIS — J9611 Chronic respiratory failure with hypoxia: Secondary | ICD-10-CM | POA: Diagnosis not present

## 2021-01-12 NOTE — Assessment & Plan Note (Signed)
Underwent SBRT.  Reassuring surveillance CT chest on 01/06/2021.

## 2021-01-12 NOTE — Progress Notes (Signed)
Subjective:    Patient ID: Todd Lloyd, male    DOB: 1941/03/25, 80 y.o.   MRN: [id number]  Shortness of Breath Associated symptoms include chest pain. Pertinent negatives include no congestion, coughing, fever, headaches, joint swelling, nausea, rash, sore throat or vomiting.   ROV 03/08/20 --Todd Lloyd follows up today to review his PET scan.  He is 79 with severe COPD and associated hypoxemic respiratory failure.  Also has a history of leukemia with a remote bone marrow transplant.  He has a right lower lobe spiculated pulmonary nodule that is increasing in size, most recently on CT 02/03/2020. PET scan 02/24/2020 reviewed by me, shows peripheral right lower lobe nodule 1.7 x 1.1 cm that is hypermetabolic (SUV 4.7).  No mediastinal or hilar lymphadenopathy seen.  Still with severe exertional SOB, on Stiolto. Uses albuterol 3x a day - not sure it helps him much.   ROV 09/24/20 --pleasant 80 year old gentleman whom we follow for severe COPD with associated hypoxemic respiratory failure.  Also with a history of leukemia and a remote BMT.  He underwent SBRT for a hypermetabolic 1.7 cm right lower lobe pulmonary nodule.  He was hospitalized about 1 month ago for combined respiratory failure with acute encephalopathy in the setting of a pneumonia and acute exacerbation of his COPD.  He was discharged on BiPAP support, has had some difficulty with mask fit.  His CODE STATUS is DNR which should be indicated in the chart.  He managed on Stiolto, Asmanex. He needs an assessment for POC.  His breathing has improved. He has slowed his pace, is wearing his O2 reliably. Sometimes some nocturnal cough. Albuterol about 2-3x a day. He is doing HH PT.  Has not had COVID, has been vaccinated.   Addendum 12/09/20: Patient is using his BiPAP reliably, is clinically benefiting from it. Has improved sleep, improved overall breathing and functional capacity when he uses it.  ROV 01/12/21 --Todd Lloyd is 47 and follows  up today for his history of severe COPD, SBRT for a hypermetabolic 1.7 cm right lower lobe pulmonary nodule, chronic respiratory failure on oxygen at, also BiPAP nightly.  He is using both reliably.  We titrated him to 3 L/min pulsed at his last visit.  He is having some difficulty with the BiPAP, is wearing the mask but he is having a lot of saliva that seems to pool. He is using humidity in the BiPAP. Needs documentation that he will require the BiPAP lifelong. On stiolto, Asmanex. Has some intermittent cough, ? Sometimes associated with saliva or after a meal.  COVID vaccine up to date.   Todd Lloyd is reliable with wearing his BiPAP although he is having some difficulty with his saliva, secretions.  He wears it every night.  He is clinically benefiting from it.  Improve daytime breathing and wakefulness, improved overall functional capacity and sleep quality.  He will need this lifelong due to his chronic respiratory failure and chronic hypercapnia, hypoxemia.  CT chest 01/06/21 reviewed by me, unchanged post-treatment, stable 28mm nodule LLL  Review of Systems  Constitutional:  Negative for fever and unexpected weight change.  HENT:  Negative for congestion, dental problem, ear pain, nosebleeds, postnasal drip, rhinorrhea, sinus pressure, sneezing, sore throat and trouble swallowing.   Eyes:  Negative for redness and itching.  Respiratory:  Positive for shortness of breath. Negative for cough, chest tightness and wheezing.   Cardiovascular:  Positive for chest pain. Negative for palpitations and leg swelling.  Gastrointestinal:  Negative for nausea  and vomiting.  Genitourinary:  Negative for dysuria.  Musculoskeletal:  Negative for joint swelling.  Skin:  Negative for rash.  Neurological:  Negative for headaches.  Hematological:  Does not bruise/bleed easily.  Psychiatric/Behavioral:  Negative for dysphoric mood. The patient is not nervous/anxious.        Objective:   Physical Exam Vitals:    01/12/21 0909  BP: 130/68  Pulse: 77  Temp: 97.6 F (36.4 C)  TempSrc: Oral  SpO2: 95%  Weight: 198 lb 6.4 oz (90 kg)  Height: 6' (1.829 m)   Gen: Pleasant, elderly man, in no distress,  normal affect, using a walker  ENT: No lesions,  mouth clear,  oropharynx clear, no postnasal drip  Neck: No JVD, no stridor  Lungs: No use of accessory muscles, distant, no wheeze on a normal breath.  Wheeze B on a forced exp.   Cardiovascular: RRR, heart sounds normal, no murmur or gallops, no peripheral edema  Musculoskeletal: No deformities, no cyanosis or clubbing  Neuro: alert, awake, interacting appropriately, well oriented  Skin: Warm, no lesions or rashes      Assessment & Plan:  Chronic respiratory failure with hypoxia and hypercapnia (HCC) BiPAP dependent at night, oxygen dependent at all times.  I have documented good clinical response and good compliance with his BiPAP in this note.  He needs it lifelong due to his chronic respiratory failure.  Continue to use your BiPAP every night.  Try stopping your distilled water humidity in the device to see if this helps with secretions, saliva. Continue to wear your oxygen at 3 L/min at all times.  COPD (chronic obstructive pulmonary disease) (HCC) Continue Stiolto 2 puffs once daily. Continue Asmanex as you have been taking it. Keep albuterol available to use 2 puffs or 1 nebulizer treatment up to every 4 hours if needed for shortness of breath, chest tightness, wheezing. COVID-19 vaccine is up-to-date Follow with Dr Lamonte Sakai in 6 months or sooner if you have any problems  Malignant neoplasm of lower lobe of right lung (HCC) Underwent SBRT.  Reassuring surveillance CT chest on 01/06/2021.  Todd Apo, MD, PhD 01/12/2021, 9:32 AM [facility] [phone] or if no answer [phone]

## 2021-01-12 NOTE — Assessment & Plan Note (Addendum)
Continue Stiolto 2 puffs once daily. Continue Asmanex as you have been taking it. Keep albuterol available to use 2 puffs or 1 nebulizer treatment up to every 4 hours if needed for shortness of breath, chest tightness, wheezing. COVID-19 vaccine is up-to-date Follow with Dr Lamonte Sakai in 6 months or sooner if you have any problems

## 2021-01-12 NOTE — Assessment & Plan Note (Addendum)
BiPAP dependent at night, oxygen dependent at all times.  I have documented good clinical response and good compliance with his BiPAP in this note.  He needs it lifelong due to his chronic respiratory failure.  Continue to use your BiPAP every night.  Try stopping your distilled water humidity in the device to see if this helps with secretions, saliva. Continue to wear your oxygen at 3 L/min at all times.

## 2021-01-12 NOTE — Patient Instructions (Signed)
Continue to use your BiPAP every night.  Try stopping your distilled water humidity in the device to see if this helps with secretions, saliva. Continue to wear your oxygen at 3 L/min at all times. Continue Stiolto 2 puffs once daily. Continue Asmanex as you have been taking it. Keep albuterol available to use 2 puffs or 1 nebulizer treatment up to every 4 hours if needed for shortness of breath, chest tightness, wheezing. COVID-19 vaccine is up-to-date We reviewed the CT scan of the chest today, stable compared with priors.  Get your repeat scans as directed by radiation oncology. Follow with Dr Lamonte Sakai in 6 months or sooner if you have any problems

## 2021-01-14 DIAGNOSIS — M4802 Spinal stenosis, cervical region: Secondary | ICD-10-CM | POA: Diagnosis not present

## 2021-01-14 DIAGNOSIS — M48061 Spinal stenosis, lumbar region without neurogenic claudication: Secondary | ICD-10-CM | POA: Diagnosis not present

## 2021-01-14 DIAGNOSIS — M5416 Radiculopathy, lumbar region: Secondary | ICD-10-CM | POA: Diagnosis not present
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
## 2021-02-14 ENCOUNTER — Ambulatory Visit (INDEPENDENT_AMBULATORY_CARE_PROVIDER_SITE_OTHER): Payer: Medicare Other | Admitting: Cardiovascular Disease

## 2021-02-14 ENCOUNTER — Other Ambulatory Visit: Payer: Self-pay

## 2021-02-14 ENCOUNTER — Encounter (HOSPITAL_BASED_OUTPATIENT_CLINIC_OR_DEPARTMENT_OTHER): Payer: Self-pay | Admitting: Cardiovascular Disease

## 2021-02-14 VITALS — BP 118/64 | HR 66 | Ht 72.0 in | Wt 200.4 lb

## 2021-02-14 DIAGNOSIS — I10 Essential (primary) hypertension: Secondary | ICD-10-CM

## 2021-02-14 DIAGNOSIS — I482 Chronic atrial fibrillation, unspecified: Secondary | ICD-10-CM

## 2021-02-14 DIAGNOSIS — I2510 Atherosclerotic heart disease of native coronary artery without angina pectoris: Secondary | ICD-10-CM | POA: Diagnosis not present

## 2021-02-14 DIAGNOSIS — Z5181 Encounter for therapeutic drug level monitoring: Secondary | ICD-10-CM

## 2021-02-14 HISTORY — DX: Atherosclerotic heart disease of native coronary artery without angina pectoris: I25.10

## 2021-02-14 NOTE — Assessment & Plan Note (Signed)
Incidentally noted on chest CT.  He is asymptomatic.  He is not on aspirin given that he is on Eliquis.  Lipids have been well have been controlled.  He sees his PCP next month and will have labs drawn at that time.

## 2021-02-14 NOTE — Assessment & Plan Note (Signed)
Blood pressure is well-controlled on metoprolol.

## 2021-02-14 NOTE — Assessment & Plan Note (Signed)
Remains in atrial fibrillation.  Rate is well-controlled on metoprolol and digoxin.  Continue Eliquis.  He is having labs done with his PCP in a couple weeks.  We will asked that they check a digoxin level at that time.  He will not take his digoxin that morning until after the labs are drawn.  If they are unwilling to check it and he can return here to have a separate lab draw.

## 2021-02-14 NOTE — Patient Instructions (Signed)
Medication Instructions:  Your physician recommends that you continue on your current medications as directed. Please refer to the Current Medication list given to you today.   *If you need a refill on your cardiac medications before your next appointment, please call your pharmacy*  Lab Work: DIGOXIN LEVEL WHEN YOU GO FOR YOUR LABS WITH PRIMARY CARE  DO NOT TAKE YOUR DIGOXIN MORNING OF YOUR LAB   If you have labs (blood work) drawn today and your tests are completely normal, you will receive your results only by: MyChart Message (if you have MyChart) OR A paper copy in the mail If you have any lab test that is abnormal or we need to change your treatment, we will call you to review the results.  Testing/Procedures: NONE  Follow-Up: At [facility], you and your health needs are our priority.  As part of our continuing mission to provide you with exceptional heart care, we have created designated Provider Care Teams.  These Care Teams include your primary Cardiologist (physician) and Advanced Practice Providers (APPs -  Physician Assistants and Nurse Practitioners) who all work together to provide you with the care you need, when you need it.  We recommend signing up for the patient portal called "MyChart".  Sign up information is provided on this After Visit Summary.  MyChart is used to connect with patients for Virtual Visits (Telemedicine).  Patients are able to view lab/test results, encounter notes, upcoming appointments, etc.  Non-urgent messages can be sent to your provider as well.   To learn more about what you can do with MyChart, go to [email or web address].    Your next appointment:   6 month(s)  The format for your next appointment:   In Person  Provider:   Laurann Montana, NP  Your physician recommends that you schedule a follow-up appointment in: [place]

## 2021-02-23 DIAGNOSIS — Z125 Encounter for screening for malignant neoplasm of prostate: Secondary | ICD-10-CM | POA: Diagnosis not present

## 2021-02-23 DIAGNOSIS — E538 Deficiency of other specified B group vitamins: Secondary | ICD-10-CM | POA: Diagnosis not present

## 2021-02-23 DIAGNOSIS — I480 Paroxysmal atrial fibrillation: Secondary | ICD-10-CM | POA: Diagnosis not present

## 2021-02-23 DIAGNOSIS — I10 Essential (primary) hypertension: Secondary | ICD-10-CM | POA: Diagnosis not present

## 2021-02-23 DIAGNOSIS — E7800 Pure hypercholesterolemia, unspecified: Secondary | ICD-10-CM | POA: Diagnosis not present

## 2021-03-02 DIAGNOSIS — I480 Paroxysmal atrial fibrillation: Secondary | ICD-10-CM | POA: Diagnosis not present

## 2021-03-02 DIAGNOSIS — Z0000 Encounter for general adult medical examination without abnormal findings: Secondary | ICD-10-CM | POA: Diagnosis not present

## 2021-03-02 DIAGNOSIS — E7800 Pure hypercholesterolemia, unspecified: Secondary | ICD-10-CM | POA: Diagnosis not present

## 2021-03-02 DIAGNOSIS — R918 Other nonspecific abnormal finding of lung field: Secondary | ICD-10-CM | POA: Diagnosis not present

## 2021-03-02 DIAGNOSIS — F332 Major depressive disorder, recurrent severe without psychotic features: Secondary | ICD-10-CM | POA: Diagnosis not present

## 2021-03-02 DIAGNOSIS — Z1389 Encounter for screening for other disorder: Secondary | ICD-10-CM | POA: Diagnosis not present

## 2021-03-02 DIAGNOSIS — M5417 Radiculopathy, lumbosacral region: Secondary | ICD-10-CM | POA: Diagnosis not present

## 2021-03-02 DIAGNOSIS — I10 Essential (primary) hypertension: Secondary | ICD-10-CM | POA: Diagnosis not present

## 2021-03-02 DIAGNOSIS — Z1212 Encounter for screening for malignant neoplasm of rectum: Secondary | ICD-10-CM | POA: Diagnosis not present

## 2021-03-02 DIAGNOSIS — F0390 Unspecified dementia without behavioral disturbance: Secondary | ICD-10-CM | POA: Diagnosis not present

## 2021-03-02 DIAGNOSIS — Z1331 Encounter for screening for depression: Secondary | ICD-10-CM | POA: Diagnosis not present

## 2021-03-02 DIAGNOSIS — J449 Chronic obstructive pulmonary disease, unspecified: Secondary | ICD-10-CM | POA: Diagnosis not present

## 2021-03-02 DIAGNOSIS — C92Z1 Other myeloid leukemia, in remission: Secondary | ICD-10-CM | POA: Diagnosis not present

## 2021-03-02 DIAGNOSIS — N401 Enlarged prostate with lower urinary tract symptoms: Secondary | ICD-10-CM | POA: Diagnosis not present

## 2021-03-02 DIAGNOSIS — R82998 Other abnormal findings in urine: Secondary | ICD-10-CM | POA: Diagnosis not present

## 2021-03-02 DIAGNOSIS — D692 Other nonthrombocytopenic purpura: Secondary | ICD-10-CM | POA: Diagnosis not present

## 2021-03-02 DIAGNOSIS — Z23 Encounter for immunization: Secondary | ICD-10-CM | POA: Diagnosis not present

## 2021-04-21 ENCOUNTER — Other Ambulatory Visit: Payer: Self-pay | Admitting: Neurology

## 2021-05-20 DIAGNOSIS — F0390 Unspecified dementia without behavioral disturbance: Secondary | ICD-10-CM | POA: Diagnosis not present

## 2021-05-20 DIAGNOSIS — J449 Chronic obstructive pulmonary disease, unspecified: Secondary | ICD-10-CM | POA: Diagnosis not present

## 2021-05-20 DIAGNOSIS — I10 Essential (primary) hypertension: Secondary | ICD-10-CM | POA: Diagnosis not present

## 2021-05-20 DIAGNOSIS — I480 Paroxysmal atrial fibrillation: Secondary | ICD-10-CM | POA: Diagnosis not present

## 2021-06-02 DIAGNOSIS — L728 Other follicular cysts of the skin and subcutaneous tissue: Secondary | ICD-10-CM | POA: Diagnosis not present

## 2021-06-02 DIAGNOSIS — L218 Other seborrheic dermatitis: Secondary | ICD-10-CM | POA: Diagnosis not present

## 2021-06-02 DIAGNOSIS — D485 Neoplasm of uncertain behavior of skin: Secondary | ICD-10-CM | POA: Diagnosis not present

## 2021-06-02 DIAGNOSIS — C44311 Basal cell carcinoma of skin of nose: Secondary | ICD-10-CM | POA: Diagnosis not present

## 2021-06-30 DIAGNOSIS — L218 Other seborrheic dermatitis: Secondary | ICD-10-CM | POA: Diagnosis not present

## 2021-07-04 DIAGNOSIS — C44311 Basal cell carcinoma of skin of nose: Secondary | ICD-10-CM | POA: Diagnosis not present

## 2021-07-08 ENCOUNTER — Other Ambulatory Visit: Payer: Self-pay

## 2021-07-08 ENCOUNTER — Encounter (HOSPITAL_COMMUNITY): Payer: Self-pay

## 2021-07-08 ENCOUNTER — Ambulatory Visit (HOSPITAL_COMMUNITY)
Admission: RE | Admit: 2021-07-08 | Discharge: 2021-07-08 | Disposition: A | Discharge status: Home / Self Care | Payer: Medicare Other | Source: Ambulatory Visit | Attending: Radiation Oncology | Admitting: Radiation Oncology

## 2021-07-08 DIAGNOSIS — C3490 Malignant neoplasm of unspecified part of unspecified bronchus or lung: Secondary | ICD-10-CM | POA: Diagnosis not present

## 2021-07-08 DIAGNOSIS — I700 Atherosclerosis of aorta: Secondary | ICD-10-CM | POA: Diagnosis not present

## 2021-07-08 DIAGNOSIS — C3431 Malignant neoplasm of lower lobe, right bronchus or lung: Secondary | ICD-10-CM | POA: Insufficient documentation

## 2021-07-08 DIAGNOSIS — J439 Emphysema, unspecified: Secondary | ICD-10-CM | POA: Diagnosis not present

## 2021-07-08 LAB — POCT I-STAT CREATININE: Creatinine, Ser: 1 mg/dL (ref 0.61–1.24)

## 2021-07-08 MED ORDER — SODIUM CHLORIDE (PF) 0.9 % IJ SOLN
INTRAMUSCULAR | Status: AC
  Filled 2021-07-08: qty 50

## 2021-07-08 MED ORDER — IOHEXOL 300 MG/ML  SOLN
75.0000 mL | Freq: Once | INTRAMUSCULAR | Status: AC | PRN
  Administered 2021-07-08: 75 mL via INTRAVENOUS

## 2021-07-12 ENCOUNTER — Telehealth: Payer: Self-pay | Admitting: Radiation Oncology

## 2021-07-12 NOTE — Telephone Encounter (Signed)
I called the patient's daughter to share of the reassuring results from the CT scan, and the recommendation to repeat his scan in 6 months time.  I offered to move up his Monday appointment to today to document this however the patient's daughter states that he is actually been diagnosed with skin cancer not a great candidate for surgery because of overlap in his BiPAP mask that he has to wear.  The other alternative would be topical 5-FU.  We will change the appointment to Tuesday next week so Dr. Lisbeth Renshaw can evaluate him I asked her to bring the BiPAP mask, and I have messaged our staff to coordinate office notes and pathology reports from Dr. Hubbard Hartshorn office.

## 2021-07-17 ENCOUNTER — Other Ambulatory Visit: Payer: Self-pay | Admitting: Neurology

## 2021-07-18 ENCOUNTER — Ambulatory Visit: Payer: Self-pay | Admitting: Radiation Oncology

## 2021-07-18 ENCOUNTER — Telehealth: Payer: Self-pay

## 2021-07-18 DIAGNOSIS — C44301 Unspecified malignant neoplasm of skin of nose: Secondary | ICD-10-CM | POA: Insufficient documentation
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
## 2021-07-18 NOTE — Telephone Encounter (Signed)
Spoke w/ patient, verified identity, and reminded patient of his 8:00am-07/19/21 in-person appointment w/ Shona Simpson PA-C. I left my extension [phone] in case patient needs anything. Patient verbalized understanding of information.

## 2021-07-19 ENCOUNTER — Other Ambulatory Visit: Payer: Self-pay

## 2021-07-19 ENCOUNTER — Encounter: Payer: Self-pay | Admitting: Radiation Oncology

## 2021-07-19 ENCOUNTER — Ambulatory Visit
Admission: RE | Admit: 2021-07-19 | Discharge: 2021-07-19 | Disposition: A | Discharge status: Home / Self Care | Payer: Medicare Other | Source: Ambulatory Visit | Attending: Radiation Oncology | Admitting: Radiation Oncology

## 2021-07-19 VITALS — BP 142/86 | HR 83 | Temp 96.3°F | Resp 18 | Ht 72.0 in | Wt 206.2 lb

## 2021-07-19 DIAGNOSIS — Z801 Family history of malignant neoplasm of trachea, bronchus and lung: Secondary | ICD-10-CM | POA: Insufficient documentation

## 2021-07-19 DIAGNOSIS — Z79899 Other long term (current) drug therapy: Secondary | ICD-10-CM | POA: Insufficient documentation

## 2021-07-19 DIAGNOSIS — C3431 Malignant neoplasm of lower lobe, right bronchus or lung: Secondary | ICD-10-CM

## 2021-07-19 DIAGNOSIS — Z87891 Personal history of nicotine dependence: Secondary | ICD-10-CM | POA: Insufficient documentation

## 2021-07-19 DIAGNOSIS — E7800 Pure hypercholesterolemia, unspecified: Secondary | ICD-10-CM | POA: Diagnosis not present

## 2021-07-19 DIAGNOSIS — I4891 Unspecified atrial fibrillation: Secondary | ICD-10-CM | POA: Insufficient documentation

## 2021-07-19 DIAGNOSIS — I2510 Atherosclerotic heart disease of native coronary artery without angina pectoris: Secondary | ICD-10-CM | POA: Insufficient documentation

## 2021-07-19 DIAGNOSIS — Z9981 Dependence on supplemental oxygen: Secondary | ICD-10-CM | POA: Insufficient documentation

## 2021-07-19 DIAGNOSIS — C44301 Unspecified malignant neoplasm of skin of nose: Secondary | ICD-10-CM | POA: Diagnosis not present

## 2021-07-19 DIAGNOSIS — Z86010 Personal history of colonic polyps: Secondary | ICD-10-CM | POA: Insufficient documentation

## 2021-07-19 DIAGNOSIS — R918 Other nonspecific abnormal finding of lung field: Secondary | ICD-10-CM | POA: Diagnosis not present

## 2021-07-19 DIAGNOSIS — Z806 Family history of leukemia: Secondary | ICD-10-CM | POA: Diagnosis not present

## 2021-07-19 DIAGNOSIS — Z08 Encounter for follow-up examination after completed treatment for malignant neoplasm: Secondary | ICD-10-CM | POA: Diagnosis not present

## 2021-07-19 DIAGNOSIS — J449 Chronic obstructive pulmonary disease, unspecified: Secondary | ICD-10-CM | POA: Diagnosis not present

## 2021-07-19 DIAGNOSIS — I10 Essential (primary) hypertension: Secondary | ICD-10-CM | POA: Diagnosis not present

## 2021-07-19 DIAGNOSIS — F0390 Unspecified dementia without behavioral disturbance: Secondary | ICD-10-CM | POA: Diagnosis not present

## 2021-07-19 DIAGNOSIS — K219 Gastro-esophageal reflux disease without esophagitis: Secondary | ICD-10-CM | POA: Diagnosis not present

## 2021-07-19 DIAGNOSIS — Z800 Family history of malignant neoplasm of digestive organs: Secondary | ICD-10-CM | POA: Diagnosis not present

## 2021-07-19 NOTE — Progress Notes (Addendum)
Spoke w/ patient, verified identity, and began nursing interview w/ daughter [name] in attendance. Patient reports skin irritation to tip of nose. No other issues reported at this time in relation to skin issues.  Meaningful use complete.  BP (!) 142/86 (BP Location: Left Arm, Patient Position: Sitting, Cuff Size: Normal)    Pulse 83    Temp (!) 96.3 F (35.7 C) (Temporal)    Resp 18    Ht 6' (1.829 m)    Wt 206 lb 4 oz (93.6 kg)    SpO2 99%    BMI 27.97 kg/m

## 2021-07-19 NOTE — Addendum Note (Signed)
Encounter addended by: Mollie Germany, LPN on: 0/34/9611 6:43 AM  Actions taken: Visit diagnoses modified

## 2021-07-21 ENCOUNTER — Other Ambulatory Visit: Payer: Self-pay | Admitting: Neurology

## 2021-07-22 ENCOUNTER — Telehealth: Payer: Self-pay | Admitting: Neurology

## 2021-07-22 MED ORDER — PRAMIPEXOLE DIHYDROCHLORIDE 1 MG PO TABS: 1.0000 mg | ORAL_TABLET | Freq: Two times a day (BID) | ORAL | 0 refills | Status: DC

## 2021-07-22 NOTE — Telephone Encounter (Signed)
Pt has called and scheduled his needed f/u and is on wait list for an earlier appointment.  Pt is asking for a call re: him being down for a few days worth of his pramipexole (MIRAPEX) 1 MG tablet ?

## 2021-07-22 NOTE — Telephone Encounter (Addendum)
I called the patient back. He has pending appt 09/14/21. Refills sent to last until this appt. He understands he must come to the follow up for further prescriptions.  ?

## 2021-08-19 DIAGNOSIS — I10 Essential (primary) hypertension: Secondary | ICD-10-CM | POA: Diagnosis not present

## 2021-08-19 DIAGNOSIS — F0390 Unspecified dementia without behavioral disturbance: Secondary | ICD-10-CM | POA: Diagnosis not present

## 2021-08-19 DIAGNOSIS — I480 Paroxysmal atrial fibrillation: Secondary | ICD-10-CM | POA: Diagnosis not present

## 2021-08-19 DIAGNOSIS — J449 Chronic obstructive pulmonary disease, unspecified: Secondary | ICD-10-CM | POA: Diagnosis not present

## 2021-08-22 DIAGNOSIS — C44311 Basal cell carcinoma of skin of nose: Secondary | ICD-10-CM | POA: Diagnosis not present

## 2021-09-09 DIAGNOSIS — J449 Chronic obstructive pulmonary disease, unspecified: Secondary | ICD-10-CM | POA: Diagnosis not present

## 2021-09-09 DIAGNOSIS — E7800 Pure hypercholesterolemia, unspecified: Secondary | ICD-10-CM | POA: Diagnosis not present

## 2021-09-09 DIAGNOSIS — I480 Paroxysmal atrial fibrillation: Secondary | ICD-10-CM | POA: Diagnosis not present

## 2021-09-09 DIAGNOSIS — E538 Deficiency of other specified B group vitamins: Secondary | ICD-10-CM | POA: Diagnosis not present

## 2021-09-09 DIAGNOSIS — C92Z1 Other myeloid leukemia, in remission: Secondary | ICD-10-CM | POA: Diagnosis not present

## 2021-09-09 DIAGNOSIS — Z7901 Long term (current) use of anticoagulants: Secondary | ICD-10-CM | POA: Diagnosis not present

## 2021-09-09 DIAGNOSIS — I700 Atherosclerosis of aorta: Secondary | ICD-10-CM | POA: Diagnosis not present

## 2021-09-09 DIAGNOSIS — F0390 Unspecified dementia without behavioral disturbance: Secondary | ICD-10-CM | POA: Diagnosis not present

## 2021-09-09 DIAGNOSIS — D692 Other nonthrombocytopenic purpura: Secondary | ICD-10-CM | POA: Diagnosis not present

## 2021-09-09 DIAGNOSIS — Z1331 Encounter for screening for depression: Secondary | ICD-10-CM | POA: Diagnosis not present

## 2021-09-09 DIAGNOSIS — I10 Essential (primary) hypertension: Secondary | ICD-10-CM | POA: Diagnosis not present

## 2021-09-09 DIAGNOSIS — C3431 Malignant neoplasm of lower lobe, right bronchus or lung: Secondary | ICD-10-CM | POA: Diagnosis not present

## 2021-09-09 DIAGNOSIS — Z1339 Encounter for screening examination for other mental health and behavioral disorders: Secondary | ICD-10-CM | POA: Diagnosis not present

## 2021-09-09 DIAGNOSIS — D6869 Other thrombophilia: Secondary | ICD-10-CM | POA: Diagnosis not present

## 2021-09-13 DIAGNOSIS — L244 Irritant contact dermatitis due to drugs in contact with skin: Secondary | ICD-10-CM | POA: Diagnosis not present

## 2021-09-13 DIAGNOSIS — C44311 Basal cell carcinoma of skin of nose: Secondary | ICD-10-CM | POA: Diagnosis not present

## 2021-09-14 ENCOUNTER — Encounter: Payer: Self-pay | Admitting: Neurology

## 2021-09-14 ENCOUNTER — Ambulatory Visit (INDEPENDENT_AMBULATORY_CARE_PROVIDER_SITE_OTHER): Payer: Medicare Other | Admitting: Neurology

## 2021-09-14 ENCOUNTER — Telehealth: Payer: Self-pay

## 2021-09-14 VITALS — BP 120/55 | HR 79 | Ht 72.0 in | Wt 193.5 lb

## 2021-09-14 DIAGNOSIS — G2581 Restless legs syndrome: Secondary | ICD-10-CM | POA: Diagnosis not present

## 2021-09-14 DIAGNOSIS — R269 Unspecified abnormalities of gait and mobility: Secondary | ICD-10-CM | POA: Diagnosis not present

## 2021-09-14 MED ORDER — PRAMIPEXOLE DIHYDROCHLORIDE 1 MG PO TABS: 1.0000 mg | ORAL_TABLET | Freq: Two times a day (BID) | ORAL | 1 refills | Status: DC

## 2021-09-14 NOTE — Telephone Encounter (Signed)
Order for POC and O2 concentrator were faxed to [facility]  ?
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
## 2021-09-14 NOTE — Patient Instructions (Signed)
Great to see you today  ?Continue the Mirapex, continue your exercises  ?See you primary care doctor  ?Return back here as needed  ?

## 2021-10-05 ENCOUNTER — Ambulatory Visit (INDEPENDENT_AMBULATORY_CARE_PROVIDER_SITE_OTHER): Payer: Medicare Other | Admitting: Emergency Medicine

## 2021-10-05 ENCOUNTER — Encounter: Payer: Self-pay | Admitting: Emergency Medicine

## 2021-10-05 DIAGNOSIS — J9611 Chronic respiratory failure with hypoxia: Secondary | ICD-10-CM

## 2021-10-05 DIAGNOSIS — J301 Allergic rhinitis due to pollen: Secondary | ICD-10-CM | POA: Diagnosis not present

## 2021-10-05 DIAGNOSIS — J449 Chronic obstructive pulmonary disease, unspecified: Secondary | ICD-10-CM

## 2021-10-05 DIAGNOSIS — J9612 Chronic respiratory failure with hypercapnia: Secondary | ICD-10-CM | POA: Diagnosis not present

## 2021-10-05 DIAGNOSIS — J309 Allergic rhinitis, unspecified: Secondary | ICD-10-CM | POA: Insufficient documentation

## 2021-10-05 DIAGNOSIS — C3431 Malignant neoplasm of lower lobe, right bronchus or lung: Secondary | ICD-10-CM

## 2021-10-05 NOTE — Assessment & Plan Note (Signed)
Stable, reassuring CT chest, following with radiation oncology ? ?Get your repeat CT scan of the chest as planned by radiation oncology ?

## 2021-10-05 NOTE — Progress Notes (Signed)
? ?Subjective:  ? ? Patient ID: Todd Lloyd, male    DOB: 1940-08-31, 81 y.o.   MRN: [id number] ? ?Shortness of Breath ?Associated symptoms include chest pain. Pertinent negatives include no congestion, coughing, fever, headaches, joint swelling, nausea, rash, sore throat or vomiting.  ? ?ROV 01/12/21 --Todd Lloyd is 7 and follows up today for his history of severe COPD, SBRT for a hypermetabolic 1.7 cm right lower lobe pulmonary nodule, chronic respiratory failure on oxygen at, also BiPAP nightly.  He is using both reliably.  We titrated him to 3 L/min pulsed at his last visit.  He is having some difficulty with the BiPAP, is wearing the mask but he is having a lot of saliva that seems to pool. He is using humidity in the BiPAP. Needs documentation that he will require the BiPAP lifelong. On stiolto, Asmanex. Has some intermittent cough, ? Sometimes associated with saliva or after a meal.  ?COVID vaccine up to date.  ? ?Todd Lloyd is reliable with wearing his BiPAP although he is having some difficulty with his saliva, secretions.  He wears it every night.  He is clinically benefiting from it.  Improve daytime breathing and wakefulness, improved overall functional capacity and sleep quality.  He will need this lifelong due to his chronic respiratory failure and chronic hypercapnia, hypoxemia. ? ?CT chest 01/06/21 reviewed by me, unchanged post-treatment, stable 81mm nodule LLL ? ? ?ROV 10/05/2021 --81 year old gentleman with a history of severe COPD and associated chronic respiratory failure requiring supplemental oxygen 3 L/min and also nocturnal BiPAP.  He has a history of SBRT for a hypermetabolic 1.7 cm right lower lobe pulmonary nodule.  Currently managed on Stiolto, Asmanex.  ?He notes that has some non-compliance with exertional O2. Family has noted that he will fall asleep on RA when at rest. He has exertional SOB even w the O2 on. Had more trouble during the pollen season. He has dry cough, some UA irritation. On  protonix.  ? ?CT chest 07/08/2021 reviewed by me shows stable radiation changes surrounding a 1.5 cm right lower lobe nodule.  No evidence of recurrence. ? ? ?Review of Systems  ?Constitutional:  Negative for fever and unexpected weight change.  ?HENT:  Negative for congestion, dental problem, ear pain, nosebleeds, postnasal drip, rhinorrhea, sinus pressure, sneezing, sore throat and trouble swallowing.   ?Eyes:  Negative for redness and itching.  ?Respiratory:  Positive for shortness of breath. Negative for cough, chest tightness and wheezing.   ?Cardiovascular:  Positive for chest pain. Negative for palpitations and leg swelling.  ?Gastrointestinal:  Negative for nausea and vomiting.  ?Genitourinary:  Negative for dysuria.  ?Musculoskeletal:  Negative for joint swelling.  ?Skin:  Negative for rash.  ?Neurological:  Negative for headaches.  ?Hematological:  Does not bruise/bleed easily.  ?Psychiatric/Behavioral:  Negative for dysphoric mood. The patient is not nervous/anxious.   ? ? ?   ?Objective:  ? Physical Exam ?Vitals:  ? 10/05/21 1138  ?BP: 138/78  ?Pulse: 78  ?Temp: 98.4 ?F (36.9 ?C)  ?TempSrc: Oral  ?SpO2: 98%  ?Weight: 201 lb 3.2 oz (91.3 kg)  ?Height: 6' (1.829 m)  ? ?Gen: Pleasant, elderly man, in no distress,  normal affect, using a walker ? ?ENT: No lesions,  mouth clear,  oropharynx clear, no postnasal drip ? ?Neck: No JVD, no stridor ? ?Lungs: No use of accessory muscles, distant, no wheeze on a normal breath.  Wheeze B on a forced exp.  ? ?Cardiovascular: RRR, heart sounds normal, no  murmur or gallops, no peripheral edema ? ?Musculoskeletal: No deformities, no cyanosis or clubbing ? ?Neuro: alert, awake, interacting appropriately, well oriented ? ?Skin: Warm, no lesions or rashes ? ? ?   ?Assessment & Plan:  ?COPD (chronic obstructive pulmonary disease) ([place]) ?Please continue Stiolto 2 puffs once daily. ?Continue Asmanex as you have been taking it.  Rinse and gargle after using. ?Keep your albuterol  available to use 2 puffs or 1 nebulizer treatment up to every 4 hours if needed for shortness of breath, chest tightness, wheezing. ?Follow with APP in 6 months ?Follow Dr. Lamonte Sakai in 12 months or sooner if you have any problems. ? ?Malignant neoplasm of lower lobe of right lung ([place]) ?Stable, reassuring CT chest, following with radiation oncology ? ?Get your repeat CT scan of the chest as planned by radiation oncology ? ?Chronic respiratory failure with hypoxia and hypercapnia (HCC) ?Continued marginal compliance with his exertional oxygen.  He is forgetful, gets up without it.  He does notice more shortness of breath and has seen desaturations when he ambulates, exerts himself on room air.  Good compliance with his BiPAP although he does have some difficulty with humidity and secretions. ? ? ?Continue your oxygen at 3 L/min with exertion.  Our goal is to keep your saturations above 90% ?Wear your BiPAP at night while sleeping and use while napping if possible. ? ? ? ? ?Allergic rhinitis ?Consider starting loratadine 10 mg (generic Claritin) once daily if you continue to have cough, nasal congestion. ? ?Todd Apo, MD, PhD ?10/05/2021, 12:46 PM ?[facility] ?[phone] or if no answer [phone] ? ?

## 2021-10-05 NOTE — Assessment & Plan Note (Signed)
Consider starting loratadine 10 mg (generic Claritin) once daily if you continue to have cough, nasal congestion. ?

## 2021-10-05 NOTE — Assessment & Plan Note (Signed)
Please continue Stiolto 2 puffs once daily. ?Continue Asmanex as you have been taking it.  Rinse and gargle after using. ?Keep your albuterol available to use 2 puffs or 1 nebulizer treatment up to every 4 hours if needed for shortness of breath, chest tightness, wheezing. ?Follow with APP in 6 months ?Follow Dr. Lamonte Sakai in 12 months or sooner if you have any problems. ?

## 2021-10-05 NOTE — Patient Instructions (Signed)
Please continue Stiolto 2 puffs once daily. ?Continue Asmanex as you have been taking it.  Rinse and gargle after using. ?Keep your albuterol available to use 2 puffs or 1 nebulizer treatment up to every 4 hours if needed for shortness of breath, chest tightness, wheezing. ?Continue your oxygen at 3 L/min with exertion.  Our goal is to keep your saturations above 90% ?Wear your BiPAP at night while sleeping and use while napping if possible. ?Consider starting loratadine 10 mg (generic Claritin) once daily if you continue to have cough, nasal congestion. ?Get your repeat CT scan of the chest as planned by radiation oncology ?Follow with APP in 6 months ?Follow Dr. Lamonte Sakai in 12 months or sooner if you have any problems. ?

## 2021-10-05 NOTE — Assessment & Plan Note (Signed)
Continued marginal compliance with his exertional oxygen.  He is forgetful, gets up without it.  He does notice more shortness of breath and has seen desaturations when he ambulates, exerts himself on room air.  Good compliance with his BiPAP although he does have some difficulty with humidity and secretions. ? ? ?Continue your oxygen at 3 L/min with exertion.  Our goal is to keep your saturations above 90% ?Wear your BiPAP at night while sleeping and use while napping if possible. ? ? ? ?

## 2021-12-07 DIAGNOSIS — M5417 Radiculopathy, lumbosacral region: Secondary | ICD-10-CM | POA: Diagnosis not present

## 2021-12-07 DIAGNOSIS — M791 Myalgia, unspecified site: Secondary | ICD-10-CM | POA: Diagnosis not present

## 2021-12-07 DIAGNOSIS — C3431 Malignant neoplasm of lower lobe, right bronchus or lung: Secondary | ICD-10-CM | POA: Diagnosis not present

## 2021-12-07 DIAGNOSIS — M545 Low back pain, unspecified: Secondary | ICD-10-CM | POA: Diagnosis not present

## 2022-01-02 ENCOUNTER — Telehealth: Payer: Self-pay | Admitting: *Deleted

## 2022-01-02 NOTE — Telephone Encounter (Signed)
Called patient to inform of CT for 01-16-22- arrival time- 9:30 am @ [facility], patient to have liquids only - 4 hrs. prior to test, labs to be drawn I-Stat in Radiology, patient to receive results from Shona Simpson on 01-24-22 @ 8:30 am via telephone , lvm for a return call

## 2022-01-03 ENCOUNTER — Telehealth: Payer: Self-pay | Admitting: *Deleted

## 2022-01-03 NOTE — Telephone Encounter (Signed)
Called patient to inform of scan and telephone fu, lvm for his Todd Lloyd to return call

## 2022-01-05 ENCOUNTER — Ambulatory Visit: Payer: Medicare Other | Admitting: Neurology

## 2022-01-09 ENCOUNTER — Telehealth: Payer: Self-pay | Admitting: Emergency Medicine

## 2022-01-09 DIAGNOSIS — J9612 Chronic respiratory failure with hypercapnia: Secondary | ICD-10-CM

## 2022-01-09 NOTE — Telephone Encounter (Signed)
Called and spoke to daughter and she did confirm that she wanted a POC order sent to [facility]. No longer using [employer]. Order sent in. Nothing further needed

## 2022-01-16 ENCOUNTER — Ambulatory Visit (HOSPITAL_COMMUNITY)
Admission: RE | Admit: 2022-01-16 | Discharge: 2022-01-16 | Disposition: A | Discharge status: Home / Self Care | Payer: Medicare Other | Source: Ambulatory Visit | Attending: Radiation Oncology | Admitting: Radiation Oncology

## 2022-01-16 DIAGNOSIS — J432 Centrilobular emphysema: Secondary | ICD-10-CM | POA: Diagnosis not present

## 2022-01-16 DIAGNOSIS — C3431 Malignant neoplasm of lower lobe, right bronchus or lung: Secondary | ICD-10-CM | POA: Insufficient documentation

## 2022-01-16 DIAGNOSIS — R911 Solitary pulmonary nodule: Secondary | ICD-10-CM | POA: Diagnosis not present

## 2022-01-16 LAB — POCT I-STAT CREATININE: Creatinine, Ser: 1 mg/dL (ref 0.61–1.24)

## 2022-01-16 MED ORDER — SODIUM CHLORIDE (PF) 0.9 % IJ SOLN
INTRAMUSCULAR | Status: AC
Start: 2022-01-16 — End: 2022-01-16
  Filled 2022-01-16: qty 50

## 2022-01-16 MED ORDER — IOHEXOL 300 MG/ML  SOLN
80.0000 mL | Freq: Once | INTRAMUSCULAR | Status: AC | PRN
  Administered 2022-01-16: 75 mL via INTRAVENOUS

## 2022-01-17 ENCOUNTER — Telehealth: Payer: Self-pay | Admitting: Radiation Oncology

## 2022-01-17 NOTE — Telephone Encounter (Signed)
I called the patient's daughter Ms. White to review his CT scan. I didn't want them to have to wait til next week to hear results. I left a voicemail letting her know I'd try her again tomorrow.

## 2022-01-18 ENCOUNTER — Telehealth: Payer: Self-pay | Admitting: Radiation Oncology

## 2022-01-18 DIAGNOSIS — C3431 Malignant neoplasm of lower lobe, right bronchus or lung: Secondary | ICD-10-CM

## 2022-01-18 NOTE — Telephone Encounter (Signed)
The patient's daughter called back. We reviewed his CT scan and the findings of more scarring in the location of his treatment field. Dr. Lisbeth Renshaw recommends repeat imaging in 4 months and she is in agreement. Todd Lloyd suffers from dementia despite good physical condition. We will follow up with her as well at the time of his next scan.

## 2022-01-24 ENCOUNTER — Ambulatory Visit: Payer: Self-pay | Admitting: Radiation Oncology

## 2022-02-03 ENCOUNTER — Telehealth: Payer: Self-pay | Admitting: Cardiovascular Disease

## 2022-02-03 NOTE — Telephone Encounter (Signed)
Spoke with daughter and suggested patient assistance She stated didn't think mother quite understood and father would not do it  She will discuss with them again

## 2022-02-03 NOTE — Telephone Encounter (Signed)
   Patient calling the office for samples of medication:   1.  What medication and dosage are you requesting samples for?apixaban (ELIQUIS) 5 MG TABS tablet  2.  Are you currently out of this medication? Yes     Pt said, him and his wife is on a donut hole and requesting samples. He said, they cant afford to pay eliquis in full

## 2022-02-23 DIAGNOSIS — D225 Melanocytic nevi of trunk: Secondary | ICD-10-CM | POA: Diagnosis not present

## 2022-02-23 DIAGNOSIS — C44311 Basal cell carcinoma of skin of nose: Secondary | ICD-10-CM | POA: Diagnosis not present

## 2022-03-01 DIAGNOSIS — E538 Deficiency of other specified B group vitamins: Secondary | ICD-10-CM | POA: Diagnosis not present

## 2022-03-01 DIAGNOSIS — E7800 Pure hypercholesterolemia, unspecified: Secondary | ICD-10-CM | POA: Diagnosis not present

## 2022-03-01 DIAGNOSIS — Z125 Encounter for screening for malignant neoplasm of prostate: Secondary | ICD-10-CM | POA: Diagnosis not present

## 2022-03-01 DIAGNOSIS — R7989 Other specified abnormal findings of blood chemistry: Secondary | ICD-10-CM | POA: Diagnosis not present

## 2022-03-01 DIAGNOSIS — I10 Essential (primary) hypertension: Secondary | ICD-10-CM | POA: Diagnosis not present

## 2022-03-08 DIAGNOSIS — D692 Other nonthrombocytopenic purpura: Secondary | ICD-10-CM | POA: Diagnosis not present

## 2022-03-08 DIAGNOSIS — Z23 Encounter for immunization: Secondary | ICD-10-CM | POA: Diagnosis not present

## 2022-03-08 DIAGNOSIS — Z1331 Encounter for screening for depression: Secondary | ICD-10-CM | POA: Diagnosis not present

## 2022-03-08 DIAGNOSIS — M5417 Radiculopathy, lumbosacral region: Secondary | ICD-10-CM | POA: Diagnosis not present

## 2022-03-08 DIAGNOSIS — D6869 Other thrombophilia: Secondary | ICD-10-CM | POA: Diagnosis not present

## 2022-03-08 DIAGNOSIS — M545 Low back pain, unspecified: Secondary | ICD-10-CM | POA: Diagnosis not present

## 2022-03-08 DIAGNOSIS — Z0000 Encounter for general adult medical examination without abnormal findings: Secondary | ICD-10-CM | POA: Diagnosis not present

## 2022-03-08 DIAGNOSIS — F0390 Unspecified dementia without behavioral disturbance: Secondary | ICD-10-CM | POA: Diagnosis not present

## 2022-03-08 DIAGNOSIS — I10 Essential (primary) hypertension: Secondary | ICD-10-CM | POA: Diagnosis not present

## 2022-03-08 DIAGNOSIS — C3431 Malignant neoplasm of lower lobe, right bronchus or lung: Secondary | ICD-10-CM | POA: Diagnosis not present

## 2022-03-08 DIAGNOSIS — Z1339 Encounter for screening examination for other mental health and behavioral disorders: Secondary | ICD-10-CM | POA: Diagnosis not present

## 2022-03-08 DIAGNOSIS — J449 Chronic obstructive pulmonary disease, unspecified: Secondary | ICD-10-CM | POA: Diagnosis not present

## 2022-03-08 DIAGNOSIS — I480 Paroxysmal atrial fibrillation: Secondary | ICD-10-CM | POA: Diagnosis not present

## 2022-03-08 DIAGNOSIS — M791 Myalgia, unspecified site: Secondary | ICD-10-CM | POA: Diagnosis not present

## 2022-03-08 DIAGNOSIS — C92Z1 Other myeloid leukemia, in remission: Secondary | ICD-10-CM | POA: Diagnosis not present

## 2022-03-08 DIAGNOSIS — I700 Atherosclerosis of aorta: Secondary | ICD-10-CM | POA: Diagnosis not present

## 2022-03-08 DIAGNOSIS — Z7901 Long term (current) use of anticoagulants: Secondary | ICD-10-CM | POA: Diagnosis not present

## 2022-03-08 DIAGNOSIS — R82998 Other abnormal findings in urine: Secondary | ICD-10-CM | POA: Diagnosis not present

## 2022-04-06 DIAGNOSIS — C44311 Basal cell carcinoma of skin of nose: Secondary | ICD-10-CM | POA: Diagnosis not present

## 2022-04-06 DIAGNOSIS — L218 Other seborrheic dermatitis: Secondary | ICD-10-CM | POA: Diagnosis not present

## 2022-04-07 ENCOUNTER — Ambulatory Visit (INDEPENDENT_AMBULATORY_CARE_PROVIDER_SITE_OTHER): Payer: Medicare Other | Admitting: Adult Health

## 2022-04-07 ENCOUNTER — Encounter: Payer: Self-pay | Admitting: Adult Health

## 2022-04-07 ENCOUNTER — Telehealth: Payer: Self-pay | Admitting: *Deleted

## 2022-04-07 VITALS — BP 108/68 | HR 78 | Temp 97.7°F | Ht 72.0 in | Wt 208.6 lb

## 2022-04-07 DIAGNOSIS — J9612 Chronic respiratory failure with hypercapnia: Secondary | ICD-10-CM

## 2022-04-07 DIAGNOSIS — J9611 Chronic respiratory failure with hypoxia: Secondary | ICD-10-CM | POA: Diagnosis not present

## 2022-04-07 DIAGNOSIS — R911 Solitary pulmonary nodule: Secondary | ICD-10-CM | POA: Diagnosis not present

## 2022-04-07 DIAGNOSIS — J449 Chronic obstructive pulmonary disease, unspecified: Secondary | ICD-10-CM

## 2022-04-07 NOTE — Telephone Encounter (Signed)
Called [facility] to request that our office be tagged in his airview account and advised that the patient is in the office for a visit today.  She verified understanding.  Will await access to account in airview.

## 2022-04-07 NOTE — Progress Notes (Signed)
@Patient  ID: Todd Lloyd, male    DOB: [birth year], 81 y.o.   MRN: [id number]  Chief Complaint  Patient presents with   Follow-up    Referring provider: Haywood Pao, MD  HPI: 81 year old male former smoker followed for severe COPD, suspicious right lower lobe hypermetabolic pulmonary nodule status post SBRT, chronic respiratory failure on oxygen and nocturnal BiPAP Medical history significant for dementia, leukemia status post bone marrow transplant in 1990  TEST/EVENTS :  CT chest 07/08/2021 reviewed by me shows stable radiation changes surrounding a 1.5 cm right lower lobe nodule.  No evidence of recurrence.   CT chest January 16, 2022 showed treated right lower pulmonary nodules unchanged in size, nearly large surrounding bandlike and irregular consolidation associated with volume loss suspected to be radiation fibrosis  04/07/2022 Follow up: COPD, hypermetabolic pulmonary nodule, chronic respiratory failure Patient presents for a 65-month follow-up.  Patient is accompanied by family member.  Patient has underlying severe COPD that is oxygen dependent.  He also had a hypermetabolic pulmonary nodule status post SBRT.  Most recent CT chest August 28 showed unchanged right lower lobe pulmonary nodules, bandlike irregular consolidation possible radiation fibrosis.  He is followed by radiation oncology and will have a repeat CT in 4 months. Patient remains on Stiolto daily along with Asmanex.  He remains on oxygen 2 to 3 L.  Uses BiPAP at bedtime.  BiPAP shows excellent compliance with 100% usage.  Daily average usage at 8 hours.  Patient is on auto BiPAP IPAP max 14 and EPAP minimum 6 cm H2O.  AHI 4.6/hour. Denies any chest pain orthopnea edema or hemoptysis.  Allergies  Allergen Reactions   Lyrica [Pregabalin] Other (See Comments)    confusion   Morphine And Related Other (See Comments)    confusion   Simvastatin Other (See Comments)    Muscle pain - reported by [facility] 2019     Immunization History  Administered Date(s) Administered   DTaP, 5 pertussis antigens 05/30/2017   Fluad Quad(high Dose 65+) 02/11/2020   Influenza Split 04/19/2009, 03/22/2011, 03/19/2012, 02/09/2014   Influenza, High Dose Seasonal PF 02/07/2013, 02/19/2013, 02/19/2014, 02/20/2015, 02/20/2016, 03/25/2016, 03/22/2018, 03/08/2022   Influenza, Quadrivalent, Recombinant, Inj, Pf 01/16/2018, 03/26/2019   Influenza,inj,Quad PF,6+ Mos 02/09/2014, 03/23/2015   Influenza-Unspecified 01/20/2014, 03/17/2017   PFIZER(Purple Top)SARS-COV-2 Vaccination 07/26/2019, 08/18/2019, 05/07/2020   Pneumococcal Conjugate-13 05/06/2013, 06/11/2014   Pneumococcal Polysaccharide-23 04/21/2013   Pneumococcal-Unspecified 04/10/2008, 04/19/2009   Tdap 05/22/2006, 05/30/2017    Past Medical History:  Diagnosis Date   Acid reflux    Atrial fibrillation (HCC)    CAD in native artery 02/14/2021   Chronic low back pain 07/16/2018   Colon polyps    COPD (chronic obstructive pulmonary disease) ([place])    Dysrhythmia    afib   Gait abnormality 09/12/2016   High cholesterol    Hypertension    Leukemia ([place])    s/p bone marrow transplant in 1990   Memory difficulties 09/12/2016   Peripheral neuropathy 10/18/2016   RLS (restless legs syndrome) 10/18/2016    Tobacco History: Social History   Tobacco Use  Smoking Status Former   Packs/day: 2.00   Years: 43.00   Total pack years: 86.00   Types: Cigarettes   Quit date: 05/22/2000   Years since quitting: 21.8  Smokeless Tobacco Never  Tobacco Comments   Counseled to remain smoke free   Counseling given: Not Answered Tobacco comments: Counseled to remain smoke free   Outpatient Medications Prior to Visit  Medication Sig Dispense Refill   albuterol (VENTOLIN HFA) 108 (90 Base) MCG/ACT inhaler Inhale 2 puffs into the lungs every 6 (six) hours as needed for wheezing or shortness of breath.     apixaban (ELIQUIS) 5 MG TABS tablet Take 1 tablet (5 mg total) by  mouth 2 (two) times daily. 180 tablet 2   digoxin (LANOXIN) 0.25 MG tablet Take 0.25 mg by mouth every morning. For afib     donepezil (ARICEPT) 10 MG tablet Take 10 mg by mouth at bedtime. For dementia     ferrous gluconate (FERGON) 324 MG tablet Take 324 mg by mouth daily with breakfast.     furosemide (LASIX) 20 MG tablet Take 10 mg(1/2 tablet) Monday, Wednesday and Friday and 20 mg(1 tablet) all other days.     memantine (NAMENDA) 5 MG tablet Take 5 mg by mouth 2 (two) times daily. For dementia     metoprolol tartrate (LOPRESSOR) 25 MG tablet Take 0.5 tablets (12.5 mg total) by mouth 2 (two) times daily.     mometasone (ASMANEX, 60 METERED DOSES,) 220 MCG/INH inhaler Inhale 2 puffs into the lungs daily. 1 each 12   OXYGEN Inhale 2 L into the lungs daily.     pantoprazole (PROTONIX) 40 MG tablet Take 1 tablet (40 mg total) by mouth daily.     pramipexole (MIRAPEX) 1 MG tablet Take 1 tablet (1 mg total) by mouth 2 (two) times daily. 180 tablet 1   Tiotropium Bromide-Olodaterol (STIOLTO RESPIMAT) 2.5-2.5 MCG/ACT AERS Inhale 2 puffs into the lungs daily. 1 Inhaler 5   No facility-administered medications prior to visit.     Review of Systems:   Constitutional:   No  weight loss, night sweats,  Fevers, chills, + fatigue, or  lassitude.  HEENT:   No headaches,  Difficulty swallowing,  Tooth/dental problems, or  Sore throat,                No sneezing, itching, ear ache, nasal congestion, post nasal drip,   CV:  No chest pain,  Orthopnea, PND, swelling in lower extremities, anasarca, dizziness, palpitations, syncope.   GI  No heartburn, indigestion, abdominal pain, nausea, vomiting, diarrhea, change in bowel habits, loss of appetite, bloody stools.   Resp:   No excess mucus, no productive cough,  No non-productive cough,  No coughing up of blood.  No change in color of mucus.  No wheezing.  No chest wall deformity  Skin: no rash or lesions.  GU: no dysuria, change in color of urine, no  urgency or frequency.  No flank pain, no hematuria   MS:  No joint pain or swelling.  No decreased range of motion.  No back pain.    Physical Exam  BP 108/68 (BP Location: Left Arm, Patient Position: Sitting, Cuff Size: Large)   Pulse 78   Temp 97.7 F (36.5 C) (Oral)   Ht 6' (1.829 m)   Wt 208 lb 9.6 oz (94.6 kg)   BMI 28.29 kg/m   GEN: A/Ox3; pleasant , NAD, well nourished, on O2, elderly   HEENT:  [place]/AT,   NOSE-clear, THROAT-clear, no lesions, no postnasal drip or exudate noted.   NECK:  Supple w/ fair ROM; no JVD; normal carotid impulses w/o bruits; no thyromegaly or nodules palpated; no lymphadenopathy.    RESP  Clear  P & A; w/o, wheezes/ rales/ or rhonchi. no accessory muscle use, no dullness to percussion  CARD:  RRR, no m/r/g, tr peripheral edema, pulses intact, no  cyanosis or clubbing.  GI:   Soft & nt; nml bowel sounds; no organomegaly or masses detected.   Musco: Warm bil, no deformities or joint swelling noted.   Neuro: alert, no focal deficits noted.    Skin: Warm, no lesions or rashes    Lab Results:    BMET   BNP   ProBNP   Imaging: No results found.       Latest Ref Rng & Units 08/25/2020    1:20 PM 04/29/2015   12:48 PM  PFT Results  FVC-Pre L 2.03  2.56   FVC-Predicted Pre % 47  57   FVC-Post L  2.82   FVC-Predicted Post %  63   Pre FEV1/FVC % % 42  57   Post FEV1/FCV % %  59   FEV1-Pre L 0.86  1.45   FEV1-Predicted Pre % 27  44   FEV1-Post L  1.67   DLCO uncorrected ml/min/mmHg 5.01  15.67   DLCO UNC% % 19  45   DLVA Predicted % 68  73   TLC L  6.62   TLC % Predicted %  90   RV % Predicted %  127     No results found for: "NITRICOXIDE"      Assessment & Plan:   COPD (chronic obstructive pulmonary disease) (HCC) Severe COPD appears to be compensated on present regimen.  Patient is encouraged on activity as tolerated.  Plan  Patient Instructions  Please continue Stiolto 2 puffs once daily. Continue Asmanex as  you have been taking it.  Rinse and gargle after using. Keep your albuterol available to use 2 puffs or 1 nebulizer treatment up to every 4 hours if needed for shortness of breath, chest tightness, wheezing. Continue your oxygen at 2-3 L/min with exertion.  Our goal is to keep your saturations above 90% Wear your BiPAP at night while sleeping and use while napping if possible. Follow up with Dr. Lamonte Sakai or Braxten Memmer NP in 6 months and As needed   Please contact office for sooner follow up if symptoms do not improve or worsen or seek emergency care      Chronic respiratory failure with hypoxia and hypercapnia ([place]) Continue on oxygen to maintain O2 saturations greater than 88 to 90%  Solitary pulmonary nodule Suspicious hypermetabolic nodule status post SBRT.  Most recent CT chest August 2023 showed stable nodule and surrounding bandlike consolidation questionable radiation fibrosis.  Has a follow-up CT per radiation oncology next month.     Rexene Edison, NP 04/07/2022

## 2022-04-07 NOTE — Assessment & Plan Note (Signed)
Severe COPD appears to be compensated on present regimen.  Patient is encouraged on activity as tolerated.  Plan  Patient Instructions  Please continue Stiolto 2 puffs once daily. Continue Asmanex as you have been taking it.  Rinse and gargle after using. Keep your albuterol available to use 2 puffs or 1 nebulizer treatment up to every 4 hours if needed for shortness of breath, chest tightness, wheezing. Continue your oxygen at 2-3 L/min with exertion.  Our goal is to keep your saturations above 90% Wear your BiPAP at night while sleeping and use while napping if possible. Follow up with Dr. Lamonte Sakai or Teriana Danker NP in 6 months and As needed   Please contact office for sooner follow up if symptoms do not improve or worsen or seek emergency care

## 2022-04-07 NOTE — Assessment & Plan Note (Signed)
Continue on oxygen to maintain O2 saturations greater than 88 to 90%. 

## 2022-04-07 NOTE — Assessment & Plan Note (Signed)
Suspicious hypermetabolic nodule status post SBRT.  Most recent CT chest August 2023 showed stable nodule and surrounding bandlike consolidation questionable radiation fibrosis.  Has a follow-up CT per radiation oncology next month.

## 2022-04-07 NOTE — Patient Instructions (Addendum)
Please continue Stiolto 2 puffs once daily. Continue Asmanex as you have been taking it.  Rinse and gargle after using. Keep your albuterol available to use 2 puffs or 1 nebulizer treatment up to every 4 hours if needed for shortness of breath, chest tightness, wheezing. Continue your oxygen at 2-3 L/min with exertion.  Our goal is to keep your saturations above 90% Wear your BiPAP at night while sleeping and use while napping if possible. Follow up with Dr. Lamonte Sakai or Nisha Dhami NP in 6 months and As needed   Please contact office for sooner follow up if symptoms do not improve or worsen or seek emergency care

## 2022-04-17 ENCOUNTER — Other Ambulatory Visit (HOSPITAL_COMMUNITY): Payer: Self-pay | Admitting: Registered Nurse

## 2022-04-17 DIAGNOSIS — R1012 Left upper quadrant pain: Secondary | ICD-10-CM

## 2022-04-17 DIAGNOSIS — J449 Chronic obstructive pulmonary disease, unspecified: Secondary | ICD-10-CM | POA: Diagnosis not present

## 2022-04-17 DIAGNOSIS — K439 Ventral hernia without obstruction or gangrene: Secondary | ICD-10-CM

## 2022-04-17 DIAGNOSIS — I480 Paroxysmal atrial fibrillation: Secondary | ICD-10-CM | POA: Diagnosis not present

## 2022-04-17 DIAGNOSIS — Z7901 Long term (current) use of anticoagulants: Secondary | ICD-10-CM | POA: Diagnosis not present

## 2022-04-17 DIAGNOSIS — I10 Essential (primary) hypertension: Secondary | ICD-10-CM | POA: Diagnosis not present

## 2022-04-17 DIAGNOSIS — F0390 Unspecified dementia without behavioral disturbance: Secondary | ICD-10-CM | POA: Diagnosis not present

## 2022-04-18 ENCOUNTER — Ambulatory Visit (HOSPITAL_COMMUNITY)
Admission: RE | Admit: 2022-04-18 | Discharge: 2022-04-18 | Disposition: A | Discharge status: Home / Self Care | Payer: Medicare Other | Source: Ambulatory Visit | Attending: Registered Nurse | Admitting: Registered Nurse

## 2022-04-18 DIAGNOSIS — N281 Cyst of kidney, acquired: Secondary | ICD-10-CM | POA: Diagnosis not present

## 2022-04-18 DIAGNOSIS — N400 Enlarged prostate without lower urinary tract symptoms: Secondary | ICD-10-CM | POA: Diagnosis not present

## 2022-04-18 DIAGNOSIS — R1012 Left upper quadrant pain: Secondary | ICD-10-CM | POA: Insufficient documentation

## 2022-04-18 DIAGNOSIS — K439 Ventral hernia without obstruction or gangrene: Secondary | ICD-10-CM | POA: Diagnosis not present

## 2022-04-18 MED ORDER — IOHEXOL 300 MG/ML  SOLN
100.0000 mL | Freq: Once | INTRAMUSCULAR | Status: AC | PRN
  Administered 2022-04-18: 100 mL via INTRAVENOUS

## 2022-04-23 ENCOUNTER — Other Ambulatory Visit: Payer: Self-pay | Admitting: Neurology

## 2022-05-16 ENCOUNTER — Ambulatory Visit (HOSPITAL_COMMUNITY)
Admission: RE | Admit: 2022-05-16 | Discharge: 2022-05-16 | Disposition: A | Discharge status: Home / Self Care | Payer: Medicare Other | Source: Ambulatory Visit | Attending: Radiation Oncology | Admitting: Radiation Oncology

## 2022-05-16 DIAGNOSIS — C3431 Malignant neoplasm of lower lobe, right bronchus or lung: Secondary | ICD-10-CM | POA: Insufficient documentation

## 2022-05-16 DIAGNOSIS — N179 Acute kidney failure, unspecified: Secondary | ICD-10-CM | POA: Diagnosis not present

## 2022-05-16 DIAGNOSIS — C3490 Malignant neoplasm of unspecified part of unspecified bronchus or lung: Secondary | ICD-10-CM | POA: Diagnosis not present

## 2022-05-16 DIAGNOSIS — J432 Centrilobular emphysema: Secondary | ICD-10-CM | POA: Diagnosis not present

## 2022-05-16 LAB — POCT I-STAT CREATININE: Creatinine, Ser: 1 mg/dL (ref 0.61–1.24)

## 2022-05-16 MED ORDER — IOHEXOL 300 MG/ML  SOLN
100.0000 mL | Freq: Once | INTRAMUSCULAR | Status: AC | PRN
  Administered 2022-05-16: 100 mL via INTRAVENOUS

## 2022-05-16 MED ORDER — SODIUM CHLORIDE (PF) 0.9 % IJ SOLN
INTRAMUSCULAR | Status: AC
  Filled 2022-05-16: qty 50

## 2022-05-26 ENCOUNTER — Encounter: Payer: Self-pay | Admitting: Radiation Oncology

## 2022-05-26 NOTE — Progress Notes (Signed)
Nursing interview for patient to receive most recent scan results from 05/16/22. I spoke w/ patient's daughter Ms. [name], verified her identity and began nursing interview. She reports patient doing well. No issues relayed at this time.  Meaningful use complete.  Patient daughter aware of patient's 1:30pm- 05/29/22 telephone appointment w/ Shona Simpson PA-C. I left my extension [phone] in case patient needs anything. Ms. [name] verbalized understanding.  This concludes the interview.   Leandra Kern, LPN

## 2022-05-29 ENCOUNTER — Ambulatory Visit
Admission: RE | Admit: 2022-05-29 | Discharge: 2022-05-29 | Disposition: A | Discharge status: Home / Self Care | Payer: Medicare Other | Source: Ambulatory Visit | Attending: Radiation Oncology | Admitting: Radiation Oncology

## 2022-05-29 ENCOUNTER — Encounter: Payer: Self-pay | Admitting: Radiation Oncology

## 2022-05-29 DIAGNOSIS — C44311 Basal cell carcinoma of skin of nose: Secondary | ICD-10-CM | POA: Diagnosis not present

## 2022-05-29 DIAGNOSIS — C3431 Malignant neoplasm of lower lobe, right bronchus or lung: Secondary | ICD-10-CM | POA: Diagnosis not present

## 2022-05-29 DIAGNOSIS — Z87891 Personal history of nicotine dependence: Secondary | ICD-10-CM | POA: Diagnosis not present

## 2022-05-29 HISTORY — DX: Unspecified dementia, unspecified severity, without behavioral disturbance, psychotic disturbance, mood disturbance, and anxiety: F03.90
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
## 2022-06-05 ENCOUNTER — Ambulatory Visit (INDEPENDENT_AMBULATORY_CARE_PROVIDER_SITE_OTHER): Payer: Medicare Other | Admitting: Cardiology

## 2022-06-05 ENCOUNTER — Encounter (HOSPITAL_BASED_OUTPATIENT_CLINIC_OR_DEPARTMENT_OTHER): Payer: Self-pay | Admitting: Cardiology

## 2022-06-05 VITALS — BP 118/76 | HR 53 | Ht 72.0 in | Wt 216.9 lb

## 2022-06-05 DIAGNOSIS — I482 Chronic atrial fibrillation, unspecified: Secondary | ICD-10-CM | POA: Diagnosis not present

## 2022-06-05 DIAGNOSIS — Z79899 Other long term (current) drug therapy: Secondary | ICD-10-CM | POA: Diagnosis not present

## 2022-06-05 DIAGNOSIS — D6859 Other primary thrombophilia: Secondary | ICD-10-CM

## 2022-06-05 DIAGNOSIS — I10 Essential (primary) hypertension: Secondary | ICD-10-CM | POA: Diagnosis not present

## 2022-06-05 DIAGNOSIS — E782 Mixed hyperlipidemia: Secondary | ICD-10-CM | POA: Diagnosis not present

## 2022-06-05 DIAGNOSIS — I2510 Atherosclerotic heart disease of native coronary artery without angina pectoris: Secondary | ICD-10-CM | POA: Diagnosis not present

## 2022-06-05 MED ORDER — PRAVASTATIN SODIUM 20 MG PO TABS: 20.0000 mg | ORAL_TABLET | Freq: Every evening | ORAL | 5 refills | Status: DC

## 2022-06-05 NOTE — Patient Instructions (Signed)
Medication Instructions:   START Pravastatin 20 mg daily  *If you need a refill on your cardiac medications before your next appointment, please call your pharmacy*   Lab Work: Your physician recommends that you return for lab work tomorrow morning: CBC, Digoxin Level  If you have labs (blood work) drawn today and your tests are completely normal, you will receive your results only by: MyChart Message (if you have MyChart) OR A paper copy in the mail If you have any lab test that is abnormal or we need to change your treatment, we will call you to review the results.   Testing/Procedures: None   Follow-Up: At [facility], you and your health needs are our priority.  As part of our continuing mission to provide you with exceptional heart care, we have created designated Provider Care Teams.  These Care Teams include your primary Cardiologist (physician) and Advanced Practice Providers (APPs -  Physician Assistants and Nurse Practitioners) who all work together to provide you with the care you need, when you need it.  We recommend signing up for the patient portal called "MyChart".  Sign up information is provided on this After Visit Summary.  MyChart is used to connect with patients for Virtual Visits (Telemedicine).  Patients are able to view lab/test results, encounter notes, upcoming appointments, etc.  Non-urgent messages can be sent to your provider as well.   To learn more about what you can do with MyChart, go to [email or web address].    Your next appointment:   6 month(s)  Provider:   Chilton Si, MD or APP

## 2022-06-06 DIAGNOSIS — Z79899 Other long term (current) drug therapy: Secondary | ICD-10-CM | POA: Diagnosis not present

## 2022-06-06 DIAGNOSIS — D6859 Other primary thrombophilia: Secondary | ICD-10-CM | POA: Diagnosis not present

## 2022-06-07 LAB — CBC
Hematocrit: 40.9 % (ref 37.5–51.0)
Hemoglobin: 14.3 g/dL (ref 13.0–17.7)
MCH: 34.5 pg — ABNORMAL HIGH (ref 26.6–33.0)
MCHC: 35 g/dL (ref 31.5–35.7)
MCV: 99 fL — ABNORMAL HIGH (ref 79–97)
Platelets: 178 10*3/uL (ref 150–450)
RBC: 4.15 x10E6/uL (ref 4.14–5.80)
RDW: 11.1 % — ABNORMAL LOW (ref 11.6–15.4)
WBC: 4 10*3/uL (ref 3.4–10.8)

## 2022-06-07 LAB — DIGOXIN LEVEL: Digoxin, Serum: 0.4 ng/mL — ABNORMAL LOW (ref 0.5–0.9)

## 2022-07-25 ENCOUNTER — Telehealth: Payer: Self-pay | Admitting: *Deleted

## 2022-07-25 NOTE — Telephone Encounter (Signed)
CALLED PATIENT TO INFORM OF CT FOR 11-27-22- ARRIVAL TIME- 11:45 AM @ [facility], PATIENT TO HAVE LABS DRAWN I-STAT IN RADIOLOGY FOR CT, PATIENT TO RECEIVE RESULTS FROM [facility] PERKINS ON 12-05-22 @ 8:30 AM VIA TELEPHONE, LVM FOR A RETURN CALL

## 2022-09-08 DIAGNOSIS — I10 Essential (primary) hypertension: Secondary | ICD-10-CM | POA: Diagnosis not present

## 2022-09-08 DIAGNOSIS — T466X5A Adverse effect of antihyperlipidemic and antiarteriosclerotic drugs, initial encounter: Secondary | ICD-10-CM | POA: Diagnosis not present

## 2022-09-08 DIAGNOSIS — Z7901 Long term (current) use of anticoagulants: Secondary | ICD-10-CM | POA: Diagnosis not present

## 2022-09-08 DIAGNOSIS — M545 Low back pain, unspecified: Secondary | ICD-10-CM | POA: Diagnosis not present

## 2022-09-08 DIAGNOSIS — I700 Atherosclerosis of aorta: Secondary | ICD-10-CM | POA: Diagnosis not present

## 2022-09-08 DIAGNOSIS — C92Z1 Other myeloid leukemia, in remission: Secondary | ICD-10-CM | POA: Diagnosis not present

## 2022-09-08 DIAGNOSIS — I480 Paroxysmal atrial fibrillation: Secondary | ICD-10-CM | POA: Diagnosis not present

## 2022-09-08 DIAGNOSIS — J449 Chronic obstructive pulmonary disease, unspecified: Secondary | ICD-10-CM | POA: Diagnosis not present

## 2022-09-08 DIAGNOSIS — K439 Ventral hernia without obstruction or gangrene: Secondary | ICD-10-CM | POA: Diagnosis not present

## 2022-09-08 DIAGNOSIS — C3431 Malignant neoplasm of lower lobe, right bronchus or lung: Secondary | ICD-10-CM | POA: Diagnosis not present

## 2022-09-08 DIAGNOSIS — M791 Myalgia, unspecified site: Secondary | ICD-10-CM | POA: Diagnosis not present

## 2022-09-08 DIAGNOSIS — F0390 Unspecified dementia without behavioral disturbance: Secondary | ICD-10-CM | POA: Diagnosis not present

## 2022-10-23 ENCOUNTER — Other Ambulatory Visit: Payer: Self-pay | Admitting: Neurology

## 2022-10-26 ENCOUNTER — Ambulatory Visit: Payer: Medicare Other | Admitting: Neurology

## 2022-11-13 ENCOUNTER — Ambulatory Visit (HOSPITAL_COMMUNITY)
Admission: RE | Admit: 2022-11-13 | Discharge: 2022-11-13 | Disposition: A | Discharge status: Home / Self Care | Payer: Medicare Other | Source: Ambulatory Visit | Attending: Radiation Oncology | Admitting: Radiation Oncology

## 2022-11-13 DIAGNOSIS — C3431 Malignant neoplasm of lower lobe, right bronchus or lung: Secondary | ICD-10-CM | POA: Insufficient documentation

## 2022-11-13 DIAGNOSIS — J439 Emphysema, unspecified: Secondary | ICD-10-CM | POA: Diagnosis not present

## 2022-11-13 DIAGNOSIS — C3490 Malignant neoplasm of unspecified part of unspecified bronchus or lung: Secondary | ICD-10-CM | POA: Diagnosis not present

## 2022-11-13 LAB — POCT I-STAT CREATININE: Creatinine, Ser: 1 mg/dL (ref 0.61–1.24)

## 2022-11-13 MED ORDER — IOHEXOL 300 MG/ML  SOLN
75.0000 mL | Freq: Once | INTRAMUSCULAR | Status: AC | PRN
  Administered 2022-11-13: 75 mL via INTRAVENOUS

## 2022-11-16 ENCOUNTER — Other Ambulatory Visit: Payer: Self-pay | Admitting: Radiation Oncology

## 2022-11-16 ENCOUNTER — Telehealth: Payer: Self-pay | Admitting: Radiation Oncology

## 2022-11-16 DIAGNOSIS — C3431 Malignant neoplasm of lower lobe, right bronchus or lung: Secondary | ICD-10-CM
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
## 2022-11-16 NOTE — Telephone Encounter (Signed)
LM for pt's daughter to share reassuring results from pt's scan. We can still talk on Monday for his formal appt but wanted to communicate good news since he does have anxiety about CT scan results.

## 2022-11-20 ENCOUNTER — Ambulatory Visit: Payer: Medicare Other | Admitting: Radiation Oncology

## 2022-11-27 ENCOUNTER — Ambulatory Visit (HOSPITAL_COMMUNITY): Payer: Medicare Other

## 2022-12-05 ENCOUNTER — Ambulatory Visit: Payer: Self-pay | Admitting: Radiation Oncology

## 2022-12-19 ENCOUNTER — Telehealth: Payer: Self-pay | Admitting: *Deleted

## 2022-12-19 NOTE — Telephone Encounter (Signed)
CALLED PATIENT TO INFORM OF CT FOR 05-18-23 - ARRIVAL TIME- 10:45 AM, NO RESTRICTIONS TO TEST @ [facility], PATIENT TO RECEIVE RESULTS FROM AL[facility] PERKINS ON 05-21-23 @ 1:30 PM, LVM FOR A RETURN CALL

## 2022-12-20 ENCOUNTER — Telehealth: Payer: Self-pay | Admitting: *Deleted

## 2022-12-20 NOTE — Telephone Encounter (Signed)
RETURNED PATIENT'S DAUGHTER'S PHONE CALL, SPOKE WITH PATIENT'S DAUGHTER- Todd Lloyd

## 2023-01-05 ENCOUNTER — Ambulatory Visit: Payer: Medicare Other | Admitting: Emergency Medicine

## 2023-01-05 ENCOUNTER — Encounter: Payer: Self-pay | Admitting: Emergency Medicine

## 2023-01-05 VITALS — BP 116/60 | HR 52 | Ht 72.0 in | Wt 213.6 lb

## 2023-01-05 DIAGNOSIS — J9611 Chronic respiratory failure with hypoxia: Secondary | ICD-10-CM | POA: Diagnosis not present

## 2023-01-05 DIAGNOSIS — C3431 Malignant neoplasm of lower lobe, right bronchus or lung: Secondary | ICD-10-CM

## 2023-01-05 DIAGNOSIS — J449 Chronic obstructive pulmonary disease, unspecified: Secondary | ICD-10-CM

## 2023-01-05 DIAGNOSIS — J9612 Chronic respiratory failure with hypercapnia: Secondary | ICD-10-CM | POA: Diagnosis not present

## 2023-01-05 NOTE — Assessment & Plan Note (Signed)
Continue your Stiolto and Asmanex as you have been using them Continue to use your albuterol as needed for shortness of breath Get your flu shot in October as planned You could consider getting the RSV vaccine as well Follow-up with APP in 6 months Follow Dr. Delton Coombes in 1 year, sooner if you have any problems.

## 2023-01-05 NOTE — Assessment & Plan Note (Signed)
Continue your oxygen at 2-3 L/min.  Try to use it reliably Continue BiPAP at night while sleeping.  We will work on confirming that we get your supplies from [employer]

## 2023-01-05 NOTE — Addendum Note (Signed)
Addended by: Hedda Slade on: 01/05/2023 04:27 PM   Modules accepted: Orders

## 2023-01-05 NOTE — Progress Notes (Signed)
Subjective:    Patient ID: Todd Lloyd, male    DOB: [birth year], 82 y.o.   MRN: [id number]  Shortness of Breath Associated symptoms include chest pain. Pertinent negatives include no congestion, coughing, fever, headaches, joint swelling, nausea, rash, sore throat or vomiting.   ROV 10/05/2021 --82 year old gentleman with a history of severe COPD and associated chronic respiratory failure requiring supplemental oxygen 3 L/min and also nocturnal BiPAP.  He has a history of SBRT for a hypermetabolic 1.7 cm right lower lobe pulmonary nodule.  Currently managed on Stiolto, Asmanex.  He notes that has some non-compliance with exertional O2. Family has noted that he will fall asleep on RA when at rest. He has exertional SOB even w the O2 on. Had more trouble during the pollen season. He has dry cough, some UA irritation. On protonix.   CT chest 07/08/2021 reviewed by me shows stable radiation changes surrounding a 1.5 cm right lower lobe nodule.  No evidence of recurrence.  ROV 01/05/2023 --follow-up visit for 82 year old gentleman with severe COPD and associated chronic respiratory failure on BiPAP and oxygen 3 L/min.  He also had empiric SBRT to a hypermetabolic 1.7 cm right lower lobe pulmonary nodule.  Last seen in our office November 2023.  He has been managed on Stiolto and Asmanex, uses albuterol approximately 3x a day.  He has exertional SOB with walking a distance, sometimes in the house. The humidity bothers him a lot. He wears O2 but not always reliably. Cough is better, minimally productive. No flares. Flu shot in October.   CT chest 11/13/2022 reviewed by me, shows centrilobular and paraseptal emphysema, anterior right lower lobe distortion from prior treatment, unchanged compared with 04/2022, some scattered pulmonary nodules, 3 mm or lower that are unchanged.  No evidence of recurrence.    Review of Systems  Constitutional:  Negative for fever and unexpected weight change.  HENT:   Negative for congestion, dental problem, ear pain, nosebleeds, postnasal drip, rhinorrhea, sinus pressure, sneezing, sore throat and trouble swallowing.   Eyes:  Negative for redness and itching.  Respiratory:  Positive for shortness of breath. Negative for cough, chest tightness and wheezing.   Cardiovascular:  Positive for chest pain. Negative for palpitations and leg swelling.  Gastrointestinal:  Negative for nausea and vomiting.  Genitourinary:  Negative for dysuria.  Musculoskeletal:  Negative for joint swelling.  Skin:  Negative for rash.  Neurological:  Negative for headaches.  Hematological:  Does not bruise/bleed easily.  Psychiatric/Behavioral:  Negative for dysphoric mood. The patient is not nervous/anxious.        Objective:   Physical Exam Vitals:   01/05/23 0948  BP: 116/60  Pulse: (!) 52  SpO2: 94%  Weight: 213 lb 9.6 oz (96.9 kg)  Height: 6' (1.829 m)   Gen: Pleasant, elderly man, in no distress,  normal affect, using a walker  ENT: No lesions,  mouth clear,  oropharynx clear, no postnasal drip  Neck: No JVD, no stridor  Lungs: No use of accessory muscles, distant, no wheeze on a normal breath.  Wheeze B on a forced exp.   Cardiovascular: RRR, heart sounds normal, no murmur or gallops, no peripheral edema  Musculoskeletal: No deformities, no cyanosis or clubbing  Neuro: alert, awake, interacting appropriately, well oriented  Skin: Warm, no lesions or rashes      Assessment & Plan:  Malignant neoplasm of lower lobe of right lung (HCC) We reviewed his CT scan of the chest today.  Get your follow-up  as planned by radiation oncology  COPD (chronic obstructive pulmonary disease) (HCC) Continue your Stiolto and Asmanex as you have been using them Continue to use your albuterol as needed for shortness of breath Get your flu shot in October as planned You could consider getting the RSV vaccine as well Follow-up with APP in 6 months Follow Dr. Delton Coombes in 1  year, sooner if you have any problems.  Chronic respiratory failure with hypoxia and hypercapnia (HCC) Continue your oxygen at 2-3 L/min.  Try to use it reliably Continue BiPAP at night while sleeping.  We will work on confirming that we get your supplies from Faye Ramsay, MD, PhD 01/05/2023, 4:20 PM [facility] [phone] or if no answer [phone]

## 2023-01-05 NOTE — Patient Instructions (Signed)
We reviewed his CT scan of the chest today.  Get your follow-up as planned by radiation oncology Continue your oxygen at 2-3 L/min.  Try to use it reliably Continue your Stiolto and Asmanex as you have been using them Continue to use your albuterol as needed for shortness of breath Continue BiPAP at night while sleeping.  We will work on confirming that we get your supplies from [employer] flu shot in October as planned You could consider getting the RSV vaccine as well Follow-up with APP in 6 months Follow Dr. Delton Coombes in 1 year, sooner if you have any problems.

## 2023-01-05 NOTE — Assessment & Plan Note (Signed)
We reviewed his CT scan of the chest today.  Get your follow-up as planned by radiation oncology

## 2023-01-08 ENCOUNTER — Telehealth: Payer: Self-pay | Admitting: Nurse Practitioner

## 2023-01-08 NOTE — Telephone Encounter (Signed)
Pt daughter called in bc [facility] wont get the Pt new supplies. Told Pt that they have faxed [place] something that needs a signature

## 2023-01-08 NOTE — Telephone Encounter (Signed)
Contacted Pt.. [facility] just received the order on Friday and confirmed receipt this morning 8/19. I have contacted Pt to advised. Daughter is ok and will wait for [facility] to send supplies

## 2023-01-11 ENCOUNTER — Telehealth: Payer: Self-pay | Admitting: Nurse Practitioner

## 2023-01-11 NOTE — Telephone Encounter (Signed)
Pt daughter calling to get her dad's order refaxed to [facility]

## 2023-01-19 NOTE — Telephone Encounter (Signed)
This was sent to [employer] can we fix it and send to [facility]

## 2023-03-02 NOTE — Telephone Encounter (Signed)
Order was resent to [facility]

## 2023-03-07 DIAGNOSIS — I10 Essential (primary) hypertension: Secondary | ICD-10-CM | POA: Diagnosis not present

## 2023-03-07 DIAGNOSIS — E781 Pure hyperglyceridemia: Secondary | ICD-10-CM | POA: Diagnosis not present

## 2023-03-07 DIAGNOSIS — E538 Deficiency of other specified B group vitamins: Secondary | ICD-10-CM | POA: Diagnosis not present

## 2023-03-07 DIAGNOSIS — Z1389 Encounter for screening for other disorder: Secondary | ICD-10-CM | POA: Diagnosis not present

## 2023-03-14 DIAGNOSIS — I700 Atherosclerosis of aorta: Secondary | ICD-10-CM | POA: Diagnosis not present

## 2023-03-14 DIAGNOSIS — C92Z1 Other myeloid leukemia, in remission: Secondary | ICD-10-CM | POA: Diagnosis not present

## 2023-03-14 DIAGNOSIS — C3431 Malignant neoplasm of lower lobe, right bronchus or lung: Secondary | ICD-10-CM | POA: Diagnosis not present

## 2023-03-14 DIAGNOSIS — Z23 Encounter for immunization: Secondary | ICD-10-CM | POA: Diagnosis not present

## 2023-03-14 DIAGNOSIS — I480 Paroxysmal atrial fibrillation: Secondary | ICD-10-CM | POA: Diagnosis not present

## 2023-03-14 DIAGNOSIS — F0390 Unspecified dementia without behavioral disturbance: Secondary | ICD-10-CM | POA: Diagnosis not present

## 2023-03-14 DIAGNOSIS — M791 Myalgia, unspecified site: Secondary | ICD-10-CM | POA: Diagnosis not present

## 2023-03-14 DIAGNOSIS — Z1339 Encounter for screening examination for other mental health and behavioral disorders: Secondary | ICD-10-CM | POA: Diagnosis not present

## 2023-03-14 DIAGNOSIS — I10 Essential (primary) hypertension: Secondary | ICD-10-CM | POA: Diagnosis not present

## 2023-03-14 DIAGNOSIS — R82998 Other abnormal findings in urine: Secondary | ICD-10-CM | POA: Diagnosis not present

## 2023-03-14 DIAGNOSIS — T466X5A Adverse effect of antihyperlipidemic and antiarteriosclerotic drugs, initial encounter: Secondary | ICD-10-CM | POA: Diagnosis not present

## 2023-03-14 DIAGNOSIS — Z7901 Long term (current) use of anticoagulants: Secondary | ICD-10-CM | POA: Diagnosis not present

## 2023-03-14 DIAGNOSIS — Z1331 Encounter for screening for depression: Secondary | ICD-10-CM | POA: Diagnosis not present

## 2023-03-14 DIAGNOSIS — Z0000 Encounter for general adult medical examination without abnormal findings: Secondary | ICD-10-CM | POA: Diagnosis not present

## 2023-03-14 DIAGNOSIS — M5417 Radiculopathy, lumbosacral region: Secondary | ICD-10-CM | POA: Diagnosis not present

## 2023-03-14 DIAGNOSIS — J449 Chronic obstructive pulmonary disease, unspecified: Secondary | ICD-10-CM | POA: Diagnosis not present

## 2023-05-18 ENCOUNTER — Ambulatory Visit (HOSPITAL_COMMUNITY)
Admission: RE | Admit: 2023-05-18 | Discharge: 2023-05-18 | Disposition: A | Discharge status: Home / Self Care | Payer: Medicare Other | Source: Ambulatory Visit | Attending: Radiation Oncology | Admitting: Radiation Oncology

## 2023-05-18 DIAGNOSIS — C3431 Malignant neoplasm of lower lobe, right bronchus or lung: Secondary | ICD-10-CM | POA: Diagnosis not present

## 2023-05-18 DIAGNOSIS — I700 Atherosclerosis of aorta: Secondary | ICD-10-CM | POA: Diagnosis not present

## 2023-05-18 DIAGNOSIS — S2241XA Multiple fractures of ribs, right side, initial encounter for closed fracture: Secondary | ICD-10-CM | POA: Diagnosis not present

## 2023-05-18 DIAGNOSIS — J439 Emphysema, unspecified: Secondary | ICD-10-CM | POA: Diagnosis not present

## 2023-05-18 LAB — POCT I-STAT CREATININE: Creatinine, Ser: 0.9 mg/dL (ref 0.61–1.24)

## 2023-05-18 MED ORDER — IOHEXOL 300 MG/ML  SOLN
75.0000 mL | Freq: Once | INTRAMUSCULAR | Status: AC | PRN
  Administered 2023-05-18: 75 mL via INTRAVENOUS

## 2023-05-21 ENCOUNTER — Ambulatory Visit: Payer: Medicare Other | Admitting: Radiation Oncology

## 2023-05-28 ENCOUNTER — Inpatient Hospital Stay
Admission: RE | Admit: 2023-05-28 | Discharge: 2023-05-28 | Disposition: A | Discharge status: Home / Self Care | Payer: Medicare Other | Source: Ambulatory Visit | Attending: Radiation Oncology | Admitting: Radiation Oncology

## 2023-05-28 ENCOUNTER — Encounter: Payer: Self-pay | Admitting: Radiation Oncology

## 2023-05-28 DIAGNOSIS — C44311 Basal cell carcinoma of skin of nose: Secondary | ICD-10-CM | POA: Diagnosis not present

## 2023-05-28 DIAGNOSIS — Z87891 Personal history of nicotine dependence: Secondary | ICD-10-CM | POA: Diagnosis not present

## 2023-05-28 DIAGNOSIS — C3431 Malignant neoplasm of lower lobe, right bronchus or lung: Secondary | ICD-10-CM
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
## 2023-05-28 NOTE — Progress Notes (Signed)
 Telephone nursing appointment for review of most recent CT-scan results. I verified daughter's identity x2, Ms. Todd Lloyd and began nursing interview.   Patient reports RT 6th/ 7th rib pain 3/10. Patient denies any other related issues at this time.   Meaningful use complete.   Patient aware of their 3:30pm-05/28/2023 telephone appointment w/ Donald Husband PA-C. I left my extension [phone] in case patient needs anything. Patient verbalized understanding. This concludes the nursing interview.   Patient contact [phone]- Daughter Todd Rosaline Minerva, LPN

## 2023-06-07 ENCOUNTER — Telehealth: Payer: Self-pay | Admitting: *Deleted

## 2023-06-07 NOTE — Telephone Encounter (Signed)
CALLED PATIENT TO INFORM OF CT FOR 07-26-23- ARRIVAL TIME- 8:15 AM @ [facility], NO RESTRICTIONS TO SCAN, PATIENT TO RECEIVE RESULTS FROM ALISON PERKINS ON 07-30-23 @ 3:30 PM VIA TELEPHONE, SPOKE WITH PATIENT'S DAUGHTER- [name] AND SHE IS AWARE OF THESE APPTS. AND THE INSTRUCTIONS

## 2023-07-09 ENCOUNTER — Ambulatory Visit: Payer: Medicare Other | Admitting: Nurse Practitioner

## 2023-07-26 ENCOUNTER — Ambulatory Visit (HOSPITAL_COMMUNITY)
Admission: RE | Admit: 2023-07-26 | Discharge: 2023-07-26 | Disposition: A | Discharge status: Home / Self Care | Payer: Medicare Other | Source: Ambulatory Visit | Attending: Radiation Oncology | Admitting: Radiation Oncology

## 2023-07-26 DIAGNOSIS — C3431 Malignant neoplasm of lower lobe, right bronchus or lung: Secondary | ICD-10-CM | POA: Insufficient documentation

## 2023-07-26 DIAGNOSIS — C3490 Malignant neoplasm of unspecified part of unspecified bronchus or lung: Secondary | ICD-10-CM | POA: Diagnosis not present

## 2023-07-26 DIAGNOSIS — J432 Centrilobular emphysema: Secondary | ICD-10-CM | POA: Diagnosis not present

## 2023-07-26 DIAGNOSIS — I700 Atherosclerosis of aorta: Secondary | ICD-10-CM | POA: Diagnosis not present

## 2023-07-26 LAB — POCT I-STAT CREATININE: Creatinine, Ser: 0.9 mg/dL (ref 0.61–1.24)

## 2023-07-26 MED ORDER — IOHEXOL 300 MG/ML  SOLN
75.0000 mL | Freq: Once | INTRAMUSCULAR | Status: AC | PRN
  Administered 2023-07-26: 75 mL via INTRAVENOUS

## 2023-07-30 ENCOUNTER — Encounter: Payer: Self-pay | Admitting: Radiation Oncology

## 2023-07-30 ENCOUNTER — Ambulatory Visit
Admission: RE | Admit: 2023-07-30 | Discharge: 2023-07-30 | Disposition: A | Discharge status: Home / Self Care | Payer: Medicare Other | Source: Ambulatory Visit | Attending: Radiation Oncology | Admitting: Radiation Oncology

## 2023-07-30 DIAGNOSIS — Z87891 Personal history of nicotine dependence: Secondary | ICD-10-CM | POA: Diagnosis not present

## 2023-07-30 DIAGNOSIS — C3431 Malignant neoplasm of lower lobe, right bronchus or lung: Secondary | ICD-10-CM

## 2023-07-30 DIAGNOSIS — C44311 Basal cell carcinoma of skin of nose: Secondary | ICD-10-CM | POA: Diagnosis not present
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
## 2023-07-30 NOTE — Progress Notes (Signed)
 Telephone nursing appointment for review of most recent CT scan. I verified patient's identity x2 and began nursing interview.   Patient's daughter Ms. Todd Lloyd reports no improvement in patient's health but is doing well overall. Patient denies any related issues at this time.   Meaningful use complete.   Patient aware of their 3:30pm-07/30/23 telephone appointment w/ Todd Aly PA-C. I left my extension [phone] in case patient needs anything. Patient verbalized understanding. This concludes the nursing interview.   Patient contact [address]     Todd Favors, LPN

## 2023-07-30 NOTE — Addendum Note (Signed)
 Encounter addended by: Ronny Bacon, PA-C on: 07/30/2023 5:08 PM  Actions taken: Follow-up modified

## 2023-08-09 ENCOUNTER — Encounter (HOSPITAL_BASED_OUTPATIENT_CLINIC_OR_DEPARTMENT_OTHER): Payer: Self-pay | Admitting: Cardiovascular Disease

## 2023-08-09 ENCOUNTER — Ambulatory Visit (INDEPENDENT_AMBULATORY_CARE_PROVIDER_SITE_OTHER): Payer: Medicare Other | Admitting: Cardiovascular Disease

## 2023-08-09 VITALS — BP 118/54 | HR 55

## 2023-08-09 DIAGNOSIS — Z5181 Encounter for therapeutic drug level monitoring: Secondary | ICD-10-CM | POA: Diagnosis not present

## 2023-08-09 DIAGNOSIS — J449 Chronic obstructive pulmonary disease, unspecified: Secondary | ICD-10-CM | POA: Diagnosis not present

## 2023-08-09 DIAGNOSIS — I10 Essential (primary) hypertension: Secondary | ICD-10-CM | POA: Diagnosis not present

## 2023-08-09 DIAGNOSIS — I482 Chronic atrial fibrillation, unspecified: Secondary | ICD-10-CM | POA: Diagnosis not present

## 2023-08-09 DIAGNOSIS — R011 Cardiac murmur, unspecified: Secondary | ICD-10-CM

## 2023-08-09 MED ORDER — NEXLIZET 180-10 MG PO TABS: 1.0000 | ORAL_TABLET | Freq: Every day | ORAL | Status: DC

## 2023-08-09 NOTE — Patient Instructions (Addendum)
 Medication Instructions:  START NEXLIZET DAILY  IF YOU TOLERATE IT LET us KNOW AND WILL SEND A PRESCRIPTION TO YOUR PHARMACY   *If you need a refill on your cardiac medications before your next appointment, please call your pharmacy*  Lab Work: DIGOXIN LEVEL WHEN YOU GO TO YOUR PRIMARY CARE DO NOT TAKE YOUR DIGOXIN MORNING OF LABS   If you have labs (blood work) drawn today and your tests are completely normal, you will receive your results only by: MyChart Message (if you have MyChart) OR A paper copy in the mail If you have any lab test that is abnormal or we need to change your treatment, we will call you to review the results.  Testing/Procedures: NONE  Your physician has requested that you have an echocardiogram. Echocardiography is a painless test that uses sound waves to create images of your heart. It provides your doctor with information about the size and shape of your heart and how well your heart's chambers and valves are working. This procedure takes approximately one hour. There are no restrictions for this procedure. Please do NOT wear cologne, perfume, aftershave, or lotions (deodorant is allowed). Please arrive 15 minutes prior to your appointment time.  Please note: We ask at that you not bring children with you during ultrasound (echo/ vascular) testing. Due to room size and safety concerns, children are not allowed in the ultrasound rooms during exams. Our front office staff cannot provide observation of children in our lobby area while testing is being conducted. An adult accompanying a patient to their appointment will only be allowed in the ultrasound room at the discretion of the ultrasound technician under special circumstances. We apologize for any inconvenience.  Follow-Up: At [facility], you and your health needs are our priority.  As part of our continuing mission to provide you with exceptional heart care, we have created designated Provider Care  Teams.  These Care Teams include your primary Cardiologist (physician) and Advanced Practice Providers (APPs -  Physician Assistants and Nurse Practitioners) who all work together to provide you with the care you need, when you need it.  We recommend signing up for the patient portal called "MyChart".  Sign up information is provided on this After Visit Summary.  MyChart is used to connect with patients for Virtual Visits (Telemedicine).  Patients are able to view lab/test results, encounter notes, upcoming appointments, etc.  Non-urgent messages can be sent to your provider as well.   To learn more about what you can do with MyChart, go to [email or web address].    Your next appointment:   6 month(s)  Provider:   Gillian Shields, NP    1 YEAR WITH DR [facility]

## 2023-08-09 NOTE — Progress Notes (Signed)
 Cardiology Office Note:  .   Date:  08/09/2023  ID:  Todd Lloyd, DOB Jul 17, 1940, MRN [id number] PCP: Gaspar Garbe, MD  [facility] Providers Cardiologist:  Chilton Si, MD    History of Present Illness: Todd Lloyd is a 83 y.o. male with paroxysmal atrial fibrillation, incidentally noted CAD, aortic atherosclerosis, mild ascending aortic aneurysm, hypertension, hyperlipidemia, severe COPD, leukemia status post BMT (1990), and prior tobacco abuse here for follow up.  He was previously a patient of Dr. Donnie Lloyd.  He has had chronic atrial fibrillation for years and rates have been well-controlled.    [name] had an echo 10/2013 that revealed LVEF 60 to 65%.  His ascending aorta was noted to be 4.1 cm at that time.  Screening chest CT 04/2016 noted atherosclerosis of the aorta and two-vessel coronary artery disease.  He had a repeat echocardiogram 10/2018 that revealed LVEF 60 to 65%.  The ascending aorta was 3.8 cm.     He was started on losartan for poorly controlled blood pressure. His blood pressure improved at follow-up. He had a pulmonary nodule noted in 2021. It was hypermetabolic on PET. He underwent SBRT and was hospitalized for hypoxic respiratory failure and pneumonia 08/2020. He follows up regularly with pulmonology and his most recent chest CT 12/2020 was stable.  At his visit 01/2021 he was doing well.  Using BiPAP at night and O2 during the day.  He saw Wallis Bamberg, NP 05/2022 and was started on pravastatin.  He did not tolerate this.  He has tried multiple statins in the past and has not tolerated them.   Today, he is accompanied by his wife who is also a patient of mine.   Discussed the use of AI scribe software for clinical note transcription with the patient, who gave verbal consent to proceed.  History of Present Illness   [name] notes that he experiences fatigue, particularly during activity, which he attributes to breathing  difficulties. His daughter ntoes that he has not consistently used his oxygen as prescribed when active at home, although he has been more diligent recently. His oxygen saturation is typically around 94-95%. He uses a BiPAP machine at night for approximately nine hours, which he adheres to regularly. He is on mometasone and tiotropium, and uses a rescue inhaler as needed. No dizziness or lightheadedness, but he reports fatigue.  He has a history of fluid retention, which was exacerbated when he discontinued Lasix, leading to significant weight gain and increased breathing difficulty about six to seven months ago. He has since resumed the medication and has not experienced swelling recently.  He has two broken ribs, specifically the sixth and seventh, which occurred post-radiation therapy. The cause of the fractures is unclear, but they were not due to a fall. The pain has improved over the past four to five months but remains tender, especially with certain movements or sneezing.  He has an abdominal hernia that is not painful unless it enlarges. He manages it with a binder when it becomes bothersome, which helps control the protrusion. There have been no instances where the hernia could not be reduced.  He experiences neuropathy and restless leg syndrome, which have been exacerbated by statin use in the past. He has tried multiple statins, including Pravachol, which caused significant muscle pain. He has not been on Zetia or ezetimibe.  He had a chest CT approximately two weeks ago, which showed stable findings. He is active around  the house, engaging in walking and other activities, but reports increased fatigue due to breathing issues. He monitors his oxygen levels regularly.     ROS:  As per HPI  Studies Reviewed: .       Echo 07/2020: IMPRESSIONS   1. Left ventricular ejection fraction, by estimation, is 55 to 60%. The  left ventricle has normal function. Left ventricular diastolic function   could not be evaluated.   2. The mitral valve is grossly normal. No evidence of mitral stenosis.   3. Tricuspid valve regurgitation is mild to moderate.   4. The aortic valve is grossly normal. Aortic valve regurgitation is  trivial. No aortic stenosis is present.   5. There is moderately elevated pulmonary artery systolic pressure.   Risk Assessment/Calculations:    CHA2DS2-VASc Score =     This indicates a  % annual risk of stroke. The patient's score is based upon:             Physical Exam:   VS:  BP (!) 118/54 (BP Location: Left Arm, Patient Position: Sitting, Cuff Size: Large)   Pulse (!) 55   SpO2 95%  , BMI There is no height or weight on file to calculate BMI. GENERAL:  Well appearing HEENT: Pupils equal round and reactive, fundi not visualized, oral mucosa unremarkable NECK:  No jugular venous distention, waveform within normal limits, carotid upstroke brisk and symmetric, no bruits, no thyromegaly LUNGS:  Clear to auscultation bilaterally HEART:  RRR.  PMI not displaced or sustained,S1 and S2 within normal limits, no S3, no S4, no clicks, no rubs, II/VI systolic murmur at LUSB ABD:  Flat, positive bowel sounds normal in frequency in pitch, no bruits, no rebound, no guarding, no midline pulsatile mass, no hepatomegaly, no splenomegaly EXT:  2 plus pulses throughout, no edema, no cyanosis no clubbing SKIN:  No rashes no nodules NEURO:  Cranial nerves II through XII grossly intact, motor grossly intact throughout PSYCH:  Cognitively intact, oriented to person place and time   ASSESSMENT AND PLAN: .    # Murmur:  Slight heart murmur suggests mild aortic stenosis. Echocardiogram needed for further evaluation. - Order echocardiogram to assess aortic valve function.   #Non-obstructive CAD:  # Hyperlipidemia:  LDL at 130 mg/dL exceeds target due to existing plaque. Statin intolerance noted. Discussed Zetia or Nexlizet as alternatives. Emphasized cholesterol reduction to  lower stroke and myocardial infarction risk. - Provide Nexlizet samples to assess tolerance. - If tolerated, prescribe Nexlizet for cholesterol management.  # Chronic Obstructive Pulmonary Disease (COPD) and Emphysema # Stage IA2, cT1bN0M0, NSCLC of the RLLL Stage four emphysema and COPD with dyspnea and fatigue. Pulmonologist recommended nocturnal BiPAP to prevent CO2 retention. Discussed ineffectiveness of advertised treatments. - Continue mometasone, tiotropium, and rescue inhaler. - Emphasize adherence to nocturnal BiPAP therapy. - Encourage consistent supplemental oxygen use during activity.  # PAF:  Rate controlled and asymptomatic.  Continue Eliquis, digoxin, and metoprolol.  - Check digoxin level during appointment with Dr. Neale Burly;   # Abdominal Hernia Abdominal hernia bothersome when enlarged. Not a surgical candidate. Manages with a binder.        Dispo: f/u in 6 months.   Signed, Chilton Si, MD

## 2023-08-13 ENCOUNTER — Encounter: Payer: Self-pay | Admitting: Nurse Practitioner

## 2023-08-13 ENCOUNTER — Ambulatory Visit (INDEPENDENT_AMBULATORY_CARE_PROVIDER_SITE_OTHER): Payer: Medicare Other | Admitting: Nurse Practitioner

## 2023-08-13 VITALS — BP 130/80 | HR 67 | Ht 72.0 in | Wt 204.4 lb

## 2023-08-13 DIAGNOSIS — J9612 Chronic respiratory failure with hypercapnia: Secondary | ICD-10-CM | POA: Diagnosis not present

## 2023-08-13 DIAGNOSIS — J449 Chronic obstructive pulmonary disease, unspecified: Secondary | ICD-10-CM | POA: Diagnosis not present

## 2023-08-13 DIAGNOSIS — C44301 Unspecified malignant neoplasm of skin of nose: Secondary | ICD-10-CM

## 2023-08-13 DIAGNOSIS — J9611 Chronic respiratory failure with hypoxia: Secondary | ICD-10-CM | POA: Diagnosis not present

## 2023-08-13 DIAGNOSIS — C3431 Malignant neoplasm of lower lobe, right bronchus or lung: Secondary | ICD-10-CM

## 2023-08-13 MED ORDER — OHTUVAYRE 3 MG/2.5ML IN SUSP: 2.5000 mL | Freq: Two times a day (BID) | RESPIRATORY_TRACT | Status: AC

## 2023-08-13 NOTE — Assessment & Plan Note (Signed)
 Stable without increased O2 requirement. Reviewed risks of untreated hypoxia and hypercarbia. Encouraged compliance with therapy. Continue BiPAP nightly for CO2 blow off and to reduce risk of hospitalization. Aware of proper care/use of device. Safe driving practices reviewed. Goal O2 >88-90%

## 2023-08-13 NOTE — Patient Instructions (Signed)
 Continue Albuterol inhaler 2 puffs every 6 hours as needed for shortness of breath or wheezing. Notify if symptoms persist despite rescue inhaler/neb use.  Continue Asmanex 2 puffs daily. Brush tongue and rinse mouth afterwards Continue Stiolto 2 puffs daily Continue supplemental oxygen 2-3 lpm for goal >88-90% Continue BiPAP nightly  Start Ohtuvayre nebulizer treatments 2.5 mL Twice daily   Repeat CT chest in 6 months with oncology  Follow up in 8 weeks with Dr. Delton Coombes or Philis Nettle to see how Todd Lloyd is working. If symptoms do not improve or worsen, please contact office for sooner follow up or seek emergency care.

## 2023-08-13 NOTE — Assessment & Plan Note (Signed)
 Very severe COPD with high symptom burden. No recent exacerbations or hospitalizations. He does have symptoms of daily productive cough. He is maintained on triple therapy with Stiolto and Asmanex. Discussed additional therapies with phosphodiesterase inhibitor. He was agreeable to trialing this if it is covered by insurance. Educated that this is a maintenance medication and not meant for acute symptoms or immediate relief. Verbalized understanding. Enrollment completed. Side effect profile reviewed. Will reassess response at follow up. Action plan in place.  Patient Instructions  Continue Albuterol inhaler 2 puffs every 6 hours as needed for shortness of breath or wheezing. Notify if symptoms persist despite rescue inhaler/neb use.  Continue Asmanex 2 puffs daily. Brush tongue and rinse mouth afterwards Continue Stiolto 2 puffs daily Continue supplemental oxygen 2-3 lpm for goal >88-90% Continue BiPAP nightly  Start Ohtuvayre nebulizer treatments 2.5 mL Twice daily   Repeat CT chest in 6 months with oncology  Follow up in 8 weeks with Dr. Delton Coombes or Philis Nettle to see how [name] working. If symptoms do not improve or worsen, please contact office for sooner follow up or seek emergency care.

## 2023-08-13 NOTE — Assessment & Plan Note (Signed)
 S/p empiric SBRT RLL hypermetabolic nodule. Recent CT chest without evidence of recurrence. Repeat CT chest in 6 months. Follow up with radiation oncology as scheduled

## 2023-08-13 NOTE — Progress Notes (Unsigned)
 @Patient  ID: Todd Lloyd, male    DOB: 15-Sep-1940, 83 y.o.   MRN: [id number]  Chief Complaint  Patient presents with   Follow-up    Breathing No improvement.    Referring provider: [name], Adelfa Koh, MD  HPI: 83 year old male, former smoker followed for COPD, chronic respiratory failure on BiPAP/oxygen. He is a patient of Dr. Kavin Leech and last seen in office 01/05/2023. He has a history of RLL lung cancer s/p empiric SBRT 2023. Past medical history significant for CAD, a fib, HTN, allergic rhinits, GERD, neuropathy, insomnia, RLS.  TEST/EVENTS:  08/25/2020 PFT: FVC 47, FEV1 27, ratio 42, DLCO 19 07/26/2023 CT chest: atherosclerosis. Cardiomegaly. Moderate centrilobular emphysema. Diffuse b/l bronchial wall thickening. Unchanged post treatment/post radiation appearance.   01/05/2023: OV with Dr. Delton Coombes. Managed on Stiolto and Asmanex. Uses albuterol 3x/day. Exertional SOB with walking any distance and sometimes in the house. Wears O2 but not reliably. Cough is better, minimally productive. Follow up 6 months.   08/13/2023: Today: follow up Discussed the use of AI scribe software for clinical note transcription with the patient, who gave verbal consent to proceed.  History of Present Illness   Todd Lloyd is a 83 year old male with chronic obstructive pulmonary disease (COPD) and lung cancer who presents with shortness of breath. He is accompanied by his caregiver who helps to provide history.   He experiences persistent shortness of breath, particularly during activities around the house. Feels relatively stable compared to his last visit; although, he is frustrated by how short of breath he gets. No wheezing or leg swelling is noted, but he does have occasional coughing with some clear phlegm production, which is baseline for him.   He does not consistently wear his prescribed oxygen. He's on 3 lpm POC and wears 2 lpm when on his home concentrator. No low O2 levels. He uses a  BiPAP machine nightly to manage CO2 levels, adhering to its use due to previous warnings about the consequences of non-compliance.  He uses Stiolto and Asmanex regularly and uses his albuterol rescue inhaler a couple of times a day. He has a history of poor compliance to inhalers according to his caregiver.   He has a history of presumed RLL lung cancer, for which he underwent empiric radiation therapy. His last CT was a few weeks ago and showed stable post radiation changes. He will have a repeat scan October 2025 with radiation oncology. He is currently using a chemo cream for a cancerous spot on his nose and followed by dermatology.       Allergies  Allergen Reactions   Lyrica [Pregabalin] Other (See Comments)    confusion   Morphine And Codeine Other (See Comments)    confusion   Pravachol [Pravastatin] Other (See Comments)    MUSCLE CRAMPS   Simvastatin Other (See Comments)    Muscle pain - reported by [facility] 2019    Immunization History  Administered Date(s) Administered   DTaP, 5 pertussis antigens 05/30/2017   Fluad Quad(high Dose 65+) 02/11/2020   Influenza Split 04/19/2009, 03/22/2011, 03/19/2012, 02/09/2014   Influenza, High Dose Seasonal PF 02/07/2013, 02/19/2013, 02/19/2014, 02/20/2015, 02/20/2016, 03/25/2016, 03/22/2018, 03/08/2022   Influenza, Quadrivalent, Recombinant, Inj, Pf 01/16/2018, 03/26/2019   Influenza,inj,Quad PF,6+ Mos 02/09/2014, 03/23/2015   Influenza-Unspecified 01/20/2014, 03/17/2017   PFIZER(Purple Top)SARS-COV-2 Vaccination 07/26/2019, 08/18/2019, 05/07/2020   Pneumococcal Conjugate-13 05/06/2013, 06/11/2014   Pneumococcal Polysaccharide-23 04/21/2013   Pneumococcal-Unspecified 04/10/2008, 04/19/2009   Tdap 05/22/2006, 05/30/2017    Past  Medical History:  Diagnosis Date   Acid reflux    Advancing dementia (HCC)    Atrial fibrillation (HCC)    CAD in native artery 02/14/2021   Chronic low back pain 07/16/2018   Colon polyps    COPD (chronic  obstructive pulmonary disease) (HCC)    Dysrhythmia    afib   Gait abnormality 09/12/2016   High cholesterol    Hypertension    Leukemia (HCC)    s/p bone marrow transplant in 1990   Memory difficulties 09/12/2016   Peripheral neuropathy 10/18/2016   RLS (restless legs syndrome) 10/18/2016    Tobacco History: Social History   Tobacco Use  Smoking Status Former   Current packs/day: 0.00   Average packs/day: 2.0 packs/day for 43.0 years (86.0 ttl pk-yrs)   Types: Cigarettes   Start date: 05/22/1957   Quit date: 05/22/2000   Years since quitting: 23.2  Smokeless Tobacco Never  Tobacco Comments   Counseled to remain smoke free   Counseling given: Not Answered Tobacco comments: Counseled to remain smoke free   Outpatient Medications Prior to Visit  Medication Sig Dispense Refill   acetaminophen (TYLENOL) 500 MG tablet Take 1,000 mg by mouth in the morning, at noon, and at bedtime.     albuterol (VENTOLIN HFA) 108 (90 Base) MCG/ACT inhaler Inhale 2 puffs into the lungs every 6 (six) hours as needed for wheezing or shortness of breath.     apixaban (ELIQUIS) 5 MG TABS tablet Take 1 tablet (5 mg total) by mouth 2 (two) times daily. 180 tablet 2   Bempedoic Acid-Ezetimibe (NEXLIZET) 180-10 MG TABS Take 1 tablet by mouth daily.     digoxin (LANOXIN) 0.25 MG tablet Take 0.25 mg by mouth every morning. For afib     donepezil (ARICEPT) 10 MG tablet Take 10 mg by mouth at bedtime. For dementia     furosemide (LASIX) 20 MG tablet Take 10 mg(1/2 tablet) Monday, Wednesday and Friday and 20 mg(1 tablet) all other days.     melatonin 3 MG TABS tablet Take 3 mg by mouth at bedtime.     memantine (NAMENDA) 5 MG tablet Take 5 mg by mouth 2 (two) times daily. For dementia     metoprolol tartrate (LOPRESSOR) 25 MG tablet Take 0.5 tablets (12.5 mg total) by mouth 2 (two) times daily.     mometasone (ASMANEX, 60 METERED DOSES,) 220 MCG/INH inhaler Inhale 2 puffs into the lungs daily. 1 each 12    Multiple Vitamins-Minerals (PRESERVISION AREDS 2 PO) Take 1 tablet by mouth in the morning and at bedtime.     OXYGEN Inhale 2 L into the lungs daily.     pantoprazole (PROTONIX) 40 MG tablet Take 1 tablet (40 mg total) by mouth daily.     pramipexole (MIRAPEX) 1 MG tablet TAKE 1 TABLET BY MOUTH TWICE A DAY 180 tablet 1   Tiotropium Bromide-Olodaterol (STIOLTO RESPIMAT) 2.5-2.5 MCG/ACT AERS Inhale 2 puffs into the lungs daily. 1 Inhaler 5   ferrous gluconate (FERGON) 324 MG tablet Take 324 mg by mouth daily with breakfast. (Patient not taking: Reported on 08/13/2023)     No facility-administered medications prior to visit.     Review of Systems:   Constitutional: No weight loss or gain, night sweats, fevers, chills, or lassitude. +chronic fatigue  HEENT: No headaches, difficulty swallowing, tooth/dental problems, or sore throat. No sneezing, itching, ear ache, nasal congestion, or post nasal drip CV:  No chest pain, orthopnea, PND, swelling in lower extremities, anasarca,  dizziness, palpitations, syncope Resp: +shortness of breath with exertion; daily productive cough. No excess mucus or change in color of mucus. No hemoptysis. No wheezing.  No chest wall deformity GI:  No heartburn, indigestion, abdominal pain, nausea, vomiting, diarrhea, change in bowel habits, loss of appetite, bloody stools.  GU: No dysuria, change in color of urine, urgency or frequency.   Skin: No rash, lesions, ulcerations MSK:  No joint pain or swelling.  Neuro: +baseline memory impairment  Psych: No depression or anxiety. Mood stable.     Physical Exam:  BP 130/80 (BP Location: Left Arm, Patient Position: Sitting)   Pulse 67   Ht 6' (1.829 m)   Wt 204 lb 6.4 oz (92.7 kg)   SpO2 95% Comment: 3L POC  BMI 27.72 kg/m   GEN: Pleasant, interactive, chronically-ill appearing; in no acute distress HEENT:  Normocephalic and atraumatic. PERRLA. Sclera white. Nasal turbinates pink, moist and patent bilaterally. No  rhinorrhea present. Oropharynx pink and moist, without exudate or edema. No lesions, ulcerations, or postnasal drip.  NECK:  Supple w/ fair ROM. Thyroid symmetrical with no goiter or nodules palpated. No lymphadenopathy.   CV: RRR, no m/r/g, no peripheral edema. Pulses intact, +2 bilaterally. No cyanosis, pallor or clubbing. PULMONARY:  Unlabored, regular breathing. Diminished bilaterally A&P w/o wheezes/rales/rhonchi. No accessory muscle use.  GI: BS present and normoactive. Soft, non-tender to palpation. No organomegaly or masses detected.  MSK: No erythema, warmth or tenderness. Cap refil <2 sec all extrem. No deformities or joint swelling noted.  Neuro: A/Ox3. No focal deficits noted.   Skin: Warm, no lesions or rashe Psych: Normal affect and behavior. Judgement and thought content appropriate.     Lab Results:  CBC    Component Value Date/Time   WBC 4.0 06/06/2022 0932   WBC 5.6 09/10/2020 1104   RBC 4.15 06/06/2022 0932   RBC 3.17 (L) 09/10/2020 1104   HGB 14.3 06/06/2022 0932   HCT 40.9 06/06/2022 0932   PLT 178 06/06/2022 0932   MCV 99 (H) 06/06/2022 0932   MCH 34.5 (H) 06/06/2022 0932   MCH 30.0 08/27/2020 0151   MCHC 35.0 06/06/2022 0932   MCHC 32.3 09/10/2020 1104   RDW 11.1 (L) 06/06/2022 0932   LYMPHSABS 1.3 09/10/2020 1104   LYMPHSABS 0.9 10/02/2018 1422   MONOABS 0.7 09/10/2020 1104   EOSABS 0.1 09/10/2020 1104   EOSABS 0.1 10/02/2018 1422   BASOSABS 0.0 09/10/2020 1104   BASOSABS 0.0 10/02/2018 1422    BMET    Component Value Date/Time   NA 143 09/10/2020 1104   NA 144 03/19/2019 1431   K 4.3 09/10/2020 1104   CL 98 09/10/2020 1104   CO2 41 (H) 09/10/2020 1104   GLUCOSE 76 09/10/2020 1104   BUN 10 09/10/2020 1104   BUN 13 03/19/2019 1431   CREATININE 0.90 07/26/2023 0847   CREATININE 1.08 07/06/2020 1329   CREATININE 1.15 02/19/2017 1631   CALCIUM 8.9 09/10/2020 1104   GFRNONAA 54 (L) 08/27/2020 0151   GFRNONAA >60 07/06/2020 1329   GFRAA >60  06/16/2019 0948    BNP    Component Value Date/Time   BNP 490.4 (H) 08/27/2020 0151     Imaging:  CT CHEST W CONTRAST Result Date: 07/30/2023 CLINICAL DATA:  Non-small cell lung cancer surveillance, status post SBRT * Tracking Code: BO * EXAM: CT CHEST WITH CONTRAST TECHNIQUE: Multidetector CT imaging of the chest was performed during intravenous contrast administration. RADIATION DOSE REDUCTION: This exam was performed according to  the departmental dose-optimization program which includes automated exposure control, adjustment of the mA and/or kV according to patient size and/or use of iterative reconstruction technique. CONTRAST:  75mL OMNIPAQUE IOHEXOL 300 MG/ML  SOLN COMPARISON:  05/18/2023 FINDINGS: Cardiovascular: Aortic atherosclerosis. 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 Cardiomegaly. Left coronary artery calcifications. No pericardial effusion. Mediastinum/Nodes: No enlarged mediastinal, hilar, or axillary lymph nodes. Thyroid gland, trachea, and esophagus demonstrate no significant findings. Lungs/Pleura: Moderate centrilobular emphysema. Diffuse bilateral bronchial wall thickening. Unchanged post treatment/post radiation appearance of spiculated right lower lobe mass, measuring 3.5 x 2.9 cm (series 6, image 90) with adjacent bandlike scarring and ground-glass. No pleural effusion or pneumothorax. Upper Abdomen: No acute abnormality. Unchanged calcified left adrenal nodule, most likely sequelae of prior infection or hemorrhage (series 2, image 164). Musculoskeletal: No chest wall abnormality. No acute osseous findings. Unchanged, mildly displaced pathologic fractures of the lateral right sixth and seventh ribs (series 6, image 75). IMPRESSION: 1. Unchanged post treatment/post radiation appearance of spiculated right lower lobe mass. 2. Unchanged, mildly displaced pathologic fractures of the adjacent lateral right sixth and seventh  ribs. 3. No evidence of lymphadenopathy or metastatic disease in the chest. 4. Emphysema and diffuse bilateral bronchial wall thickening. 5. Coronary artery disease. Aortic Atherosclerosis (ICD10-I70.0) and Emphysema (ICD10-J43.9). Electronically Signed   By: Jearld Lesch M.D.   On: 07/30/2023 08:05    Administration History     None          Latest Ref Rng & Units 08/25/2020    1:20 PM 04/29/2015   12:48 PM  PFT Results  FVC-Pre L 2.03  2.56   FVC-Predicted Pre % 47  57   FVC-Post L  2.82   FVC-Predicted Post %  63   Pre FEV1/FVC % % 42  57   Post FEV1/FCV % %  59   FEV1-Pre L 0.86  1.45   FEV1-Predicted Pre % 27  44   FEV1-Post L  1.67   DLCO uncorrected ml/min/mmHg 5.01  15.67   DLCO UNC% % 19  45   DLVA Predicted % 68  73   TLC L  6.62   TLC % Predicted %  90   RV % Predicted %  127     No results found for: "NITRICOXIDE"      Assessment & Plan:   COPD (chronic obstructive pulmonary disease) (HCC) Very severe COPD with high symptom burden. No recent exacerbations or hospitalizations. He does have symptoms of daily productive cough. He is maintained on triple therapy with Stiolto and Asmanex. Discussed additional therapies with phosphodiesterase inhibitor. He was agreeable to trialing this if it is covered by insurance. Educated that this is a maintenance medication and not meant for acute symptoms or immediate relief. Verbalized understanding. Enrollment completed. Side effect profile reviewed. Will reassess response at follow up. Action plan in place.  Patient Instructions  Continue Albuterol inhaler 2 puffs every 6 hours as needed for shortness of breath or wheezing. Notify if symptoms persist despite rescue inhaler/neb use.  Continue Asmanex 2 puffs daily. Brush tongue and rinse mouth afterwards Continue Stiolto 2 puffs daily Continue supplemental oxygen 2-3 lpm for goal >88-90% Continue BiPAP nightly  Start Ohtuvayre nebulizer treatments 2.5 mL Twice daily    Repeat CT chest in 6 months with oncology  Follow up in 8 weeks with Dr. Delton Coombes or Philis Nettle to see how [name] working. If symptoms do not improve or worsen, please contact office  for sooner follow up or seek emergency care.    Chronic respiratory failure with hypoxia and hypercapnia (HCC) Stable without increased O2 requirement. Reviewed risks of untreated hypoxia and hypercarbia. Encouraged compliance with therapy. Continue BiPAP nightly for CO2 blow off and to reduce risk of hospitalization. Aware of proper care/use of device. Safe driving practices reviewed. Goal O2 >88-90%  Malignant neoplasm of lower lobe of right lung (HCC) S/p empiric SBRT RLL hypermetabolic nodule. Recent CT chest without evidence of recurrence. Repeat CT chest in 6 months. Follow up with radiation oncology as scheduled    Advised if symptoms do not improve or worsen, to please contact office for sooner follow up or seek emergency care.   I spent *** minutes of dedicated to the care of this patient on the date of this encounter to include pre-visit review of records, face-to-face time with the patient discussing conditions above, post visit ordering of testing, clinical documentation with the electronic health record, making appropriate referrals as documented, and communicating necessary findings to members of the patients care team.  Noemi Chapel, NP 08/13/2023  Pt aware and understands NP's role.

## 2023-08-14 ENCOUNTER — Encounter: Payer: Self-pay | Admitting: Nurse Practitioner

## 2023-08-14 NOTE — Assessment & Plan Note (Signed)
 Follow up with dermatology.

## 2023-08-21 DIAGNOSIS — I480 Paroxysmal atrial fibrillation: Secondary | ICD-10-CM | POA: Diagnosis not present

## 2023-08-21 DIAGNOSIS — M791 Myalgia, unspecified site: Secondary | ICD-10-CM | POA: Diagnosis not present

## 2023-08-21 DIAGNOSIS — C92Z1 Other myeloid leukemia, in remission: Secondary | ICD-10-CM | POA: Diagnosis not present

## 2023-08-21 DIAGNOSIS — I10 Essential (primary) hypertension: Secondary | ICD-10-CM | POA: Diagnosis not present

## 2023-08-21 DIAGNOSIS — I700 Atherosclerosis of aorta: Secondary | ICD-10-CM | POA: Diagnosis not present

## 2023-08-21 DIAGNOSIS — J449 Chronic obstructive pulmonary disease, unspecified: Secondary | ICD-10-CM | POA: Diagnosis not present

## 2023-08-21 DIAGNOSIS — Z79899 Other long term (current) drug therapy: Secondary | ICD-10-CM | POA: Diagnosis not present

## 2023-08-21 DIAGNOSIS — E7800 Pure hypercholesterolemia, unspecified: Secondary | ICD-10-CM | POA: Diagnosis not present

## 2023-08-21 DIAGNOSIS — D6869 Other thrombophilia: Secondary | ICD-10-CM | POA: Diagnosis not present

## 2023-08-21 DIAGNOSIS — C3431 Malignant neoplasm of lower lobe, right bronchus or lung: Secondary | ICD-10-CM | POA: Diagnosis not present

## 2023-08-21 DIAGNOSIS — F0390 Unspecified dementia without behavioral disturbance: Secondary | ICD-10-CM | POA: Diagnosis not present

## 2023-08-21 DIAGNOSIS — K439 Ventral hernia without obstruction or gangrene: Secondary | ICD-10-CM | POA: Diagnosis not present

## 2023-08-21 DIAGNOSIS — Z9981 Dependence on supplemental oxygen: Secondary | ICD-10-CM | POA: Diagnosis not present

## 2023-08-28 ENCOUNTER — Telehealth: Payer: Self-pay

## 2023-08-28 NOTE — Telephone Encounter (Signed)
 Copied from CRM [id number]. Topic: Clinical - Medical Advice >> Aug 28, 2023 10:13 AM Nila Nephew wrote: Reason for CRM: Patient's daughter calling in to state that they have heard nothing regarding nebulizer after filling out grant information at last appointment in March. Please update patient about this process when able.  Routing to pharmacy, please advise as pt has not heard any information regarding Ohtuvayre. Please advise

## 2023-08-29 NOTE — Telephone Encounter (Signed)
 Yes, it was. [facility], can you locate his paperwork we completed? Thanks.

## 2023-08-30 NOTE — Progress Notes (Unsigned)
 Patient's OHTVUVAYRA paperwork was scanned into patient's chart and placed in pharmacy box 08/30/2023.

## 2023-09-03 ENCOUNTER — Telehealth: Payer: Self-pay | Admitting: Cardiovascular Disease

## 2023-09-03 MED ORDER — NEXLIZET 180-10 MG PO TABS
1.0000 | ORAL_TABLET | Freq: Every day | ORAL | 3 refills | Status: DC
Start: 2023-09-03 — End: 2023-09-04

## 2023-09-03 NOTE — Telephone Encounter (Signed)
 Rx sent as requested.

## 2023-09-03 NOTE — Telephone Encounter (Signed)
 Pt daughter called in to confirm that pt has been taking the Nexlizet samples that we given at last appt and he hasn't had any problems with it. They would like full 90 day supply sent to [facility] pharmacy    Bempedoic Acid-Ezetimibe [facility]) 180-10 MG TABS  [place] [facility] - [place], [place] - [address] Phone: [phone]  Fax: [phone]

## 2023-09-04 ENCOUNTER — Telehealth: Payer: Self-pay | Admitting: Pharmacy Technician

## 2023-09-04 MED ORDER — NEXLIZET 180-10 MG PO TABS: 1.0000 | ORAL_TABLET | Freq: Every day | ORAL | 3 refills | Status: DC

## 2023-09-04 NOTE — Addendum Note (Signed)
 Addended by: Guss Legacy on: 09/04/2023 09:52 AM   Modules accepted: Orders

## 2023-09-04 NOTE — Telephone Encounter (Signed)
 Pharmacy Patient Advocate Encounter   Received notification from CoverMyMeds that prior authorization for nexlizet is required/requested.   Insurance verification completed.   The patient is insured through [facility] .   Per test claim: PA required; PA submitted to above mentioned insurance via CoverMyMeds Key/confirmation #/EOC [id number] Status is pending

## 2023-09-04 NOTE — Telephone Encounter (Signed)
 Pharmacy Patient Advocate Encounter  Received notification from [facility] that Prior Authorization for nexlizet has been APPROVED from 09/04/23 to 03/05/24. Spoke to pharmacy to process.Copay is $141.00 for 90 days.    PA #/Case ID/Reference #:  [id number]

## 2023-09-04 NOTE — Telephone Encounter (Signed)
 Faxed more info

## 2023-09-05 ENCOUNTER — Telehealth: Payer: Self-pay | Admitting: Pharmacist

## 2023-09-05 NOTE — Telephone Encounter (Signed)
 Received Ohtuvayre new start paperwork. Completed form and faxed with clinicals and insurance card copy to [facility]   Phone#: [phone] Fax#: [phone]

## 2023-09-06 ENCOUNTER — Other Ambulatory Visit (HOSPITAL_BASED_OUTPATIENT_CLINIC_OR_DEPARTMENT_OTHER): Payer: Self-pay

## 2023-09-06 MED ORDER — NEXLIZET 180-10 MG PO TABS: 1.0000 | ORAL_TABLET | Freq: Every day | ORAL | 3 refills | Status: AC

## 2023-09-10 NOTE — Telephone Encounter (Signed)
 Received fax from [employer] with summary of benefits. Referral form for Ohtuvayre  received. Rx will be triaged to [facility].. Once benefits investigation completed, pharmacy will reach out the patient to schedule shipment. If medication is unaffordable, patient will need to express financial hardship to be referred back to [place] [facility] for patient assistance program pre-screening.   Patient ID: [id number] Pharmacy phone: [phone] [place] [facility] Phone#: [phone]

## 2023-09-17 ENCOUNTER — Telehealth: Payer: Self-pay | Admitting: *Deleted

## 2023-09-17 NOTE — Telephone Encounter (Signed)
 CALLED PATIENT TO INFORM OF CT FOR 01/30/24- ARRIVAL TIME- 12:15 PM @ [facility], NO RESTRICTIONS TO SCAN, PATIENT TO BE CALLED ON 02-18-24 @ 2 PM WITH RESULTS BY PA ALISON PERKINS, LVM FOR A RETURN CALL

## 2023-09-24 ENCOUNTER — Telehealth: Payer: Self-pay

## 2023-09-24 NOTE — Telephone Encounter (Signed)
 Patient's  daughter called stating pt has started on the nebulizer treatment  and would need a follow-up apt on how the treatment is going. Todd Lloyd would like pt to schedule for 8-6 week. I called and scheduled pt for 12/05/2023 10:30 am.

## 2023-10-09 ENCOUNTER — Encounter (HOSPITAL_BASED_OUTPATIENT_CLINIC_OR_DEPARTMENT_OTHER)

## 2023-10-16 ENCOUNTER — Ambulatory Visit: Admitting: Nurse Practitioner

## 2023-10-23 ENCOUNTER — Telehealth: Payer: Self-pay | Admitting: Emergency Medicine

## 2023-10-23 NOTE — Telephone Encounter (Signed)
 Cmn received from [facility] for O2 and supplies.

## 2023-11-08 NOTE — Telephone Encounter (Signed)
 Cmn signed

## 2023-11-09 NOTE — Telephone Encounter (Signed)
 Rc'd signed copy of O2 order. Will fax to [phone]. Once confirmation rec'd will attach and send to scan

## 2023-11-12 ENCOUNTER — Ambulatory Visit (HOSPITAL_BASED_OUTPATIENT_CLINIC_OR_DEPARTMENT_OTHER)

## 2023-11-12 DIAGNOSIS — I482 Chronic atrial fibrillation, unspecified: Secondary | ICD-10-CM

## 2023-11-12 DIAGNOSIS — R011 Cardiac murmur, unspecified: Secondary | ICD-10-CM | POA: Diagnosis not present

## 2023-11-12 LAB — ECHOCARDIOGRAM COMPLETE
Area-P 1/2: 2.8 cm2
P 1/2 time: 479 ms
S' Lateral: 2.86 cm

## 2023-11-15 ENCOUNTER — Ambulatory Visit: Payer: Self-pay | Admitting: Cardiovascular Disease

## 2023-12-03 ENCOUNTER — Ambulatory Visit: Payer: Medicare Other | Admitting: Radiation Oncology

## 2023-12-05 ENCOUNTER — Encounter: Payer: Self-pay | Admitting: Nurse Practitioner

## 2023-12-05 ENCOUNTER — Ambulatory Visit (INDEPENDENT_AMBULATORY_CARE_PROVIDER_SITE_OTHER): Admitting: Nurse Practitioner

## 2023-12-05 VITALS — BP 144/74 | HR 64 | Ht 72.0 in | Wt 187.6 lb

## 2023-12-05 DIAGNOSIS — I482 Chronic atrial fibrillation, unspecified: Secondary | ICD-10-CM | POA: Diagnosis not present

## 2023-12-05 DIAGNOSIS — J9612 Chronic respiratory failure with hypercapnia: Secondary | ICD-10-CM

## 2023-12-05 DIAGNOSIS — Z87891 Personal history of nicotine dependence: Secondary | ICD-10-CM | POA: Diagnosis not present

## 2023-12-05 DIAGNOSIS — C3431 Malignant neoplasm of lower lobe, right bronchus or lung: Secondary | ICD-10-CM | POA: Diagnosis not present

## 2023-12-05 DIAGNOSIS — J9611 Chronic respiratory failure with hypoxia: Secondary | ICD-10-CM | POA: Diagnosis not present

## 2023-12-05 DIAGNOSIS — J449 Chronic obstructive pulmonary disease, unspecified: Secondary | ICD-10-CM

## 2023-12-05 NOTE — Assessment & Plan Note (Signed)
Rate controlled. Follow up with cardiology as scheduled.

## 2023-12-05 NOTE — Assessment & Plan Note (Signed)
 RLL lung cancer s/p empiric SBRT 2023. Stable CT chest March 2025; repeat scheduled Sept. 2025. Follow up with oncology as scheduled

## 2023-12-05 NOTE — Assessment & Plan Note (Signed)
 Very severe COPD. No recent exacerbations. Clinically improved after starting Ohtuvayre  nebulized treatments and with weight loss. Encouraged to remain active. Action plan in place. Trigger prevention reviewed. UTD on vaccines.  Patient Instructions  Continue Albuterol  inhaler 2 puffs every 6 hours as needed for shortness of breath or wheezing. Notify if symptoms persist despite rescue inhaler/neb use.  Continue Asmanex  2 puffs daily. Brush tongue and rinse mouth afterwards Continue Stiolto 2 puffs daily Continue supplemental oxygen  2-3 lpm for goal >88-90% Continue BiPAP nightly Continue Ohtuvayre  nebulizer treatments 2.5 mL Twice daily    Repeat CT chest in September with oncology   Follow up in 4 months with Dr. Shelah or Todd Lloyd. If symptoms do not improve or worsen, please contact office for sooner follow up or seek emergency care.

## 2023-12-05 NOTE — Progress Notes (Signed)
 @Patient  ID: Todd Lloyd, male    DOB: May 27, 1940, 83 y.o.   MRN: [id number]  Chief Complaint  Patient presents with   Follow-up    Patient states he's doing well on the nebulizer treatment.    Referring provider: [name], Charlie ORN, MD  HPI: 83 year old male, former smoker followed for COPD, chronic respiratory failure on BiPAP/oxygen . He is a patient of Dr. Lanny and last seen in office 08/13/2023 by [facility] NP. He has a history of RLL lung cancer s/p empiric SBRT 2023. Past medical history significant for CAD, a fib, HTN, allergic rhinits, GERD, neuropathy, insomnia, RLS.  TEST/EVENTS:  08/25/2020 PFT: FVC 47, FEV1 27, ratio 42, DLCO 19 07/26/2023 CT chest: atherosclerosis. Cardiomegaly. Moderate centrilobular emphysema. Diffuse b/l bronchial wall thickening. Unchanged post treatment/post radiation appearance.   08/13/2023: Todd Lloyd with [name] Discussed the use of AI scribe software for clinical note transcription with the patient, who gave verbal consent to proceed. Todd Lloyd is a 83 year old male with chronic obstructive pulmonary disease (COPD) and lung cancer who presents with shortness of breath. He is accompanied by his caregiver who helps to provide history.  He experiences persistent shortness of breath, particularly during activities around the house. Feels relatively stable compared to his last visit; although, he is frustrated by how short of breath he gets. No wheezing or leg swelling is noted, but he does have occasional coughing with some clear phlegm production, which is baseline for him.  He does not consistently wear his prescribed oxygen . He's on 3 lpm POC and wears 2 lpm when on his home concentrator. No low O2 levels. He uses a BiPAP machine nightly to manage CO2 levels, adhering to its use due to previous warnings about the consequences of non-compliance. He uses Stiolto and Asmanex  regularly and uses his albuterol  rescue inhaler a couple of times a day. He has a  history of poor compliance to inhalers according to his caregiver.  He has a history of presumed RLL lung cancer, for which he underwent empiric radiation therapy. His last CT was a few weeks ago and showed stable post radiation changes. He will have a repeat scan October 2025 with radiation oncology. He is currently using a chemo cream for a cancerous spot on his nose and followed by dermatology.     12/05/2023: Today - follow up Discussed the use of AI scribe software for clinical note transcription with the patient, who gave verbal consent to proceed.  History of Present Illness   Todd Lloyd is an 83 year old male with severe COPD and a history of lung cancer who presents for follow-up regarding his respiratory status.  He has severe COPD and has noticed improvement in his breathing with the new nebulizer medication, Ohtuvayre . He uses oxygen  at a rate of 3 lpm POC and two liters per minute at home. He continues to use his BiPAP machine at night. His use of the rescue inhaler has decreased significantly, from several times a day to infrequent use since starting the Ohtuvayre . No hemoptysis, increased cough, wheezing, anorexia.   He has experienced significant weight loss, dropping from 209 pounds to 187 pounds, attributed to cutting out sugary drinks and junk food. He would eat a lot of ice cream before too and has cut this out. He believes this weight loss has contributed to his improved breathing. His appetite remains good, and he confirms that his weight loss is intentional due to dietary changes.  He  has a history of lung cancer. A CT scan is scheduled for September, with the last one in March being stable with no evidence of disease recurrence.   He has a history of atrial fibrillation. He recently had an echocardiogram showing mild regurgitation.  Needs oxygen  renewal today.      Allergies  Allergen Reactions   Lyrica [Pregabalin] Other (See Comments)    confusion   Morphine  And Codeine Other (See Comments)    confusion   Pravachol  [Pravastatin ] Other (See Comments)    MUSCLE CRAMPS   Simvastatin Other (See Comments)    Muscle pain - reported by [facility] 2019    Immunization History  Administered Date(s) Administered   DTaP, 5 pertussis antigens 05/30/2017   Fluad Quad(high Dose 65+) 02/11/2020   Fluzone  Influenza virus vaccine,trivalent (IIV3), split virus 03/22/2011, 03/19/2012   Influenza Split 04/19/2009, 02/09/2014   Influenza, High Dose Seasonal PF 02/07/2013, 02/19/2013, 02/19/2014, 02/20/2015, 02/20/2016, 03/25/2016, 03/22/2018, 03/08/2022   Influenza, Quadrivalent, Recombinant, Inj, Pf 01/16/2018, 03/26/2019   Influenza,inj,Quad PF,6+ Mos 02/09/2014, 03/23/2015   Influenza-Unspecified 01/20/2014, 03/17/2017   PFIZER(Purple Top)SARS-COV-2 Vaccination 07/26/2019, 08/18/2019, 05/07/2020, 03/02/2021   Pneumococcal Conjugate-13 05/06/2013, 06/11/2014   Pneumococcal Polysaccharide-23 04/21/2013   Pneumococcal-Unspecified 04/10/2008, 04/19/2009   Tdap 05/22/2006, 05/30/2017    Past Medical History:  Diagnosis Date   Acid reflux    Advancing dementia (HCC)    Atrial fibrillation (HCC)    CAD in native artery 02/14/2021   Chronic low back pain 07/16/2018   Colon polyps    COPD (chronic obstructive pulmonary disease) (HCC)    Dysrhythmia    afib   Gait abnormality 09/12/2016   High cholesterol    Hypertension    Leukemia (HCC)    s/p bone marrow transplant in 1990   Memory difficulties 09/12/2016   Peripheral neuropathy 10/18/2016   RLS (restless legs syndrome) 10/18/2016    Tobacco History: Social History   Tobacco Use  Smoking Status Former   Current packs/day: 0.00   Average packs/day: 2.0 packs/day for 43.0 years (86.0 ttl pk-yrs)   Types: Cigarettes   Start date: 05/22/1957   Quit date: 05/22/2000   Years since quitting: 23.5  Smokeless Tobacco Never  Tobacco Comments   Counseled to remain smoke free   Counseling given: Not  Answered Tobacco comments: Counseled to remain smoke free   Outpatient Medications Prior to Visit  Medication Sig Dispense Refill   acetaminophen  (TYLENOL ) 500 MG tablet Take 1,000 mg by mouth in the morning, at noon, and at bedtime.     albuterol  (VENTOLIN  HFA) 108 (90 Base) MCG/ACT inhaler Inhale 2 puffs into the lungs every 6 (six) hours as needed for wheezing or shortness of breath.     apixaban  (ELIQUIS ) 5 MG TABS tablet Take 1 tablet (5 mg total) by mouth 2 (two) times daily. 180 tablet 2   Bempedoic Acid-Ezetimibe (NEXLIZET ) 180-10 MG TABS Take 1 tablet by mouth daily. 90 tablet 3   digoxin  (LANOXIN ) 0.25 MG tablet Take 0.25 mg by mouth every morning. For afib     donepezil  (ARICEPT ) 10 MG tablet Take 10 mg by mouth at bedtime. For dementia     Ensifentrine  (OHTUVAYRE ) 3 MG/2.5ML SUSP Inhale 2.5 mLs into the lungs 2 (two) times daily.     furosemide  (LASIX ) 20 MG tablet Take 10 mg(1/2 tablet) Monday, Wednesday and Friday and 20 mg(1 tablet) all other days.     melatonin 3 MG TABS tablet Take 3 mg by mouth at bedtime.  memantine  (NAMENDA ) 5 MG tablet Take 5 mg by mouth 2 (two) times daily. For dementia     metoprolol  tartrate (LOPRESSOR ) 25 MG tablet Take 0.5 tablets (12.5 mg total) by mouth 2 (two) times daily.     mometasone  (ASMANEX , 60 METERED DOSES,) 220 MCG/INH inhaler Inhale 2 puffs into the lungs daily. 1 each 12   Multiple Vitamins-Minerals (PRESERVISION AREDS 2 PO) Take 1 tablet by mouth in the morning and at bedtime.     OXYGEN  Inhale 2 L into the lungs daily.     pantoprazole  (PROTONIX ) 40 MG tablet Take 1 tablet (40 mg total) by mouth daily.     pramipexole  (MIRAPEX ) 1 MG tablet TAKE 1 TABLET BY MOUTH TWICE A DAY 180 tablet 1   Tiotropium Bromide -Olodaterol (STIOLTO RESPIMAT ) 2.5-2.5 MCG/ACT AERS Inhale 2 puffs into the lungs daily. 1 Inhaler 5   No facility-administered medications prior to visit.     Review of Systems:   Constitutional: No night sweats, fevers,  chills, or lassitude. +chronic fatigue, intentional weight loss  HEENT: No headaches, difficulty swallowing, tooth/dental problems, or sore throat. No sneezing, itching, ear ache, nasal congestion, or post nasal drip CV:  No chest pain, orthopnea, PND, swelling in lower extremities, anasarca, dizziness, palpitations, syncope Resp: +shortness of breath with exertion (improved); daily productive cough (minimal improvement). No excess mucus or change in color of mucus. No hemoptysis. No wheezing.  No chest wall deformity GI:  No heartburn, indigestion GU: No dysuria, change in color of urine, urgency or frequency.   MSK:  No joint pain or swelling.  Neuro: +baseline memory impairment  Psych: No depression or anxiety. Mood stable.     Physical Exam:  BP (!) 144/74 (BP Location: Left Arm, Patient Position: Sitting, Cuff Size: Large)   Pulse 64   Ht 6' (1.829 m)   Wt 187 lb 9.6 oz (85.1 kg)   SpO2 93%   BMI 25.44 kg/m   GEN: Pleasant, interactive, chronically-ill appearing; in no acute distress HEENT:  Normocephalic and atraumatic. PERRLA. Sclera white. Nasal turbinates pink, moist and patent bilaterally. No rhinorrhea present. Oropharynx pink and moist, without exudate or edema. No lesions, ulcerations, or postnasal drip.  NECK:  Supple w/ fair ROM. No lymphadenopathy.   CV: Irregular rhythm, rate controlled, no m/r/g, no peripheral edema. Pulses intact, +2 bilaterally. No cyanosis, pallor or clubbing. PULMONARY:  Unlabored, regular breathing. Diminished bilaterally A&P w/o wheezes/rales/rhonchi. No accessory muscle use.  GI: BS present and normoactive. Soft, non-tender to palpation.  MSK: No erythema, warmth or tenderness. Cap refil <2 sec all extrem.  Neuro: A/Ox3. No focal deficits noted.   Skin: Warm, intact.  Psych: Normal affect and behavior. Judgement and thought content appropriate.     Lab Results:  CBC    Component Value Date/Time   WBC 4.0 06/06/2022 0932   WBC 5.6  09/10/2020 1104   RBC 4.15 06/06/2022 0932   RBC 3.17 (L) 09/10/2020 1104   HGB 14.3 06/06/2022 0932   HCT 40.9 06/06/2022 0932   PLT 178 06/06/2022 0932   MCV 99 (H) 06/06/2022 0932   MCH 34.5 (H) 06/06/2022 0932   MCH 30.0 08/27/2020 0151   MCHC 35.0 06/06/2022 0932   MCHC 32.3 09/10/2020 1104   RDW 11.1 (L) 06/06/2022 0932   LYMPHSABS 1.3 09/10/2020 1104   LYMPHSABS 0.9 10/02/2018 1422   MONOABS 0.7 09/10/2020 1104   EOSABS 0.1 09/10/2020 1104   EOSABS 0.1 10/02/2018 1422   BASOSABS 0.0 09/10/2020 1104  BASOSABS 0.0 10/02/2018 1422    BMET    Component Value Date/Time   NA 143 09/10/2020 1104   NA 144 03/19/2019 1431   K 4.3 09/10/2020 1104   CL 98 09/10/2020 1104   CO2 41 (H) 09/10/2020 1104   GLUCOSE 76 09/10/2020 1104   BUN 10 09/10/2020 1104   BUN 13 03/19/2019 1431   CREATININE 0.90 07/26/2023 0847   CREATININE 1.08 07/06/2020 1329   CREATININE 1.15 02/19/2017 1631   CALCIUM  8.9 09/10/2020 1104   GFRNONAA 54 (L) 08/27/2020 0151   GFRNONAA >60 07/06/2020 1329   GFRAA >60 06/16/2019 0948    BNP    Component Value Date/Time   BNP 490.4 (H) 08/27/2020 0151     Imaging:  ECHOCARDIOGRAM COMPLETE Result Date: 11/12/2023    ECHOCARDIOGRAM REPORT   Patient Name:   Todd Lloyd Date of Exam: 11/12/2023 Medical Rec #:  [id number]          Height:       72.0 in Accession #:    [id number]         Weight:       204.4 lb Date of Birth:  1940-05-31          BSA:          2.150 m Patient Age:    82 years           BP:           110/60 mmHg Patient Gender: M                  HR:           58 bpm. Exam Location:  Outpatient Procedure: 2D Echo, 3D Echo, Cardiac Doppler, Color Doppler and Strain Analysis            (Both Spectral and Color Flow Doppler were utilized during            procedure). Indications:    Murmur  History:        Patient has prior history of Echocardiogram examinations, most                 recent 07/27/2020. CAD, COPD, Arrythmias:Atrial Fibrillation;  Risk                 Factors:Hypertension and Former Smoker.  Sonographer:    Orvil Holmes RDCS Referring Phys: [id number] TIFF[place] IMPRESSIONS  1. Left ventricular ejection fraction, by estimation, is 60 to 65%. The left ventricle has normal function. The left ventricle has no regional wall motion abnormalities. There is mild concentric left ventricular hypertrophy. Left ventricular diastolic function could not be evaluated. The average left ventricular global longitudinal strain is -18.1 %. The global longitudinal strain is normal.  2. Right ventricular systolic function is normal. The right ventricular size is mildly enlarged. There is mildly elevated pulmonary artery systolic pressure.  3. Left atrial size was moderately dilated.  4. Right atrial size was severely dilated.  5. The mitral valve is normal in structure. Trivial mitral valve regurgitation. No evidence of mitral stenosis.  6. The aortic valve is tricuspid. Aortic valve regurgitation is mild. No aortic stenosis is present.  7. The inferior vena cava is dilated in size with >50% respiratory variability, suggesting right atrial pressure of 8 mmHg. FINDINGS  Left Ventricle: Left ventricular ejection fraction, by estimation, is 60 to 65%. The left ventricle has normal function. The left ventricle has no regional wall motion abnormalities. The average left  ventricular global longitudinal strain is -18.1 %. Strain was performed and the global longitudinal strain is normal. The left ventricular internal cavity size was normal in size. There is mild concentric left ventricular hypertrophy. Left ventricular diastolic function could not be evaluated due to atrial fibrillation. Left ventricular diastolic function could not be evaluated. Right Ventricle: The right ventricular size is mildly enlarged. Right ventricular systolic function is normal. There is mildly elevated pulmonary artery systolic pressure. The tricuspid regurgitant velocity is 2.84 m/s,  and with an assumed right atrial pressure of 8 mmHg, the estimated right ventricular systolic pressure is 40.3 mmHg. Left Atrium: Left atrial size was moderately dilated. Right Atrium: Right atrial size was severely dilated. Pericardium: There is no evidence of pericardial effusion. Mitral Valve: The mitral valve is normal in structure. Mild mitral annular calcification. Trivial mitral valve regurgitation. No evidence of mitral valve stenosis. Tricuspid Valve: The tricuspid valve is normal in structure. Tricuspid valve regurgitation is mild . No evidence of tricuspid stenosis. Aortic Valve: The aortic valve is tricuspid. Aortic valve regurgitation is mild. Aortic regurgitation PHT measures 479 msec. No aortic stenosis is present. Pulmonic Valve: The pulmonic valve was normal in structure. Pulmonic valve regurgitation is trivial. No evidence of pulmonic stenosis. Aorta: The aortic root is normal in size and structure. Venous: The inferior vena cava is dilated in size with greater than 50% respiratory variability, suggesting right atrial pressure of 8 mmHg. IAS/Shunts: No atrial level shunt detected by color flow Doppler. Additional Comments: 3D was performed not requiring image post processing on an independent workstation and was normal.  LEFT VENTRICLE PLAX 2D LVIDd:         4.78 cm   Diastology LVIDs:         2.86 cm   LV e' medial:    9.57 cm/s LV PW:         2.49 cm   LV E/e' medial:  11.1 LV IVS:        1.18 cm   LV e' lateral:   9.46 cm/s LVOT diam:     2.20 cm   LV E/e' lateral: 11.2 LV SV:         49 LV SV Index:   23        2D Longitudinal Strain LVOT Area:     3.80 cm  2D Strain GLS Avg:     -18.1 %                           3D Volume EF:                          LV EDV:       148 ml                          LV ESV:       74 ml                          LV SV:        75 ml RIGHT VENTRICLE RV Basal diam:  5.57 cm RV Mid diam:    4.58 cm RV S prime:     10.90 cm/s TAPSE (M-mode): 2.9 cm LEFT ATRIUM            Index        RIGHT ATRIUM  Index LA diam:      3.90 cm 1.81 cm/m   RA Area:     31.00 cm LA Vol (A2C): 53.3 ml 24.79 ml/m  RA Volume:   113.00 ml 52.55 ml/m LA Vol (A4C): 74.0 ml 34.41 ml/m  AORTIC VALVE LVOT Vmax:   74.40 cm/s LVOT Vmean:  47.200 cm/s LVOT VTI:    0.130 m AI PHT:      479 msec  AORTA Ao Root diam: 3.70 cm Ao Asc diam:  3.50 cm MITRAL VALVE                TRICUSPID VALVE MV Area (PHT): 2.80 cm     TR Peak grad:   32.3 mmHg MV Decel Time: 271 msec     TR Vmax:        284.00 cm/s MV E velocity: 106.00 cm/s MV A velocity: 33.00 cm/s   SHUNTS MV E/A ratio:  3.21         Systemic VTI:  0.13 m                             Systemic Diam: 2.20 cm Redell Shallow MD Electronically signed by Redell Shallow MD Signature Date/Time: 11/12/2023/12:43:08 PM    Final     Administration History     None          Latest Ref Rng & Units 08/25/2020    1:20 PM 04/29/2015   12:48 PM  PFT Results  FVC-Pre L 2.03  2.56   FVC-Predicted Pre % 47  57   FVC-Post L  2.82   FVC-Predicted Post %  63   Pre FEV1/FVC % % 42  57   Post FEV1/FCV % %  59   FEV1-Pre L 0.86  1.45   FEV1-Predicted Pre % 27  44   FEV1-Post L  1.67   DLCO uncorrected ml/min/mmHg 5.01  15.67   DLCO UNC% % 19  45   DLVA Predicted % 68  73   TLC L  6.62   TLC % Predicted %  90   RV % Predicted %  127     No results found for: NITRICOXIDE      Assessment & Plan:   COPD (chronic obstructive pulmonary disease) (HCC) Very severe COPD. No recent exacerbations. Clinically improved after starting Ohtuvayre  nebulized treatments and with weight loss. Encouraged to remain active. Action plan in place. Trigger prevention reviewed. UTD on vaccines.  Patient Instructions  Continue Albuterol  inhaler 2 puffs every 6 hours as needed for shortness of breath or wheezing. Notify if symptoms persist despite rescue inhaler/neb use.  Continue Asmanex  2 puffs daily. Brush tongue and rinse mouth afterwards Continue Stiolto 2  puffs daily Continue supplemental oxygen  2-3 lpm for goal >88-90% Continue BiPAP nightly Continue Ohtuvayre  nebulizer treatments 2.5 mL Twice daily    Repeat CT chest in September with oncology   Follow up in 4 months with Dr. Shelah or Izetta Malachy PIETY. If symptoms do not improve or worsen, please contact office for sooner follow up or seek emergency care.    Chronic respiratory failure with hypoxia and hypercapnia (HCC) Stable without increased O2 requirement. Reviewed risks of untreated hypoxia and hypercarbia. Encouraged compliance with therapy. Continue BiPAP nightly for CO2 blow off and to reduce risk of hospitalization. Aware of proper care/use of device. Safe driving practices reviewed. Goal O2 >88-90% Requalified for O2 today. 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 Stable CT chest March 2025; repeat scheduled Sept. 2025. Follow up with oncology as scheduled   Chronic atrial fibrillation (HCC) Rate controlled. Follow up with cardiology as scheduled     Advised if symptoms do not improve or worsen, to please contact office for sooner follow up or seek emergency care.   I spent 35 minutes of dedicated to the care of this patient on the date of this encounter to include pre-visit review of records, face-to-face time with the patient discussing conditions above, post visit ordering of testing, clinical documentation with the electronic health record, making appropriate referrals as documented, and communicating necessary findings to members of the patients  care team.  Comer LULLA Rouleau, NP 12/05/2023  Pt aware and understands NP's role.

## 2023-12-05 NOTE — Assessment & Plan Note (Addendum)
 Stable without increased O2 requirement. Reviewed risks of untreated hypoxia and hypercarbia. Encouraged compliance with therapy. Continue BiPAP nightly for CO2 blow off and to reduce risk of hospitalization. Aware of proper care/use of device. Safe driving practices reviewed. Goal O2 >88-90% Requalified for O2 today. 12/05/2023  SATURATION QUALIFICATIONS: (This note is used to comply with regulatory documentation for home oxygen )  Patient Saturations on Room Air at Rest = 94%  Patient Saturations on Room Air while Ambulating = 88%  Patient Saturations on 2-3 Liters of oxygen  via POC while Ambulating = 94%  Please briefly explain why patient needs home oxygen : Pt's O2 drops with ambulation

## 2023-12-05 NOTE — Patient Instructions (Addendum)
 Continue Albuterol  inhaler 2 puffs every 6 hours as needed for shortness of breath or wheezing. Notify if symptoms persist despite rescue inhaler/neb use.  Continue Asmanex  2 puffs daily. Brush tongue and rinse mouth afterwards Continue Stiolto 2 puffs daily Continue supplemental oxygen  2-3 lpm for goal >88-90% Continue BiPAP nightly Continue Ohtuvayre  nebulizer treatments 2.5 mL Twice daily    Repeat CT chest in September with oncology   Follow up in 4 months with Dr. Shelah or Izetta Malachy PIETY. If symptoms do not improve or worsen, please contact office for sooner follow up or seek emergency care.

## 2023-12-17 ENCOUNTER — Telehealth: Payer: Self-pay

## 2023-12-17 NOTE — Telephone Encounter (Signed)
 CMN received for PAP with supplies.  This has been faxed & received confirmation

## 2024-01-02 NOTE — Telephone Encounter (Signed)
 Cmn received from [facility] for oxygen .  CMN has been faxed & received confirmation.

## 2024-01-29 ENCOUNTER — Ambulatory Visit (HOSPITAL_COMMUNITY)
Admission: RE | Admit: 2024-01-29 | Discharge: 2024-01-29 | Disposition: A | Discharge status: Home / Self Care | Source: Ambulatory Visit | Attending: Radiation Oncology | Admitting: Radiation Oncology

## 2024-01-29 ENCOUNTER — Telehealth: Payer: Self-pay | Admitting: Radiation Oncology

## 2024-01-29 ENCOUNTER — Encounter (HOSPITAL_COMMUNITY): Payer: Self-pay

## 2024-01-29 ENCOUNTER — Encounter: Payer: Self-pay | Admitting: Radiation Oncology

## 2024-01-29 DIAGNOSIS — C3431 Malignant neoplasm of lower lobe, right bronchus or lung: Secondary | ICD-10-CM | POA: Diagnosis not present

## 2024-01-29 DIAGNOSIS — C3490 Malignant neoplasm of unspecified part of unspecified bronchus or lung: Secondary | ICD-10-CM | POA: Diagnosis not present

## 2024-01-29 DIAGNOSIS — I700 Atherosclerosis of aorta: Secondary | ICD-10-CM | POA: Diagnosis not present

## 2024-01-29 DIAGNOSIS — J432 Centrilobular emphysema: Secondary | ICD-10-CM | POA: Diagnosis not present

## 2024-01-29 LAB — POCT I-STAT CREATININE: Creatinine, Ser: 1 mg/dL (ref 0.61–1.24)

## 2024-01-29 MED ORDER — IOHEXOL 300 MG/ML  SOLN
75.0000 mL | Freq: Once | INTRAMUSCULAR | Status: AC | PRN
  Administered 2024-01-29: 75 mL via INTRAVENOUS

## 2024-01-29 NOTE — Telephone Encounter (Signed)
 I called the patient's stepdaughter and we reviewed the patient's CT imaging which has been reassuring. He is no longer complaining of pain along the chest wall from his prior rib fractures. We will cancel his appt on 9/29 given our discussion today. She is in agreement. She also requests a letter documenting his history of lung cancer. 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 We will otherwise plan a follow up in 6 months time with repeat CT imaging.

## 2024-01-30 ENCOUNTER — Ambulatory Visit (HOSPITAL_COMMUNITY)

## 2024-02-04 ENCOUNTER — Ambulatory Visit: Admitting: Radiation Oncology

## 2024-02-05 ENCOUNTER — Telehealth: Payer: Self-pay | Admitting: *Deleted

## 2024-02-05 ENCOUNTER — Telehealth: Payer: Self-pay | Admitting: Pharmacy Technician

## 2024-02-05 ENCOUNTER — Other Ambulatory Visit (HOSPITAL_COMMUNITY): Payer: Self-pay

## 2024-02-05 NOTE — Telephone Encounter (Signed)
 Pharmacy Patient Advocate Encounter  Received notification from [facility] that Prior Authorization for nexlizet  has been APPROVED from 02/05/24 to 05/21/24   PA #/Case ID/Reference #: [id number]

## 2024-02-05 NOTE — Telephone Encounter (Signed)
 Pharmacy Patient Advocate Encounter   Received notification from CoverMyMeds that prior authorization for Nexlizet  is required/requested.   Insurance verification completed.   The patient is insured through [employer] .   Per test claim: PA required; PA submitted to above mentioned insurance via Latent Key/confirmation #/EOC [id number] Status is pending

## 2024-02-05 NOTE — Telephone Encounter (Signed)
 Called patient to inform of Ct for 07-28-24- arrival time- 8:15 am @ [facility], no restrictions to scan, patient to receive results from Donald Husband on 08/11/24 @ 2:30 pm via telephone, spoke with patient's daughter- Montie Pizza and she is aware of these appts, and the instructions

## 2024-02-15 ENCOUNTER — Ambulatory Visit (HOSPITAL_BASED_OUTPATIENT_CLINIC_OR_DEPARTMENT_OTHER): Admitting: Family

## 2024-02-15 ENCOUNTER — Encounter (HOSPITAL_BASED_OUTPATIENT_CLINIC_OR_DEPARTMENT_OTHER): Payer: Self-pay | Admitting: Family

## 2024-02-15 VITALS — BP 138/72 | HR 56 | Ht 72.0 in | Wt 186.9 lb

## 2024-02-15 DIAGNOSIS — I2510 Atherosclerotic heart disease of native coronary artery without angina pectoris: Secondary | ICD-10-CM | POA: Diagnosis not present

## 2024-02-15 DIAGNOSIS — Z79899 Other long term (current) drug therapy: Secondary | ICD-10-CM

## 2024-02-15 DIAGNOSIS — E785 Hyperlipidemia, unspecified: Secondary | ICD-10-CM | POA: Diagnosis not present

## 2024-02-15 NOTE — Patient Instructions (Addendum)
 Medication Instructions:  Continue your current medications  *If you need a refill on your cardiac medications before your next appointment, please call your pharmacy*   Follow-Up: At [facility], you and your health needs are our priority.  As part of our continuing mission to provide you with exceptional heart care, our providers are all part of one team.  This team includes your primary Cardiologist (physician) and Advanced Practice Providers or APPs (Physician Assistants and Nurse Practitioners) who all work together to provide you with the care you need, when you need it.  Your next appointment:   6 month(s)  Provider:   Annabella Scarce, MD    We recommend signing up for the patient portal called MyChart.  Sign up information is provided on this After Visit Summary.  MyChart is used to connect with patients for Virtual Visits (Telemedicine).  Patients are able to view lab/test results, encounter notes, upcoming appointments, etc.  Non-urgent messages can be sent to your provider as well.   To learn more about what you can do with MyChart, go to [email or web address].   Other Instructions  We will send a note to Dr. [name] to check Digoxin  labs

## 2024-02-15 NOTE — Progress Notes (Signed)
 Cardiology Office Note   Date:  02/15/2024  ID:  Murad, Staples 1941/03/20, MRN [id number] PCP: Vernadine Charlie ORN, MD  [facility] Providers Cardiologist:  Annabella Scarce, MD     History of Present Illness Todd Lloyd is a 83 y.o. male with history of permanent atrial fibrillation, CAD, aortic atherosclerosis, mild ascending aortic aneurysm, HTN, HLD, severe COPD, leukemia s/p BMP 1990, prior tobacco use.   Screening chest CT 04/2016 aortic atherosclerosis and vessel coronary artery disease.  Echo 10/2018 LVEF 60 to 75%, ascending aorta 3.8 cm.  Pulmonary nodule noted 2021 which was hypermetabolic Bolick on PET.  Underwent SBRT and hospitalized for acute hypoxic respiratory failure and pneumonia 08/2020.  Trialed multiple statins in the past and did not tolerate.  He was last in 08/09/2023 by Dr. Scarce.  He was using BiPAP at night and oxygen  during the day.  Noted fatigue with activity.  Previous discontinuation of Lasix  led to fluid retention and increased breathing difficulty though had resolved with resumption of Lasix .  He was prescribed Nexis that for cholesterol control.  Updated echocardiogram 11/12/2023 normal LVEF 60 to 65%, mild LVH, RV normal function, mildly elevated PASP, bilateral atria dilated, mild AI.   Presents today for follow-up with his wife and daughter.edema well controlled on Lasix . Dyspnea is stable from previous, remains on home O2 and BIPAP. No chest pain, palpitations. He does note frequent urination and urge incontinence, known BPH and encouraged to discuss with PCP at upcoming visit. Has labs and OV with PCP in October.   ROS: Please see the history of present illness.    All other systems reviewed and are negative.   Studies Reviewed      Cardiac Studies & Procedures   ______________________________________________________________________________________________   STRESS TESTS  MYOCARDIAL PERFUSION IMAGING 04/01/2015    ECHOCARDIOGRAM  ECHOCARDIOGRAM COMPLETE 11/12/2023  Narrative ECHOCARDIOGRAM REPORT    Patient Name:   Todd Lloyd Date of Exam: 11/12/2023 Medical Rec #:  [id number]          Height:       72.0 in Accession #:    [id number]         Weight:       204.4 lb Date of Birth:  March 23, 1941          BSA:          2.150 m Patient Age:    82 years           BP:           110/60 mmHg Patient Gender: M                  HR:           58 bpm. Exam Location:  Outpatient  Procedure: 2D Echo, 3D Echo, Cardiac Doppler, Color Doppler and Strain Analysis (Both Spectral and Color Flow Doppler were utilized during procedure).  Indications:    Murmur  History:        Patient has prior history of Echocardiogram examinations, most recent 07/27/2020. CAD, COPD, Arrythmias:Atrial Fibrillation; Risk Factors:Hypertension and Former Smoker.  Sonographer:    Orvil Holmes RDCS Referring Phys: [id number] TIFFANY Winfield  IMPRESSIONS   1. Left ventricular ejection fraction, by estimation, is 60 to 65%. The left ventricle has normal function. The left ventricle has no regional wall motion abnormalities. There is mild concentric left ventricular hypertrophy. Left ventricular diastolic function could not be evaluated. The average left ventricular global longitudinal strain is -18.1 %.  The global longitudinal strain is normal. 2. Right ventricular systolic function is normal. The right ventricular size is mildly enlarged. There is mildly elevated pulmonary artery systolic pressure. 3. Left atrial size was moderately dilated. 4. Right atrial size was severely dilated. 5. The mitral valve is normal in structure. Trivial mitral valve regurgitation. No evidence of mitral stenosis. 6. The aortic valve is tricuspid. Aortic valve regurgitation is mild. No aortic stenosis is present. 7. The inferior vena cava is dilated in size with >50% respiratory variability, suggesting right atrial pressure of 8  mmHg.  FINDINGS Left Ventricle: Left ventricular ejection fraction, by estimation, is 60 to 65%. The left ventricle has normal function. The left ventricle has no regional wall motion abnormalities. The average left ventricular global longitudinal strain is -18.1 %. Strain was performed and the global longitudinal strain is normal. The left ventricular internal cavity size was normal in size. There is mild concentric left ventricular hypertrophy. Left ventricular diastolic function could not be evaluated due to atrial fibrillation. Left ventricular diastolic function could not be evaluated.  Right Ventricle: The right ventricular size is mildly enlarged. Right ventricular systolic function is normal. There is mildly elevated pulmonary artery systolic pressure. The tricuspid regurgitant velocity is 2.84 m/s, and with an assumed right atrial pressure of 8 mmHg, the estimated right ventricular systolic pressure is 40.3 mmHg.  Left Atrium: Left atrial size was moderately dilated.  Right Atrium: Right atrial size was severely dilated.  Pericardium: There is no evidence of pericardial effusion.  Mitral Valve: The mitral valve is normal in structure. Mild mitral annular calcification. Trivial mitral valve regurgitation. No evidence of mitral valve stenosis.  Tricuspid Valve: The tricuspid valve is normal in structure. Tricuspid valve regurgitation is mild . No evidence of tricuspid stenosis.  Aortic Valve: The aortic valve is tricuspid. Aortic valve regurgitation is mild. Aortic regurgitation PHT measures 479 msec. No aortic stenosis is present.  Pulmonic Valve: The pulmonic valve was normal in structure. Pulmonic valve regurgitation is trivial. No evidence of pulmonic stenosis.  Aorta: The aortic root is normal in size and structure.  Venous: The inferior vena cava is dilated in size with greater than 50% respiratory variability, suggesting right atrial pressure of 8 mmHg.  IAS/Shunts: No  atrial level shunt detected by color flow Doppler.  Additional Comments: 3D was performed not requiring image post processing on an independent workstation and was normal.   LEFT VENTRICLE PLAX 2D LVIDd:         4.78 cm   Diastology LVIDs:         2.86 cm   LV e' medial:    9.57 cm/s LV PW:         2.49 cm   LV E/e' medial:  11.1 LV IVS:        1.18 cm   LV e' lateral:   9.46 cm/s LVOT diam:     2.20 cm   LV E/e' lateral: 11.2 LV SV:         49 LV SV Index:   23        2D Longitudinal Strain LVOT Area:     3.80 cm  2D Strain GLS Avg:     -18.1 %  3D Volume EF: LV EDV:       148 ml LV ESV:       74 ml LV SV:        75 ml  RIGHT VENTRICLE RV Basal diam:  5.57 cm RV Mid diam:  4.58 cm RV S prime:     10.90 cm/s TAPSE (M-mode): 2.9 cm  LEFT ATRIUM           Index        RIGHT ATRIUM           Index LA diam:      3.90 cm 1.81 cm/m   RA Area:     31.00 cm LA Vol (A2C): 53.3 ml 24.79 ml/m  RA Volume:   113.00 ml 52.55 ml/m LA Vol (A4C): 74.0 ml 34.41 ml/m AORTIC VALVE LVOT Vmax:   74.40 cm/s LVOT Vmean:  47.200 cm/s LVOT VTI:    0.130 m AI PHT:      479 msec  AORTA Ao Root diam: 3.70 cm Ao Asc diam:  3.50 cm  MITRAL VALVE                TRICUSPID VALVE MV Area (PHT): 2.80 cm     TR Peak grad:   32.3 mmHg MV Decel Time: 271 msec     TR Vmax:        284.00 cm/s MV E velocity: 106.00 cm/s MV A velocity: 33.00 cm/s   SHUNTS MV E/A ratio:  3.21         Systemic VTI:  0.13 m Systemic Diam: 2.20 cm  Redell Shallow MD Electronically signed by Redell Shallow MD Signature Date/Time: 11/12/2023/12:43:08 PM    Final          ______________________________________________________________________________________________      Risk Assessment/Calculations  CHA2DS2-VASc Score = 4   This indicates a 4.8% annual risk of stroke. The patient's score is based upon: CHF History: 0 HTN History: 1 Diabetes History: 0 Stroke History: 0 Vascular Disease History: 1 Age  Score: 2 Gender Score: 0            Physical Exam VS:  BP 138/72   Pulse (!) 56   Wt 186 lb 14.4 oz (84.8 kg)   SpO2 92%   BMI 25.35 kg/m        Wt Readings from Last 3 Encounters:  02/15/24 186 lb 14.4 oz (84.8 kg)  12/05/23 187 lb 9.6 oz (85.1 kg)  08/13/23 204 lb 6.4 oz (92.7 kg)    GEN: Well nourished, well developed in no acute distress NECK: No JVD; No carotid bruits CARDIAC: IRIR, no murmurs, rubs, gallops RESPIRATORY:  Clear to auscultation without rales, wheezing or rhonchi  ABDOMEN: Soft, non-tender, non-distended EXTREMITIES:  No edema; No deformity   ASSESSMENT AND PLAN  Nonobstructive CAD / HLD, LDL goal <70 - Stable with no anginal symptoms. No indication for ischemic evaluation.  Statin intolerant. Continue Nexlizet  10-180mg  daily. Lipid panel upcoming with PCP, will request labs when completed.   Permanent atrial fib / Hypercoagulable state / On Digoxin  - unaware of atrial fib. Rate controlled. CHA2DS2-VASc Score = 4 [CHF History: 0, HTN History: 1, Diabetes History: 0, Stroke History: 0, Vascular Disease History: 1, Age Score: 2, Gender Score: 0].  Therefore, the patient's annual risk of stroke is 4.8 %.    Continue Eliquis  5mg  BID, does not meet dose reduction criteria. Request digoxin  level with upcoming labs with PCP q6 mos  COPD - no evidence of acute exacerbation. Management per PCP and pulmonology.  BPH - notes urinary frequency, encouraged to discuss with PCP.  LE edema - well managed on Lasix  10mg  and 20mg  every other day. Continue same.       Dispo: follow up in 6 mos with Dr.  Raford Fus, Reche GORMAN Finder, NP

## 2024-02-18 ENCOUNTER — Ambulatory Visit: Admitting: Radiation Oncology

## 2024-03-11 DIAGNOSIS — Z125 Encounter for screening for malignant neoplasm of prostate: Secondary | ICD-10-CM | POA: Diagnosis not present

## 2024-03-11 DIAGNOSIS — E7800 Pure hypercholesterolemia, unspecified: Secondary | ICD-10-CM | POA: Diagnosis not present

## 2024-03-11 DIAGNOSIS — E538 Deficiency of other specified B group vitamins: Secondary | ICD-10-CM | POA: Diagnosis not present

## 2024-03-11 DIAGNOSIS — I10 Essential (primary) hypertension: Secondary | ICD-10-CM | POA: Diagnosis not present

## 2024-03-11 DIAGNOSIS — Z79899 Other long term (current) drug therapy: Secondary | ICD-10-CM | POA: Diagnosis not present

## 2024-03-11 DIAGNOSIS — K219 Gastro-esophageal reflux disease without esophagitis: Secondary | ICD-10-CM | POA: Diagnosis not present

## 2024-03-18 DIAGNOSIS — F0390 Unspecified dementia without behavioral disturbance: Secondary | ICD-10-CM | POA: Diagnosis not present

## 2024-03-18 DIAGNOSIS — Z1339 Encounter for screening examination for other mental health and behavioral disorders: Secondary | ICD-10-CM | POA: Diagnosis not present

## 2024-03-18 DIAGNOSIS — I10 Essential (primary) hypertension: Secondary | ICD-10-CM | POA: Diagnosis not present

## 2024-03-18 DIAGNOSIS — J449 Chronic obstructive pulmonary disease, unspecified: Secondary | ICD-10-CM | POA: Diagnosis not present

## 2024-03-18 DIAGNOSIS — Z1331 Encounter for screening for depression: Secondary | ICD-10-CM | POA: Diagnosis not present

## 2024-03-18 DIAGNOSIS — C92Z1 Other myeloid leukemia, in remission: Secondary | ICD-10-CM | POA: Diagnosis not present

## 2024-03-18 DIAGNOSIS — I480 Paroxysmal atrial fibrillation: Secondary | ICD-10-CM | POA: Diagnosis not present

## 2024-03-18 DIAGNOSIS — T466X5A Adverse effect of antihyperlipidemic and antiarteriosclerotic drugs, initial encounter: Secondary | ICD-10-CM | POA: Diagnosis not present

## 2024-03-18 DIAGNOSIS — Z0000 Encounter for general adult medical examination without abnormal findings: Secondary | ICD-10-CM | POA: Diagnosis not present

## 2024-03-18 DIAGNOSIS — Z9981 Dependence on supplemental oxygen: Secondary | ICD-10-CM | POA: Diagnosis not present

## 2024-03-18 DIAGNOSIS — C3431 Malignant neoplasm of lower lobe, right bronchus or lung: Secondary | ICD-10-CM | POA: Diagnosis not present

## 2024-03-18 DIAGNOSIS — E7800 Pure hypercholesterolemia, unspecified: Secondary | ICD-10-CM | POA: Diagnosis not present

## 2024-03-18 DIAGNOSIS — Z23 Encounter for immunization: Secondary | ICD-10-CM | POA: Diagnosis not present

## 2024-03-18 DIAGNOSIS — N401 Enlarged prostate with lower urinary tract symptoms: Secondary | ICD-10-CM | POA: Diagnosis not present

## 2024-03-18 DIAGNOSIS — Z7901 Long term (current) use of anticoagulants: Secondary | ICD-10-CM | POA: Diagnosis not present

## 2024-03-18 DIAGNOSIS — R82998 Other abnormal findings in urine: Secondary | ICD-10-CM | POA: Diagnosis not present

## 2024-03-21 ENCOUNTER — Ambulatory Visit (INDEPENDENT_AMBULATORY_CARE_PROVIDER_SITE_OTHER): Admitting: Emergency Medicine

## 2024-03-21 ENCOUNTER — Encounter: Payer: Self-pay | Admitting: Emergency Medicine

## 2024-03-21 VITALS — BP 162/66 | HR 52 | Temp 97.4°F | Wt 188.3 lb

## 2024-03-21 DIAGNOSIS — J9611 Chronic respiratory failure with hypoxia: Secondary | ICD-10-CM | POA: Diagnosis not present

## 2024-03-21 DIAGNOSIS — J9612 Chronic respiratory failure with hypercapnia: Secondary | ICD-10-CM

## 2024-03-21 DIAGNOSIS — C3431 Malignant neoplasm of lower lobe, right bronchus or lung: Secondary | ICD-10-CM

## 2024-03-21 DIAGNOSIS — J449 Chronic obstructive pulmonary disease, unspecified: Secondary | ICD-10-CM

## 2024-03-21 NOTE — Assessment & Plan Note (Signed)
 Continue your oxygen  at 3 L/min pulsed flow or 2 L/min continuous flow when you exert yourself. Continue BiPAP nightly mandatory

## 2024-03-21 NOTE — Progress Notes (Signed)
 Subjective:    Patient ID: Todd Lloyd, male    DOB: March 27, 1941, 83 y.o.   MRN: [id number]  Shortness of Breath Associated symptoms include chest pain. Pertinent negatives include no ear pain, fever, headaches, leg swelling, rash, rhinorrhea, sore throat, vomiting or wheezing. His past medical history is significant for COPD.  COPD He complains of shortness of breath. There is no cough or wheezing. Associated symptoms include chest pain. Pertinent negatives include no ear pain, fever, headaches, postnasal drip, rhinorrhea, sneezing, sore throat or trouble swallowing. His past medical history is significant for COPD.     ROV 03/21/2024 --Todd Lloyd is 11 and follows up today for his severe COPD with chronic hypoxemic respiratory failure.  He is on oxygen  at 3 L/min as well as BiPAP at night.  He has also had empiric SBRT to a hypermetabolic 1.7 cm right lower lobe pulmonary nodule presumed to be non-small cell lung cancer.  We are managing him on Stiolto and Asmanex , added Ohtuvayre  in the last year. He believes that the Ohtuvayre  has helped him - he doesn't feel as congested. Less wheeze and cough. No flares - no abx or pred for over 2 yrs. He does sometimes forget, neglects to use his O2 at home or when going out. Flu shot up to date.   CT scan of the chest 01/29/2024 reviewed by me showed moderate centrilobular and paraseptal emphysema with some dense fibrotic change in the area of his treated lesion in the anterior right lower lobe.  No evidence of recurrence or new disease.   Review of Systems  Constitutional:  Negative for fever and unexpected weight change.  HENT:  Negative for congestion, dental problem, ear pain, nosebleeds, postnasal drip, rhinorrhea, sinus pressure, sneezing, sore throat and trouble swallowing.   Eyes:  Negative for redness and itching.  Respiratory:  Positive for shortness of breath. Negative for cough, chest tightness and wheezing.   Cardiovascular:  Positive for  chest pain. Negative for palpitations and leg swelling.  Gastrointestinal:  Negative for nausea and vomiting.  Genitourinary:  Negative for dysuria.  Musculoskeletal:  Negative for joint swelling.  Skin:  Negative for rash.  Neurological:  Negative for headaches.  Hematological:  Does not bruise/bleed easily.  Psychiatric/Behavioral:  Negative for dysphoric mood. The patient is not nervous/anxious.        Objective:   Physical Exam Vitals:   03/21/24 1009  BP: (!) 162/66  Pulse: (!) 52  Temp: (!) 97.4 F (36.3 C)  SpO2: 99%  Weight: 188 lb 4.8 oz (85.4 kg)   Gen: Pleasant, elderly man, in no distress,  normal affect, using a walker  ENT: No lesions,  mouth clear,  oropharynx clear, no postnasal drip  Neck: No JVD, no stridor  Lungs: No use of accessory muscles, distant, no wheeze on a normal breath.  Wheeze B on a forced exp.   Cardiovascular: RRR, heart sounds normal, no murmur or gallops, no peripheral edema  Musculoskeletal: No deformities, no cyanosis or clubbing  Neuro: alert, awake, interacting appropriately, well oriented  Skin: Warm, no lesions or rashes      Assessment & Plan:  COPD (chronic obstructive pulmonary disease) (HCC) Please continue your Stiolto and Asmanex  as you have been taking them.  Rinse and gargle after the Asmanex . Keep albuterol  available to use 2 puffs or 1 nebulizer treatment up to every 4 hours if needed for shortness of breath, chest tightness, wheezing.  Continue Ohtuvayre  2 times a day. Flu shot is up-to-date  Follow in our office in 6 months Follow Dr. Shelah in 12 months, sooner if any problems.  Malignant neoplasm of lower lobe of right lung [facility]) We reviewed your CT scan of the chest done in September.  This was stable.  Good news. Get your repeat CT scan of the chest in 6 months as directed by radiation oncology.  Chronic respiratory failure with hypoxia and hypercapnia (HCC) Continue your oxygen  at 3 L/min pulsed flow or 2  L/min continuous flow when you exert yourself. Continue BiPAP nightly mandatory    Lamar Shelah, MD, PhD 03/21/2024, 12:42 PM [facility] [phone] or if no answer [phone]

## 2024-03-21 NOTE — Assessment & Plan Note (Signed)
 We reviewed your CT scan of the chest done in September.  This was stable.  Good news. Get your repeat CT scan of the chest in 6 months as directed by radiation oncology.

## 2024-03-21 NOTE — Assessment & Plan Note (Signed)
 Please continue your Stiolto and Asmanex  as you have been taking them.  Rinse and gargle after the Asmanex . Keep albuterol  available to use 2 puffs or 1 nebulizer treatment up to every 4 hours if needed for shortness of breath, chest tightness, wheezing.  Continue Ohtuvayre  2 times a day. Flu shot is up-to-date Follow in our office in 6 months Follow Dr. Shelah in 12 months, sooner if any problems.

## 2024-03-21 NOTE — Patient Instructions (Addendum)
 Please continue your Stiolto and Asmanex  as you have been taking them.  Rinse and gargle after the Asmanex . Keep albuterol  available to use 2 puffs or 1 nebulizer treatment up to every 4 hours if needed for shortness of breath, chest tightness, wheezing.  Continue Ohtuvayre  2 times a day. Flu shot is up-to-date Continue your oxygen  at 3 L/min pulsed flow or 2 L/min continuous flow when you exert yourself. We reviewed your CT scan of the chest done in September.  This was stable.  Good news. Get your repeat CT scan of the chest in 6 months as directed by radiation oncology. Follow in our office in 6 months Follow Dr. Shelah in 12 months, sooner if any problems.

## 2024-04-28 ENCOUNTER — Telehealth: Payer: Self-pay | Admitting: Pharmacy Technician

## 2024-04-28 ENCOUNTER — Other Ambulatory Visit (HOSPITAL_COMMUNITY): Payer: Self-pay

## 2024-04-28 NOTE — Telephone Encounter (Signed)
 Last lipid panel 10/25 w PCP in Care Everywhere.  Would this be recent enough?

## 2024-04-28 NOTE — Telephone Encounter (Signed)
 Pharmacy Patient Advocate Encounter   Received notification from Onbase that prior authorization for NEXLIZET  is required/requested.   Insurance verification completed.   The patient is insured through [facility].   Per test claim: PA required; PA submitted to above mentioned insurance via Latent Key/confirmation #/EOC [id number] Status is pending

## 2024-04-28 NOTE — Telephone Encounter (Signed)
 Hi, insurance is asking for updated labs for the nexlizet  prior authorization. Can he please get updated lipid labs? Thank you

## 2024-04-28 NOTE — Telephone Encounter (Signed)
 Pharmacy Patient Advocate Encounter  Received notification from [facility] that Prior Authorization for nexlizet  has been APPROVED from 04/28/24 to 05/21/25   PA #/Case ID/Reference #: [id number]

## 2024-07-28 ENCOUNTER — Other Ambulatory Visit (HOSPITAL_COMMUNITY)

## 2024-07-28 ENCOUNTER — Ambulatory Visit (HOSPITAL_COMMUNITY)

## 2024-08-05 ENCOUNTER — Ambulatory Visit (HOSPITAL_BASED_OUTPATIENT_CLINIC_OR_DEPARTMENT_OTHER): Admitting: Cardiovascular Disease

## 2024-08-11 ENCOUNTER — Ambulatory Visit: Admitting: Radiation Oncology

## 2024-08-29 ENCOUNTER — Ambulatory Visit: Admitting: Emergency Medicine
